# Patient Record
Sex: Female | Born: 1960 | Race: White | Hispanic: No | Marital: Married | Smoking: Never smoker
Health system: Southern US, Community
[De-identification: ages and names within clinical notes are randomized; demographics above are authoritative.]

## PROBLEM LIST (undated history)

## (undated) DIAGNOSIS — D510 Vitamin B12 deficiency anemia due to intrinsic factor deficiency: Secondary | ICD-10-CM

## (undated) DIAGNOSIS — F419 Anxiety disorder, unspecified: Secondary | ICD-10-CM

## (undated) DIAGNOSIS — R400 Somnolence: Secondary | ICD-10-CM

## (undated) DIAGNOSIS — E871 Hypo-osmolality and hyponatremia: Secondary | ICD-10-CM

## (undated) DIAGNOSIS — R569 Unspecified convulsions: Secondary | ICD-10-CM

## (undated) DIAGNOSIS — E162 Hypoglycemia, unspecified: Secondary | ICD-10-CM

## (undated) DIAGNOSIS — G4730 Sleep apnea, unspecified: Secondary | ICD-10-CM

## (undated) DIAGNOSIS — M1990 Unspecified osteoarthritis, unspecified site: Secondary | ICD-10-CM

## (undated) DIAGNOSIS — J310 Chronic rhinitis: Secondary | ICD-10-CM

## (undated) DIAGNOSIS — F329 Major depressive disorder, single episode, unspecified: Secondary | ICD-10-CM

## (undated) DIAGNOSIS — R269 Unspecified abnormalities of gait and mobility: Secondary | ICD-10-CM

## (undated) DIAGNOSIS — R0600 Dyspnea, unspecified: Secondary | ICD-10-CM

## (undated) DIAGNOSIS — K219 Gastro-esophageal reflux disease without esophagitis: Secondary | ICD-10-CM

## (undated) DIAGNOSIS — G43019 Migraine without aura, intractable, without status migrainosus: Secondary | ICD-10-CM

## (undated) DIAGNOSIS — Z9884 Bariatric surgery status: Secondary | ICD-10-CM

## (undated) DIAGNOSIS — E559 Vitamin D deficiency, unspecified: Secondary | ICD-10-CM

## (undated) DIAGNOSIS — F32A Depression, unspecified: Secondary | ICD-10-CM

## (undated) DIAGNOSIS — D500 Iron deficiency anemia secondary to blood loss (chronic): Secondary | ICD-10-CM

## (undated) DIAGNOSIS — E6609 Other obesity due to excess calories: Secondary | ICD-10-CM

## (undated) DIAGNOSIS — J189 Pneumonia, unspecified organism: Secondary | ICD-10-CM

## (undated) DIAGNOSIS — M797 Fibromyalgia: Secondary | ICD-10-CM

## (undated) DIAGNOSIS — K922 Gastrointestinal hemorrhage, unspecified: Secondary | ICD-10-CM

## (undated) DIAGNOSIS — I959 Hypotension, unspecified: Secondary | ICD-10-CM

## (undated) DIAGNOSIS — D509 Iron deficiency anemia, unspecified: Secondary | ICD-10-CM

## (undated) DIAGNOSIS — Z8742 Personal history of other diseases of the female genital tract: Secondary | ICD-10-CM

## (undated) DIAGNOSIS — E039 Hypothyroidism, unspecified: Secondary | ICD-10-CM

## (undated) DIAGNOSIS — I8393 Asymptomatic varicose veins of bilateral lower extremities: Secondary | ICD-10-CM

## (undated) HISTORY — PX: GASTRIC BYPASS: SHX52

## (undated) HISTORY — PX: DILATION AND CURETTAGE OF UTERUS: SHX78

## (undated) HISTORY — DX: Sleep apnea, unspecified: G47.30

## (undated) HISTORY — DX: Unspecified abnormalities of gait and mobility: R26.9

## (undated) HISTORY — DX: Unspecified osteoarthritis, unspecified site: M19.90

## (undated) HISTORY — DX: Somnolence: R40.0

## (undated) HISTORY — PX: CHOLECYSTECTOMY: SHX55

## (undated) HISTORY — DX: Hypothyroidism, unspecified: E03.9

## (undated) HISTORY — PX: MANDIBLE RECONSTRUCTION: SHX431

## (undated) HISTORY — DX: Migraine without aura, intractable, without status migrainosus: G43.019

## (undated) HISTORY — DX: Iron deficiency anemia secondary to blood loss (chronic): D50.0

## (undated) HISTORY — PX: TONSILLECTOMY: SUR1361

## (undated) HISTORY — PX: NASAL SINUS SURGERY: SHX719

## (undated) HISTORY — PX: SPINAL FUSION: SHX223

## (undated) HISTORY — PX: UVULECTOMY: SHX2631

## (undated) HISTORY — DX: Chronic rhinitis: J31.0

## (undated) HISTORY — DX: Fibromyalgia: M79.7

## (undated) HISTORY — DX: Iron deficiency anemia, unspecified: D50.9

## (undated) HISTORY — PX: TRACHEOSTOMY: SUR1362

## (undated) HISTORY — DX: Vitamin B12 deficiency anemia due to intrinsic factor deficiency: D51.0

## (undated) HISTORY — DX: Other obesity due to excess calories: E66.09

---

## 1998-08-05 ENCOUNTER — Ambulatory Visit (HOSPITAL_COMMUNITY): Admission: RE | Admit: 1998-08-05 | Discharge: 1998-08-05 | Payer: Self-pay | Admitting: *Deleted

## 1998-08-05 ENCOUNTER — Encounter (INDEPENDENT_AMBULATORY_CARE_PROVIDER_SITE_OTHER): Payer: Self-pay | Admitting: Specialist

## 1998-08-26 ENCOUNTER — Encounter: Payer: Self-pay | Admitting: Pulmonary Disease

## 1998-08-26 ENCOUNTER — Ambulatory Visit: Admission: RE | Admit: 1998-08-26 | Discharge: 1998-08-26 | Payer: Self-pay | Admitting: Pulmonary Disease

## 1999-02-18 ENCOUNTER — Encounter: Payer: Self-pay | Admitting: *Deleted

## 1999-02-19 ENCOUNTER — Inpatient Hospital Stay (HOSPITAL_COMMUNITY): Admission: RE | Admit: 1999-02-19 | Discharge: 1999-02-23 | Payer: Self-pay | Admitting: *Deleted

## 2000-01-02 ENCOUNTER — Emergency Department (HOSPITAL_COMMUNITY): Admission: EM | Admit: 2000-01-02 | Discharge: 2000-01-02 | Payer: Self-pay | Admitting: Emergency Medicine

## 2000-01-02 ENCOUNTER — Encounter: Payer: Self-pay | Admitting: Emergency Medicine

## 2000-03-28 ENCOUNTER — Encounter: Payer: Self-pay | Admitting: Otolaryngology

## 2000-03-30 ENCOUNTER — Inpatient Hospital Stay (HOSPITAL_COMMUNITY): Admission: RE | Admit: 2000-03-30 | Discharge: 2000-04-05 | Payer: Self-pay | Admitting: Otolaryngology

## 2001-05-04 ENCOUNTER — Ambulatory Visit (HOSPITAL_BASED_OUTPATIENT_CLINIC_OR_DEPARTMENT_OTHER): Admission: RE | Admit: 2001-05-04 | Discharge: 2001-05-04 | Payer: Self-pay | Admitting: Internal Medicine

## 2001-05-27 ENCOUNTER — Emergency Department (HOSPITAL_COMMUNITY): Admission: EM | Admit: 2001-05-27 | Discharge: 2001-05-27 | Payer: Self-pay | Admitting: Emergency Medicine

## 2001-09-18 ENCOUNTER — Ambulatory Visit (HOSPITAL_COMMUNITY): Admission: RE | Admit: 2001-09-18 | Discharge: 2001-09-18 | Payer: Self-pay | Admitting: Gastroenterology

## 2001-10-24 ENCOUNTER — Ambulatory Visit (HOSPITAL_COMMUNITY): Admission: RE | Admit: 2001-10-24 | Discharge: 2001-10-24 | Payer: Self-pay | Admitting: Gastroenterology

## 2002-05-22 ENCOUNTER — Encounter: Admission: RE | Admit: 2002-05-22 | Discharge: 2002-05-22 | Payer: Self-pay | Admitting: Rheumatology

## 2002-05-22 ENCOUNTER — Encounter: Payer: Self-pay | Admitting: Rheumatology

## 2002-07-22 ENCOUNTER — Ambulatory Visit (HOSPITAL_BASED_OUTPATIENT_CLINIC_OR_DEPARTMENT_OTHER): Admission: RE | Admit: 2002-07-22 | Discharge: 2002-07-22 | Payer: Self-pay | Admitting: Internal Medicine

## 2003-06-22 ENCOUNTER — Emergency Department (HOSPITAL_COMMUNITY): Admission: EM | Admit: 2003-06-22 | Discharge: 2003-06-22 | Payer: Self-pay | Admitting: Emergency Medicine

## 2003-08-04 ENCOUNTER — Ambulatory Visit (HOSPITAL_COMMUNITY): Admission: RE | Admit: 2003-08-04 | Discharge: 2003-08-04 | Payer: Self-pay | Admitting: Gastroenterology

## 2003-09-02 ENCOUNTER — Other Ambulatory Visit: Admission: RE | Admit: 2003-09-02 | Discharge: 2003-09-02 | Payer: Self-pay | Admitting: Family Medicine

## 2003-09-09 ENCOUNTER — Ambulatory Visit (HOSPITAL_COMMUNITY): Admission: RE | Admit: 2003-09-09 | Discharge: 2003-09-09 | Payer: Self-pay | Admitting: Family Medicine

## 2003-10-06 ENCOUNTER — Ambulatory Visit (HOSPITAL_COMMUNITY): Admission: RE | Admit: 2003-10-06 | Discharge: 2003-10-06 | Payer: Self-pay | Admitting: Gastroenterology

## 2003-11-03 ENCOUNTER — Emergency Department (HOSPITAL_COMMUNITY): Admission: EM | Admit: 2003-11-03 | Discharge: 2003-11-03 | Payer: Self-pay | Admitting: Emergency Medicine

## 2003-11-27 ENCOUNTER — Encounter: Admission: RE | Admit: 2003-11-27 | Discharge: 2003-11-27 | Payer: Self-pay | Admitting: Family Medicine

## 2003-11-28 ENCOUNTER — Ambulatory Visit: Payer: Self-pay | Admitting: Hematology & Oncology

## 2004-01-16 ENCOUNTER — Ambulatory Visit: Payer: Self-pay | Admitting: Hematology & Oncology

## 2004-03-08 ENCOUNTER — Ambulatory Visit: Payer: Self-pay | Admitting: Internal Medicine

## 2004-03-16 ENCOUNTER — Ambulatory Visit: Payer: Self-pay | Admitting: Hematology & Oncology

## 2004-05-03 ENCOUNTER — Ambulatory Visit: Payer: Self-pay | Admitting: Hematology & Oncology

## 2004-08-09 ENCOUNTER — Ambulatory Visit: Payer: Self-pay | Admitting: Internal Medicine

## 2004-08-17 ENCOUNTER — Ambulatory Visit: Payer: Self-pay | Admitting: Hematology & Oncology

## 2004-09-15 ENCOUNTER — Ambulatory Visit: Payer: Self-pay | Admitting: Internal Medicine

## 2004-11-16 ENCOUNTER — Ambulatory Visit: Payer: Self-pay | Admitting: Hematology & Oncology

## 2004-12-23 ENCOUNTER — Ambulatory Visit: Payer: Self-pay | Admitting: Internal Medicine

## 2005-01-25 ENCOUNTER — Ambulatory Visit: Payer: Self-pay | Admitting: Internal Medicine

## 2005-02-15 ENCOUNTER — Ambulatory Visit: Payer: Self-pay | Admitting: Hematology & Oncology

## 2005-02-24 ENCOUNTER — Ambulatory Visit: Payer: Self-pay | Admitting: Internal Medicine

## 2005-04-28 ENCOUNTER — Encounter: Admission: RE | Admit: 2005-04-28 | Discharge: 2005-04-28 | Payer: Self-pay | Admitting: Orthopedic Surgery

## 2005-05-16 ENCOUNTER — Ambulatory Visit: Payer: Self-pay | Admitting: Hematology & Oncology

## 2005-05-18 LAB — CBC WITH DIFFERENTIAL/PLATELET
Basophils Absolute: 0 10*3/uL (ref 0.0–0.1)
Eosinophils Absolute: 0.1 10*3/uL (ref 0.0–0.5)
HGB: 12.4 g/dL (ref 11.6–15.9)
MCV: 89.7 fL (ref 81.0–101.0)
NEUT#: 3.3 10*3/uL (ref 1.5–6.5)
RDW: 14.9 % — ABNORMAL HIGH (ref 11.3–14.5)
lymph#: 2.3 10*3/uL (ref 0.9–3.3)

## 2005-05-20 ENCOUNTER — Inpatient Hospital Stay (HOSPITAL_COMMUNITY): Admission: RE | Admit: 2005-05-20 | Discharge: 2005-05-23 | Payer: Self-pay | Admitting: Neurosurgery

## 2005-05-20 LAB — TRANSFERRIN RECEPTOR, SOLUABLE: Transferrin Receptor, Soluble: 4.5 mg/L — ABNORMAL HIGH (ref 1.9–4.4)

## 2005-05-24 ENCOUNTER — Ambulatory Visit (HOSPITAL_COMMUNITY): Admission: RE | Admit: 2005-05-24 | Discharge: 2005-05-24 | Payer: Self-pay | Admitting: Neurosurgery

## 2005-07-11 ENCOUNTER — Encounter: Admission: RE | Admit: 2005-07-11 | Discharge: 2005-07-11 | Payer: Self-pay | Admitting: Otolaryngology

## 2005-07-20 ENCOUNTER — Ambulatory Visit: Payer: Self-pay | Admitting: Hematology & Oncology

## 2005-08-17 LAB — CBC WITH DIFFERENTIAL/PLATELET
Basophils Absolute: 0 10*3/uL (ref 0.0–0.1)
Eosinophils Absolute: 0 10*3/uL (ref 0.0–0.5)
HGB: 13 g/dL (ref 11.6–15.9)
MONO#: 0.3 10*3/uL (ref 0.1–0.9)
NEUT#: 7.4 10*3/uL — ABNORMAL HIGH (ref 1.5–6.5)
RDW: 22.1 % — ABNORMAL HIGH (ref 11.3–14.5)
lymph#: 1.2 10*3/uL (ref 0.9–3.3)

## 2005-08-20 LAB — FERRITIN: Ferritin: 200 ng/mL (ref 10–291)

## 2005-10-06 ENCOUNTER — Emergency Department (HOSPITAL_COMMUNITY): Admission: EM | Admit: 2005-10-06 | Discharge: 2005-10-07 | Payer: Self-pay | Admitting: Emergency Medicine

## 2005-11-16 ENCOUNTER — Ambulatory Visit: Payer: Self-pay | Admitting: Hematology & Oncology

## 2005-11-23 ENCOUNTER — Encounter: Admission: RE | Admit: 2005-11-23 | Discharge: 2005-11-23 | Payer: Self-pay | Admitting: Orthopedic Surgery

## 2005-12-05 LAB — CBC & DIFF AND RETIC
Basophils Absolute: 0.1 10*3/uL (ref 0.0–0.1)
Eosinophils Absolute: 0.1 10*3/uL (ref 0.0–0.5)
HGB: 13 g/dL (ref 11.6–15.9)
LYMPH%: 24.2 % (ref 14.0–48.0)
MCH: 30.2 pg (ref 26.0–34.0)
MCV: 90.9 fL (ref 81.0–101.0)
MONO%: 5.6 % (ref 0.0–13.0)
NEUT#: 4.9 10*3/uL (ref 1.5–6.5)
Platelets: 287 10*3/uL (ref 145–400)
RBC: 4.3 10*6/uL (ref 3.70–5.32)

## 2005-12-07 LAB — FERRITIN: Ferritin: 12 ng/mL (ref 10–291)

## 2005-12-07 LAB — TRANSFERRIN RECEPTOR, SOLUABLE: Transferrin Receptor, Soluble: 4.6 mg/L — ABNORMAL HIGH (ref 1.9–4.4)

## 2005-12-14 ENCOUNTER — Ambulatory Visit: Payer: Self-pay | Admitting: Internal Medicine

## 2005-12-16 ENCOUNTER — Encounter: Admission: RE | Admit: 2005-12-16 | Discharge: 2005-12-16 | Payer: Self-pay | Admitting: Orthopedic Surgery

## 2005-12-22 ENCOUNTER — Inpatient Hospital Stay (HOSPITAL_COMMUNITY): Admission: EM | Admit: 2005-12-22 | Discharge: 2005-12-26 | Payer: Self-pay | Admitting: Internal Medicine

## 2005-12-22 ENCOUNTER — Encounter: Payer: Self-pay | Admitting: Emergency Medicine

## 2005-12-28 ENCOUNTER — Ambulatory Visit (HOSPITAL_COMMUNITY): Admission: RE | Admit: 2005-12-28 | Discharge: 2005-12-28 | Payer: Self-pay | Admitting: Gastroenterology

## 2006-01-04 ENCOUNTER — Inpatient Hospital Stay (HOSPITAL_COMMUNITY): Admission: EM | Admit: 2006-01-04 | Discharge: 2006-01-05 | Payer: Self-pay | Admitting: Emergency Medicine

## 2006-03-06 ENCOUNTER — Ambulatory Visit: Payer: Self-pay | Admitting: Hematology & Oncology

## 2006-04-06 LAB — CBC WITH DIFFERENTIAL/PLATELET
BASO%: 0 % (ref 0.0–2.0)
EOS%: 0 % (ref 0.0–7.0)
HCT: 32.2 % — ABNORMAL LOW (ref 34.8–46.6)
LYMPH%: 9.1 % — ABNORMAL LOW (ref 14.0–48.0)
MCH: 23.2 pg — ABNORMAL LOW (ref 26.0–34.0)
MCHC: 31.6 g/dL — ABNORMAL LOW (ref 32.0–36.0)
MONO#: 0.2 10*3/uL (ref 0.1–0.9)
NEUT%: 88.3 % — ABNORMAL HIGH (ref 39.6–76.8)
Platelets: 338 10*3/uL (ref 145–400)
RBC: 4.38 10*6/uL (ref 3.70–5.32)
WBC: 8.6 10*3/uL (ref 3.9–10.0)

## 2006-04-06 LAB — CHCC SMEAR

## 2006-05-05 ENCOUNTER — Encounter
Admission: RE | Admit: 2006-05-05 | Discharge: 2006-08-03 | Payer: Self-pay | Admitting: Physical Medicine & Rehabilitation

## 2006-05-09 ENCOUNTER — Ambulatory Visit: Payer: Self-pay | Admitting: Physical Medicine & Rehabilitation

## 2006-05-23 ENCOUNTER — Ambulatory Visit: Payer: Self-pay | Admitting: Hematology & Oncology

## 2006-05-25 LAB — CBC WITH DIFFERENTIAL/PLATELET
BASO%: 0.3 % (ref 0.0–2.0)
EOS%: 1.5 % (ref 0.0–7.0)
HCT: 36.4 % (ref 34.8–46.6)
LYMPH%: 18.9 % (ref 14.0–48.0)
MCH: 26.6 pg (ref 26.0–34.0)
MCHC: 32.5 g/dL (ref 32.0–36.0)
MCV: 82 fL (ref 81.0–101.0)
MONO#: 0.3 10*3/uL (ref 0.1–0.9)
MONO%: 4.1 % (ref 0.0–13.0)
NEUT%: 75.2 % (ref 39.6–76.8)
Platelets: 203 10*3/uL (ref 145–400)
RBC: 4.45 10*6/uL (ref 3.70–5.32)

## 2006-07-01 ENCOUNTER — Emergency Department (HOSPITAL_COMMUNITY): Admission: EM | Admit: 2006-07-01 | Discharge: 2006-07-01 | Payer: Self-pay | Admitting: Emergency Medicine

## 2006-08-22 ENCOUNTER — Ambulatory Visit: Payer: Self-pay | Admitting: Hematology & Oncology

## 2006-11-02 ENCOUNTER — Ambulatory Visit: Payer: Self-pay | Admitting: Internal Medicine

## 2007-02-06 ENCOUNTER — Encounter: Payer: Self-pay | Admitting: Internal Medicine

## 2007-10-22 ENCOUNTER — Telehealth (INDEPENDENT_AMBULATORY_CARE_PROVIDER_SITE_OTHER): Payer: Self-pay | Admitting: *Deleted

## 2007-11-12 ENCOUNTER — Ambulatory Visit: Payer: Self-pay | Admitting: Hematology & Oncology

## 2007-11-13 LAB — CBC WITH DIFFERENTIAL (CANCER CENTER ONLY)
BASO#: 0 10*3/uL (ref 0.0–0.2)
BASO%: 0.3 % (ref 0.0–2.0)
EOS%: 2.1 % (ref 0.0–7.0)
HCT: 30.7 % — ABNORMAL LOW (ref 34.8–46.6)
HGB: 10.2 g/dL — ABNORMAL LOW (ref 11.6–15.9)
MCH: 26.7 pg (ref 26.0–34.0)
MCHC: 33.2 g/dL (ref 32.0–36.0)
MONO%: 6.5 % (ref 0.0–13.0)
NEUT%: 60.4 % (ref 39.6–80.0)
RDW: 15 % — ABNORMAL HIGH (ref 10.5–14.6)

## 2007-11-13 LAB — RETICULOCYTES (CHCC): ABS Retic: 35.4 10*3/uL (ref 19.0–186.0)

## 2008-01-10 ENCOUNTER — Ambulatory Visit: Payer: Self-pay | Admitting: Internal Medicine

## 2008-01-10 DIAGNOSIS — G4733 Obstructive sleep apnea (adult) (pediatric): Secondary | ICD-10-CM

## 2008-01-10 DIAGNOSIS — E039 Hypothyroidism, unspecified: Secondary | ICD-10-CM

## 2008-01-10 DIAGNOSIS — E162 Hypoglycemia, unspecified: Secondary | ICD-10-CM

## 2008-01-10 DIAGNOSIS — R404 Transient alteration of awareness: Secondary | ICD-10-CM

## 2008-01-10 DIAGNOSIS — E669 Obesity, unspecified: Secondary | ICD-10-CM

## 2008-01-10 DIAGNOSIS — J310 Chronic rhinitis: Secondary | ICD-10-CM | POA: Insufficient documentation

## 2008-01-10 DIAGNOSIS — M797 Fibromyalgia: Secondary | ICD-10-CM | POA: Insufficient documentation

## 2008-01-10 DIAGNOSIS — M129 Arthropathy, unspecified: Secondary | ICD-10-CM

## 2008-01-23 ENCOUNTER — Ambulatory Visit: Payer: Self-pay | Admitting: Hematology & Oncology

## 2008-02-06 LAB — CBC WITH DIFFERENTIAL (CANCER CENTER ONLY)
BASO#: 0 10*3/uL (ref 0.0–0.2)
EOS%: 1.4 % (ref 0.0–7.0)
HCT: 37.4 % (ref 34.8–46.6)
HGB: 12.3 g/dL (ref 11.6–15.9)
LYMPH#: 1.4 10*3/uL (ref 0.9–3.3)
LYMPH%: 19.6 % (ref 14.0–48.0)
MCHC: 32.8 g/dL (ref 32.0–36.0)
MCV: 92 fL (ref 81–101)
MONO#: 0.3 10*3/uL (ref 0.1–0.9)
NEUT%: 74.8 % (ref 39.6–80.0)
RDW: 13.8 % (ref 10.5–14.6)

## 2008-02-12 ENCOUNTER — Other Ambulatory Visit: Admission: RE | Admit: 2008-02-12 | Discharge: 2008-02-12 | Payer: Self-pay | Admitting: Family Medicine

## 2008-04-15 ENCOUNTER — Ambulatory Visit: Payer: Self-pay | Admitting: Hematology & Oncology

## 2008-06-23 ENCOUNTER — Encounter: Admission: RE | Admit: 2008-06-23 | Discharge: 2008-06-23 | Payer: Self-pay | Admitting: Orthopaedic Surgery

## 2008-07-23 ENCOUNTER — Ambulatory Visit: Payer: Self-pay | Admitting: Hematology & Oncology

## 2008-07-24 LAB — RETICULOCYTES (CHCC)
ABS Retic: 43.2 10*3/uL (ref 19.0–186.0)
RBC.: 3.6 MIL/uL — ABNORMAL LOW (ref 3.87–5.11)

## 2008-07-24 LAB — CBC WITH DIFFERENTIAL (CANCER CENTER ONLY)
BASO#: 0 10*3/uL (ref 0.0–0.2)
BASO%: 0.3 % (ref 0.0–2.0)
Eosinophils Absolute: 0.1 10*3/uL (ref 0.0–0.5)
HCT: 30.9 % — ABNORMAL LOW (ref 34.8–46.6)
HGB: 9.9 g/dL — ABNORMAL LOW (ref 11.6–15.9)
LYMPH#: 2.1 10*3/uL (ref 0.9–3.3)
LYMPH%: 33.9 % (ref 14.0–48.0)
MCV: 84 fL (ref 81–101)
MONO#: 0.3 10*3/uL (ref 0.1–0.9)
NEUT%: 59.3 % (ref 39.6–80.0)
RBC: 3.69 10*6/uL — ABNORMAL LOW (ref 3.70–5.32)
RDW: 16.3 % — ABNORMAL HIGH (ref 10.5–14.6)
WBC: 6.3 10*3/uL (ref 3.9–10.0)

## 2008-07-24 LAB — CHCC SATELLITE - SMEAR

## 2008-08-01 ENCOUNTER — Emergency Department (HOSPITAL_COMMUNITY): Admission: EM | Admit: 2008-08-01 | Discharge: 2008-08-01 | Payer: Self-pay | Admitting: Emergency Medicine

## 2008-08-22 ENCOUNTER — Ambulatory Visit: Payer: Self-pay | Admitting: Hematology & Oncology

## 2008-08-25 LAB — CBC WITH DIFFERENTIAL (CANCER CENTER ONLY)
BASO%: 0.4 % (ref 0.0–2.0)
Eosinophils Absolute: 0.1 10*3/uL (ref 0.0–0.5)
LYMPH#: 1.8 10*3/uL (ref 0.9–3.3)
LYMPH%: 33.8 % (ref 14.0–48.0)
MCV: 90 fL (ref 81–101)
MONO#: 0.3 10*3/uL (ref 0.1–0.9)
NEUT#: 3.1 10*3/uL (ref 1.5–6.5)
Platelets: 165 10*3/uL (ref 145–400)
RBC: 3.86 10*6/uL (ref 3.70–5.32)
RDW: 17.4 % — ABNORMAL HIGH (ref 10.5–14.6)
WBC: 5.4 10*3/uL (ref 3.9–10.0)

## 2008-08-25 LAB — CHCC SATELLITE - SMEAR

## 2008-08-25 LAB — FERRITIN: Ferritin: 58 ng/mL (ref 10–291)

## 2008-10-03 ENCOUNTER — Ambulatory Visit: Payer: Self-pay | Admitting: Hematology & Oncology

## 2008-10-06 LAB — CBC WITH DIFFERENTIAL (CANCER CENTER ONLY)
BASO%: 0.4 % (ref 0.0–2.0)
Eosinophils Absolute: 0.1 10*3/uL (ref 0.0–0.5)
LYMPH%: 34.7 % (ref 14.0–48.0)
MCH: 29.9 pg (ref 26.0–34.0)
MCV: 89 fL (ref 81–101)
MONO%: 6.1 % (ref 0.0–13.0)
NEUT#: 1.9 10*3/uL (ref 1.5–6.5)
Platelets: 122 10*3/uL — ABNORMAL LOW (ref 145–400)
RBC: 4.04 10*6/uL (ref 3.70–5.32)
RDW: 13.3 % (ref 10.5–14.6)
WBC: 3.4 10*3/uL — ABNORMAL LOW (ref 3.9–10.0)

## 2008-10-06 LAB — RETICULOCYTES (CHCC)
RBC.: 4.15 MIL/uL (ref 3.87–5.11)
Retic Ct Pct: 0.6 % (ref 0.4–3.1)

## 2008-11-03 ENCOUNTER — Ambulatory Visit: Payer: Self-pay | Admitting: Hematology & Oncology

## 2009-06-25 ENCOUNTER — Ambulatory Visit: Payer: Self-pay | Admitting: Hematology & Oncology

## 2009-08-07 ENCOUNTER — Ambulatory Visit: Payer: Self-pay | Admitting: Hematology & Oncology

## 2009-08-10 LAB — CBC WITH DIFFERENTIAL (CANCER CENTER ONLY)
BASO#: 0 10*3/uL (ref 0.0–0.2)
Eosinophils Absolute: 0.1 10*3/uL (ref 0.0–0.5)
HCT: 30.9 % — ABNORMAL LOW (ref 34.8–46.6)
HGB: 10.2 g/dL — ABNORMAL LOW (ref 11.6–15.9)
LYMPH%: 30.5 % (ref 14.0–48.0)
MCH: 26.5 pg (ref 26.0–34.0)
MCV: 80 fL — ABNORMAL LOW (ref 81–101)
MONO#: 0.2 10*3/uL (ref 0.1–0.9)
MONO%: 5.6 % (ref 0.0–13.0)
NEUT%: 60.5 % (ref 39.6–80.0)
RBC: 3.85 10*6/uL (ref 3.70–5.32)
WBC: 4.2 10*3/uL (ref 3.9–10.0)

## 2009-08-10 LAB — RETICULOCYTES (CHCC)
ABS Retic: 11.7 10*3/uL — ABNORMAL LOW (ref 19.0–186.0)
RBC.: 3.89 MIL/uL (ref 3.87–5.11)

## 2009-10-01 ENCOUNTER — Ambulatory Visit: Payer: Self-pay | Admitting: Hematology & Oncology

## 2009-10-02 LAB — CBC WITH DIFFERENTIAL (CANCER CENTER ONLY)
BASO#: 0 10*3/uL (ref 0.0–0.2)
BASO%: 0.3 % (ref 0.0–2.0)
EOS%: 3.1 % (ref 0.0–7.0)
Eosinophils Absolute: 0.1 10*3/uL (ref 0.0–0.5)
HCT: 33.1 % — ABNORMAL LOW (ref 34.8–46.6)
HGB: 10.7 g/dL — ABNORMAL LOW (ref 11.6–15.9)
LYMPH#: 1.3 10*3/uL (ref 0.9–3.3)
LYMPH%: 34 % (ref 14.0–48.0)
MCH: 28.4 pg (ref 26.0–34.0)
MCHC: 32.3 g/dL (ref 32.0–36.0)
MCV: 88 fL (ref 81–101)
MONO#: 0.2 10*3/uL (ref 0.1–0.9)
MONO%: 5.2 % (ref 0.0–13.0)
NEUT#: 2.2 10*3/uL (ref 1.5–6.5)
NEUT%: 57.4 % (ref 39.6–80.0)
Platelets: 162 10*3/uL (ref 145–400)
RBC: 3.77 10*6/uL (ref 3.70–5.32)
RDW: 16.6 % — ABNORMAL HIGH (ref 10.5–14.6)
WBC: 3.9 10*3/uL (ref 3.9–10.0)

## 2009-10-02 LAB — RETICULOCYTES (CHCC)
ABS Retic: 22.7 10*3/uL (ref 19.0–186.0)
RBC.: 3.79 MIL/uL — ABNORMAL LOW (ref 3.87–5.11)
Retic Ct Pct: 0.6 % (ref 0.4–3.1)

## 2009-10-02 LAB — CHCC SATELLITE - SMEAR

## 2009-11-11 ENCOUNTER — Ambulatory Visit (HOSPITAL_BASED_OUTPATIENT_CLINIC_OR_DEPARTMENT_OTHER): Payer: Federal, State, Local not specified - PPO | Admitting: Hematology & Oncology

## 2009-11-12 LAB — CBC WITH DIFFERENTIAL (CANCER CENTER ONLY)
BASO#: 0 10*3/uL (ref 0.0–0.2)
BASO%: 0.2 % (ref 0.0–2.0)
EOS%: 1.4 % (ref 0.0–7.0)
HCT: 35.9 % (ref 34.8–46.6)
HGB: 11.7 g/dL (ref 11.6–15.9)
LYMPH#: 0.9 10*3/uL (ref 0.9–3.3)
MCHC: 32.6 g/dL (ref 32.0–36.0)
MONO#: 0.2 10*3/uL (ref 0.1–0.9)
NEUT#: 3.5 10*3/uL (ref 1.5–6.5)
NEUT%: 75.7 % (ref 39.6–80.0)
WBC: 4.6 10*3/uL (ref 3.9–10.0)

## 2009-11-12 LAB — VITAMIN B12: Vitamin B-12: >2000 pg/mL — ABNORMAL HIGH (ref 211–911)

## 2010-01-31 ENCOUNTER — Encounter: Payer: Self-pay | Admitting: Orthopedic Surgery

## 2010-01-31 ENCOUNTER — Encounter: Payer: Self-pay | Admitting: Gastroenterology

## 2010-03-18 ENCOUNTER — Encounter (HOSPITAL_BASED_OUTPATIENT_CLINIC_OR_DEPARTMENT_OTHER): Payer: Federal, State, Local not specified - PPO | Admitting: Hematology & Oncology

## 2010-03-18 DIAGNOSIS — D509 Iron deficiency anemia, unspecified: Secondary | ICD-10-CM

## 2010-03-18 LAB — RETICULOCYTES (CHCC)
RBC.: 4.07 MIL/uL (ref 3.87–5.11)
Retic Ct Pct: 0.7 % (ref 0.4–3.1)

## 2010-03-18 LAB — VITAMIN B12: Vitamin B-12: 370 pg/mL (ref 211–911)

## 2010-03-18 LAB — CBC WITH DIFFERENTIAL (CANCER CENTER ONLY)
BASO%: 0.8 % (ref 0.0–2.0)
EOS%: 2.3 % (ref 0.0–7.0)
LYMPH#: 1.4 10*3/uL (ref 0.9–3.3)
MONO#: 0.3 10*3/uL (ref 0.1–0.9)
Platelets: 132 10*3/uL — ABNORMAL LOW (ref 145–400)
RDW: 12.8 % (ref 11.1–15.7)
WBC: 3.8 10*3/uL — ABNORMAL LOW (ref 3.9–10.0)

## 2010-05-25 NOTE — Procedures (Signed)
Valerie Silva, Valerie Silva NO.:  [id number]   MEDICAL RECORD NO.:  [id number]          PATIENT TYPE:  REC   LOCATION:  TPC                          FACILITY:  [facility]   PHYSICIAN:  Erick Colace, M.D.DATE OF BIRTH:  04-04-1960   DATE OF PROCEDURE:  05/15/2006  DATE OF DISCHARGE:                               OPERATIVE REPORT   This is acupuncture treatment for thoracic pain unresponsive to  medication management.  Has T11-12 disk, intermittent right greater than  left T12 radicular discomfort.   Patient placed in the left lateral decubitus position.  A 50 mm  acupuncture needle was placed 3 cm lateral spinous processes of T12,  T10, T8, T6 and T4.  Electrical stimulation 4 hertz x30 minutes.  Patient tolerated procedure well.  Return on May 23, 2006.      Erick Colace, M.D.  Electronically Signed     AEK/MEDQ  D:  05/15/2006 14:25:19  T:  05/15/2006 17:26:15  Job:  [id number]

## 2010-05-25 NOTE — Procedures (Signed)
Valerie Silva, Valerie Silva NO.:  [id number]   MEDICAL RECORD NO.:  [id number]          PATIENT TYPE:  REC   LOCATION:  TPC                          FACILITY:  [facility]   PHYSICIAN:  Erick Colace, M.D.DATE OF BIRTH:  03/01/1960   DATE OF PROCEDURE:  05/23/2006  DATE OF DISCHARGE:                               OPERATIVE REPORT   This is acupuncture treatment number two.   Last treatment May 15, 2006.  No adverse effects.  She did have some  minimal post procedure flare up which I had explained was quite common.  Had a history of T11-12 disk.   50 mm acupuncture needle placed 3 cm lateral spinal process of T12, T10,  T8, T6, T4.  Electrical stimulation 4 Hz x 30 minutes.  The patient  tolerated the procedure well.  Return in two days.  Repeat treatment.      Erick Colace, M.D.  Electronically Signed     AEK/MEDQ  D:  05/23/2006 17:13:59  T:  05/24/2006 06:03:59  Job:  [id number]

## 2010-05-25 NOTE — Assessment & Plan Note (Signed)
[facility]                             PULMONARY OFFICE NOTE   Valerie Silva, Valerie Silva                       MRN:          [id number]  DATE:11/02/2006                            DOB:          January 30, 1960    PULMONARY FOLLOWUP   PROBLEMS:  1. Sleep apnea/palatoplasty.  2. Excessive daytime somnolence.  3. Exogenous obesity.  4. Sleep disturbance from arthritis pain.  5. Rhinitis.  6. Remote tracheostomy.  7. Hypothyroidism.  8. Diabetes.  9. Fibromyalgia.   HISTORY:  She spent much of the recent months in bed dealing with back  pain.  Main complaint today is of constant watery rhinorrhea with  limited sneezing.  Sometimes, she feels unable to take comfortable deep  breaths.  She had had this problem in the past and says Dr. Shelle Iron put  her through a great many tests and found nothing.  I discussed chest  wall restriction related to her arthritis and her inactivity, and she  also recognizes that she has been under a lot of stress that she feels  contributes to this dyspnea sensation.  She has not had chest pain,  palpitations, cough, or phlegm.   MEDICATIONS:  1. Wellbutrin XL 300 mg.  2. Synthroid.  3. Glucosamine.  4. Nasacort.  5. Concerta 30 mg up to t.i.d. p.r.n.  6. Lyrica 100 mg b.i.d.  7. Zoloft 100 mg.  8. Dolobid b.i.d.  9. Oxycodone 15 mg q.4h p.r.n.   DRUG INTOLERANCES:  CODEINE.  IODINE.  PENICILLIN.  SULFA.  LOTENSIN.  IRON.   OBJECTIVE:  Weight 305 pounds, BP 114/70, pulse 78, room air saturation  97%.  She is obese, calm, and unlabored.  Lung fields are distinctly clear with appropriate depth of excursion.  No accessory muscle use, neck vein distension, or stridor.  Heart sounds are regular without murmur or gallop.  There is no edema or  cyanosis.   IMPRESSION:  1. Obstructive sleep apnea with insomnia.  2. Sleep disturbance by back pain.  3. Rhinorrhea.  4. Dyspnea reflecting need to stretch.   PLAN:  1. We  refilled Nasacort.  2. Try samples of Clarinex, which she can then replace with over-the-      counter claritin if helpful.  3. I have suggested she blow up balloons.  Walk as tolerated.  Take an      occasional deep breath.  4. We have refilled Concerta 36 mg t.i.d. p.r.n. with medication talk.  5. Flu vaccine discussed and given.  6. Schedule return in 1 year, earlier p.r.n.     Clinton D. Maple Hudson, MD, Tonny Bollman, FACP  Electronically Signed    CDY/MedQ  DD: 11/02/2006  DT: 11/03/2006  Job #: [id number]   cc:   Stacie Acres. Cliffton Asters, M.D.  Hilda Lias, M.D.  Kathrin Penner. Vear Clock, M.D.

## 2010-05-25 NOTE — Assessment & Plan Note (Signed)
Valerie Silva follows up today.  The pain is 9/10, involving her entire  back area, also bilateral hips, shoulders, knees, ankles, elbows and  hands.  Walks 10-15 minutes at a time.  Drives.  Climbs steps.  Needs  some assistance with meal prep, household duties, shopping.   REVIEW OF SYSTEMS:  Positive for anxiety and depression but negative for  suicidal thoughts.   PHYSICAL EXAMINATION:  VITAL SIGNS:  O2 saturation 96% on room air.  GENERAL:  She is alert and oriented.  Affect is bright.  MUSCULOSKELETAL/NEUROLOGIC:  She has tenderness in the thoracic  paraspinals.   Reviewed her urine drug screen with her.  She had consistent results  with the oxycodone with a metabolite of oxymorphone.  No other opiates  were found.  Benzodiazepines listed as positive; however, oxaprozin  which the patient has been taking can cause immunoassay positive.   I discussed the patient's pain pattern, 24-hour symptoms, previous  better relief with OxyContin rather than oxycodone immediate release.  Will switch to oxymorphone with a 50% equal analgesic dose to give a  safety margin for limited cross-tolerance.  May need to titrate upward  from there or add Opana IR 5 mg b.i.d. for breakthrough.   I will see her back, having given her a 2-week supply, and may need to  go at the 2-week time frame.      Erick Colace, M.D.  Electronically Signed     AEK/MedQ  D:  05/23/2006 17:17:47  T:  05/24/2006 06:45:05  Job #:  [id number]

## 2010-05-25 NOTE — Procedures (Signed)
Valerie Silva, Valerie Silva                ACCOUNT NO.:  [id number]   MEDICAL RECORD NO.:  [id number]          PATIENT TYPE:  OUT   LOCATION:  [facility]                          FACILITY:  [facility]   PHYSICIAN:  Erick Colace, M.D.DATE OF BIRTH:  06/07/1960   DATE OF PROCEDURE:  DATE OF DISCHARGE:                               OPERATIVE REPORT   Needles placed on the right at points BL-11, BL-13, BL-15, BL-17, BL-19  as well as BL-41, BL-43, BL-45, BL-47, BL-49 as well as BL-21, SP-21, BL-  22, and GB-25.   Electrical stimulation of 4 Hz x30 minutes.  Patient tolerated the  procedure well.   Was switched from oxycodone to Opana ER with 50% dosage reduction.  Having some breakthrough pain.  Will prescribe oxycodone 15 mg p.o.  b.i.d.      Erick Colace, M.D.  Electronically Signed     AEK/MEDQ  D:  05/25/2006 15:44:50  T:  05/25/2006 17:08:16  Job:  [id number]

## 2010-05-25 NOTE — Group Therapy Note (Signed)
REASON FOR CONSULTATION:  Initially requested as acupuncture but per  handwritten note also to include medical pain management. Patient with  chronic pain in the cervical, thoracic, and lumbar spine, as well as  fibromyalgia pain.   A 50 year old female who relates a history of neck pain extending  greater than 10 years as well as back pain extending greater than 10  years. She had a fall in January of 1994 on the ice. She initially was  treated primarily with Chiropractic, tried a few acupuncture treatments  about 20 years ago. Seems to recall about 5 to 10 treatments. She has  tried physical therapy in the past, most recently following a cervical  fusion in 2007. She gives her average pain as 9 to 10 out of 10. Her  most problematic area is her mid back area which she states has been  particularly flared up over the last year or so. She has had an MRI of  the thoracic spine without contrast demonstrating a disc bulge at T11-12  more so to the right than the left side. No significant cord compression  was noted. She has been managed with oxycodone 15 mg taking 4 to 6  tablets per day as per last note from Dr. Charlett Blake.  This was reported as  3 to 4 times a day. In addition she has been on;  1. Daypro.  2. Skelaxin 800 t.i.d.  3. Neurontin 300 t.i.d.  4. Cymbalta 60 mg daily.  5. Wellbutrin XL 300 mg per day.  6. Maxalt 10 mg p.r.n.  7. She is on Concerta 36 mg for her sleep apnea and what sounds like      narcolepsy.  8. In addition to Synthroid.   PAST MEDICAL HISTORY:  Is quite complicated; she has had gastric bypass  surgery in 1999, panniculectomy which was done by Dr. Desmond Dike  February 23, 1999, tracheostomy was placed for obstructive sleep apnea  March 30, 2000 per Dr. Pollyann Kennedy, EGD demonstrated gastric polyps and  obturation at her gastric bypass anastomosis on December 18, 2001.  Colonoscopy October 24, 2001, showed internal bleeding hemorrhoids;  repeat in 2005 showed that  as well as __________ mid transverse colon,  repeat later in 2005 internal hemorrhoids, May 20, 2005 C4-5, C5-6  decompression foraminotomy interbody fusion autograft plating per Dr.  Jeral Fruit in [place], she had physical therapy postoperatively, ED visit  September 2007 for back pain, diagnosed by Dr. Corliss Skains with  fibromyalgia sometime in the last year, ED visit with rectal bleeding  December 22, 2005, admitted December 22, 2005 with melena and anemia,  repeat colonoscopy per Dr. Virginia Rochester poor prep, no active bleeding, small  bowel bleeding was noted.   She has a past history of plantar fasciitis 1996. Other past surgical  history, cholecystectomy 1975, D&C 1992, C-Section 1993, right knee  surgery 1993, plantar fasciitis surgery 1996, tonsillectomy, and  septoplasty, palatoplasty 1998.   She has sleep apnea and intolerance of CPAP.   Sleep is poor. Pain is worse with walking, bending, standing, some other  activities.  Rest is helping the pain, medication helps. Relief from  meds is fair. She can walk 5 to 20 minutes at a time, climb steps, and  drives. Uses electric carts at stores.   REVIEW OF SYSTEMS:  Positive for numbness, tremors, tingling, trouble  walking, dizziness, depression, night sweats, easy bleeding, low blood  sugar, diarrhea, nausea, occasional problem urinating, abdominal pain,  poor appetite, limb swelling.   SOCIAL HISTORY:  Married, lives with spouse and child. Accompanied here  by her mother.   FAMILY HISTORY:  Heart disease, diabetes, high blood pressure, cancer.   OTHER TREATMENTS TRIED:  She states that she tried Fentanyl patch when  she was seeing Dr. Vear Clock, has not returned to Dr. Vear Clock, stated  that she did not get along with his PA.   Her blood pressure is 144/75, pulse 79, respirations 15, oxygen  saturation 98% on room air.   Morbidly obese female in no acute distress. Alert and oriented times  three.  Affect alert, gait is normal. Palpation  for fibromyalgia 10  point reveals bilateral upper traps, bilateral sternal costal, bilateral  medial knee, bilateral hip.   Sensation reduced in right hand versus left hand but non dermatomal in  nature. Sensation in tact bilateral lower extremities, she has some  moderately decreased sensation in T11-12 dermatomes in the right side  only in the abdominal area and over the back.   Her back has no signs of scoliosis.  NECK: Has 50% range of motion flexion/extension lateral rotation and  bending.  Her strength in the upper and lower extremities is normal, is able to  toe walk and heel walk. She has no evidence of spasticity in lower  extremities. Negative Babinski.   IMPRESSION:  1. Chronic pain syndrome multi factorial. Her thoracic pain is      effecting her most functionally at this time. She does have signs      of right thoracic radiculopathy but no evidence of myelopathy. She      is hoping that surgery can help her back pain and although we did      discuss that the surgery may or may not help and the rest of her      pain complaints would not be effected.   1. Fibromyalgia syndrome accounting for her shoulder pain, her hip      pain, and some of her knee pain.   1. Cervical post laminectomy syndrome.   PLAN:  1. Trial of acupuncture for her thoracic pain and radicular symptoms.      We can incorporate some treatment for fibromyalgia as well.  2. We will need to get urine drug screen to see if she has any illicit      drugs or non reported other opiates on board prior to assuming      control of the substance management. If UDS checks out negative      except for expected drugs would consider oxymorphone trial.   I will see her back in 1 to 2 weeks for acupuncture and follow up on UDS  results. Thank you for this very interesting consultation, consult  requested by Dr. Lunette Stands.      Erick Colace, M.D.  Electronically Signed     AEK/MedQ D:   05/09/2006 16:35:59  T:  05/09/2006 18:12:08  Job #:  [id number]   cc:   Lunette Stands, M.D.  Fax: [phone]

## 2010-05-27 ENCOUNTER — Other Ambulatory Visit: Payer: Self-pay | Admitting: Hematology & Oncology

## 2010-05-27 ENCOUNTER — Encounter (HOSPITAL_BASED_OUTPATIENT_CLINIC_OR_DEPARTMENT_OTHER): Payer: Federal, State, Local not specified - PPO | Admitting: Hematology & Oncology

## 2010-05-27 DIAGNOSIS — D509 Iron deficiency anemia, unspecified: Secondary | ICD-10-CM

## 2010-05-27 DIAGNOSIS — Z9884 Bariatric surgery status: Secondary | ICD-10-CM

## 2010-05-27 DIAGNOSIS — D696 Thrombocytopenia, unspecified: Secondary | ICD-10-CM

## 2010-05-27 LAB — CBC WITH DIFFERENTIAL (CANCER CENTER ONLY)
BASO%: 0.5 % (ref 0.0–2.0)
HCT: 34.3 % — ABNORMAL LOW (ref 34.8–46.6)
HGB: 11.5 g/dL — ABNORMAL LOW (ref 11.6–15.9)
LYMPH#: 1.4 10*3/uL (ref 0.9–3.3)
MONO#: 0.2 10*3/uL (ref 0.1–0.9)
NEUT#: 2.4 10*3/uL (ref 1.5–6.5)
NEUT%: 58.5 % (ref 39.6–80.0)
RDW: 12.7 % (ref 11.1–15.7)
WBC: 4.1 10*3/uL (ref 3.9–10.0)

## 2010-05-27 LAB — FERRITIN: Ferritin: 60 ng/mL (ref 10–291)

## 2010-05-27 LAB — IRON AND TIBC
Iron: 81 ug/dL (ref 42–145)
TIBC: 289 ug/dL (ref 250–470)

## 2010-05-27 LAB — RETICULOCYTES (CHCC): ABS Retic: 19.9 10*3/uL (ref 19.0–186.0)

## 2010-05-28 NOTE — Consult Note (Signed)
Valerie Silva, Valerie Silva NO.:  [id number]   MEDICAL RECORD NO.:  [id number]          PATIENT TYPE:  INP   LOCATION:  0104                         FACILITY:  [facility]   PHYSICIAN:  Jordan Hawks. Elnoria Howard, MD    DATE OF BIRTH:  [birth year]   DATE OF CONSULTATION:  12/22/2005  DATE OF DISCHARGE:                                 CONSULTATION   REASON FOR CONSULTATION:  Melena and anemia as well as epigastric pain.   PRIMARY CARE Ceilidh Torregrossa:  Dr. Laurann Montana.  Rheumatologist, Dr. Kathryne Hitch.   HISTORY OF PRESENT ILLNESS:  This is a 50 year old female with a past  medical history of morbid obesity, status post gastric bypass in 1997,  and in 2003, cervical degenerative disc disease, status post neck  fusion, obstructive sleep apnea, status post uvular resection, status  post tracheostomy, diabetes, fibromyalgia and hypothyroidism who  presents to the emergency room with complaints of epigastric pain as  well as melena.  The patient reports that last evening she started to  experience worsening of her arthritis type of pain and she subsequently  took a pain medication.  The pain had radiated to the epigastric region  which she says is common when her shoulder pain is at its most severe  intensely.  She subsequently woke up at approximately 3 a.m. and used  the restroom.  At that time, she reported having a large melenic stool.  The patient subsequently went back to sleep, but over the course of the  intervening night, she continued to have several more bowel movements  which were all black in color, but continued to lighten up over nine  over the duration of these over the as excessive bowel movements.  The  patient denies having any prior melena before, however, she reports  having a prior peptic ulcer disease noted by Dr. Loreta Ave in the past.  She  also underwent a colonoscopy by Dr. Loreta Ave approximately 3 years ago.  She  has a family history significant for colon cancer both  on her maternal  and paternal side as well as her brother who died of the disease at the  age of 50.  The patient was encouraged by her mother to present to the  emergency room and subsequently, she was noted to be mildly tachycardiac  at 112 beats per minute and with a hemoglobin of 10.6.  The patient does  have a history of chronic iron-deficiency anemia which is secondary to  her gastric bypass.  However, she is not able to tolerate p.o. iron and  subsequently receives IV iron through Dr. [name].  The patient denies  using any types of NSAIDs since her previous endoscopy, however, she  does use the medication Daypro at this time.   PAST MEDICAL AND SURGICAL HISTORY:  As stated above.   FAMILY HISTORY:  Significant for colon cancer.   HOME MEDICATIONS:  Wellbutrin, Synthroid, multivitamins, glucosamine,  Daypro, Cymbalta, metformin, Nasacort, Neurontin, Demerol and oxycodone.   ALLERGIES:  CODEINE, IODINE, PENICILLIN, LOTENSIN AND IRON.   REVIEW OF SYSTEMS:  As per the history  of present illness.  Positive for  chronic pain.  No no chest pain or shortness of breath.  No fevers or  chills.  No diarrhea or constipation.  No dysphagia, dysarthria or  blurry vision.  No myalgias.  Positive for arthritis.  No skin rashes.  No neurologic complaints.  No psychiatric complaints at this time.   PHYSICAL EXAMINATION:  VITAL SIGNS:  Stable.  GENERAL:  The patient is in no acute distress, alert and oriented.  HEENT:  Normocephalic, atraumatic.  Extraocular muscles intact.  Pupils  equal, round, reactive to light.  NECK:  Neck is supple.  No lymphadenopathy.  LUNGS:  Clear to auscultation bilaterally.  CARDIOVASCULAR:  Regular rate and rhythm.  ABDOMEN:  Abdomen is obese, soft, tender in the epigastric region as  well as the left upper quadrant and mid left side.  No rebound or  rigidity.  Positive bowel sounds.  EXTREMITIES:  No clubbing, cyanosis or edema.   LABORATORY DATA AND X-RAY  FINDINGS:  White blood cell count is 11.1,  hemoglobin 10.6, MCV is 91.7, platelets at 299.  Sodium was 141,  potassium 3.8, chloride 106, CO2 26, glucose 107, BUN 30, creatinine  0.8.  Total bilirubin 0.5, Alk phos is 46, AST is 24, ALT 28, albumin  3.4.   IMPRESSION:  1. Epigastric pain.  2. Melena.  3. Morbid obesity.  4. Obstructive sleep apnea.  5. Arthritis.   After evaluation of the patient, I suspect that she has an anastomotic  ulcer.  This is a common type of occurrence with the gastric bypass  surgeries.  Further evaluation with an EGD will be required at this  time.  She is mildly tachycardia and I suspect that she is slightly  volume depleted.  It is most likely that her hemoglobin will decrease  with IV hydration.  Subsequently, she will most likely require  transfusion.   RECOMMENDATIONS:  1. EGD tomorrow.  2. Protonix b.i.d.  3. Sucralfate 2 g one p.o. b.i.d.  4. Follow her H&H and transfuse as necessary.      Jordan Hawks Elnoria Howard, MD  Electronically Signed     PDH/MEDQ  D:  12/22/2005  T:  12/22/2005  Job:  [id number]   cc:   Rose Phi. [name], M.D.  Fax: [phone]   Hilda Lias, M.D.  Fax: [phone]

## 2010-05-28 NOTE — H&P (Signed)
NAMEKAFI, DOTTER NO.:  [id number]   MEDICAL RECORD NO.:  [id number]          PATIENT TYPE:  INP   LOCATION:  1829                         FACILITY:  [facility]   PHYSICIAN:  Jordan Hawks. Elnoria Howard, MD    DATE OF BIRTH:  18-Jun-1960   DATE OF ADMISSION:  01/04/2006  DATE OF DISCHARGE:                              HISTORY & PHYSICAL   REASON FOR ADMISSION:  Weakness and intermittent hematochezia.   PRIMARY CARE Dae Antonucci:  Stacie Acres. Cliffton Asters, M.D.   HISTORY OF PRESENT ILLNESS:  This is a 50 year old female with a past  medical history of hypothyroidism, chronic back pain, diabetes,  peripheral neuropathy, depression, hypothyroidism, morbid obesity,  status post two gastric bypasses who is admitted for complaints of  weakness and presyncope, as well as intermittent hematochezia.  The  patient is well-known to me from her recent admission approximately 1  week ago.  The patient previously was admitted on December 22, 2005,  with complaints of hematochezia.  At that time, she was noted to have a  decrease in her hemoglobin of approximately in the 7 range.  She  reported a one to two-day day episode of melena at that time and was  prompted by her mother to present to the emergency room for further  evaluation and treatment.  She underwent an EGD and colonoscopy with  negative findings and a capsule endoscopy was performed as an outpatient  after she received transfusions and demonstrated stability in her  hemoglobin.  In what was presumed to be in the distal small bowel, the  patient was noted to have an active bleeding site.  It was difficult to  discern the exact etiology that is an ulceration with a visible vessel  or simply an ulceration with continued bleeding.  Further evaluation of  this bleeding site was arranged.  She was to have a Meckel scan as an  outpatient.  Unfortunately, she reports having complaints of dizziness  and weakness.  She was subsequently instructed  to return back to the  hospital for further evaluation and treatment.   PAST MEDICAL HISTORY:  As stated above.   PAST SURGICAL HISTORY:  As stated above.   FAMILY HISTORY:  Noncontributory.   SOCIAL HISTORY:  Negative for tobacco, alcohol or illicit drug use.   ALLERGIES:  1. CODEINE.  2. IODINE.  3. IRON.  4. LOTENSIN.  5. PENICILLIN.  6. SULFA.   MEDICATIONS:  1. Cymbalta 60 mg, one p.o. daily.  2. Wellbutrin XL 300 mg, one p.o. daily.  3. Synthroid 125 mcg, one p.o. daily.  4. Neurontin 300 mg, one p.o. t.i.d.  5. Metformin 1000 mg, one p.o. b.i.d.  6. Oxycodone 15 mg, one p.o. q.4 h.   PHYSICAL EXAMINATION:  VITAL SIGNS:  Stable.  GENERAL:  The patient appears to be fatigued but is alert and oriented.  HEENT: Normocephalic, atraumatic.  Extraocular muscles intact.  Pupils  equal, round, reactive to light.  NECK:  Supple.  No lymphadenopathy.  LUNGS:  Clear to auscultation bilaterally.  CARDIOVASCULAR:  Regular rate and rhythm without murmurs, gallops or  rubs.  ABDOMEN:  Obese, soft, nontender, nondistended.  EXTREMITIES:  No clubbing, cyanosis or edema.   LABORATORY VALUES:  CBC reveals that her hemoglobin is at 9.3 at this  time.   IMPRESSION:  1. Small bowel lesion with intermittent bleeding.  2. Presyncope and weakness secondary to etiology number one.  3. Morbid obesity.  4. Depression.  5. Diabetes.  6. Chronic back pain.   After evaluation of the patient, it is prudent to readmit the patient  for further workup of this bleeding site.  She does continue to bleed at  this time.  __________ positive for a Meckel's type of etiology then a  surgical consultation will be warranted.  If there is no evidence of  active bleeding on bleeding scan, then we will refer the patient over to  [facility] for a double balloon enteroscopy.  She may require  staying in the hospital for the duration in order to obtain the test or  to have close followup as an  outpatient with intermittent transfusions  as necessary      Jordan Hawks. Elnoria Howard, MD  Electronically Signed     PDH/MEDQ  D:  01/04/2006  T:  01/04/2006  Job:  [id number]   cc:   Stacie Acres. Cliffton Asters, M.D.

## 2010-05-28 NOTE — Discharge Summary (Signed)
Valerie Silva, Valerie Silva                ACCOUNT NO.:  [id number]   MEDICAL RECORD NO.:  [id number]          PATIENT TYPE:  INP   LOCATION:  4702                         FACILITY:  [facility]   PHYSICIAN:  Hollice Espy, M.D.DATE OF BIRTH:  12-Nov-1960   DATE OF ADMISSION:  12/22/2005  DATE OF DISCHARGE:  12/26/2005                               DISCHARGE SUMMARY   CONSULTANTS ON THIS CASE:  Dr. Elnoria Howard of [facility].   DISCHARGE DIAGNOSES:  1. Gastrointestinal bleed.  Suspected upper gastrointestinal bleed.  2. Obesity.  3. History of sleep apnea.   DISCHARGE MEDICATIONS:  The patient will be discharged solely on her  previous medications.  These are as follows:  1. Concerta 36 two tablets in the morning, one tablet in the evening.  2. Synthroid 250 mcg p.o. daily.  3. Cymbalta 60 p.o. daily.  4. Wellbutrin 300 p.o. daily.  5. Daypro p.o. b.i.d.  6. Metformin 1000 p.o. daily.  7. Multivitamin p.o. daily.  8. Calcium 500 p.o. t.i.d.  9. Glucosamine and chondroitin 2 p.o. daily.  10.Fish oil 1 tab p.o. daily.  11.Oxycodone IR 5-15 mg p.o. q.4-6 hours p.r.n.   HOSPITAL COURSE:  The patient is a 50 year old white female with past  medical history of sleep apnea and obesity, who presented to the  emergency room complaining of multiple episodes of black tarry stool.  She was evaluated by Dr. Elnoria Howard of [facility] and made n.p.o. and put on  IV Protonix.  She underwent an EGD on hospital day 2.  This was negative  for any signs of bleed.  She received 2 units of packed red blood cells  and her hemoglobin by December 23, 2005, had dropped down to 7.2.  By  December 24, 2005, she had a colonoscopy done, which was done all the  way to the cecum.  A few hemorrhoids were noted, but no active bleeding  site.  Stool remains residual brown.  Her hemoglobin remained relatively  stable.  She had no further drops in her hemoglobin where it had gone,  after a transfusion, from 9.1 up to 9.8, then 9.1, and  then 8.7.  This  was over a less than 24-hour period.  Discussion with Dr. Elnoria Howard since she  had no further melanotic episodes.  Her diet was advanced.  She was  tolerating this well and it was felt best that she go home with a  capsule endoscopy.  Capsule endoscopy could not be arranged as an  inpatient, but given the patient's stability, she was discharged on  December 26, 2005, with plans to follow up with  Dr. Elnoria Howard as an outpatient for capsule endoscopy.  Her disposition is  improved.  Her activity will be slow increase.  Her diet will be a low  fat diet and she will see her PCP, Dr. Laurann Montana, in the next 1-2  weeks, as well as Dr. Elnoria Howard as above.      Hollice Espy, M.D.  Electronically Signed     SKK/MEDQ  D:  03/01/2006  T:  03/02/2006  Job:  [id number]  cc:   Stacie Acres. Cliffton Asters, M.D.  Jordan Hawks Elnoria Howard, MD

## 2010-05-28 NOTE — Op Note (Signed)
Valerie Silva, Valerie Silva NO.:  [id number]   MEDICAL RECORD NO.:  [id number]          PATIENT TYPE:  INP   LOCATION:  4702                         FACILITY:  [facility]   PHYSICIAN:  Georgiana Spinner, M.D.    DATE OF BIRTH:  Jul 07, 1960   DATE OF PROCEDURE:  12/24/2005  DATE OF DISCHARGE:                               OPERATIVE REPORT   PROCEDURE:  Colonoscopy.   INDICATIONS:  GI bleeding.   ANESTHESIA:  Fentanyl 100 mcg, Versed 10 mg.   PROCEDURE:  With the patient mildly sedated in the left lateral  decubitus position, the Pentax videoscopic colonoscope was inserted into  the rectum and passed under direct vision with pressure applied.  The  patient was rolled to her back and subsequently to a right lateral  decubitus position, we were able then to reach the cecum identified by  the ileocecal valve and base of cecum.  Photographs taken.  From this  point the colonoscope was slowly withdrawn, taking circumferential views  of the colonic mucosa; after attempts at intubating the ileocecal valve  were unsuccessful. The colonoscope was withdrawn all the way to the  rectum, which appeared normal on direct and showed hemorrhoids on  retroflexed view.  The endoscope was straightened and withdrawn.  The  patient's vital signs, pulse oximetry remained stable.  The patient  tolerated the procedure well and without apparent complication.   FINDINGS:  Brown liquid material, light in color, was found throughout  the colon -- without any evidence of active bleeding or even any signs  now of blood at all, other than some minor trauma to the internal  hemorrhoids.   IMPRESSION:  Active bleeding may have ceased for the time being.   PLAN:  If the patient remains stable, would proceed with capsule  endoscopy; however, if re-bleeding occurs and is acute, then bleeding  scan would be the next step.  Will follow H&H and allow clear liquid  diet at this time .     ______________________________  Georgiana Spinner, M.D.     GMO/MEDQ  D:  12/24/2005  T:  12/24/2005  Job:  [id number]   cc:   Jordan Hawks. Elnoria Howard, MD  Stacie Acres. Cliffton Asters, M.D.

## 2010-06-21 ENCOUNTER — Other Ambulatory Visit: Payer: Self-pay | Admitting: Orthopedic Surgery

## 2010-06-21 DIAGNOSIS — IMO0002 Reserved for concepts with insufficient information to code with codable children: Secondary | ICD-10-CM

## 2010-06-22 ENCOUNTER — Other Ambulatory Visit: Payer: Self-pay | Admitting: Orthopedic Surgery

## 2010-06-22 DIAGNOSIS — IMO0002 Reserved for concepts with insufficient information to code with codable children: Secondary | ICD-10-CM

## 2010-06-23 ENCOUNTER — Ambulatory Visit
Admission: RE | Admit: 2010-06-23 | Discharge: 2010-06-23 | Disposition: A | Discharge status: Home / Self Care | Payer: Federal, State, Local not specified - PPO | Source: Ambulatory Visit | Attending: Orthopedic Surgery | Admitting: Orthopedic Surgery

## 2010-06-23 ENCOUNTER — Other Ambulatory Visit: Payer: Self-pay | Admitting: Orthopedic Surgery

## 2010-06-23 DIAGNOSIS — IMO0002 Reserved for concepts with insufficient information to code with codable children: Secondary | ICD-10-CM

## 2010-06-24 ENCOUNTER — Other Ambulatory Visit: Payer: Self-pay | Admitting: Orthopedic Surgery

## 2010-06-25 ENCOUNTER — Other Ambulatory Visit: Payer: Self-pay | Admitting: Orthopedic Surgery

## 2010-06-25 ENCOUNTER — Ambulatory Visit
Admission: RE | Admit: 2010-06-25 | Discharge: 2010-06-25 | Disposition: A | Discharge status: Home / Self Care | Payer: Federal, State, Local not specified - PPO | Source: Ambulatory Visit | Attending: Orthopedic Surgery | Admitting: Orthopedic Surgery

## 2010-06-25 DIAGNOSIS — IMO0002 Reserved for concepts with insufficient information to code with codable children: Secondary | ICD-10-CM

## 2010-06-25 DIAGNOSIS — S32000A Wedge compression fracture of unspecified lumbar vertebra, initial encounter for closed fracture: Secondary | ICD-10-CM

## 2010-07-06 ENCOUNTER — Other Ambulatory Visit: Payer: Self-pay | Admitting: Orthopedic Surgery

## 2010-07-06 DIAGNOSIS — M4850XA Collapsed vertebra, not elsewhere classified, site unspecified, initial encounter for fracture: Secondary | ICD-10-CM

## 2010-07-15 ENCOUNTER — Other Ambulatory Visit: Payer: Federal, State, Local not specified - PPO

## 2010-07-22 ENCOUNTER — Inpatient Hospital Stay: Admission: RE | Admit: 2010-07-22 | Payer: Federal, State, Local not specified - PPO | Source: Ambulatory Visit

## 2010-07-22 ENCOUNTER — Other Ambulatory Visit: Payer: Self-pay | Admitting: Radiology

## 2010-08-05 ENCOUNTER — Ambulatory Visit
Admission: RE | Admit: 2010-08-05 | Discharge: 2010-08-05 | Disposition: A | Discharge status: Home / Self Care | Payer: Federal, State, Local not specified - PPO | Source: Ambulatory Visit | Attending: Orthopedic Surgery | Admitting: Orthopedic Surgery

## 2010-08-05 DIAGNOSIS — M4850XA Collapsed vertebra, not elsewhere classified, site unspecified, initial encounter for fracture: Secondary | ICD-10-CM

## 2010-08-20 ENCOUNTER — Encounter (HOSPITAL_BASED_OUTPATIENT_CLINIC_OR_DEPARTMENT_OTHER): Payer: Federal, State, Local not specified - PPO | Admitting: Hematology & Oncology

## 2010-08-20 ENCOUNTER — Other Ambulatory Visit: Payer: Self-pay | Admitting: Hematology & Oncology

## 2010-08-20 DIAGNOSIS — D696 Thrombocytopenia, unspecified: Secondary | ICD-10-CM

## 2010-08-20 DIAGNOSIS — D509 Iron deficiency anemia, unspecified: Secondary | ICD-10-CM

## 2010-08-20 DIAGNOSIS — Z9884 Bariatric surgery status: Secondary | ICD-10-CM

## 2010-08-20 LAB — RETICULOCYTES (CHCC)
ABS Retic: 46.7 10*3/uL (ref 19.0–186.0)
Retic Ct Pct: 1.2 % (ref 0.4–2.3)

## 2010-08-20 LAB — CBC WITH DIFFERENTIAL (CANCER CENTER ONLY)
BASO%: 0.3 % (ref 0.0–2.0)
EOS%: 2.2 % (ref 0.0–7.0)
HCT: 34.8 % (ref 34.8–46.6)
LYMPH#: 1.4 10*3/uL (ref 0.9–3.3)
MCHC: 33.6 g/dL (ref 32.0–36.0)
NEUT%: 52.9 % (ref 39.6–80.0)
Platelets: 134 10*3/uL — ABNORMAL LOW (ref 145–400)
RDW: 13.5 % (ref 11.1–15.7)

## 2010-08-20 LAB — IRON AND TIBC
%SAT: 29 % (ref 20–55)
TIBC: 295 ug/dL (ref 250–470)

## 2010-08-20 LAB — CHCC SATELLITE - SMEAR

## 2010-10-19 ENCOUNTER — Encounter (HOSPITAL_COMMUNITY): Payer: Federal, State, Local not specified - PPO

## 2010-10-27 LAB — CBC
HCT: 40.2
Hemoglobin: 13
MCHC: 32.2
MCV: 87.3
Platelets: 232
RBC: 4.61
RDW: 20.1 — ABNORMAL HIGH
WBC: 9.9

## 2010-10-27 LAB — DIFFERENTIAL
Basophils Absolute: 0.1
Basophils Relative: 1
Eosinophils Absolute: 0.1
Eosinophils Relative: 1
Lymphocytes Relative: 24
Lymphs Abs: 2.4
Monocytes Absolute: 0.5
Monocytes Relative: 6
Neutro Abs: 6.8
Neutrophils Relative %: 69

## 2010-10-27 LAB — COMPREHENSIVE METABOLIC PANEL WITH GFR
ALT: 58 — ABNORMAL HIGH
AST: 32
Albumin: 3.6
Alkaline Phosphatase: 52
BUN: 12
CO2: 26
Calcium: 8.8
Chloride: 105
Creatinine, Ser: 0.8
GFR calc non Af Amer: >60
Glucose, Bld: 87
Potassium: 3.6
Sodium: 139
Total Bilirubin: 0.4
Total Protein: 6.7

## 2010-10-27 LAB — POCT CARDIAC MARKERS
CKMB, poc: <1 — ABNORMAL LOW
Myoglobin, poc: 40.5
Operator id: 1192
Troponin i, poc: <0.05

## 2010-10-27 LAB — LIPASE, BLOOD: Lipase: 34

## 2010-11-12 ENCOUNTER — Other Ambulatory Visit: Payer: Self-pay | Admitting: Family Medicine

## 2010-11-15 ENCOUNTER — Other Ambulatory Visit (HOSPITAL_COMMUNITY): Payer: Self-pay | Admitting: *Deleted

## 2010-11-16 ENCOUNTER — Other Ambulatory Visit (HOSPITAL_COMMUNITY): Payer: Self-pay | Admitting: *Deleted

## 2010-11-29 ENCOUNTER — Encounter (HOSPITAL_COMMUNITY): Payer: Self-pay | Admitting: *Deleted

## 2010-11-30 ENCOUNTER — Other Ambulatory Visit (HOSPITAL_COMMUNITY): Payer: Self-pay | Admitting: *Deleted

## 2010-11-30 ENCOUNTER — Encounter (HOSPITAL_COMMUNITY)
Admission: RE | Admit: 2010-11-30 | Discharge: 2010-11-30 | Disposition: A | Discharge status: Home / Self Care | Payer: Federal, State, Local not specified - PPO | Source: Ambulatory Visit | Attending: Family Medicine | Admitting: Family Medicine

## 2010-11-30 DIAGNOSIS — M810 Age-related osteoporosis without current pathological fracture: Secondary | ICD-10-CM | POA: Insufficient documentation

## 2010-11-30 MED ORDER — SODIUM CHLORIDE 0.9 % IV SOLN
INTRAVENOUS | Status: AC
  Administered 2010-11-30: 15:00:00 via INTRAVENOUS

## 2010-11-30 MED ORDER — ZOLEDRONIC ACID 5 MG/100ML IV SOLN
5.0000 mg | Freq: Once | INTRAVENOUS | Status: AC
  Administered 2010-11-30: 5 mg via INTRAVENOUS
  Filled 2010-11-30: qty 100

## 2010-12-10 ENCOUNTER — Other Ambulatory Visit: Payer: Self-pay | Admitting: *Deleted

## 2010-12-10 ENCOUNTER — Telehealth: Payer: Self-pay | Admitting: *Deleted

## 2010-12-10 DIAGNOSIS — F419 Anxiety disorder, unspecified: Secondary | ICD-10-CM

## 2010-12-10 MED ORDER — ALPRAZOLAM 0.5 MG PO TABS: 0.5000 mg | ORAL_TABLET | Freq: Three times a day (TID) | ORAL | Status: DC | PRN

## 2011-02-04 NOTE — Telephone Encounter (Signed)
error 

## 2011-02-10 ENCOUNTER — Other Ambulatory Visit: Payer: Self-pay | Admitting: *Deleted

## 2011-02-10 DIAGNOSIS — F419 Anxiety disorder, unspecified: Secondary | ICD-10-CM

## 2011-02-10 NOTE — Telephone Encounter (Signed)
Received refill request for Xanax from [facility]. Refill request routed to Dr [name] for approval.

## 2011-02-11 MED ORDER — ALPRAZOLAM 0.5 MG PO TABS: 0.5000 mg | ORAL_TABLET | Freq: Three times a day (TID) | ORAL | Status: DC | PRN

## 2011-03-02 ENCOUNTER — Ambulatory Visit: Payer: Federal, State, Local not specified - PPO | Admitting: Hematology & Oncology

## 2011-03-31 ENCOUNTER — Other Ambulatory Visit: Payer: Self-pay | Admitting: *Deleted

## 2011-03-31 DIAGNOSIS — F419 Anxiety disorder, unspecified: Secondary | ICD-10-CM

## 2011-03-31 MED ORDER — ALPRAZOLAM 0.5 MG PO TABS: 0.5000 mg | ORAL_TABLET | Freq: Three times a day (TID) | ORAL | Status: DC | PRN

## 2011-04-06 ENCOUNTER — Ambulatory Visit (HOSPITAL_BASED_OUTPATIENT_CLINIC_OR_DEPARTMENT_OTHER): Payer: Federal, State, Local not specified - PPO

## 2011-04-06 ENCOUNTER — Encounter: Payer: Self-pay | Admitting: Hematology & Oncology

## 2011-04-06 ENCOUNTER — Other Ambulatory Visit: Payer: Federal, State, Local not specified - PPO | Admitting: Lab

## 2011-04-06 ENCOUNTER — Ambulatory Visit (HOSPITAL_BASED_OUTPATIENT_CLINIC_OR_DEPARTMENT_OTHER): Payer: Federal, State, Local not specified - PPO | Admitting: Hematology & Oncology

## 2011-04-06 ENCOUNTER — Telehealth: Payer: Self-pay | Admitting: Hematology & Oncology

## 2011-04-06 DIAGNOSIS — Z9884 Bariatric surgery status: Secondary | ICD-10-CM

## 2011-04-06 DIAGNOSIS — D510 Vitamin B12 deficiency anemia due to intrinsic factor deficiency: Secondary | ICD-10-CM

## 2011-04-06 DIAGNOSIS — D509 Iron deficiency anemia, unspecified: Secondary | ICD-10-CM

## 2011-04-06 DIAGNOSIS — F329 Major depressive disorder, single episode, unspecified: Secondary | ICD-10-CM

## 2011-04-06 DIAGNOSIS — F419 Anxiety disorder, unspecified: Secondary | ICD-10-CM

## 2011-04-06 HISTORY — DX: Iron deficiency anemia, unspecified: D50.9

## 2011-04-06 HISTORY — DX: Vitamin B12 deficiency anemia due to intrinsic factor deficiency: D51.0

## 2011-04-06 LAB — CBC WITH DIFFERENTIAL (CANCER CENTER ONLY)
BASO%: 0.4 % (ref 0.0–2.0)
Eosinophils Absolute: 0.1 10*3/uL (ref 0.0–0.5)
LYMPH#: 1.2 10*3/uL (ref 0.9–3.3)
MCV: 93 fL (ref 81–101)
MONO#: 0.3 10*3/uL (ref 0.1–0.9)
Platelets: 128 10*3/uL — ABNORMAL LOW (ref 145–400)
RBC: 3.63 10*6/uL — ABNORMAL LOW (ref 3.70–5.32)
RDW: 12.8 % (ref 11.1–15.7)
WBC: 2.9 10*3/uL — ABNORMAL LOW (ref 3.9–10.0)

## 2011-04-06 LAB — FERRITIN: Ferritin: 28 ng/mL (ref 10–291)

## 2011-04-06 LAB — IRON AND TIBC
%SAT: 23 % (ref 20–55)
Iron: 70 ug/dL (ref 42–145)
TIBC: 299 ug/dL (ref 250–470)
UIBC: 229 ug/dL (ref 125–400)

## 2011-04-06 MED ORDER — ALPRAZOLAM 1 MG PO TABS: 1.0000 mg | ORAL_TABLET | Freq: Every evening | ORAL | Status: DC | PRN

## 2011-04-06 MED ORDER — CYANOCOBALAMIN 1000 MCG/ML IJ SOLN
1000.0000 ug | Freq: Once | INTRAMUSCULAR | Status: AC
  Administered 2011-04-06: 1000 ug via INTRAMUSCULAR

## 2011-04-06 NOTE — Progress Notes (Signed)
This office note has been dictated.

## 2011-04-06 NOTE — Telephone Encounter (Signed)
Per order MD wanted pt back in 4 weeks 4-24 pt is going out of town and scheduled 5-8

## 2011-04-06 NOTE — Progress Notes (Signed)
Addended by: Arlan Organ R on: 04/06/2011 04:39 PM   Modules accepted: Orders, Medications
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
## 2011-04-11 ENCOUNTER — Telehealth: Payer: Self-pay | Admitting: *Deleted

## 2011-04-11 NOTE — Telephone Encounter (Addendum)
Message copied by Mirian Capuchin on Mon Apr 11, 2011  4:05 PM ------      Message from: Arlan Organ R      Created: Wed Apr 06, 2011  7:08 PM       Call her and tell her that her iron is low. She will need IV iron. He is set up Feraheme at 1020 mg for the next week or so.Ask her when she would like to come in thanks  Pt given this message and connected to scheduler.

## 2011-04-12 ENCOUNTER — Telehealth: Payer: Self-pay | Admitting: *Deleted

## 2011-04-12 NOTE — Telephone Encounter (Signed)
Left message on pt's generic home voicemail stating her call was being returned from yesterday. Asked her to call the office back.  She called because "when Dr. [name] gave me a new Xanax prescription it was written to be only taken at night and I sometimes have to take it 2 or even 3 times per day. I have enough to get through until my next appointment but want to make sure Dr [name] is ok with me doing this". Reviewed with Dr. [name]. He is ok with this. Will let her know when she calls back.

## 2011-04-15 ENCOUNTER — Ambulatory Visit (HOSPITAL_BASED_OUTPATIENT_CLINIC_OR_DEPARTMENT_OTHER): Payer: Medicare Other

## 2011-04-15 VITALS — BP 138/70 | HR 87 | Temp 97.8°F

## 2011-04-15 DIAGNOSIS — D510 Vitamin B12 deficiency anemia due to intrinsic factor deficiency: Secondary | ICD-10-CM

## 2011-04-15 DIAGNOSIS — Z9884 Bariatric surgery status: Secondary | ICD-10-CM

## 2011-04-15 DIAGNOSIS — D509 Iron deficiency anemia, unspecified: Secondary | ICD-10-CM

## 2011-04-15 MED ORDER — CYANOCOBALAMIN 1000 MCG/ML IJ SOLN: 1000.0000 ug | Freq: Once | INTRAMUSCULAR | Status: DC

## 2011-04-15 MED ORDER — SODIUM CHLORIDE 0.9 % IV SOLN
Freq: Once | INTRAVENOUS | Status: AC
  Administered 2011-04-15: 16:00:00 via INTRAVENOUS

## 2011-04-15 MED ORDER — DIPHENHYDRAMINE HCL 50 MG/ML IJ SOLN
25.0000 mg | Freq: Once | INTRAMUSCULAR | Status: AC
  Administered 2011-04-15: 25 mg via INTRAVENOUS

## 2011-04-15 MED ORDER — METHYLPREDNISOLONE SODIUM SUCC 125 MG IJ SOLR
60.0000 mg | Freq: Once | INTRAMUSCULAR | Status: AC
  Administered 2011-04-15: 60 mg via INTRAVENOUS

## 2011-04-15 MED ORDER — SODIUM CHLORIDE 0.9 % IV SOLN
1020.0000 mg | Freq: Once | INTRAVENOUS | Status: AC
  Administered 2011-04-15: 1020 mg via INTRAVENOUS
  Filled 2011-04-15: qty 34

## 2011-05-18 ENCOUNTER — Ambulatory Visit (HOSPITAL_BASED_OUTPATIENT_CLINIC_OR_DEPARTMENT_OTHER): Payer: Medicare Other | Admitting: Hematology & Oncology

## 2011-05-18 ENCOUNTER — Ambulatory Visit (HOSPITAL_BASED_OUTPATIENT_CLINIC_OR_DEPARTMENT_OTHER): Payer: Medicare Other

## 2011-05-18 ENCOUNTER — Other Ambulatory Visit (HOSPITAL_BASED_OUTPATIENT_CLINIC_OR_DEPARTMENT_OTHER): Payer: Medicare Other | Admitting: Lab

## 2011-05-18 VITALS — BP 107/69 | HR 91 | Temp 98.3°F | Ht 69.0 in | Wt 172.0 lb

## 2011-05-18 DIAGNOSIS — Z9884 Bariatric surgery status: Secondary | ICD-10-CM

## 2011-05-18 DIAGNOSIS — D510 Vitamin B12 deficiency anemia due to intrinsic factor deficiency: Secondary | ICD-10-CM

## 2011-05-18 DIAGNOSIS — D509 Iron deficiency anemia, unspecified: Secondary | ICD-10-CM

## 2011-05-18 DIAGNOSIS — D631 Anemia in chronic kidney disease: Secondary | ICD-10-CM

## 2011-05-18 DIAGNOSIS — F419 Anxiety disorder, unspecified: Secondary | ICD-10-CM

## 2011-05-18 DIAGNOSIS — F411 Generalized anxiety disorder: Secondary | ICD-10-CM

## 2011-05-18 DIAGNOSIS — E538 Deficiency of other specified B group vitamins: Secondary | ICD-10-CM

## 2011-05-18 LAB — CBC WITH DIFFERENTIAL (CANCER CENTER ONLY)
BASO#: 0 10*3/uL (ref 0.0–0.2)
EOS%: 3.7 % (ref 0.0–7.0)
HCT: 33.1 % — ABNORMAL LOW (ref 34.8–46.6)
HGB: 10.8 g/dL — ABNORMAL LOW (ref 11.6–15.9)
LYMPH#: 1.3 10*3/uL (ref 0.9–3.3)
LYMPH%: 34.9 % (ref 14.0–48.0)
MCH: 30.9 pg (ref 26.0–34.0)
MCHC: 32.6 g/dL (ref 32.0–36.0)
MCV: 95 fL (ref 81–101)
NEUT%: 51 % (ref 39.6–80.0)
RDW: 13.8 % (ref 11.1–15.7)

## 2011-05-18 LAB — VITAMIN B12: Vitamin B-12: 341 pg/mL (ref 211–911)

## 2011-05-18 MED ORDER — CYANOCOBALAMIN 1000 MCG/ML IJ SOLN
1000.0000 ug | Freq: Once | INTRAMUSCULAR | Status: AC
  Administered 2011-05-18: 1000 ug via INTRAMUSCULAR

## 2011-05-18 MED ORDER — ALPRAZOLAM 1 MG PO TABS: ORAL_TABLET | ORAL | Status: DC

## 2011-05-18 NOTE — Progress Notes (Signed)
This office note has been dictated.

## 2011-05-19 ENCOUNTER — Telehealth: Payer: Self-pay | Admitting: Hematology & Oncology

## 2011-05-19 NOTE — Telephone Encounter (Signed)
Mailed July schedule °
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
## 2011-06-16 ENCOUNTER — Telehealth: Payer: Self-pay | Admitting: Hematology & Oncology

## 2011-06-16 ENCOUNTER — Other Ambulatory Visit: Payer: Self-pay | Admitting: *Deleted

## 2011-06-16 DIAGNOSIS — F419 Anxiety disorder, unspecified: Secondary | ICD-10-CM

## 2011-06-16 MED ORDER — ALPRAZOLAM 1 MG PO TABS: ORAL_TABLET | ORAL | Status: DC

## 2011-06-16 NOTE — Telephone Encounter (Signed)
Pt called said needed to reschedule inj appointment again. She also wanted xanex refill. I transferred to RN for refill and make sure it is ok to cancel inj appointment and come 07-18-11

## 2011-06-17 ENCOUNTER — Ambulatory Visit: Payer: Medicare Other

## 2011-07-18 ENCOUNTER — Ambulatory Visit (HOSPITAL_BASED_OUTPATIENT_CLINIC_OR_DEPARTMENT_OTHER): Payer: Medicare Other

## 2011-07-18 ENCOUNTER — Ambulatory Visit: Payer: Medicare Other | Admitting: Hematology & Oncology

## 2011-07-18 ENCOUNTER — Other Ambulatory Visit (HOSPITAL_BASED_OUTPATIENT_CLINIC_OR_DEPARTMENT_OTHER): Payer: Medicare Other | Admitting: Lab

## 2011-07-18 ENCOUNTER — Other Ambulatory Visit: Payer: Medicare Other | Admitting: Lab

## 2011-07-18 ENCOUNTER — Ambulatory Visit (HOSPITAL_BASED_OUTPATIENT_CLINIC_OR_DEPARTMENT_OTHER): Payer: Medicare Other | Admitting: Medical

## 2011-07-18 VITALS — BP 104/66 | HR 92 | Temp 98.2°F | Ht 69.0 in | Wt 181.0 lb

## 2011-07-18 DIAGNOSIS — D509 Iron deficiency anemia, unspecified: Secondary | ICD-10-CM

## 2011-07-18 DIAGNOSIS — D631 Anemia in chronic kidney disease: Secondary | ICD-10-CM

## 2011-07-18 DIAGNOSIS — N189 Chronic kidney disease, unspecified: Secondary | ICD-10-CM

## 2011-07-18 DIAGNOSIS — F419 Anxiety disorder, unspecified: Secondary | ICD-10-CM

## 2011-07-18 DIAGNOSIS — D510 Vitamin B12 deficiency anemia due to intrinsic factor deficiency: Secondary | ICD-10-CM

## 2011-07-18 DIAGNOSIS — E538 Deficiency of other specified B group vitamins: Secondary | ICD-10-CM

## 2011-07-18 DIAGNOSIS — Z9884 Bariatric surgery status: Secondary | ICD-10-CM

## 2011-07-18 DIAGNOSIS — F411 Generalized anxiety disorder: Secondary | ICD-10-CM

## 2011-07-18 DIAGNOSIS — G8929 Other chronic pain: Secondary | ICD-10-CM

## 2011-07-18 LAB — CBC WITH DIFFERENTIAL (CANCER CENTER ONLY)
BASO%: 0.2 % (ref 0.0–2.0)
Eosinophils Absolute: 0.1 10*3/uL (ref 0.0–0.5)
MONO#: 0.3 10*3/uL (ref 0.1–0.9)
NEUT#: 4.6 10*3/uL (ref 1.5–6.5)
Platelets: 154 10*3/uL (ref 145–400)
RBC: 3.73 10*6/uL (ref 3.70–5.32)
WBC: 6.2 10*3/uL (ref 3.9–10.0)

## 2011-07-18 LAB — CHCC SATELLITE - SMEAR

## 2011-07-18 MED ORDER — CYANOCOBALAMIN 1000 MCG/ML IJ SOLN
1000.0000 ug | Freq: Once | INTRAMUSCULAR | Status: AC
  Administered 2011-07-18: 1000 ug via INTRAMUSCULAR

## 2011-07-18 MED ORDER — CYANOCOBALAMIN 1000 MCG/ML IJ SOLN: 1000.0000 ug | Freq: Once | INTRAMUSCULAR | Status: DC

## 2011-07-18 MED ORDER — ALPRAZOLAM 1 MG PO TABS: ORAL_TABLET | ORAL | Status: DC

## 2011-07-19 ENCOUNTER — Other Ambulatory Visit: Payer: Medicare Other | Admitting: Lab

## 2011-07-19 ENCOUNTER — Other Ambulatory Visit: Payer: Self-pay | Admitting: *Deleted

## 2011-07-19 ENCOUNTER — Ambulatory Visit: Payer: Medicare Other | Admitting: Hematology & Oncology

## 2011-07-19 DIAGNOSIS — D631 Anemia in chronic kidney disease: Secondary | ICD-10-CM

## 2011-07-19 DIAGNOSIS — D510 Vitamin B12 deficiency anemia due to intrinsic factor deficiency: Secondary | ICD-10-CM

## 2011-07-19 DIAGNOSIS — E538 Deficiency of other specified B group vitamins: Secondary | ICD-10-CM | POA: Insufficient documentation

## 2011-07-19 DIAGNOSIS — D509 Iron deficiency anemia, unspecified: Secondary | ICD-10-CM

## 2011-07-19 DIAGNOSIS — F419 Anxiety disorder, unspecified: Secondary | ICD-10-CM

## 2011-07-19 LAB — FERRITIN: Ferritin: 63 ng/mL (ref 10–291)

## 2011-07-19 LAB — IRON AND TIBC
TIBC: 346 ug/dL (ref 250–470)
UIBC: 301 ug/dL (ref 125–400)

## 2011-07-19 LAB — RETICULOCYTES (CHCC): RBC.: 3.83 MIL/uL — ABNORMAL LOW (ref 3.87–5.11)

## 2011-07-19 MED ORDER — ALPRAZOLAM 1 MG PO TABS: ORAL_TABLET | ORAL | Status: DC

## 2011-07-19 NOTE — Progress Notes (Signed)
Patient Name : Valerie Silva, Crafton MR # [id number] DOB:1960/03/01 Encounter Date: 07/2011 Dictated by Eunice Blase, PA-C  Diagnoses #1.  Iron deficiency anemia, secondary to gastric bypass. #2 pernicious anemia, secondary to gastric bypass. #3 severe anxiety.  Current therapy: #1 vitamin, B12 1 mg IM monthly. #2 IV iron as indicated. #3 Xanax 1 mg by mouth Q8 hours when necessary.  Interim history: [name] presents today for an office followup visit.  Overall, she reports, that she's been doing relatively well.  She did receive IV iron in the form of Feraheme back in April.  She was also recently started on vitamin B12 injections back in May.  She did have to skip her June B12 injection due to transportation issues.  Overall, her blood, looks good today.  Her hemoglobin has come up from 10.8.  Back in May to 11.7, as of today.  She denies any type of bleeding, any gum bleeding nosebleeds, any rectal bleeding.  She denies any type of nausea, vomiting, diarrhea, or constipation.  She denies any chest pain, or shortness of breath.  She still continues to have chronic pain issues, as well as chronic anxiety.  Her ferritin level is 63, her iron is 45, TIBC is 346 with 13% saturation.  She is able to perform her activities of daily living.  She reports that her eating is minimal do to poor dentition, as she is losing some of her teeth.  She mainly eats soft foods.  Review of Systems: Pt. Denies any changes in their vision, hearing, adenopathy, fevers, chills, nausea, vomiting, diarrhea, constipation, chest pain, shortness of breath, passing blood, passing out, blacking out,  any changes in skin, joints, neurologic or psychiatric except as noted.  Physical Exam: This is a pleasant, 51 year old female, who presents in no obvious distress Vitals:  Temperature 98.5 degrees.  Pulse 92, respirations 20, blood pressure is 104/66.  Weight is 181 pounds HEENT reveals a normocephalic, atraumatic skull, no scleral  icterus, no oral lesions  Neck is supple without any cervical or supraclavicular adenopathy.  Lungs are clear to auscultation bilaterally.  Cardiac is regular rate and rhythm with a normal S1 and S2. There are no murmurs, rubs, or bruits.  Abdomen is soft with good bowel sounds there's a palpable mass. There is no palpable hepatosplenomegaly. There is no palpable fluid wave.  Musculoskeletal no tenderness of the spine, ribs, or hips.  Extremities there are no clubbing, cyanosis, or edema.  Skin no petechia, purpura or ecchymosis Neurologic is nonfocal.  Laboratory Data:  White count 6.2, hemoglobin 1.7, hematocrit 36.3.  Platelets are 154,000, ferritin 63, iron 45, TIBC 346, with 13% saturation  Current Outpatient Prescriptions on File Prior to Visit  Medication Sig Dispense Refill  . Calcium Carbonate-Vitamin D (CALCIUM + D PO) Take by mouth every morning. Has magnesium and zinc in it also.      . cyclobenzaprine (FLEXERIL) 10 MG tablet Take 10 mg by mouth 4 (four) times daily.       . diclofenac (VOLTAREN) 75 MG EC tablet Take 75 mg by mouth 3 (three) times daily.       Marland Kitchen escitalopram (LEXAPRO) 20 MG tablet Take 20 mg by mouth daily.       Marland Kitchen levothyroxine (SYNTHROID, LEVOTHROID) 300 MCG tablet Take 137 mcg by mouth daily. Takes 2 pills every AM 137 mcg each.to equal      . methadone (DOLOPHINE) 10 MG tablet Take 10 mg by mouth every 8 (eight) hours.        Marland Kitchen  Multiple Vitamins-Minerals (MULTIVITAL PO) Take by mouth every morning.      Marland Kitchen oxyCODONE-acetaminophen (PERCOCET) 10-325 MG per tablet Take 1 tablet by mouth every 6 (six) hours as needed.        . promethazine (PHENERGAN) 25 MG tablet Take 25 mg by mouth at bedtime as needed.        . rizatriptan (MAXALT) 10 MG tablet Take 10 mg by mouth as needed. May repeat in 2 hours if needed       . topiramate (TOPAMAX) 200 MG tablet Take 200 mg by mouth at bedtime.        . triamterene-hydrochlorothiazide (MAXZIDE) 75-50 MG per tablet Take 1  tablet by mouth daily.        . Vitamin D, Ergocalciferol, (DRISDOL) 50000 UNITS CAPS Take 50,000 Units by mouth 2 (two) times a week.         Impression/Plan:  This is a 51 year old female with the following issues. #1 vitamin B12 deficiency-this is secondary to history of gastric bypass.  We will continue with monthly vitamin B12 injections at 1 mg IM.  #2, pernicious anemia, secondary to gastric bypass.  We will continue to monitor her iron panel, and provide IV iron as needed.  #3 anxiety disorder-I refilled her Xanax today at 1 mg by mouth Q8 hours when necessary  #4.  Followup-Ms. Heffler will followup in one month for vitamin, B12 injection and with Dr.Ennever  in 2 months

## 2011-07-19 NOTE — Telephone Encounter (Signed)
Pt called stating that she was told her Xanax would be sent to [facility] yesterday. When she went to pick it up it wasn't there and they stated that it hadn't been called in and they can't accept a fax without confirming it and it could take 24 hours to process that. She said she is completely out of pills. Called rx to [facility] at [phone]. This is a chronic medication for the pt.

## 2011-07-22 ENCOUNTER — Telehealth: Payer: Self-pay | Admitting: *Deleted

## 2011-07-22 ENCOUNTER — Other Ambulatory Visit: Payer: Self-pay | Admitting: Oncology

## 2011-07-22 ENCOUNTER — Telehealth: Payer: Self-pay | Admitting: Oncology

## 2011-07-22 DIAGNOSIS — D510 Vitamin B12 deficiency anemia due to intrinsic factor deficiency: Secondary | ICD-10-CM

## 2011-07-22 NOTE — Telephone Encounter (Signed)
Message copied by Lacie Draft on Fri Jul 22, 2011  5:04 PM ------      Message from: Arlan Organ R      Created: Fri Jul 22, 2011 11:10 AM       Please call and let her now that her iron is actually dropping again. He did I do need to give her a dose of iron. Please set her up with 1020 mg of Fera heme in 1-2 weeks. Thank you. Cindee Lame

## 2011-07-22 NOTE — Telephone Encounter (Signed)
Called patient and left message to call us to schedule iron appt as below in MD note

## 2011-07-22 NOTE — Telephone Encounter (Signed)
Message copied by Anselm Jungling on Fri Jul 22, 2011  3:00 PM ------      Message from: Arlan Organ R      Created: Fri Jul 22, 2011 11:10 AM       Please call and let her now that her iron is actually dropping again. He did I do need to give her a dose of iron. Please set her up with 1020 mg of Fera heme in 1-2 weeks. Thank you. Cindee Lame

## 2011-07-22 NOTE — Telephone Encounter (Addendum)
Message copied by Lacie Draft on Fri Jul 22, 2011  5:10 PM ------      Message from: Arlan Organ R      Created: Fri Jul 22, 2011 11:10 AM       Please call and let her now that her iron is actually dropping again. He did I do need to give her a dose of iron. Please set her up with 1020 mg of Fera heme in 1-2 weeks. Thank you. Cindee Lame  Orders placed in signed and held. Teola Bradley, Kelvyn Schunk [employer]

## 2011-07-27 ENCOUNTER — Encounter: Payer: Self-pay | Admitting: *Deleted
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
## 2011-07-30 ENCOUNTER — Encounter (HOSPITAL_COMMUNITY): Payer: Self-pay | Admitting: Emergency Medicine

## 2011-07-30 ENCOUNTER — Observation Stay (HOSPITAL_COMMUNITY)
Admission: EM | Admit: 2011-07-30 | Discharge: 2011-08-02 | Disposition: A | Discharge status: Home / Self Care | Payer: Medicare Other | Attending: Internal Medicine | Admitting: Internal Medicine

## 2011-07-30 ENCOUNTER — Emergency Department (HOSPITAL_COMMUNITY): Payer: Medicare Other

## 2011-07-30 DIAGNOSIS — E119 Type 2 diabetes mellitus without complications: Secondary | ICD-10-CM | POA: Insufficient documentation

## 2011-07-30 DIAGNOSIS — D649 Anemia, unspecified: Secondary | ICD-10-CM

## 2011-07-30 DIAGNOSIS — IMO0001 Reserved for inherently not codable concepts without codable children: Secondary | ICD-10-CM | POA: Insufficient documentation

## 2011-07-30 DIAGNOSIS — D631 Anemia in chronic kidney disease: Secondary | ICD-10-CM

## 2011-07-30 DIAGNOSIS — F418 Other specified anxiety disorders: Secondary | ICD-10-CM | POA: Diagnosis present

## 2011-07-30 DIAGNOSIS — M129 Arthropathy, unspecified: Secondary | ICD-10-CM | POA: Diagnosis present

## 2011-07-30 DIAGNOSIS — F419 Anxiety disorder, unspecified: Secondary | ICD-10-CM

## 2011-07-30 DIAGNOSIS — I519 Heart disease, unspecified: Secondary | ICD-10-CM | POA: Insufficient documentation

## 2011-07-30 DIAGNOSIS — R079 Chest pain, unspecified: Secondary | ICD-10-CM | POA: Diagnosis present

## 2011-07-30 DIAGNOSIS — E162 Hypoglycemia, unspecified: Secondary | ICD-10-CM | POA: Diagnosis present

## 2011-07-30 DIAGNOSIS — G43909 Migraine, unspecified, not intractable, without status migrainosus: Secondary | ICD-10-CM | POA: Insufficient documentation

## 2011-07-30 DIAGNOSIS — N189 Chronic kidney disease, unspecified: Secondary | ICD-10-CM

## 2011-07-30 DIAGNOSIS — Z79899 Other long term (current) drug therapy: Secondary | ICD-10-CM | POA: Insufficient documentation

## 2011-07-30 DIAGNOSIS — E538 Deficiency of other specified B group vitamins: Secondary | ICD-10-CM

## 2011-07-30 DIAGNOSIS — R0789 Other chest pain: Principal | ICD-10-CM | POA: Insufficient documentation

## 2011-07-30 DIAGNOSIS — D510 Vitamin B12 deficiency anemia due to intrinsic factor deficiency: Secondary | ICD-10-CM | POA: Insufficient documentation

## 2011-07-30 DIAGNOSIS — G4730 Sleep apnea, unspecified: Secondary | ICD-10-CM | POA: Insufficient documentation

## 2011-07-30 DIAGNOSIS — I10 Essential (primary) hypertension: Secondary | ICD-10-CM

## 2011-07-30 DIAGNOSIS — Z9884 Bariatric surgery status: Secondary | ICD-10-CM | POA: Insufficient documentation

## 2011-07-30 DIAGNOSIS — E039 Hypothyroidism, unspecified: Secondary | ICD-10-CM | POA: Diagnosis present

## 2011-07-30 DIAGNOSIS — M7989 Other specified soft tissue disorders: Secondary | ICD-10-CM | POA: Diagnosis present

## 2011-07-30 DIAGNOSIS — G4733 Obstructive sleep apnea (adult) (pediatric): Secondary | ICD-10-CM | POA: Diagnosis present

## 2011-07-30 DIAGNOSIS — F341 Dysthymic disorder: Secondary | ICD-10-CM | POA: Insufficient documentation

## 2011-07-30 DIAGNOSIS — D509 Iron deficiency anemia, unspecified: Secondary | ICD-10-CM | POA: Insufficient documentation

## 2011-07-30 DIAGNOSIS — R0602 Shortness of breath: Secondary | ICD-10-CM | POA: Insufficient documentation

## 2011-07-30 DIAGNOSIS — E669 Obesity, unspecified: Secondary | ICD-10-CM | POA: Insufficient documentation

## 2011-07-30 DIAGNOSIS — M810 Age-related osteoporosis without current pathological fracture: Secondary | ICD-10-CM | POA: Insufficient documentation

## 2011-07-30 DIAGNOSIS — E559 Vitamin D deficiency, unspecified: Secondary | ICD-10-CM | POA: Insufficient documentation

## 2011-07-30 LAB — CBC
HCT: 35.1 % — ABNORMAL LOW (ref 36.0–46.0)
Hemoglobin: 11.4 g/dL — ABNORMAL LOW (ref 12.0–15.0)
MCV: 94.1 fL (ref 78.0–100.0)
RBC: 3.73 MIL/uL — ABNORMAL LOW (ref 3.87–5.11)
WBC: 6 10*3/uL (ref 4.0–10.5)

## 2011-07-30 LAB — BASIC METABOLIC PANEL
BUN: 29 mg/dL — ABNORMAL HIGH (ref 6–23)
CO2: 23 mEq/L (ref 19–32)
Chloride: 104 mEq/L (ref 96–112)
Creatinine, Ser: 0.72 mg/dL (ref 0.50–1.10)
GFR calc Af Amer: >90 mL/min (ref >90)
Potassium: 4.2 mEq/L (ref 3.5–5.1)

## 2011-07-30 MED ORDER — ASPIRIN 325 MG PO TABS
325.0000 mg | ORAL_TABLET | ORAL | Status: AC
  Administered 2011-07-30: 325 mg via ORAL
  Filled 2011-07-30: qty 1

## 2011-07-30 MED ORDER — NITROGLYCERIN 0.4 MG SL SUBL
SUBLINGUAL_TABLET | SUBLINGUAL | Status: AC
  Administered 2011-07-30: 23:00:00
  Filled 2011-07-30: qty 25

## 2011-07-30 NOTE — ED Notes (Signed)
1 more nitro tablet given due to patients continued pain and chest pressure.

## 2011-07-30 NOTE — ED Notes (Signed)
RN to obtain labs with start of IV 

## 2011-07-30 NOTE — ED Notes (Signed)
Pt transported to xray 

## 2011-07-30 NOTE — ED Notes (Signed)
Nitro stat given additional time.

## 2011-07-30 NOTE — ED Notes (Signed)
Pt with chest pain that began this morning at 1000, radiates into shoulders and back and feels like a heavy pressure. Pt reports new onset of edema in BLE that is pitting and began on Thursday. Pt also reports nausea dn headache. Has past history of joint pains and first thought this was due to shoulder pain that is chronic, but felt anxious and deep pressure in chest all day that became worse tonight. Pain rated 9/10. VSS.

## 2011-07-30 NOTE — ED Provider Notes (Signed)
History     CSN: [id number]  Arrival date & time 07/30/11  2217   First MD Initiated Contact with Patient 07/30/11 2305      Chief Complaint  Patient presents with  . Chest Pain    (Consider location/radiation/quality/duration/timing/severity/associated sxs/prior treatment) HPI This is a 51 year old white female who's had chest pain since about 10:00 this morning. She states the pain is sharp, substernal and radiates to the back and shoulders. It is worse with deep breathing. The pain has varied in intensity throughout the day but has always been present since it began. The pain is moderate to severe at its worst, that is when she takes a deep breath. It is not like pain she has had in the past due to degenerative disc disease. She has equivocal dyspnea with it. She has had no nausea, vomiting, diarrhea, abdominal pain, fever, chills or diaphoresis. She has had edema of her legs for the past 3 days.  Past Medical History  Diagnosis Date  . Osteoporosis   . Hypothyroidism   . Diabetes mellitus   . Exogenous obesity   . Rhinitis   . Arthritis   . Fibromyalgia   . Somnolence   . Sleep apnea   . Anemia, iron deficiency 04/06/2011  . Pernicious anemia 04/06/2011    Past Surgical History  Procedure Date  . Gastric bypass   . Cholecystectomy   . Tracheostomy   . Uvulectomy   . Tonsillectomy   . Mandible reconstruction   . Spinal fusion   . Dilation and curettage of uterus   . Cesarean section     No family history on file.  History  Substance Use Topics  . Smoking status: Never Smoker   . Smokeless tobacco: Not on file  . Alcohol Use: No    OB History    Grav Para Term Preterm Abortions TAB SAB Ect Mult Living                  Review of Systems  All other systems reviewed and are negative.    Allergies  Iodine; Iron; Sulfonamide derivatives; Benazepril hcl; Codeine; Penicillins; Amitriptyline; and Lotensin  Home Medications   Current Outpatient Rx  Name  Route Sig Dispense Refill  . ALPRAZOLAM 1 MG PO TABS  Take 1 tablet, IF NEEDED, every 8 hours for anxiety 90 tablet 0  . BIOTIN 5000 MCG PO CAPS Oral Take 5,000 mcg by mouth 2 (two) times daily.    Marland Kitchen CALCIUM + D PO Oral Take by mouth every morning. Has magnesium and zinc in it also.    . CYCLOBENZAPRINE HCL 10 MG PO TABS Oral Take 10 mg by mouth 4 (four) times daily.     Marland Kitchen DICLOFENAC SODIUM 75 MG PO TBEC Oral Take 75 mg by mouth 3 (three) times daily.     Marland Kitchen ESCITALOPRAM OXALATE 20 MG PO TABS Oral Take 20 mg by mouth daily.     Marland Kitchen LEVOTHYROXINE SODIUM 137 MCG PO TABS Oral Take 137 mcg by mouth 2 (two) times daily.    Marland Kitchen METHADONE HCL 10 MG PO TABS Oral Take 10 mg by mouth every 8 (eight) hours.      . MULTIVITAL PO Oral Take by mouth every morning.    . OXYCODONE-ACETAMINOPHEN 10-325 MG PO TABS Oral Take 1 tablet by mouth every 6 (six) hours as needed.      Marland Kitchen PROMETHAZINE HCL 25 MG PO TABS Oral Take 25 mg by mouth at bedtime as needed.      Marland Kitchen  RIZATRIPTAN BENZOATE 10 MG PO TABS Oral Take 10 mg by mouth as needed. May repeat in 2 hours if needed     . TOPIRAMATE 200 MG PO TABS Oral Take 200 mg by mouth at bedtime.      . TRIAMTERENE-HCTZ 75-50 MG PO TABS Oral Take 1 tablet by mouth daily.      Marland Kitchen VITAMIN D (ERGOCALCIFEROL) 50000 UNITS PO CAPS Oral Take 50,000 Units by mouth 2 (two) times a week.      . ALPRAZOLAM 1 MG PO TABS Oral Take 1 tablet (1 mg total) by mouth at bedtime as needed for sleep. 90 tablet 1    BP 97/59  Pulse 89  Temp 98.5 F (36.9 C) (Oral)  Resp 14  Ht 5\' 9"  (1.753 m)  Wt 180 lb (81.647 kg)  BMI 26.58 kg/m2  SpO2 100%  Physical Exam General: Well-developed, well-nourished female in no acute distress; appearance consistent with age of record HENT: normocephalic, atraumatic Eyes: pupils equal round and reactive to light; extraocular muscles intact Neck: supple Heart: regular rate and rhythm; no murmurs, rubs or gallops Chest: Nontender Lungs: clear to auscultation  bilaterally Abdomen: soft; nondistended; nontender; bowel sounds present Extremities: No deformity; full range of motion; edema of lower legs, right greater than left Neurologic: Awake, alert and oriented; motor function intact in all extremities and symmetric; no facial droop Skin: Warm and dry    ED Course  Procedures (including critical care time)     MDM   Nursing notes and vitals signs, including pulse oximetry, reviewed.  Summary of this visit's results, reviewed by myself:  Labs:  Results for orders placed during the hospital encounter of 07/30/11  CBC      Component Value Range   WBC 6.0  4.0 - 10.5 K/uL   RBC 3.73 (*) 3.87 - 5.11 MIL/uL   Hemoglobin 11.4 (*) 12.0 - 15.0 g/dL   HCT 16.1 (*) 09.6 - 04.5 %   MCV 94.1  78.0 - 100.0 fL   MCH 30.6  26.0 - 34.0 pg   MCHC 32.5  30.0 - 36.0 g/dL   RDW 40.9  81.1 - 91.4 %   Platelets 183  150 - 400 K/uL  BASIC METABOLIC PANEL      Component Value Range   Sodium 135  135 - 145 mEq/L   Potassium 4.2  3.5 - 5.1 mEq/L   Chloride 104  96 - 112 mEq/L   CO2 23  19 - 32 mEq/L   Glucose, Bld 79  70 - 99 mg/dL   BUN 29 (*) 6 - 23 mg/dL   Creatinine, Ser 7.82  0.50 - 1.10 mg/dL   Calcium 8.5  8.4 - 95.6 mg/dL   GFR calc non Af Amer >90  >90 mL/min   GFR calc Af Amer >90  >90 mL/min  PRO B NATRIURETIC PEPTIDE      Component Value Range   Pro B Natriuretic peptide (BNP) 206.2 (*) 0 - 125 pg/mL  TROPONIN I      Component Value Range   Troponin I <0.30  <0.30 ng/mL  D-DIMER, QUANTITATIVE      Component Value Range   D-Dimer, Quant 0.22  0.00 - 0.48 ug/mL-FEU  TROPONIN I      Component Value Range   Troponin I <0.30  <0.30 ng/mL    Imaging Studies: Dg Chest 2 View  07/30/2011  *RADIOLOGY REPORT*  Clinical Data: Mid chest pain radiates into the back between shoulder blades.  Shortness of breath.  Family history of heart disease.  CHEST - 2 VIEW  Comparison: 07/01/2006  Findings: Cardiomediastinal silhouette is within normal  limits. The lungs are hyperinflated.  There are no focal consolidations or pleural effusions.  No evidence for pulmonary edema.  The patient has had prior lower cervical fusion.  The degenerative changes are seen in the thoracic spine.  IMPRESSION: No evidence for acute cardiopulmonary abnormality.  Original Report Authenticated By: Patterson Hammersmith, M.D.      EKG Interpretation:  Date & Time: 07/30/2011 10:21 PM  Rate: 83  Rhythm: normal sinus rhythm  QRS Axis: normal  Intervals: PR prolonged  ST/T Wave abnormalities: normal  Conduction Disutrbances:first-degree A-V block   Narrative Interpretation:   Old EKG Reviewed: PR is longer  7:00AM Patient continues to have pain but not as severe as earlier. She states she did have some partial relief with nitroglycerin. Her cardiac markers have been negative. We will have her admitted for chest pain rule out the        Hanley Seamen, MD 07/31/11 [id number]

## 2011-07-31 ENCOUNTER — Encounter (HOSPITAL_COMMUNITY): Payer: Self-pay

## 2011-07-31 DIAGNOSIS — M129 Arthropathy, unspecified: Secondary | ICD-10-CM

## 2011-07-31 DIAGNOSIS — D649 Anemia, unspecified: Secondary | ICD-10-CM

## 2011-07-31 DIAGNOSIS — M7989 Other specified soft tissue disorders: Secondary | ICD-10-CM

## 2011-07-31 DIAGNOSIS — F418 Other specified anxiety disorders: Secondary | ICD-10-CM | POA: Diagnosis present

## 2011-07-31 DIAGNOSIS — E039 Hypothyroidism, unspecified: Secondary | ICD-10-CM

## 2011-07-31 DIAGNOSIS — R079 Chest pain, unspecified: Secondary | ICD-10-CM | POA: Diagnosis present

## 2011-07-31 DIAGNOSIS — F341 Dysthymic disorder: Secondary | ICD-10-CM

## 2011-07-31 DIAGNOSIS — E119 Type 2 diabetes mellitus without complications: Secondary | ICD-10-CM

## 2011-07-31 LAB — T4, FREE: Free T4: 1.56 ng/dL (ref 0.80–1.80)

## 2011-07-31 LAB — CARDIAC PANEL(CRET KIN+CKTOT+MB+TROPI)
Relative Index: INVALID (ref 0.0–2.5)
Relative Index: INVALID (ref 0.0–2.5)
Total CK: 71 U/L (ref 7–177)
Troponin I: <0.3 ng/mL (ref <0.30)

## 2011-07-31 LAB — GLUCOSE, CAPILLARY: Glucose-Capillary: 84 mg/dL (ref 70–99)

## 2011-07-31 LAB — HEPATIC FUNCTION PANEL
AST: 77 U/L — ABNORMAL HIGH (ref 0–37)
Albumin: 3.1 g/dL — ABNORMAL LOW (ref 3.5–5.2)
Bilirubin, Direct: 0.1 mg/dL (ref 0.0–0.3)

## 2011-07-31 LAB — D-DIMER, QUANTITATIVE: D-Dimer, Quant: 0.22 ug/mL-FEU (ref 0.00–0.48)

## 2011-07-31 LAB — LIPID PANEL
LDL Cholesterol: 64 mg/dL (ref 0–99)
VLDL: 17 mg/dL (ref 0–40)

## 2011-07-31 LAB — TSH: TSH: 0.049 u[IU]/mL — ABNORMAL LOW (ref 0.350–4.500)

## 2011-07-31 LAB — TROPONIN I: Troponin I: <0.3 ng/mL (ref <0.30)

## 2011-07-31 LAB — PHOSPHORUS: Phosphorus: 4.3 mg/dL (ref 2.3–4.6)

## 2011-07-31 MED ORDER — MORPHINE SULFATE 4 MG/ML IJ SOLN
3.0000 mg | INTRAMUSCULAR | Status: DC | PRN
  Administered 2011-07-31 – 2011-08-02 (×6): 3 mg via INTRAVENOUS
  Filled 2011-07-31 (×7): qty 1

## 2011-07-31 MED ORDER — OXYCODONE HCL 5 MG PO TABS
5.0000 mg | ORAL_TABLET | Freq: Four times a day (QID) | ORAL | Status: DC | PRN
  Administered 2011-08-02: 5 mg via ORAL
  Filled 2011-07-31: qty 1

## 2011-07-31 MED ORDER — ACETAMINOPHEN 325 MG PO TABS: 650.0000 mg | ORAL_TABLET | Freq: Four times a day (QID) | ORAL | Status: DC | PRN

## 2011-07-31 MED ORDER — SODIUM CHLORIDE 0.9 % IJ SOLN
3.0000 mL | Freq: Two times a day (BID) | INTRAMUSCULAR | Status: DC
  Administered 2011-07-31 (×2): 3 mL via INTRAVENOUS

## 2011-07-31 MED ORDER — METHADONE HCL 10 MG PO TABS
10.0000 mg | ORAL_TABLET | Freq: Three times a day (TID) | ORAL | Status: DC
  Administered 2011-07-31 – 2011-08-02 (×7): 10 mg via ORAL
  Filled 2011-07-31 (×7): qty 1

## 2011-07-31 MED ORDER — ONDANSETRON HCL 4 MG/2ML IJ SOLN
4.0000 mg | Freq: Four times a day (QID) | INTRAMUSCULAR | Status: DC | PRN
  Administered 2011-07-31 – 2011-08-02 (×2): 4 mg via INTRAVENOUS
  Filled 2011-07-31 (×2): qty 2

## 2011-07-31 MED ORDER — OXYCODONE-ACETAMINOPHEN 10-325 MG PO TABS: 1.0000 | ORAL_TABLET | Freq: Four times a day (QID) | ORAL | Status: DC | PRN

## 2011-07-31 MED ORDER — BIOTIN 5000 MCG PO CAPS: 5000.0000 ug | ORAL_CAPSULE | Freq: Two times a day (BID) | ORAL | Status: DC

## 2011-07-31 MED ORDER — ALPRAZOLAM 0.5 MG PO TABS
0.5000 mg | ORAL_TABLET | Freq: Three times a day (TID) | ORAL | Status: DC | PRN
  Administered 2011-07-31 – 2011-08-02 (×2): 0.5 mg via ORAL
  Filled 2011-07-31 (×2): qty 1

## 2011-07-31 MED ORDER — CYCLOBENZAPRINE HCL 10 MG PO TABS
10.0000 mg | ORAL_TABLET | Freq: Four times a day (QID) | ORAL | Status: DC
  Administered 2011-07-31 – 2011-08-02 (×8): 10 mg via ORAL
  Filled 2011-07-31 (×13): qty 1

## 2011-07-31 MED ORDER — ONDANSETRON HCL 4 MG PO TABS: 4.0000 mg | ORAL_TABLET | Freq: Four times a day (QID) | ORAL | Status: DC | PRN

## 2011-07-31 MED ORDER — OXYCODONE-ACETAMINOPHEN 5-325 MG PO TABS
1.0000 | ORAL_TABLET | Freq: Four times a day (QID) | ORAL | Status: DC | PRN
  Administered 2011-07-31 – 2011-08-02 (×3): 1 via ORAL
  Filled 2011-07-31 (×3): qty 1

## 2011-07-31 MED ORDER — TOPIRAMATE 100 MG PO TABS
200.0000 mg | ORAL_TABLET | Freq: Every day | ORAL | Status: DC
  Administered 2011-07-31 – 2011-08-01 (×2): 200 mg via ORAL
  Filled 2011-07-31 (×3): qty 2

## 2011-07-31 MED ORDER — PANTOPRAZOLE SODIUM 40 MG PO TBEC
40.0000 mg | DELAYED_RELEASE_TABLET | Freq: Two times a day (BID) | ORAL | Status: DC
  Administered 2011-08-01 – 2011-08-02 (×3): 40 mg via ORAL
  Filled 2011-07-31 (×5): qty 1

## 2011-07-31 MED ORDER — SUMATRIPTAN SUCCINATE 100 MG PO TABS
100.0000 mg | ORAL_TABLET | Freq: Once | ORAL | Status: AC
  Administered 2011-07-31: 100 mg via ORAL
  Filled 2011-07-31: qty 1

## 2011-07-31 MED ORDER — SODIUM CHLORIDE 0.9 % IV SOLN
1020.0000 mg | Freq: Once | INTRAVENOUS | Status: DC
  Filled 2011-07-31: qty 34

## 2011-07-31 MED ORDER — SODIUM CHLORIDE 0.9 % IV SOLN
INTRAVENOUS | Status: DC
  Administered 2011-07-31: 20 mL/h via INTRAVENOUS

## 2011-07-31 MED ORDER — ESCITALOPRAM OXALATE 20 MG PO TABS
20.0000 mg | ORAL_TABLET | Freq: Every day | ORAL | Status: DC
  Administered 2011-07-31 – 2011-08-02 (×3): 20 mg via ORAL
  Filled 2011-07-31 (×3): qty 1

## 2011-07-31 MED ORDER — ACETAMINOPHEN 650 MG RE SUPP: 650.0000 mg | Freq: Four times a day (QID) | RECTAL | Status: DC | PRN

## 2011-07-31 MED ORDER — CALCIUM CARBONATE-VITAMIN D 500-200 MG-UNIT PO TABS
1.0000 | ORAL_TABLET | Freq: Every day | ORAL | Status: DC
  Administered 2011-07-31 – 2011-08-02 (×3): 1 via ORAL
  Filled 2011-07-31 (×3): qty 1

## 2011-07-31 MED ORDER — LEVOTHYROXINE SODIUM 137 MCG PO TABS
137.0000 ug | ORAL_TABLET | Freq: Two times a day (BID) | ORAL | Status: DC
  Administered 2011-07-31 – 2011-08-01 (×3): 137 ug via ORAL
  Filled 2011-07-31 (×5): qty 1

## 2011-07-31 MED ORDER — ASPIRIN EC 81 MG PO TBEC
81.0000 mg | DELAYED_RELEASE_TABLET | Freq: Every day | ORAL | Status: DC
  Administered 2011-07-31 – 2011-08-02 (×3): 81 mg via ORAL
  Filled 2011-07-31 (×3): qty 1

## 2011-07-31 MED ORDER — GI COCKTAIL ~~LOC~~
30.0000 mL | Freq: Once | ORAL | Status: AC
  Administered 2011-07-31: 30 mL via ORAL
  Filled 2011-07-31: qty 30

## 2011-07-31 NOTE — H&P (Signed)
[facility] History and Physical  Valerie Silva ZOX:[id number] DOB: 1960-07-08 DOA: 07/30/2011  Referring physician: Dr. Read Drivers PCP: [name], MD   Chief Complaint: Chest pain  HPI:  51 year old female with a past medical history significant for hypothyroidism, hypertension, chronic anemia, diabetes (currently treated with diet); who came to the emergency department secondary to chest pain, substernal, intermittent and with associated shortness of breath. Patient reports that the pain has been present for about 14 hours, is sharp in nature and radiates to her back and shoulders; is worse with deep inspirations. Patient denies any nausea, vomiting, diarrhea, abdominal pain, fever/chills, diaphoresis, headaches or palpitations. Patient endorses some edema of her lower extremities that has been going on for the last 3 days.  Of note, she has received prednisone recently as part of treatment of her chronic degenerative joint disease that has been acting up.  Review of Systems:  Negative except otherwise as mentioned on history of present illness.  Past Medical History  Diagnosis Date  . Osteoporosis   . Hypothyroidism   . Diabetes mellitus   . Exogenous obesity   . Rhinitis   . Arthritis   . Fibromyalgia   . Somnolence   . Sleep apnea   . Anemia, iron deficiency 04/06/2011  . Pernicious anemia 04/06/2011   Past Surgical History  Procedure Date  . Gastric bypass   . Cholecystectomy   . Tracheostomy   . Uvulectomy   . Tonsillectomy   . Mandible reconstruction   . Spinal fusion   . Dilation and curettage of uterus   . Cesarean section    Social History:  reports that she has never smoked. She does not have any smokeless tobacco history on file. She reports that she does not drink alcohol. Her drug history not on file. patient from home and planning to return there at discharge. Able to perform activities of daily living without assistance.   Allergies  Allergen  Reactions  . Iodine Other (See Comments)    Sensitive when used gynecologically (intra-vaginally)  . Iron Itching    Iron taken by mouth causes this reaction the patient has had several iron infusions plus Feraheme that has not caused any side effects  . Sulfonamide Derivatives Other (See Comments)    Sensitive when used intra-vaginally.  . Benazepril Hcl     REACTION: severe cough  . Codeine     REACTION: AMS, feeling of passing out  . Penicillins     REACTION: severe family allergy.  Family physician long ago stated she shouldn't take this med since mother and brother nearly died from anaphylaxis.  . Amitriptyline     Sleep walking  . Lotensin (Benazepril)     cough    Family history: Father with history of coronary artery disease and to bypass surgery: Mother with history of a stroke and known cancer. Also history of hypertension and hyperlipidemia.  Prior to Admission medications   Medication Sig Start Date End Date Taking? Authorizing Provider  ALPRAZolam Prudy Feeler) 1 MG tablet Take 1 tablet, IF NEEDED, every 8 hours for anxiety 07/19/11  Yes Josph Macho, MD  Biotin 5000 MCG CAPS Take 5,000 mcg by mouth 2 (two) times daily.   Yes Historical Provider, MD  Calcium Carbonate-Vitamin D (CALCIUM + D PO) Take by mouth every morning. Has magnesium and zinc in it also.   Yes Historical Provider, MD  cyclobenzaprine (FLEXERIL) 10 MG tablet Take 10 mg by mouth 4 (four) times daily.    Yes  Historical Provider, MD  diclofenac (VOLTAREN) 75 MG EC tablet Take 75 mg by mouth 3 (three) times daily.    Yes Historical Provider, MD  escitalopram (LEXAPRO) 20 MG tablet Take 20 mg by mouth daily.    Yes Historical Provider, MD  levothyroxine (SYNTHROID, LEVOTHROID) 137 MCG tablet Take 137 mcg by mouth 2 (two) times daily.   Yes Historical Provider, MD  methadone (DOLOPHINE) 10 MG tablet Take 10 mg by mouth every 8 (eight) hours.     Yes Historical Provider, MD  Multiple Vitamins-Minerals (MULTIVITAL  PO) Take by mouth every morning.   Yes Historical Provider, MD  oxyCODONE-acetaminophen (PERCOCET) 10-325 MG per tablet Take 1 tablet by mouth every 6 (six) hours as needed.     Yes Historical Provider, MD  promethazine (PHENERGAN) 25 MG tablet Take 25 mg by mouth at bedtime as needed.     Yes Historical Provider, MD  rizatriptan (MAXALT) 10 MG tablet Take 10 mg by mouth as needed. May repeat in 2 hours if needed    Yes Historical Provider, MD  topiramate (TOPAMAX) 200 MG tablet Take 200 mg by mouth at bedtime.     Yes Historical Provider, MD  triamterene-hydrochlorothiazide (MAXZIDE) 75-50 MG per tablet Take 1 tablet by mouth daily.     Yes Historical Provider, MD  Vitamin D, Ergocalciferol, (DRISDOL) 50000 UNITS CAPS Take 50,000 Units by mouth 2 (two) times a week.     Yes Historical Provider, MD  ALPRAZolam Prudy Feeler) 1 MG tablet Take 1 tablet (1 mg total) by mouth at bedtime as needed for sleep. 04/06/11 05/06/11  Josph Macho, MD   Physical Exam: Filed Vitals:   07/31/11 0145 07/31/11 0200 07/31/11 0723 07/31/11 0729  BP: 100/59 97/59 99/62  98/58  Pulse: 90 89 84 83  Temp:      TempSrc:      Resp: 15 14 16 16   Height:      Weight:      SpO2: 100% 100% 100% 100%     General:  No acute distress, able to follow commands; cooperative to examination.  Eyes: PERRLA, extraocular muscles intact, no icterus, no nystagmus  Throat: Moist mucous membranes, no erythema or exudates; good dentition (with missing frontal teeth)  Neck: Supple, eschar at the base of her neck from previous cervical surgeries. No bruits  Cardiovascular: Regular rate and rhythm, no murmurs, no gallops or rubs.  Respiratory: Good air movement, no crackles, no wheezing.  Abdomen: Soft, nontender/nondistended; no guarding, positive bowel sounds.  Skin: No rash or petechiae.  Musculoskeletal: Patient described pain in multiple joints; no swelling or erythema appreciated on exam on any of those joints.     Psychiatric: Stable and Appropriate.  Neurologic: Alert, awake and oriented x3; cranial nerve 2-12 grossly intact, muscle strength 4/5 bilaterally (secondary to poor effort do to generalize joint pain); no focal motor or sensory deficit.  Labs on Admission:  Basic Metabolic Panel:  Lab 07/30/11 1610  NA 135  K 4.2  CL 104  CO2 23  GLUCOSE 79  BUN 29*  CREATININE 0.72  CALCIUM 8.5  MG --  PHOS --   CBC:  Lab 07/30/11 2300  WBC 6.0  NEUTROABS --  HGB 11.4*  HCT 35.1*  MCV 94.1  PLT 183   Cardiac Enzymes:  Lab 07/31/11 0455 07/31/11 0245  CKTOTAL -- --  CKMB -- --  CKMBINDEX -- --  TROPONINI <0.30 <0.30    BNP (last 3 results)  Basename 07/31/11 0245  PROBNP  206.2*    Radiological Exams on Admission: Dg Chest 2 View  07/30/2011  *RADIOLOGY REPORT*  Clinical Data: Mid chest pain radiates into the back between shoulder blades.  Shortness of breath.  Family history of heart disease.  CHEST - 2 VIEW  Comparison: 07/01/2006  Findings: Cardiomediastinal silhouette is within normal limits. The lungs are hyperinflated.  There are no focal consolidations or pleural effusions.  No evidence for pulmonary edema.  The patient has had prior lower cervical fusion.  The degenerative changes are seen in the thoracic spine.  IMPRESSION: No evidence for acute cardiopulmonary abnormality.  Original Report Authenticated By: Patterson Hammersmith, M.D.    EKG: Independently reviewed. Sinus rhythm; normal axis; first degree AV block; nonspecific T waves flattening and inversion in the anterolateral leads. Otherwise unremarkable   Assessment/Plan 1-Chest pain: atypical but with some typical features (worse with exertion); also with risk factors (hypertension, diabetes, family history for coronary artery disease and CHF). Will admit to telemetry; cycle cardiac enzymes; check a 2-D echo; start aspirin and depending results and involution will might need to involve cardiology for stress test;  beta blockers will be held at this point due to soft blood pressure. Will check TSH, A1c and lipid panel as part of stratification workup. Other considerations will be gastroesophageal reflux disease/esophagitis as part of her chest pain (especially with recent prednisone use and chronic use of diclofenac), will give Protonix twice a day and one time dose of GI cocktail.  2-HYPOTHYROIDISM: Continue current dose of Synthroid; will check TSH and free T4.  3-DIABETES MELLITUS: Had history of diabetes (borderline) managed with diet at this point. We'll check hemoglobin A1c and CBGs fasting in the morning; depending results will decide if she needs to be placed or not on sliding scale or any other hypoglycemic regimen.  4-ARTHRITIS: Chronic. Appears to be a stable will continue home pain regimen (methadone 3 times a day and breakthrough Percocet)  5-Leg swelling: Concern of this lower strandy swelling to be associated with development of heart failure (patient with history of his sleep apnea and potential pulmonary hypertension with right heart failure); will follow 2-D echo. Will check daily weights and close intake and output.  6-Depression with anxiety: Stable. No suicidal ideation. Will continue Lexapro and when necessary Xanax.  7-Chronic anemia: Do to pernicious anemia and iron deficiency. Patient will continue treatment as an outpatient (followed by Dr. Zachery Dakins). Hemoglobin 11.7 at this point no need for transfusion.  8-history of migraines: Currently without headaches; continue Topamax.  DVT prophylaxis SCDs.   Code Status: Full Family Communication: No family at bedside; patient updated of plan of care and all questions answered. Disposition Plan: Home when medically stable  Time spent: More than 30 minutes  Marsa Matteo [facility] Pager [phone]  If 7PM-7AM, please contact night-coverage [email or web address] Password [facility] 07/31/2011, 8:26 AM

## 2011-07-31 NOTE — ED Notes (Signed)
Attempted report, RN unavailable.

## 2011-07-31 NOTE — ED Notes (Signed)
Report attempted, RN unavailable.

## 2011-07-31 NOTE — ED Notes (Signed)
Report given to Rn 4 west

## 2011-08-01 DIAGNOSIS — I519 Heart disease, unspecified: Secondary | ICD-10-CM

## 2011-08-01 DIAGNOSIS — D510 Vitamin B12 deficiency anemia due to intrinsic factor deficiency: Secondary | ICD-10-CM

## 2011-08-01 DIAGNOSIS — D509 Iron deficiency anemia, unspecified: Secondary | ICD-10-CM

## 2011-08-01 LAB — CBC
HCT: 32.7 % — ABNORMAL LOW (ref 36.0–46.0)
MCV: 96.2 fL (ref 78.0–100.0)
RBC: 3.4 MIL/uL — ABNORMAL LOW (ref 3.87–5.11)
RDW: 13.2 % (ref 11.5–15.5)
WBC: 3.6 10*3/uL — ABNORMAL LOW (ref 4.0–10.5)

## 2011-08-01 LAB — BASIC METABOLIC PANEL
CO2: 24 mEq/L (ref 19–32)
Chloride: 105 mEq/L (ref 96–112)
Creatinine, Ser: 0.63 mg/dL (ref 0.50–1.10)
Glucose, Bld: 81 mg/dL (ref 70–99)

## 2011-08-01 LAB — CARDIAC PANEL(CRET KIN+CKTOT+MB+TROPI): Relative Index: INVALID (ref 0.0–2.5)

## 2011-08-01 MED ORDER — SUMATRIPTAN SUCCINATE 100 MG PO TABS
100.0000 mg | ORAL_TABLET | Freq: Two times a day (BID) | ORAL | Status: DC | PRN
  Administered 2011-08-01 – 2011-08-02 (×2): 100 mg via ORAL
  Filled 2011-08-01: qty 1

## 2011-08-01 MED ORDER — LEVOTHYROXINE SODIUM 137 MCG PO TABS
137.0000 ug | ORAL_TABLET | Freq: Every day | ORAL | Status: DC
  Administered 2011-08-02: 137 ug via ORAL
  Filled 2011-08-01 (×2): qty 1

## 2011-08-01 MED ORDER — SUMATRIPTAN SUCCINATE 100 MG PO TABS
100.0000 mg | ORAL_TABLET | ORAL | Status: DC | PRN
  Filled 2011-08-01: qty 1

## 2011-08-01 MED ORDER — METHYLPREDNISOLONE SODIUM SUCC 40 MG IJ SOLR
40.0000 mg | Freq: Once | INTRAMUSCULAR | Status: AC
  Administered 2011-08-01: 40 mg via INTRAVENOUS
  Filled 2011-08-01: qty 1

## 2011-08-01 MED ORDER — SODIUM CHLORIDE 0.9 % IV SOLN
1020.0000 mg | Freq: Once | INTRAVENOUS | Status: AC
  Administered 2011-08-01: 1020 mg via INTRAVENOUS
  Filled 2011-08-01: qty 34

## 2011-08-01 MED ORDER — DIPHENHYDRAMINE HCL 50 MG/ML IJ SOLN
25.0000 mg | Freq: Once | INTRAMUSCULAR | Status: AC
  Administered 2011-08-01: 25 mg via INTRAVENOUS

## 2011-08-01 NOTE — Progress Notes (Signed)
Patient prefers to have bed up higher. I informed her we keep it low because of the fall risk but she still insisted on having it up high.

## 2011-08-01 NOTE — Progress Notes (Signed)
  Echocardiogram 2D Echocardiogram has been performed.  Valerie Silva 08/01/2011, 10:03 AM

## 2011-08-01 NOTE — Progress Notes (Signed)
[facility] PROGRESS NOTE  Valerie Silva HYQ:[id number] DOB: February 28, 1960 DOA: 07/30/2011 PCP: [name], MD  Assessment/Plan: 1-Chest pain: so far work up is demonstrating no cardiac in etiology. Will wait for 2-D echo results and if no abnormalities with d/c home. Continue PPI.  2-HYPOTHYROIDISM:TSH low and demonstrating overcorrection. Will change dose of levothyroxine to once a day and she will follow TSH and free T4 with PCP in 6-8 weeks.  3-DIABETES MELLITUS: A1C 4.7; no diabetic base on guidelines. No treatment indicated at this point.  4-ARTHRITIS: continue current pain medication regimen.  5-SLEEP APNEA:when she was obese; now after gastric bypass has no longer required oxygen.  6-Leg swelling:no swelling today.  7-Depression with anxiety: continue home antidepressant and anxiolytics  8-Chronic anemia: per Hem/onc will infuse iron and she will follow with oncology service at the office. Patient with pernicious anemia (B12 anemia) and iron deficiency  9-Vit D deficiency: continue vit D.  Code Status: Full Disposition Plan: home when stable   Brief narrative: 51 year old female with a past medical history significant for hypothyroidism, hypertension, chronic anemia, pre-diabetes (currently treated with diet); who came to the emergency department secondary to chest pain, substernal, intermittent and with associated shortness of breath.    Consultants:  none  Procedures:  2-D echo pending  Antibiotics:  none  HPI/Subjective: Reports improvement in her CP with protonix; but still has some discomfort intermittently radiated to her back (?? If is her chronic back pain causing referred CP). Afebrile.   Objective: Filed Vitals:   07/31/11 0836 07/31/11 0913 07/31/11 1453 08/01/11 0505  BP: 104/65  101/64 96/70  Pulse: 80  74 78  Temp: 98.4 F (36.9 C)  98 F (36.7 C) 98.3 F (36.8 C)  TempSrc:   Axillary Oral  Resp: 18  19 18   Height:  5\' 9"  (1.753  m)    Weight:  84.9 kg (187 lb 2.7 oz)    SpO2: 99%  97% 97%    Intake/Output Summary (Last 24 hours) at 08/01/11 1348 Last data filed at 08/01/11 1201  Gross per 24 hour  Intake    480 ml  Output   1900 ml  Net  -1420 ml    Exam:   General: NAD.  Cardiovascular: RRR; no rubs or gallops.  Respiratory: CTA bilaterally  Abdomen: soft; NT/ND and with positive BS  Neurologic: no focal motor or sensory deficit.   Data Reviewed: Basic Metabolic Panel:  Lab 08/01/11 9528 07/31/11 1034 07/30/11 2300  NA 137 -- 135  K 4.0 -- 4.2  CL 105 -- 104  CO2 24 -- 23  GLUCOSE 81 -- 79  BUN 15 -- 29*  CREATININE 0.63 -- 0.72  CALCIUM 8.4 -- 8.5  MG -- 2.5 --  PHOS -- 4.3 --   Liver Function Tests:  Lab 07/31/11 1034  AST 77*  ALT 58*  ALKPHOS 77  BILITOT 0.4  PROT 6.1  ALBUMIN 3.1*   CBC:  Lab 08/01/11 0316 07/30/11 2300  WBC 3.6* 6.0  NEUTROABS -- --  HGB 10.5* 11.4*  HCT 32.7* 35.1*  MCV 96.2 94.1  PLT 162 183   Cardiac Enzymes:  Lab 08/01/11 0317 07/31/11 1900 07/31/11 1034 07/31/11 0455 07/31/11 0245  CKTOTAL 65 61 71 -- --  CKMB 2.4 1.6 1.6 -- --  CKMBINDEX -- -- -- -- --  TROPONINI <0.30 <0.30 <0.30 <0.30 <0.30   BNP (last 3 results)  Basename 07/31/11 0245  PROBNP 206.2*   CBG:  Lab 07/31/11 1156  GLUCAP 84     Studies: Dg Chest 2 View  07/30/2011  *RADIOLOGY REPORT*  Clinical Data: Mid chest pain radiates into the back between shoulder blades.  Shortness of breath.  Family history of heart disease.  CHEST - 2 VIEW  Comparison: 07/01/2006  Findings: Cardiomediastinal silhouette is within normal limits. The lungs are hyperinflated.  There are no focal consolidations or pleural effusions.  No evidence for pulmonary edema.  The patient has had prior lower cervical fusion.  The degenerative changes are seen in the thoracic spine.  IMPRESSION: No evidence for acute cardiopulmonary abnormality.  Original Report Authenticated By: Patterson Hammersmith, M.D.     Scheduled Meds:   . aspirin EC  81 mg Oral Daily  . calcium-vitamin D  1 tablet Oral Daily  . cyclobenzaprine  10 mg Oral QID  . escitalopram  20 mg Oral Daily  . ferumoxytol  1,020 mg Intravenous Once  . levothyroxine  137 mcg Oral BID  . methadone  10 mg Oral Q8H  . pantoprazole  40 mg Oral BID AC  . sodium chloride  3 mL Intravenous Q12H  . topiramate  200 mg Oral QHS   Continuous Infusions:   . sodium chloride 20 mL/hr (07/31/11 0920)    Time spent: <30 minutes    Jerrel Tiberio  [facility] Pager [phone]. If 8PM-8AM, please contact night-coverage at [email or web address], password [facility] 08/01/2011, 1:48 PM  LOS: 2 days

## 2011-08-02 DIAGNOSIS — F411 Generalized anxiety disorder: Secondary | ICD-10-CM

## 2011-08-02 MED ORDER — ASPIRIN 81 MG PO TBEC: 81.0000 mg | DELAYED_RELEASE_TABLET | Freq: Every day | ORAL | Status: DC

## 2011-08-02 MED ORDER — TRIAMTERENE-HCTZ 75-50 MG PO TABS: 1.0000 | ORAL_TABLET | Freq: Every day | ORAL | Status: DC

## 2011-08-02 MED ORDER — ESCITALOPRAM OXALATE 20 MG PO TABS: 20.0000 mg | ORAL_TABLET | Freq: Every day | ORAL | Status: DC

## 2011-08-02 MED ORDER — OMEPRAZOLE 40 MG PO CPDR: 40.0000 mg | DELAYED_RELEASE_CAPSULE | Freq: Every day | ORAL | Status: DC

## 2011-08-02 MED ORDER — LEVOTHYROXINE SODIUM 137 MCG PO TABS: 137.0000 ug | ORAL_TABLET | Freq: Every day | ORAL | Status: DC

## 2011-08-02 MED FILL — Morphine Sulfate Inj 2 MG/ML: INTRAMUSCULAR | Qty: 2 | Status: AC

## 2011-08-02 MED FILL — Cyclobenzaprine HCl Tab 10 MG: ORAL | Qty: 1 | Status: AC

## 2011-08-02 NOTE — Discharge Summary (Signed)
Physician Discharge Summary  Valerie Silva ZOX:[id number] DOB: [birth year] DOA: 07/30/2011  PCP: [name], MD  Admit date: 07/30/2011 Discharge date: 08/02/2011  Recommendations for Outpatient Follow-up:  1. Follow up with PCP in 2 weeks (follow chronic conditions and determine if patient will required treatment with HCTZ or not; BP soft on admission and HCTZ never used during hospitalization and BP in the 115's range systolic.   Discharge Diagnoses:  1-Non-cardiac Chest pain  2-HYPOTHYROIDISM  3-DIABETES MELLITUS  4-ARTHRITIS  5-Hx of SLEEP APNEA  6-Leg swelling  7-Depression with anxiety  8-Chronic anemia  9-HTN  10-osteoporosis  11-Vit D deficiency  12-Migranes 13-Grade 1 diastolic dysfunction by 2-D echo on 07/2011  Discharge Condition: Discharge in stable and improved condition; no further CP and negative work up to r/o ACS. Patient will follow with PCP in 2 weeks.  Diet recommendation: Heart healthy diet   History of present illness:  51 year old female with a past medical history significant for hypothyroidism, hypertension, chronic anemia, diabetes (currently treated with diet); who came to the emergency department secondary to chest pain, substernal, intermittent and with associated shortness of breath. Patient reports that the pain has been present for about 14 hours, is sharp in nature and radiates to her back and shoulders; is worse with deep inspirations.   Hospital Course:  1-Chest pain: non cardiac in etiology; most likely due to GERD; especially with hx of continue NSAID's use and recent steroids. Will treat with PPI. At discharge no CP. 2-D echo w/o wall motion abnormalities, CE'Z negative X3 and no telemetry or EKG abnormalities.  2-HYPOTHYROIDISM:TSH low and demonstrating overcorrection. Will change dose of levothyroxine to once a day and she will follow TSH and free T4 with PCP in 6-8 weeks.   3-DIABETES MELLITUS: A1C 4.7; no diabetic base on guidelines.  No treatment indicated at this point.   4-ARTHRITIS: continue current pain medication regimen.   5-Hx of SLEEP APNEA:when she was obese; now after gastric bypass has no longer required oxygen.   6-Leg swelling: most likely due to steroids use; other consideration is venous stasis; no swelling during hospital stay; to follow with PCP.    7-Depression with anxiety: continue home antidepressant and anxiolytics   8-Chronic anemia: per Hem/onc will infuse iron and she will follow with oncology service at the office. Patient with pernicious anemia (B12 anemia) and iron deficiency.  9-Vit D deficiency: continue vit D.  10-Diastolic dysfunction: chronic and compensated; no further treatment needed at this point. Patient advised to follow heart healthy diet.  11-HTN: soft BP on admission; antihypertensive agents discontinued. BP remains stable throughout hospitalization. Will continue holding HCTZ at discharge and BP to be reevaluated by PCP.   Procedures:  2-D echo - Left ventricle: The cavity size was normal. Wall thickness was normal. Systolic function was normal. The estimated ejection fraction was in the range of 55% to 60%. Wall motion was normal; there were no regional wall motion abnormalities. Doppler parameters are consistent with abnormal left ventricular relaxation (grade 1 diastolic dysfunction).   Consultations:  None  Discharge Exam: Filed Vitals:   08/02/11 0557  BP: 111/63  Pulse: 78  Temp: 97.5 F (36.4 C)  Resp: 18   Filed Vitals:   08/01/11 0505 08/01/11 1350 08/01/11 2213 08/02/11 0557  BP: 96/70 98/62 110/70 111/63  Pulse: 78 72 72 78  Temp: 98.3 F (36.8 C) 98.1 F (36.7 C) 98 F (36.7 C) 97.5 F (36.4 C)  TempSrc: Oral Oral Oral Oral  Resp: 18  20 18 18   Height:      Weight:      SpO2: 97% 98% 98% 96%   General: NAD Cardiovascular: RRR; no rubs or gallops; S1 and S2. Respiratory: CTA bilaterally Neurologic: no focal motor or sensory deficit  appreciated  Discharge Instructions  Discharge Orders    Future Appointments: Provider: Department: Dept Phone: Center:   08/03/2011 1:30 PM [facility] 3 [facility] [phone] None   08/18/2011 2:30 PM Rachael Fee [facility] [phone] None   08/18/2011 3:00 PM [facility] Inj Nurse [facility] [phone] None   09/22/2011 1:15 PM Rachael Fee [facility] [phone] None   09/22/2011 1:45 PM Josph Macho, MD [facility] [phone] None   09/22/2011 2:15 PM [facility] Inj [facility] [phone] None     Future Orders Please Complete By Expires   Diet - low sodium heart healthy      Discharge instructions      Comments:   -Hold your HCTZ until you follow with PCP -Arrange visit with PCP in 2 weeks -Take medications as prescribed and follow discharge instructions.     Medication List  As of 08/02/2011 11:37 AM   TAKE these medications         ALPRAZolam 1 MG tablet   Commonly known as: XANAX   Take 1 tablet (1 mg total) by mouth at bedtime as needed for sleep.      ALPRAZolam 1 MG tablet   Commonly known as: XANAX   Take 1 tablet, IF NEEDED, every 8 hours for anxiety      aspirin 81 MG EC tablet   Take 1 tablet (81 mg total) by mouth daily.      Biotin 5000 MCG Caps   Take 5,000 mcg by mouth 2 (two) times daily.      CALCIUM + D PO   Take by mouth every morning. Has magnesium and zinc in it also.      cyclobenzaprine 10 MG tablet   Commonly known as: FLEXERIL   Take 10 mg by mouth 4 (four) times daily.      diclofenac 75 MG EC tablet   Commonly known as: VOLTAREN   Take 75 mg by mouth 3 (three) times daily.      escitalopram 20 MG tablet   Commonly known as: LEXAPRO   Take 1 tablet (20 mg total) by mouth daily.      levothyroxine 137 MCG tablet   Commonly known as: SYNTHROID, LEVOTHROID   Take 1 tablet (137 mcg total) by mouth daily.      methadone 10 MG tablet   Commonly known as: DOLOPHINE   Take 10 mg by mouth every 8 (eight) hours.        MULTIVITAL PO   Take by mouth every morning.      omeprazole 40 MG capsule   Commonly known as: PRILOSEC   Take 1 capsule (40 mg total) by mouth daily.      oxyCODONE-acetaminophen 10-325 MG per tablet   Commonly known as: PERCOCET   Take 1 tablet by mouth every 6 (six) hours as needed.      promethazine 25 MG tablet   Commonly known as: PHENERGAN   Take 25 mg by mouth at bedtime as needed.      rizatriptan 10 MG tablet   Commonly known as: MAXALT   Take 10 mg by mouth as needed. May repeat in 2 hours if needed      topiramate 200  MG tablet   Commonly known as: TOPAMAX   Take 200 mg by mouth at bedtime.      triamterene-hydrochlorothiazide 75-50 MG per tablet   Commonly known as: MAXZIDE   Take 1 tablet by mouth daily. Hold until follow up with PCP; BP has been well controlled without this medication. And in fact your BP was low on admission, probably due to this drug.      Vitamin D (Ergocalciferol) 50000 UNITS Caps   Commonly known as: DRISDOL   Take 50,000 Units by mouth 2 (two) times a week.           Follow-up Information    Follow up with [name], MD in 2 weeks.   Contact information:   [address] [place] [address] [phone]           The results of significant diagnostics from this hospitalization (including imaging, microbiology, ancillary and laboratory) are listed below for reference.    Significant Diagnostic Studies: Dg Chest 2 View  07/30/2011  *RADIOLOGY REPORT*  Clinical Data: Mid chest pain radiates into the back between shoulder blades.  Shortness of breath.  Family history of heart disease.  CHEST - 2 VIEW  Comparison: 07/01/2006  Findings: Cardiomediastinal silhouette is within normal limits. The lungs are hyperinflated.  There are no focal consolidations or pleural effusions.  No evidence for pulmonary edema.  The patient has had prior lower cervical fusion.  The degenerative changes are seen in the  thoracic spine.  IMPRESSION: No evidence for acute cardiopulmonary abnormality.  Original Report Authenticated By: Patterson Hammersmith, M.D.    Microbiology: No results found for this or any previous visit (from the past 240 hour(s)).   Labs: Basic Metabolic Panel:  Lab 08/01/11 9147 07/31/11 1034 07/30/11 2300  NA 137 -- 135  K 4.0 -- 4.2  CL 105 -- 104  CO2 24 -- 23  GLUCOSE 81 -- 79  BUN 15 -- 29*  CREATININE 0.63 -- 0.72  CALCIUM 8.4 -- 8.5  MG -- 2.5 --  PHOS -- 4.3 --   Liver Function Tests:  Lab 07/31/11 1034  AST 77*  ALT 58*  ALKPHOS 77  BILITOT 0.4  PROT 6.1  ALBUMIN 3.1*   CBC:  Lab 08/01/11 0316 07/30/11 2300  WBC 3.6* 6.0  NEUTROABS -- --  HGB 10.5* 11.4*  HCT 32.7* 35.1*  MCV 96.2 94.1  PLT 162 183   Cardiac Enzymes:  Lab 08/01/11 0317 07/31/11 1900 07/31/11 1034 07/31/11 0455 07/31/11 0245  CKTOTAL 65 61 71 -- --  CKMB 2.4 1.6 1.6 -- --  CKMBINDEX -- -- -- -- --  TROPONINI <0.30 <0.30 <0.30 <0.30 <0.30   BNP: BNP (last 3 results)  Basename 07/31/11 0245  PROBNP 206.2*   CBG:  Lab 07/31/11 1156  GLUCAP 84    Time coordinating discharge: >30 minutes  Signed:  Elain Wixon [phone]  [facility] 08/02/2011, 11:37 AM

## 2011-08-03 ENCOUNTER — Ambulatory Visit: Payer: Medicare Other

## 2011-08-18 ENCOUNTER — Ambulatory Visit (HOSPITAL_BASED_OUTPATIENT_CLINIC_OR_DEPARTMENT_OTHER): Payer: Medicare Other

## 2011-08-18 ENCOUNTER — Other Ambulatory Visit: Payer: Self-pay | Admitting: *Deleted

## 2011-08-18 ENCOUNTER — Other Ambulatory Visit (HOSPITAL_BASED_OUTPATIENT_CLINIC_OR_DEPARTMENT_OTHER): Payer: Medicare Other | Admitting: Lab

## 2011-08-18 VITALS — BP 111/71 | HR 88 | Temp 97.0°F | Resp 20

## 2011-08-18 DIAGNOSIS — N039 Chronic nephritic syndrome with unspecified morphologic changes: Secondary | ICD-10-CM

## 2011-08-18 DIAGNOSIS — N189 Chronic kidney disease, unspecified: Secondary | ICD-10-CM

## 2011-08-18 DIAGNOSIS — D510 Vitamin B12 deficiency anemia due to intrinsic factor deficiency: Secondary | ICD-10-CM

## 2011-08-18 DIAGNOSIS — D631 Anemia in chronic kidney disease: Secondary | ICD-10-CM

## 2011-08-18 DIAGNOSIS — D509 Iron deficiency anemia, unspecified: Secondary | ICD-10-CM

## 2011-08-18 DIAGNOSIS — F419 Anxiety disorder, unspecified: Secondary | ICD-10-CM

## 2011-08-18 LAB — CBC WITH DIFFERENTIAL (CANCER CENTER ONLY)
BASO#: 0 10*3/uL (ref 0.0–0.2)
EOS%: 4.5 % (ref 0.0–7.0)
HCT: 40 % (ref 34.8–46.6)
HGB: 13.3 g/dL (ref 11.6–15.9)
MCH: 31.7 pg (ref 26.0–34.0)
MCHC: 33.3 g/dL (ref 32.0–36.0)
MONO%: 8.7 % (ref 0.0–13.0)
NEUT%: 63.6 % (ref 39.6–80.0)

## 2011-08-18 MED ORDER — CYANOCOBALAMIN 1000 MCG/ML IJ SOLN
1000.0000 ug | Freq: Once | INTRAMUSCULAR | Status: AC
  Administered 2011-08-18: 1000 ug via INTRAMUSCULAR

## 2011-08-18 MED ORDER — ALPRAZOLAM 1 MG PO TABS: 1.0000 mg | ORAL_TABLET | Freq: Three times a day (TID) | ORAL | Status: AC | PRN

## 2011-08-18 NOTE — Patient Instructions (Signed)
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
## 2011-09-19 ENCOUNTER — Other Ambulatory Visit: Payer: Self-pay | Admitting: Family Medicine

## 2011-09-19 ENCOUNTER — Observation Stay (HOSPITAL_COMMUNITY)
Admission: EM | Admit: 2011-09-19 | Discharge: 2011-09-22 | Disposition: A | Discharge status: Home / Self Care | Payer: Medicare Other | Attending: Internal Medicine | Admitting: Internal Medicine

## 2011-09-19 ENCOUNTER — Encounter (HOSPITAL_COMMUNITY): Payer: Self-pay | Admitting: Emergency Medicine

## 2011-09-19 ENCOUNTER — Other Ambulatory Visit (HOSPITAL_COMMUNITY)
Admission: RE | Admit: 2011-09-19 | Discharge: 2011-09-19 | Disposition: A | Discharge status: Home / Self Care | Payer: Medicare Other | Source: Ambulatory Visit | Attending: Family Medicine | Admitting: Family Medicine

## 2011-09-19 DIAGNOSIS — M797 Fibromyalgia: Secondary | ICD-10-CM | POA: Diagnosis present

## 2011-09-19 DIAGNOSIS — D510 Vitamin B12 deficiency anemia due to intrinsic factor deficiency: Secondary | ICD-10-CM

## 2011-09-19 DIAGNOSIS — Z79899 Other long term (current) drug therapy: Secondary | ICD-10-CM | POA: Insufficient documentation

## 2011-09-19 DIAGNOSIS — G894 Chronic pain syndrome: Secondary | ICD-10-CM | POA: Insufficient documentation

## 2011-09-19 DIAGNOSIS — R404 Transient alteration of awareness: Secondary | ICD-10-CM

## 2011-09-19 DIAGNOSIS — G43909 Migraine, unspecified, not intractable, without status migrainosus: Secondary | ICD-10-CM | POA: Insufficient documentation

## 2011-09-19 DIAGNOSIS — E039 Hypothyroidism, unspecified: Secondary | ICD-10-CM | POA: Insufficient documentation

## 2011-09-19 DIAGNOSIS — N039 Chronic nephritic syndrome with unspecified morphologic changes: Secondary | ICD-10-CM

## 2011-09-19 DIAGNOSIS — G4730 Sleep apnea, unspecified: Secondary | ICD-10-CM | POA: Insufficient documentation

## 2011-09-19 DIAGNOSIS — E162 Hypoglycemia, unspecified: Secondary | ICD-10-CM

## 2011-09-19 DIAGNOSIS — J310 Chronic rhinitis: Secondary | ICD-10-CM

## 2011-09-19 DIAGNOSIS — D649 Anemia, unspecified: Secondary | ICD-10-CM

## 2011-09-19 DIAGNOSIS — Z9884 Bariatric surgery status: Secondary | ICD-10-CM | POA: Insufficient documentation

## 2011-09-19 DIAGNOSIS — F419 Anxiety disorder, unspecified: Secondary | ICD-10-CM

## 2011-09-19 DIAGNOSIS — D509 Iron deficiency anemia, unspecified: Secondary | ICD-10-CM

## 2011-09-19 DIAGNOSIS — R945 Abnormal results of liver function studies: Secondary | ICD-10-CM

## 2011-09-19 DIAGNOSIS — E871 Hypo-osmolality and hyponatremia: Principal | ICD-10-CM | POA: Diagnosis present

## 2011-09-19 DIAGNOSIS — E1169 Type 2 diabetes mellitus with other specified complication: Secondary | ICD-10-CM | POA: Insufficient documentation

## 2011-09-19 DIAGNOSIS — M7989 Other specified soft tissue disorders: Secondary | ICD-10-CM

## 2011-09-19 DIAGNOSIS — R7989 Other specified abnormal findings of blood chemistry: Secondary | ICD-10-CM

## 2011-09-19 DIAGNOSIS — M129 Arthropathy, unspecified: Secondary | ICD-10-CM

## 2011-09-19 DIAGNOSIS — F418 Other specified anxiety disorders: Secondary | ICD-10-CM | POA: Diagnosis present

## 2011-09-19 DIAGNOSIS — M810 Age-related osteoporosis without current pathological fracture: Secondary | ICD-10-CM | POA: Insufficient documentation

## 2011-09-19 DIAGNOSIS — Z124 Encounter for screening for malignant neoplasm of cervix: Secondary | ICD-10-CM | POA: Insufficient documentation

## 2011-09-19 DIAGNOSIS — IMO0001 Reserved for inherently not codable concepts without codable children: Secondary | ICD-10-CM

## 2011-09-19 DIAGNOSIS — E669 Obesity, unspecified: Secondary | ICD-10-CM

## 2011-09-19 DIAGNOSIS — E538 Deficiency of other specified B group vitamins: Secondary | ICD-10-CM | POA: Insufficient documentation

## 2011-09-19 DIAGNOSIS — E119 Type 2 diabetes mellitus without complications: Secondary | ICD-10-CM

## 2011-09-19 DIAGNOSIS — F341 Dysthymic disorder: Secondary | ICD-10-CM | POA: Insufficient documentation

## 2011-09-19 DIAGNOSIS — N189 Chronic kidney disease, unspecified: Secondary | ICD-10-CM

## 2011-09-19 DIAGNOSIS — G4733 Obstructive sleep apnea (adult) (pediatric): Secondary | ICD-10-CM | POA: Diagnosis present

## 2011-09-19 LAB — COMPREHENSIVE METABOLIC PANEL
Albumin: 4 g/dL (ref 3.5–5.2)
Alkaline Phosphatase: 85 U/L (ref 39–117)
BUN: 24 mg/dL — ABNORMAL HIGH (ref 6–23)
Potassium: 3.7 mEq/L (ref 3.5–5.1)
Sodium: 125 mEq/L — ABNORMAL LOW (ref 135–145)
Total Protein: 7.4 g/dL (ref 6.0–8.3)

## 2011-09-19 LAB — CBC WITH DIFFERENTIAL/PLATELET
Basophils Relative: 1 % (ref 0–1)
Eosinophils Absolute: 0.1 10*3/uL (ref 0.0–0.7)
MCH: 31 pg (ref 26.0–34.0)
MCHC: 34.6 g/dL (ref 30.0–36.0)
Neutrophils Relative %: 59 % (ref 43–77)
Platelets: 240 10*3/uL (ref 150–400)

## 2011-09-19 MED ORDER — OXYCODONE-ACETAMINOPHEN 5-325 MG PO TABS
2.0000 | ORAL_TABLET | Freq: Once | ORAL | Status: AC
  Administered 2011-09-19: 2 via ORAL
  Filled 2011-09-19: qty 2

## 2011-09-19 NOTE — ED Notes (Signed)
Pt states she went to the dr for a physical and the dr called her tonight and told her that her sodium level was 137 and was told to come here

## 2011-09-19 NOTE — ED Provider Notes (Signed)
History     CSN: [id number]  Arrival date & time 09/19/11  1940   First MD Initiated Contact with Patient 09/19/11 2155      Chief Complaint  Patient presents with  . abnormal labs     (Consider location/radiation/quality/duration/timing/severity/associated sxs/prior treatment) HPI  Patient sent to the ED by her PCP Dr. Cliffton Asters for low sodium of unknown cause. The patient is on a lot of medications. She has had gastric bipass surgery and now has pernicious anemia secondarily. She laso has fibromyalgia and hypothyroidsim. She has had some mild dizziness. She denies having any symptoms of confusion, weakness, chills, chest pain. VSS/NAD. Pt alert and oriented x 3.  Past Medical History  Diagnosis Date  . Osteoporosis   . Hypothyroidism   . Diabetes mellitus   . Exogenous obesity   . Rhinitis   . Arthritis   . Fibromyalgia   . Somnolence   . Sleep apnea   . Anemia, iron deficiency 04/06/2011  . Pernicious anemia 04/06/2011    Past Surgical History  Procedure Date  . Gastric bypass   . Cholecystectomy   . Tracheostomy   . Uvulectomy   . Tonsillectomy   . Mandible reconstruction   . Spinal fusion   . Dilation and curettage of uterus   . Cesarean section     History reviewed. No pertinent family history.  History  Substance Use Topics  . Smoking status: Never Smoker   . Smokeless tobacco: Not on file  . Alcohol Use: No    OB History    Grav Para Term Preterm Abortions TAB SAB Ect Mult Living                  Review of Systems   Review of Systems  Gen: no weight loss, fevers, chills, night sweats  Eyes: no discharge or drainage, no occular pain or visual changes  Nose: no epistaxis or rhinorrhea  Mouth: no dental pain, no sore throat  Neck: no neck pain  Lungs:No wheezing, coughing or hemoptysis CV: no chest pain, palpitations, dependent edema or orthopnea  Abd: no abdominal pain, nausea, vomiting  GU: no dysuria or gross hematuria  MSK:  Chronic back  pain  Neuro: no headache, no focal neurologic deficits  Skin: no abnormalities Psyche: negative.    Allergies  Iodine; Iron; Sulfonamide derivatives; Benazepril hcl; Codeine; Penicillins; and Amitriptyline  Home Medications   Current Outpatient Rx  Name Route Sig Dispense Refill  . ALPRAZOLAM 1 MG PO TABS Oral Take 1 mg by mouth 3 (three) times daily as needed. anxiety     . BIOTIN 5000 MCG PO CAPS Oral Take 5,000 mcg by mouth 2 (two) times daily.    Marland Kitchen CALCIUM + D PO Oral Take by mouth every morning. Has magnesium and zinc in it also.    Marland Kitchen DICLOFENAC EPOLAMINE 1.3 % TD PTCH Transdermal Place 1 patch onto the skin daily.    Marland Kitchen DICLOFENAC SODIUM 75 MG PO TBEC Oral Take 75 mg by mouth 2 (two) times daily.     Marland Kitchen ESCITALOPRAM OXALATE 20 MG PO TABS Oral Take 1 tablet (20 mg total) by mouth daily. 30 tablet 0  . LEVOTHYROXINE SODIUM 137 MCG PO TABS Oral Take 1 tablet (137 mcg total) by mouth daily.    Marland Kitchen METHADONE HCL 10 MG PO TABS Oral Take 10 mg by mouth 4 (four) times daily.     . MULTIVITAL PO Oral Take by mouth every morning.    Marland Kitchen  OMEPRAZOLE 40 MG PO CPDR Oral Take 1 capsule (40 mg total) by mouth daily. 30 capsule 1  . OXYCODONE-ACETAMINOPHEN 10-325 MG PO TABS Oral Take 1 tablet by mouth every 6 (six) hours as needed. pain    . PROMETHAZINE HCL 25 MG PO TABS Oral Take 25 mg by mouth at bedtime as needed. nausea    . TIZANIDINE HCL 4 MG PO TABS Oral Take 4 mg by mouth every 6 (six) hours as needed. Muscle spasms    . TOPIRAMATE 200 MG PO TABS Oral Take 200 mg by mouth at bedtime.      . TRIAMTERENE-HCTZ 75-50 MG PO TABS Oral Take 1 tablet by mouth daily as needed. Taking for swelling    . VITAMIN D (ERGOCALCIFEROL) 50000 UNITS PO CAPS Oral Take 50,000 Units by mouth 2 (two) times a week. Mondays and thursdays      BP 112/60  Pulse 77  Temp 98 F (36.7 C) (Oral)  Resp 16  SpO2 100%  Physical Exam  Nursing note and vitals reviewed. Constitutional: She is oriented to person, place,  and time. She appears well-developed and well-nourished. No distress.       Pt looks older than her age  HENT:  Head: Normocephalic and atraumatic.  Eyes: Pupils are equal, round, and reactive to light.  Neck: Normal range of motion. Neck supple.  Cardiovascular: Normal rate and regular rhythm.   Pulmonary/Chest: Effort normal.  Abdominal: Soft. She exhibits no distension. There is no tenderness.  Neurological: She is oriented to person, place, and time.  Skin: Skin is warm and dry.    ED Course  Procedures (including critical care time)  Labs Reviewed  COMPREHENSIVE METABOLIC PANEL - Abnormal; Notable for the following:    Sodium 125 (*)     Chloride 91 (*)     Glucose, Bld 48 (*)     BUN 24 (*)     AST 48 (*)     ALT 45 (*)     Total Bilirubin 0.2 (*)     GFR calc non Af Amer 74 (*)     GFR calc Af Amer 86 (*)     All other components within normal limits  CBC WITH DIFFERENTIAL  CORTISOL   No results found.   1. Hyponatremia   2. Hypoglycemia       MDM  Patient admitted by medicine for hyponatremia and hypoglycemia. Unable to find cause for either. Dr. Lavera Guise has agreed to place patient on obs for both. [facility], TEAM 8, med-surg        Dorthula Matas, [place] 09/19/11 2348

## 2011-09-19 NOTE — H&P (Signed)
[facility] History and Physical  Valerie Silva ZOX:[id number] DOB: 1960-02-26 DOA: 09/19/2011   PCP: [name], MD   Chief Complaint:  Chief Complaint  Patient presents with  . abnormal labs      HPI: Valerie Silva is a 51 y.o. female with history of chronic pain, migraines, whole went to her primary care physician for a yearly physical today. The lab results returned and the patient was found to have hyponatremia and hypoglycemia referred to the emergency room. According to the patient she does not feel necessarily worse than on any given day. She's been on disability for years and on chronic pain medications for years so she is used to feeling kind of dizzy and weak. There have been no recent changes in her medications   Review of Systems: The patient denies anorexia, fever, weight loss,, vision loss, decreased hearing, hoarseness, chest pain, syncope, dyspnea on exertion, peripheral edema, balance deficits, hemoptysis, abdominal pain, melena, hematochezia, severe indigestion/heartburn, hematuria, incontinence, genital sores, muscle weakness, suspicious skin lesions, transient blindness, difficulty walking, depression, unusual weight change, abnormal bleeding, enlarged lymph nodes, angioedema, and breast masses.  Patient complains of chronic headaches, chronic back pain  Past Medical History  Diagnosis Date  . Osteoporosis   . Hypothyroidism   . Diabetes mellitus   . Exogenous obesity   . Rhinitis   . Arthritis   . Fibromyalgia   . Somnolence   . Sleep apnea   . Anemia, iron deficiency 04/06/2011  . Pernicious anemia 04/06/2011   Past Surgical History  Procedure Date  . Gastric bypass   . Cholecystectomy   . Tracheostomy   . Uvulectomy   . Tonsillectomy   . Mandible reconstruction   . Spinal fusion   . Dilation and curettage of uterus   . Cesarean section    Social History:  reports that she has never smoked. She does not have any smokeless tobacco history  on file. She reports that she does not drink alcohol. Her drug history not on file. The patient lives at home with her son and is independent with her activities of daily living  Allergies  Allergen Reactions  . Iodine Other (See Comments)    Sensitive when used gynecologically (intra-vaginally)  . Iron Itching    Iron taken by mouth causes this reaction the patient has had several iron infusions plus Feraheme that has not caused any side effects  . Sulfonamide Derivatives Other (See Comments)    Sensitive when used intra-vaginally.  . Benazepril Hcl     REACTION: severe cough  . Codeine     REACTION: AMS, feeling of passing out  . Penicillins     REACTION: severe family allergy.  Family physician long ago stated she shouldn't take this med since mother and brother nearly died from anaphylaxis.  . Amitriptyline     Sleep walking    Family history is positive for htn  Prior to Admission medications   Medication Sig Start Date End Date Taking? Authorizing Provider  ALPRAZolam Prudy Feeler) 1 MG tablet Take 1 mg by mouth 3 (three) times daily as needed. anxiety  07/19/11  Yes Josph Macho, MD  Biotin 5000 MCG CAPS Take 5,000 mcg by mouth 2 (two) times daily.   Yes Historical Provider, MD  Calcium Carbonate-Vitamin D (CALCIUM + D PO) Take by mouth every morning. Has magnesium and zinc in it also.   Yes Historical Provider, MD  diclofenac (FLECTOR) 1.3 % PTCH Place 1 patch onto the skin  daily.   Yes Historical Provider, MD  diclofenac (VOLTAREN) 75 MG EC tablet Take 75 mg by mouth 2 (two) times daily.    Yes Historical Provider, MD  escitalopram (LEXAPRO) 20 MG tablet Take 1 tablet (20 mg total) by mouth daily. 08/02/11  Yes Vassie Loll, MD  levothyroxine (SYNTHROID, LEVOTHROID) 137 MCG tablet Take 1 tablet (137 mcg total) by mouth daily. 08/02/11  Yes Vassie Loll, MD  methadone (DOLOPHINE) 10 MG tablet Take 10 mg by mouth 4 (four) times daily.    Yes Historical Provider, MD  Multiple  Vitamins-Minerals (MULTIVITAL PO) Take by mouth every morning.   Yes Historical Provider, MD  omeprazole (PRILOSEC) 40 MG capsule Take 1 capsule (40 mg total) by mouth daily. 08/02/11 08/01/12 Yes Vassie Loll, MD  oxyCODONE-acetaminophen (PERCOCET) 10-325 MG per tablet Take 1 tablet by mouth every 6 (six) hours as needed. pain   Yes Historical Provider, MD  promethazine (PHENERGAN) 25 MG tablet Take 25 mg by mouth at bedtime as needed. nausea   Yes Historical Provider, MD  tiZANidine (ZANAFLEX) 4 MG tablet Take 4 mg by mouth every 6 (six) hours as needed. Muscle spasms   Yes Historical Provider, MD  topiramate (TOPAMAX) 200 MG tablet Take 200 mg by mouth at bedtime.     Yes Historical Provider, MD  triamterene-hydrochlorothiazide (MAXZIDE) 75-50 MG per tablet Take 1 tablet by mouth daily as needed. Taking for swelling 08/02/11  Yes Vassie Loll, MD  Vitamin D, Ergocalciferol, (DRISDOL) 50000 UNITS CAPS Take 50,000 Units by mouth 2 (two) times a week. Mondays and thursdays   Yes Historical Provider, MD   Physical Exam: Filed Vitals:   09/19/11 1952 09/20/11 0138 09/20/11 0203  BP: 112/60 107/65 102/62  Pulse: 77 75 70  Temp: 98 F (36.7 C) 98 F (36.7 C) 97.5 F (36.4 C)  TempSrc: Oral Oral Oral  Resp: 16 16 16   Height:   5\' 8"  (1.727 m)  Weight:   86.183 kg (190 lb)  SpO2: 100% 98% 99%     General:  Alert and oriented x3, head normocephalic atraumatic  Eyes: Pupil equal round react to light and accommodation, extraocular movements intact  ENT: No pharyngeal erythema  Neck: No jugular venous distention  Cardiovascular: Regular rate and rhythm without murmurs rubs or gallops  Respiratory: Clear to auscultation bilaterally  Abdomen: Soft nontender, bowel sounds are present  Skin: Pale dry without rashes  Musculoskeletal: Intact  Psychiatric: Euthymic  Neurologic: Cranial nerves 2-12 intact, strength 5/5 all 4 extremities, sensation intact  Labs on Admission:  Basic  Metabolic Panel:  Lab 09/19/11 1610  NA 125*  K 3.7  CL 91*  CO2 26  GLUCOSE 48*  BUN 24*  CREATININE 0.89  CALCIUM 8.8  MG --  PHOS --   Liver Function Tests:  Lab 09/19/11 2220  AST 48*  ALT 45*  ALKPHOS 85  BILITOT 0.2*  PROT 7.4  ALBUMIN 4.0   No results found for this basename: LIPASE:5,AMYLASE:5 in the last 168 hours No results found for this basename: AMMONIA:5 in the last 168 hours CBC:  Lab 09/19/11 2220  WBC 5.7  NEUTROABS 3.4  HGB 12.8  HCT 37.0  MCV 89.6  PLT 240   Cardiac Enzymes: No results found for this basename: CKTOTAL:5,CKMB:5,CKMBINDEX:5,TROPONINI:5 in the last 168 hours  BNP (last 3 results)  Basename 07/31/11 0245  PROBNP 206.2*   CBG: No results found for this basename: GLUCAP:5 in the last 168 hours  Radiological Exams on Admission:  No results found.  EKG: Independently reviewed.   Assessment/Plan Principal Problem:  *Hypoglycemia Active Problems:  HYPOTHYROIDISM  FIBROMYALGIA  SLEEP APNEA  Vitamin B 12 deficiency  Depression with anxiety  Chronic anemia  Hyponatremia  Abnormal LFTs   1. Severe hypoglycemia-of unclear cause-patient reports that she's been having the problems for years. There are no new medications. Patient has mild abnormal LFTs but she does not have liver failure. I'm going to go ahead and order a stat cortisol level to rule out renal sufficiency. 2. Hyponatremia-new problem. Suspect related to HCTZ. We'll discontinue that medication. We'll give IV fluids and followup bmp tomorrow 3. Hypothyroidism-adequately replaced as per labs from July. Continue Synthroid at current dose 4. Chronic pain syndrome and fibromyalgia-continue methadone at current dose  Code Status: Full code Family Communication: Son Disposition Plan: Home  Time spent: 30 minutes  Shonda Mandarino [facility] Pager [phone]  If 7PM-7AM, please contact night-coverage [email or web address] Password [email or web address] 09/20/2011, 2:04 AM

## 2011-09-19 NOTE — ED Notes (Signed)
Pt states she may be mistaken on the sodium level as she was asleep when the dr called her this evening

## 2011-09-20 ENCOUNTER — Observation Stay (HOSPITAL_COMMUNITY): Payer: Medicare Other

## 2011-09-20 DIAGNOSIS — R7989 Other specified abnormal findings of blood chemistry: Secondary | ICD-10-CM | POA: Diagnosis present

## 2011-09-20 DIAGNOSIS — E162 Hypoglycemia, unspecified: Secondary | ICD-10-CM | POA: Diagnosis present

## 2011-09-20 DIAGNOSIS — E871 Hypo-osmolality and hyponatremia: Secondary | ICD-10-CM | POA: Diagnosis present

## 2011-09-20 DIAGNOSIS — R945 Abnormal results of liver function studies: Secondary | ICD-10-CM | POA: Diagnosis present

## 2011-09-20 LAB — GLUCOSE, CAPILLARY
Glucose-Capillary: 80 mg/dL (ref 70–99)
Glucose-Capillary: 122 mg/dL — ABNORMAL HIGH (ref 70–99)
Glucose-Capillary: 129 mg/dL — ABNORMAL HIGH (ref 70–99)

## 2011-09-20 LAB — CBC
HCT: 34.1 % — ABNORMAL LOW (ref 36.0–46.0)
Hemoglobin: 11.7 g/dL — ABNORMAL LOW (ref 12.0–15.0)
MCH: 30.7 pg (ref 26.0–34.0)
MCHC: 34.3 g/dL (ref 30.0–36.0)
MCV: 89.5 fL (ref 78.0–100.0)
RBC: 3.81 MIL/uL — ABNORMAL LOW (ref 3.87–5.11)

## 2011-09-20 LAB — HEPATITIS PANEL, ACUTE
HCV Ab: NEGATIVE
Hep A IgM: NEGATIVE
Hep B C IgM: NEGATIVE
Hepatitis B Surface Ag: NEGATIVE

## 2011-09-20 LAB — BASIC METABOLIC PANEL
BUN: 23 mg/dL (ref 6–23)
CO2: 28 mEq/L (ref 19–32)
Chloride: 91 mEq/L — ABNORMAL LOW (ref 96–112)
GFR calc non Af Amer: 79 mL/min — ABNORMAL LOW (ref >90)
Glucose, Bld: 81 mg/dL (ref 70–99)
Potassium: 3.8 mEq/L (ref 3.5–5.1)

## 2011-09-20 LAB — HEMOGLOBIN A1C
Hgb A1c MFr Bld: 5 % (ref <5.7)
Mean Plasma Glucose: 97 mg/dL (ref <117)

## 2011-09-20 LAB — TSH: TSH: 20.024 u[IU]/mL — ABNORMAL HIGH (ref 0.350–4.500)

## 2011-09-20 LAB — C-PEPTIDE: C-Peptide: 2.13 ng/mL (ref 0.80–3.90)

## 2011-09-20 LAB — INSULIN, RANDOM: Insulin: 9 u[IU]/mL (ref 3–28)

## 2011-09-20 MED ORDER — VITAMIN D (ERGOCALCIFEROL) 1.25 MG (50000 UNIT) PO CAPS
50000.0000 [IU] | ORAL_CAPSULE | ORAL | Status: DC
  Administered 2011-09-22: 50000 [IU] via ORAL
  Filled 2011-09-20 (×2): qty 1

## 2011-09-20 MED ORDER — SUMATRIPTAN SUCCINATE 100 MG PO TABS
100.0000 mg | ORAL_TABLET | Freq: Once | ORAL | Status: AC
  Administered 2011-09-20: 100 mg via ORAL
  Filled 2011-09-20: qty 1

## 2011-09-20 MED ORDER — GADOBENATE DIMEGLUMINE 529 MG/ML IV SOLN
18.0000 mL | Freq: Once | INTRAVENOUS | Status: AC | PRN
  Administered 2011-09-20: 18 mL via INTRAVENOUS

## 2011-09-20 MED ORDER — ESCITALOPRAM OXALATE 20 MG PO TABS
20.0000 mg | ORAL_TABLET | Freq: Every day | ORAL | Status: DC
  Filled 2011-09-20: qty 1

## 2011-09-20 MED ORDER — TOPIRAMATE 100 MG PO TABS
200.0000 mg | ORAL_TABLET | Freq: Every day | ORAL | Status: DC
  Administered 2011-09-20 – 2011-09-21 (×2): 200 mg via ORAL
  Filled 2011-09-20 (×3): qty 2

## 2011-09-20 MED ORDER — HYDROCORTISONE 10 MG PO TABS
10.0000 mg | ORAL_TABLET | Freq: Two times a day (BID) | ORAL | Status: DC
  Administered 2011-09-20 – 2011-09-21 (×3): 10 mg via ORAL
  Filled 2011-09-20 (×4): qty 1

## 2011-09-20 MED ORDER — LEVOTHYROXINE SODIUM 137 MCG PO TABS
137.0000 ug | ORAL_TABLET | Freq: Every day | ORAL | Status: DC
  Administered 2011-09-20 – 2011-09-21 (×2): 137 ug via ORAL
  Filled 2011-09-20 (×2): qty 1

## 2011-09-20 MED ORDER — SODIUM CHLORIDE 0.9 % IV SOLN
INTRAVENOUS | Status: DC
  Administered 2011-09-20: 75 mL/h via INTRAVENOUS
  Administered 2011-09-20: 03:00:00 via INTRAVENOUS
  Administered 2011-09-21: 1000 mL via INTRAVENOUS

## 2011-09-20 MED ORDER — ALPRAZOLAM 1 MG PO TABS
1.0000 mg | ORAL_TABLET | Freq: Three times a day (TID) | ORAL | Status: DC | PRN
  Administered 2011-09-20 – 2011-09-21 (×3): 1 mg via ORAL
  Filled 2011-09-20 (×3): qty 1

## 2011-09-20 MED ORDER — OXYCODONE-ACETAMINOPHEN 5-325 MG PO TABS
1.0000 | ORAL_TABLET | Freq: Four times a day (QID) | ORAL | Status: DC | PRN
  Administered 2011-09-20: 1 via ORAL
  Filled 2011-09-20: qty 1

## 2011-09-20 MED ORDER — METHADONE HCL 10 MG PO TABS
10.0000 mg | ORAL_TABLET | Freq: Once | ORAL | Status: AC
  Administered 2011-09-20: 10 mg via ORAL
  Filled 2011-09-20: qty 1

## 2011-09-20 MED ORDER — PROMETHAZINE HCL 25 MG PO TABS: 25.0000 mg | ORAL_TABLET | Freq: Every evening | ORAL | Status: DC | PRN

## 2011-09-20 MED ORDER — ASPIRIN EC 81 MG PO TBEC
81.0000 mg | DELAYED_RELEASE_TABLET | Freq: Every day | ORAL | Status: DC
  Administered 2011-09-20 – 2011-09-21 (×2): 81 mg via ORAL
  Filled 2011-09-20 (×2): qty 1

## 2011-09-20 MED ORDER — OXYCODONE-ACETAMINOPHEN 10-325 MG PO TABS: 1.0000 | ORAL_TABLET | Freq: Four times a day (QID) | ORAL | Status: DC | PRN

## 2011-09-20 MED ORDER — OXYCODONE HCL 5 MG PO TABS
5.0000 mg | ORAL_TABLET | Freq: Four times a day (QID) | ORAL | Status: DC | PRN
  Administered 2011-09-20: 5 mg via ORAL
  Filled 2011-09-20: qty 1

## 2011-09-20 MED ORDER — DICLOFENAC EPOLAMINE 1.3 % TD PTCH
1.0000 | MEDICATED_PATCH | Freq: Every day | TRANSDERMAL | Status: DC
  Administered 2011-09-20 – 2011-09-21 (×2): 1 via TRANSDERMAL
  Filled 2011-09-20 (×4): qty 1

## 2011-09-20 MED ORDER — METHADONE HCL 10 MG PO TABS
10.0000 mg | ORAL_TABLET | Freq: Four times a day (QID) | ORAL | Status: DC
  Administered 2011-09-20 – 2011-09-22 (×11): 10 mg via ORAL
  Filled 2011-09-20 (×11): qty 1

## 2011-09-20 MED ORDER — ENOXAPARIN SODIUM 40 MG/0.4ML ~~LOC~~ SOLN
40.0000 mg | SUBCUTANEOUS | Status: DC
  Filled 2011-09-20 (×2): qty 0.4

## 2011-09-20 MED ORDER — PANTOPRAZOLE SODIUM 40 MG PO TBEC
40.0000 mg | DELAYED_RELEASE_TABLET | Freq: Every day | ORAL | Status: DC
  Administered 2011-09-20 – 2011-09-22 (×3): 40 mg via ORAL
  Filled 2011-09-20 (×3): qty 1

## 2011-09-20 MED ORDER — TIZANIDINE HCL 4 MG PO TABS
4.0000 mg | ORAL_TABLET | Freq: Four times a day (QID) | ORAL | Status: DC | PRN
  Administered 2011-09-21: 4 mg via ORAL
  Filled 2011-09-20: qty 1

## 2011-09-20 MED ORDER — OXYCODONE-ACETAMINOPHEN 5-325 MG PO TABS
2.0000 | ORAL_TABLET | Freq: Four times a day (QID) | ORAL | Status: DC | PRN
  Administered 2011-09-20 (×2): 1 via ORAL
  Administered 2011-09-21 – 2011-09-22 (×6): 2 via ORAL
  Filled 2011-09-20: qty 2
  Filled 2011-09-20 (×2): qty 1
  Filled 2011-09-20 (×5): qty 2

## 2011-09-20 MED ORDER — OXYCODONE HCL 5 MG PO TABS
5.0000 mg | ORAL_TABLET | Freq: Four times a day (QID) | ORAL | Status: DC | PRN
  Administered 2011-09-20 – 2011-09-22 (×8): 5 mg via ORAL
  Filled 2011-09-20 (×8): qty 1

## 2011-09-20 MED ORDER — DICLOFENAC SODIUM 75 MG PO TBEC
75.0000 mg | DELAYED_RELEASE_TABLET | Freq: Two times a day (BID) | ORAL | Status: DC
  Administered 2011-09-20 – 2011-09-22 (×6): 75 mg via ORAL
  Filled 2011-09-20 (×7): qty 1

## 2011-09-20 NOTE — Progress Notes (Signed)
[facility] PROGRESS NOTE  Valerie Silva GNF:[id number] DOB: [birth year] DOA: 09/19/2011 PCP: [name], MD  Assessment/Plan: Principal Problem:  *Hypoglycemia Active Problems:  HYPOTHYROIDISM  FIBROMYALGIA  SLEEP APNEA  Vitamin B 12 deficiency  Depression with anxiety  Chronic anemia  Hyponatremia  Abnormal LFTs   1. Severe hypoglycemia-of unclear cause-patient reports that she's been having the problems for years. There are no new medications. Patient has mild abnormal LFTs but she does not have liver failure. I'm going to go ahead and order a stat cortisol level to rule out renal sufficiency. Random cortisol level of 1.8. Suspect adrenal insufficiency needed start hydrocortisone replacement 2. Hyponatremia-, check cortisol, TSH, new problem. Suspect related to HCTZ. We'll discontinue that medication. We'll give IV fluids and followup bmp tomorrow 3. Hypothyroidism-adequately replaced as per labs from July. Continue Synthroid at current dose 4. Chronic pain syndrome and fibromyalgia-continue methadone at current dose 5. Chronic headaches, MRI of the brain done however no significant pathology found 6.    Objective: Filed Vitals:   09/19/11 1952 09/20/11 0138 09/20/11 0203 09/20/11 0608  BP: 112/60 107/65 102/62 104/56  Pulse: 77 75 70 61  Temp: 98 F (36.7 C) 98 F (36.7 C) 97.5 F (36.4 C) 97.5 F (36.4 C)  TempSrc: Oral Oral Oral Oral  Resp: 16 16 16 17   Height:   5\' 8"  (1.727 m)   Weight:   86.183 kg (190 lb)   SpO2: 100% 98% 99% 99%    Intake/Output Summary (Last 24 hours) at 09/20/11 1219 Last data filed at 09/20/11 8469  Gross per 24 hour  Intake    310 ml  Output      0 ml  Net    310 ml    Exam:  HENT:  Head: Atraumatic.  Nose: Nose normal.  Mouth/Throat: Oropharynx is clear and moist.  Eyes: Conjunctivae are normal. Pupils are equal, round, and reactive to light. No scleral icterus.  Neck: Neck supple. No tracheal deviation present.    Cardiovascular: Normal rate, regular rhythm, normal heart sounds and intact distal pulses.  Pulmonary/Chest: Effort normal and breath sounds normal. No respiratory distress.  Abdominal: Soft. Normal appearance and bowel sounds are normal. She exhibits no distension. There is no tenderness.  Musculoskeletal: She exhibits no edema and no tenderness.  Neurological: She is alert. No cranial nerve deficit.    Data Reviewed: Basic Metabolic Panel:  Lab 09/20/11 6295 09/19/11 2220  NA 128* 125*  K 3.8 3.7  CL 91* 91*  CO2 28 26  GLUCOSE 81 48*  BUN 23 24*  CREATININE 0.84 0.89  CALCIUM 8.5 8.8  MG -- --  PHOS -- --    Liver Function Tests:  Lab 09/19/11 2220  AST 48*  ALT 45*  ALKPHOS 85  BILITOT 0.2*  PROT 7.4  ALBUMIN 4.0   No results found for this basename: LIPASE:5,AMYLASE:5 in the last 168 hours No results found for this basename: AMMONIA:5 in the last 168 hours  CBC:  Lab 09/20/11 0343 09/19/11 2220  WBC 5.3 5.7  NEUTROABS -- 3.4  HGB 11.7* 12.8  HCT 34.1* 37.0  MCV 89.5 89.6  PLT 201 240    Cardiac Enzymes: No results found for this basename: CKTOTAL:5,CKMB:5,CKMBINDEX:5,TROPONINI:5 in the last 168 hours BNP (last 3 results)  Basename 07/31/11 0245  PROBNP 206.2*     CBG:  Lab 09/20/11 0949 09/20/11 0607 09/20/11 0320 09/20/11 0257  GLUCAP 80 86 83 64*    No results found for this  or any previous visit (from the past 240 hour(s)).   Studies: Mr Laqueta Jean ZO Contrast  Sep 22, 2011  *RADIOLOGY REPORT*  Clinical Data: Chronic headache, migraines.  MRI HEAD WITHOUT AND WITH CONTRAST  Technique:  Multiplanar, multiecho pulse sequences of the brain and surrounding structures were obtained according to standard protocol without and with intravenous contrast  Contrast: 18mL MULTIHANCE GADOBENATE DIMEGLUMINE 529 MG/ML IV SOLN  Comparison: None.  Findings:  There is no evidence for acute infarction, intracranial hemorrhage, mass lesion, hydrocephalus, or  extra-axial fluid. There may be slight premature atrophy.  There is no significant chronic microvascular ischemic change.  Normal pituitary and cerebellar tonsils.  Intact calvarium.  Extracranial soft tissues unremarkable including the orbits, paranasal sinus, and mastoids.  Post infusion, no abnormal enhancement of the brain or meninges. Major intracranial vascular structures widely patent.  IMPRESSION: Slight atrophy.  No acute or focal intracranial abnormality.  No abnormal post contrast enhancement.   Original Report Authenticated By: Elsie Stain, M.D.     Scheduled Meds:   . aspirin EC  81 mg Oral Daily  . diclofenac  1 patch Transdermal Daily  . diclofenac  75 mg Oral BID  . enoxaparin (LOVENOX) injection  40 mg Subcutaneous Q24H  . levothyroxine  137 mcg Oral Daily  . methadone  10 mg Oral QID  . methadone  10 mg Oral Once  . oxyCODONE-acetaminophen  2 tablet Oral Once  . pantoprazole  40 mg Oral Q1200  . SUMAtriptan  100 mg Oral Once  . topiramate  200 mg Oral QHS  . Vitamin D (Ergocalciferol)  50,000 Units Oral 2 times weekly  . DISCONTD: escitalopram  20 mg Oral Daily   Continuous Infusions:   . sodium chloride 75 mL/hr at 2011-09-22 0308    Principal Problem:  *Hypoglycemia Active Problems:  HYPOTHYROIDISM  FIBROMYALGIA  SLEEP APNEA  Vitamin B 12 deficiency  Depression with anxiety  Chronic anemia  Hyponatremia  Abnormal LFTs    Time spent: 40 minutes   [facility]  [facility] Pager [phone]. If 8PM-8AM, please contact night-coverage at [email or web address], password [facility] Dt 09-22-2011, 12:19 PM  LOS: 1 day

## 2011-09-20 NOTE — Progress Notes (Signed)
Pt c/o migraine and arthritis pain from missing doses of prescribed medications.  Notified MD.  Received new orders.  Will continue to monitor pt. Valerie Silva 2:40 AM 09/20/2011

## 2011-09-20 NOTE — Progress Notes (Signed)
Order that admitting MD be notify when pt arrives on floor.  Notified MD.  Will continue to monitor pt. Valerie Silva 2:33 AM 09/20/2011

## 2011-09-20 NOTE — Progress Notes (Signed)
UR done. 

## 2011-09-20 NOTE — Progress Notes (Signed)
Pt has low CBG 48 in the ED and 64 on the floor.  Notified MD.  Received new orders.  Will continue to monitor the pt. Daphene Calamity Arkabutla  3:13 AM 09/20/2011

## 2011-09-21 LAB — GLUCOSE, CAPILLARY
Glucose-Capillary: 109 mg/dL — ABNORMAL HIGH (ref 70–99)
Glucose-Capillary: 71 mg/dL (ref 70–99)

## 2011-09-21 LAB — BASIC METABOLIC PANEL
CO2: 29 mEq/L (ref 19–32)
Calcium: 8.1 mg/dL — ABNORMAL LOW (ref 8.4–10.5)
Chloride: 98 mEq/L (ref 96–112)
Creatinine, Ser: 0.84 mg/dL (ref 0.50–1.10)
Glucose, Bld: 117 mg/dL — ABNORMAL HIGH (ref 70–99)

## 2011-09-21 MED ORDER — SODIUM CHLORIDE 0.9 % IV SOLN: 25.0000 mg | Freq: Once | INTRAVENOUS | Status: DC

## 2011-09-21 MED ORDER — LEVOTHYROXINE SODIUM 150 MCG PO TABS: 150.0000 ug | ORAL_TABLET | Freq: Every day | ORAL | Status: DC

## 2011-09-21 MED ORDER — HYDROCORTISONE 20 MG PO TABS
20.0000 mg | ORAL_TABLET | Freq: Every day | ORAL | Status: DC
  Administered 2011-09-22: 20 mg via ORAL
  Filled 2011-09-21: qty 1

## 2011-09-21 MED ORDER — DICLOFENAC EPOLAMINE 1.3 % TD PTCH
2.0000 | MEDICATED_PATCH | Freq: Every day | TRANSDERMAL | Status: DC
  Administered 2011-09-22: 2 via TRANSDERMAL
  Filled 2011-09-21 (×2): qty 2

## 2011-09-21 MED ORDER — HYDROCORTISONE 20 MG PO TABS
20.0000 mg | ORAL_TABLET | Freq: Every day | ORAL | Status: DC
  Filled 2011-09-21: qty 1

## 2011-09-21 MED ORDER — HYDROCORTISONE 10 MG PO TABS
10.0000 mg | ORAL_TABLET | Freq: Once | ORAL | Status: AC
  Administered 2011-09-21: 10 mg via ORAL
  Filled 2011-09-21: qty 1

## 2011-09-21 MED ORDER — HYDROCORTISONE 10 MG PO TABS
10.0000 mg | ORAL_TABLET | Freq: Every day | ORAL | Status: DC
  Administered 2011-09-21: 10 mg via ORAL
  Filled 2011-09-21 (×2): qty 1

## 2011-09-21 MED ORDER — HYDROCORTISONE 10 MG PO TABS: 10.0000 mg | ORAL_TABLET | Freq: Two times a day (BID) | ORAL | Status: DC

## 2011-09-21 MED ORDER — LEVOTHYROXINE SODIUM 50 MCG PO TABS
50.0000 ug | ORAL_TABLET | Freq: Every day | ORAL | Status: DC
  Filled 2011-09-21: qty 1

## 2011-09-21 MED ORDER — HYDROCORTISONE 20 MG PO TABS: 20.0000 mg | ORAL_TABLET | Freq: Every day | ORAL | Status: DC

## 2011-09-21 MED ORDER — SIMETHICONE 80 MG PO CHEW
160.0000 mg | CHEWABLE_TABLET | Freq: Four times a day (QID) | ORAL | Status: DC | PRN
  Administered 2011-09-21: 160 mg via ORAL
  Filled 2011-09-21: qty 2

## 2011-09-21 MED ORDER — CYANOCOBALAMIN 1000 MCG/ML IJ SOLN
1000.0000 ug | Freq: Once | INTRAMUSCULAR | Status: AC
  Administered 2011-09-21: 1000 ug via INTRAMUSCULAR
  Filled 2011-09-21: qty 1

## 2011-09-21 MED ORDER — HYDROCORTISONE 10 MG PO TABS
10.0000 mg | ORAL_TABLET | Freq: Every day | ORAL | Status: DC
  Filled 2011-09-21: qty 1

## 2011-09-21 MED ORDER — LEVOTHYROXINE SODIUM 150 MCG PO TABS
150.0000 ug | ORAL_TABLET | Freq: Every day | ORAL | Status: DC
  Administered 2011-09-22: 150 ug via ORAL
  Filled 2011-09-21 (×2): qty 1

## 2011-09-21 NOTE — Progress Notes (Signed)
Will hold DC as patient had a cbg of 58 last night , Will increase hydrocortisone to 20 mg in am and 10 mg in PM

## 2011-09-21 NOTE — Discharge Summary (Addendum)
Physician Discharge Summary  Valerie Silva MRN: [id number] DOB/AGE: 51-Feb-1962 51 y.o.  PCP: [name], MD   Admit date: 09/19/2011 Discharge date: 09/21/2011  Discharge Diagnoses:     *Hypoglycemia HYPOTHYROIDISM  FIBROMYALGIA  SLEEP APNEA  Vitamin B 12 deficiency  Depression with anxiety  Chronic anemia  Hyponatremia  Abnormal LFTs     Medication List     As of 09/21/2011  9:52 AM    STOP taking these medications         aspirin 81 MG EC tablet      triamterene-hydrochlorothiazide 75-50 MG per tablet   Commonly known as: MAXZIDE      TAKE these medications         ALPRAZolam 1 MG tablet   Commonly known as: XANAX   Take 1 mg by mouth 3 (three) times daily as needed. anxiety      Biotin 5000 MCG Caps   Take 5,000 mcg by mouth 2 (two) times daily.      CALCIUM + D PO   Take by mouth every morning. Has magnesium and zinc in it also.      diclofenac 1.3 % Ptch   Commonly known as: FLECTOR   Place 1 patch onto the skin daily.      diclofenac 75 MG EC tablet   Commonly known as: VOLTAREN   Take 75 mg by mouth 2 (two) times daily.      escitalopram 20 MG tablet   Commonly known as: LEXAPRO   Take 1 tablet (20 mg total) by mouth daily.      hydrocortisone 10 MG tablet   Commonly known as: CORTEF   Take 1 tablet (10 mg total) by mouth 2 (two) times daily.      levothyroxine 150 MCG tablet   Commonly known as: SYNTHROID, LEVOTHROID   Take 1 tablet (150 mcg total) by mouth daily.      methadone 10 MG tablet   Commonly known as: DOLOPHINE   Take 10 mg by mouth 4 (four) times daily.      MULTIVITAL PO   Take by mouth every morning.      omeprazole 40 MG capsule   Commonly known as: PRILOSEC   Take 1 capsule (40 mg total) by mouth daily.      oxyCODONE-acetaminophen 10-325 MG per tablet   Commonly known as: PERCOCET   Take 1 tablet by mouth every 6 (six) hours as needed. pain      promethazine 25 MG tablet   Commonly known as:  PHENERGAN   Take 25 mg by mouth at bedtime as needed. nausea      tiZANidine 4 MG tablet   Commonly known as: ZANAFLEX   Take 4 mg by mouth every 6 (six) hours as needed. Muscle spasms      topiramate 200 MG tablet   Commonly known as: TOPAMAX   Take 200 mg by mouth at bedtime.      Vitamin D (Ergocalciferol) 50000 UNITS Caps   Commonly known as: DRISDOL   Take 50,000 Units by mouth 2 (two) times a week. Mondays and thursdays        Discharge Condition: stable  Disposition: 01-Home or Self Care   Consults:     Significant Diagnostic Studies: Mr Laqueta Jean WU Contrast  Sep 24, 2011  *RADIOLOGY REPORT*  Clinical Data: Chronic headache, migraines.  MRI HEAD WITHOUT AND WITH CONTRAST  Technique:  Multiplanar, multiecho pulse sequences of the brain and surrounding structures were  obtained according to standard protocol without and with intravenous contrast  Contrast: 18mL MULTIHANCE GADOBENATE DIMEGLUMINE 529 MG/ML IV SOLN  Comparison: None.  Findings:  There is no evidence for acute infarction, intracranial hemorrhage, mass lesion, hydrocephalus, or extra-axial fluid. There may be slight premature atrophy.  There is no significant chronic microvascular ischemic change.  Normal pituitary and cerebellar tonsils.  Intact calvarium.  Extracranial soft tissues unremarkable including the orbits, paranasal sinus, and mastoids.  Post infusion, no abnormal enhancement of the brain or meninges. Major intracranial vascular structures widely patent.  IMPRESSION: Slight atrophy.  No acute or focal intracranial abnormality.  No abnormal post contrast enhancement.   Original Report Authenticated By: Elsie Stain, M.D.       Microbiology: No results found for this or any previous visit (from the past 240 hour(s)).   Labs: Results for orders placed during the hospital encounter of 09/19/11 (from the past 48 hour(s))  CBC WITH DIFFERENTIAL     Status: Normal   Collection Time   09/19/11 10:20 PM       Component Value Range Comment   WBC 5.7  4.0 - 10.5 K/uL    RBC 4.13  3.87 - 5.11 MIL/uL    Hemoglobin 12.8  12.0 - 15.0 g/dL    HCT 16.1  09.6 - 04.5 %    MCV 89.6  78.0 - 100.0 fL    MCH 31.0  26.0 - 34.0 pg    MCHC 34.6  30.0 - 36.0 g/dL    RDW 40.9  81.1 - 91.4 %    Platelets 240  150 - 400 K/uL    Neutrophils Relative 59  43 - 77 %    Neutro Abs 3.4  1.7 - 7.7 K/uL    Lymphocytes Relative 30  12 - 46 %    Lymphs Abs 1.7  0.7 - 4.0 K/uL    Monocytes Relative 8  3 - 12 %    Monocytes Absolute 0.5  0.1 - 1.0 K/uL    Eosinophils Relative 2  0 - 5 %    Eosinophils Absolute 0.1  0.0 - 0.7 K/uL    Basophils Relative 1  0 - 1 %    Basophils Absolute 0.0  0.0 - 0.1 K/uL   COMPREHENSIVE METABOLIC PANEL     Status: Abnormal   Collection Time   09/19/11 10:20 PM      Component Value Range Comment   Sodium 125 (*) 135 - 145 mEq/L    Potassium 3.7  3.5 - 5.1 mEq/L    Chloride 91 (*) 96 - 112 mEq/L    CO2 26  19 - 32 mEq/L    Glucose, Bld 48 (*) 70 - 99 mg/dL    BUN 24 (*) 6 - 23 mg/dL    Creatinine, Ser 7.82  0.50 - 1.10 mg/dL    Calcium 8.8  8.4 - 95.6 mg/dL    Total Protein 7.4  6.0 - 8.3 g/dL    Albumin 4.0  3.5 - 5.2 g/dL    AST 48 (*) 0 - 37 U/L    ALT 45 (*) 0 - 35 U/L    Alkaline Phosphatase 85  39 - 117 U/L    Total Bilirubin 0.2 (*) 0.3 - 1.2 mg/dL    GFR calc non Af Amer 74 (*) >90 mL/min    GFR calc Af Amer 86 (*) >90 mL/min   CORTISOL     Status: Normal   Collection Time   09/19/11  11:54 PM      Component Value Range Comment   Cortisol, Plasma 1.8     GLUCOSE, CAPILLARY     Status: Abnormal   Collection Time   09/20/11  2:57 AM      Component Value Range Comment   Glucose-Capillary 64 (*) 70 - 99 mg/dL   GLUCOSE, CAPILLARY     Status: Normal   Collection Time   09/20/11  3:20 AM      Component Value Range Comment   Glucose-Capillary 83  70 - 99 mg/dL   HEPATITIS PANEL, ACUTE     Status: Normal   Collection Time   09/20/11  3:43 AM      Component Value Range  Comment   Hepatitis B Surface Ag NEGATIVE  NEGATIVE    HCV Ab NEGATIVE  NEGATIVE    Hep A IgM NEGATIVE  NEGATIVE    Hep B C IgM NEGATIVE  NEGATIVE   BASIC METABOLIC PANEL     Status: Abnormal   Collection Time   09/20/11  3:43 AM      Component Value Range Comment   Sodium 128 (*) 135 - 145 mEq/L    Potassium 3.8  3.5 - 5.1 mEq/L    Chloride 91 (*) 96 - 112 mEq/L    CO2 28  19 - 32 mEq/L    Glucose, Bld 81  70 - 99 mg/dL    BUN 23  6 - 23 mg/dL    Creatinine, Ser 1.61  0.50 - 1.10 mg/dL    Calcium 8.5  8.4 - 09.6 mg/dL    GFR calc non Af Amer 79 (*) >90 mL/min    GFR calc Af Amer >90  >90 mL/min   CBC     Status: Abnormal   Collection Time   09/20/11  3:43 AM      Component Value Range Comment   WBC 5.3  4.0 - 10.5 K/uL    RBC 3.81 (*) 3.87 - 5.11 MIL/uL    Hemoglobin 11.7 (*) 12.0 - 15.0 g/dL    HCT 04.5 (*) 40.9 - 46.0 %    MCV 89.5  78.0 - 100.0 fL    MCH 30.7  26.0 - 34.0 pg    MCHC 34.3  30.0 - 36.0 g/dL    RDW 81.1  91.4 - 78.2 %    Platelets 201  150 - 400 K/uL   GLUCOSE, CAPILLARY     Status: Normal   Collection Time   09/20/11  6:07 AM      Component Value Range Comment   Glucose-Capillary 86  70 - 99 mg/dL   GLUCOSE, CAPILLARY     Status: Normal   Collection Time   09/20/11  9:49 AM      Component Value Range Comment   Glucose-Capillary 80  70 - 99 mg/dL    Comment 1 Notify RN     TSH     Status: Abnormal   Collection Time   09/20/11 10:10 AM      Component Value Range Comment   TSH 20.024 (*) 0.350 - 4.500 uIU/mL   HEMOGLOBIN A1C     Status: Normal   Collection Time   09/20/11 10:10 AM      Component Value Range Comment   Hemoglobin A1C 5.0  <5.7 %    Mean Plasma Glucose 97  <117 mg/dL   C-PEPTIDE     Status: Normal   Collection Time   09/20/11 10:10 AM  Component Value Range Comment   C-Peptide 2.13  0.80 - 3.90 ng/mL   INSULIN, RANDOM     Status: Normal   Collection Time   09/20/11 10:10 AM      Component Value Range Comment   Insulin 9  3 - 28  uIU/mL   GLUCOSE, CAPILLARY     Status: Normal   Collection Time   09/20/11  1:51 PM      Component Value Range Comment   Glucose-Capillary 77  70 - 99 mg/dL    Comment 1 Notify RN     GLUCOSE, CAPILLARY     Status: Abnormal   Collection Time   09/20/11  6:11 PM      Component Value Range Comment   Glucose-Capillary 122 (*) 70 - 99 mg/dL    Comment 1 Notify RN     GLUCOSE, CAPILLARY     Status: Abnormal   Collection Time   09/20/11  8:51 PM      Component Value Range Comment   Glucose-Capillary 58 (*) 70 - 99 mg/dL   GLUCOSE, CAPILLARY     Status: Abnormal   Collection Time   09/20/11 10:09 PM      Component Value Range Comment   Glucose-Capillary 129 (*) 70 - 99 mg/dL   GLUCOSE, CAPILLARY     Status: Abnormal   Collection Time   09/21/11 12:46 AM      Component Value Range Comment   Glucose-Capillary 109 (*) 70 - 99 mg/dL   BASIC METABOLIC PANEL     Status: Abnormal   Collection Time   09/21/11  3:42 AM      Component Value Range Comment   Sodium 132 (*) 135 - 145 mEq/L    Potassium 3.7  3.5 - 5.1 mEq/L    Chloride 98  96 - 112 mEq/L    CO2 29  19 - 32 mEq/L    Glucose, Bld 117 (*) 70 - 99 mg/dL    BUN 18  6 - 23 mg/dL    Creatinine, Ser 1.61  0.50 - 1.10 mg/dL    Calcium 8.1 (*) 8.4 - 10.5 mg/dL    GFR calc non Af Amer 79 (*) >90 mL/min    GFR calc Af Amer >90  >90 mL/min   GLUCOSE, CAPILLARY     Status: Abnormal   Collection Time   09/21/11  4:12 AM      Component Value Range Comment   Glucose-Capillary 139 (*) 70 - 99 mg/dL   GLUCOSE, CAPILLARY     Status: Normal   Collection Time   09/21/11  7:51 AM      Component Value Range Comment   Glucose-Capillary 74  70 - 99 mg/dL      HPI :Valerie Silva is a 51 y.o. female with history of chronic pain, migraines, whole went to her primary care physician for a yearly physical today. The lab results returned and the patient was found to have hyponatremia and hypoglycemia referred to the emergency room.  She's been on  disability for years and on chronic pain medications for years so she is used to feeling kind of dizzy and weak. There have been no recent changes in her medications   HOSPITAL COURSE:   #1 Severe hypoglycemia-of unclear cause-patient reports that she's been having the problems for years.  She was found to have Random cortisol level of 1.8. Suspect adrenal insufficiency , she was started on hydrocortisone replacement , hydrocortisone 20 mg by mouth  in am and 10 mg in pm.  twice a day  #2 Hyponatremia-, she was found to have a low cortisol of 1.8 and a TSH of greater than 20, she is also on hydrochlorothiazide. We'll discontinue that medication. She responded well to IV hydration as well as fluid restriction orally, sodium has improved to 132  #3 Hypothyroidism-TSH of 20.024,  Continue Synthroid dose increased to 150, she will need close outpatient monitoring, she may benefit from an outpatient endocrinology consult  #4 Chronic pain syndrome /migraine headaches and fibromyalgia-continue methadone at current dose Chronic headaches, MRI of the brain done however no significant pathology found   #5 hypertension antihypertensive medications discontinued, patient's blood pressure was marginally low in the 100 systolic therefore these will not be resumed  Discharge Exam:  Blood pressure 98/58, pulse 68, temperature 98.3 F (36.8 C), temperature source Oral, resp. rate 17, height 5\' 8"  (1.727 m), weight 86.183 kg (190 lb), SpO2 98.00%.   General: Alert and oriented x3, head normocephalic atraumatic  Eyes: Pupil equal round react to light and accommodation, extraocular movements intact  ENT: No pharyngeal erythema  Neck: No jugular venous distention  Cardiovascular: Regular rate and rhythm without murmurs rubs or gallops  Respiratory: Clear to auscultation bilaterally  Abdomen: Soft nontender, bowel sounds are present  Skin: Pale dry without rashes  Musculoskeletal: Intact  Psychiatric:  Euthymic  Neurologic: Cranial nerves 2-12 intact, strength 5/5 all 4 extremities, sensation intact       Discharge Orders    Future Appointments: Provider: Department: Dept Phone: Center:   09/22/2011 1:15 PM Rachael Fee [facility] [phone] None   09/22/2011 1:45 PM Josph Macho, MD [facility] [phone] None   09/22/2011 2:15 PM [facility] [phone] None        Signed: Richarda Overlie 09/21/2011, 9:52 AM

## 2011-09-21 NOTE — Care Management Note (Signed)
    Page 1 of 1   09/21/2011     11:19:58 AM   CARE MANAGEMENT NOTE 09/21/2011  Patient:  Valerie Silva, Valerie Silva   Account Number:  [id number]  Date Initiated:  09/21/2011  Documentation initiated by:  Lanier Clam  Subjective/Objective Assessment:   ADMITTED W/HYPOGLYCEMIA     Action/Plan:   FROM HOME   Anticipated DC Date:  09/21/2011   Anticipated DC Plan:  HOME/SELF CARE      DC Planning Services  CM consult      Choice offered to / List presented to:             Status of service:  Completed, signed off Medicare Important Message given?   (If response is "NO", the following Medicare IM given date fields will be blank) Date Medicare IM given:   Date Additional Medicare IM given:    Discharge Disposition:    Per UR Regulation:  Reviewed for med. necessity/level of care/duration of stay  If discussed at Long Length of Stay Meetings, dates discussed:    Comments:  09/21/11 [facility] Maurizio Geno RN,BSN NCM [phone]

## 2011-09-22 ENCOUNTER — Ambulatory Visit: Payer: Medicare Other

## 2011-09-22 ENCOUNTER — Ambulatory Visit: Payer: Medicare Other | Admitting: Hematology & Oncology

## 2011-09-22 ENCOUNTER — Other Ambulatory Visit: Payer: Medicare Other | Admitting: Lab

## 2011-09-22 LAB — BASIC METABOLIC PANEL
BUN: 16 mg/dL (ref 6–23)
CO2: 26 mEq/L (ref 19–32)
Chloride: 104 mEq/L (ref 96–112)
Creatinine, Ser: 0.7 mg/dL (ref 0.50–1.10)

## 2011-09-22 LAB — IRON AND TIBC
Iron: 83 ug/dL (ref 42–135)
Saturation Ratios: 35 % (ref 20–55)
TIBC: 237 ug/dL — ABNORMAL LOW (ref 250–470)
UIBC: 154 ug/dL (ref 125–400)

## 2011-09-22 MED ORDER — HYDROCORTISONE 10 MG PO TABS: 10.0000 mg | ORAL_TABLET | Freq: Two times a day (BID) | ORAL | Status: AC

## 2011-09-22 NOTE — ED Provider Notes (Signed)
Medical screening examination/treatment/procedure(s) were performed by non-physician practitioner and as supervising physician I was immediately available for consultation/collaboration.   Trevar Boehringer L Lanaysia Fritchman, MD 09/22/11 1028 

## 2011-09-22 NOTE — Evaluation (Signed)
Physical Therapy Evaluation Patient Details Name: Valerie Silva MRN: [id number] DOB: April 21, 1960 Today's Date: 09/22/2011 Time: 1478-2956 PT Time Calculation (min): 25 min  PT Assessment / Plan / Recommendation Clinical Impression  51 yo female admitted with hypoglycemia admits to not eating well at home and appears to be having some relationship problems, is able to walk independently with a cane.  She says this is her baseline status.  Do not feel she needs additional PT at this time, but would encourage pt to walk ad lib with nursing staff.    PT Assessment  Patent does not need any further PT services    Follow Up Recommendations  No PT follow up    Barriers to Discharge        Equipment Recommendations  None recommended by PT    Recommendations for Other Services     Frequency      Precautions / Restrictions     Pertinent Vitals/Pain Pt did not report pain during mobility      Mobility  Bed Mobility Bed Mobility: Supine to Sit Supine to Sit: 7: Independent Transfers Transfers: Sit to Stand;Stand to Sit Sit to Stand: 7: Independent Stand to Sit: 7: Independent Ambulation/Gait Ambulation/Gait Assistance: 6: Modified independent (Device/Increase time) Assistive device: Straight cane Ambulation/Gait Assistance Details: pt appears comfortable walking with straight cane Gait Pattern: Within Functional Limits Gait velocity: WFL General Gait Details: pt uses straight cane for balance and support in gait and appears to do well with it.  No loss of balance noted Stairs: No Wheelchair Mobility Wheelchair Mobility: No    Exercises     PT Diagnosis:    PT Problem List:   PT Treatment Interventions:     PT Goals    Visit Information  Last PT Received On: 09/22/11 Assistance Needed: +1    Subjective Data  Subjective: "I don't ear much at home"  Pt became tearful talking about grieving for her parents who died within 4 months of each other in 2011 and family  problems since that time Patient Stated Goal: to move into a new apartment soon   Prior Functioning  Home Living Lives With: Alone Type of Home: Apartment Home Access: Stairs to enter Entrance Stairs-Number of Steps: hopes to move into a  level entry apt. soon Entrance Stairs-Rails: Right;Left Home Layout: One level Home Adaptive Equipment: Walker - rolling;Straight cane Additional Comments: pt reports her RW is "buried " somewhere  in the shed out back Prior Function Level of Independence: Independent with assistive device(s) Able to Take Stairs?: Yes Communication Communication: No difficulties    Cognition  Overall Cognitive Status: Appears within functional limits for tasks assessed/performed Arousal/Alertness: Awake/alert Orientation Level: Appears intact for tasks assessed Behavior During Session: [facility] for tasks performed Cognition - Other Comments: pt easily emotional about difficutly family relationships    Extremity/Trunk Assessment Right Lower Extremity Assessment RLE ROM/Strength/Tone: [facility] for tasks assessed;Deficits RLE ROM/Strength/Tone Deficits: pt appears to have diffuse muscle atrophy Left Lower Extremity Assessment LLE ROM/Strength/Tone: [facility] for tasks assessed;Deficits LLE ROM/Strength/Tone Deficits: diffuse muscle atrophy Trunk Assessment Trunk Assessment: Kyphotic ( anterior abdomen)   Balance Balance Balance Assessed: No  End of Session PT - End of Session Activity Tolerance: Patient tolerated treatment well Patient left: in bed;Other (comment) (with CSW in room) Nurse Communication: Mobility status  GP Functional Assessment Tool Used: clinical judgement Functional Limitation: Mobility: Walking and moving around Mobility: Walking and Moving Around Current Status (O1308): At least 1 percent but less than 20 percent  impaired, limited or restricted Mobility: Walking and Moving Around Goal Status [phone]): At least 1 percent but less than 20 percent impaired,  limited or restricted Mobility: Walking and Moving Around Discharge Status [phone]): At least 1 percent but less than 20 percent impaired, limited or restricted   Donnetta Hail 09/22/2011, 3:50 PM

## 2011-09-22 NOTE — Progress Notes (Signed)
[facility] PROGRESS NOTE  Valerie Silva AOZ:[id number] DOB: 27-Dec-1960 DOA: 09/19/2011 PCP: [name], MD  Assessment/Plan: Principal Problem:  *Hypoglycemia Active Problems:  HYPOTHYROIDISM  FIBROMYALGIA  SLEEP APNEA  Vitamin B 12 deficiency  Depression with anxiety  Chronic anemia  Hyponatremia  Abnormal LFTs   #1 Severe hypoglycemia-of unclear cause-patient reports that she's been having the problems for years.  She was found to have Random cortisol level of 1.8. Suspect adrenal insufficiency , she was started on hydrocortisone replacement , hydrocortisone 20 mg by mouth in am and 10 mg in pm. twice a day   #2 Hyponatremia-, she was found to have a low cortisol of 1.8 and a TSH of greater than 20, she is also on hydrochlorothiazide. We'll discontinue that medication. She responded well to IV hydration as well as fluid restriction orally, sodium has improved to 132   #3 Hypothyroidism-TSH of 20.024, Continue Synthroid dose increased to 150, she will need close outpatient monitoring, she may benefit from an outpatient endocrinology consult   #4 Chronic pain syndrome /migraine headaches and fibromyalgia-continue methadone at current dose Chronic headaches, MRI of the brain done however no significant pathology found   #5 hypertension antihypertensive medications discontinued, patient's blood pressure was marginally low in the 100 systolic therefore these will not be resumed  6. Anemia per family request patient given vit b 12 inj, also requesting iron ,level to be checked,    Code Status: full Family Communication: family updated about patient's clinical progress Disposition Plan:  As above    Brief narrative: Valerie Silva is a 51 y.o. female with history of chronic pain, migraines, whole went to her primary care physician for a yearly physical today. The lab results returned and the patient was found to have hyponatremia and hypoglycemia referred to the emergency  room. She's been on disability for years and on chronic pain medications for years so she is used to feeling kind of dizzy and weak. There have been no recent changes in her medications  Final discharge medication list    Medication List     As of 09/22/2011  7:29 AM    STOP taking these medications         aspirin 81 MG EC tablet      triamterene-hydrochlorothiazide 75-50 MG per tablet   Commonly known as: MAXZIDE      TAKE these medications         ALPRAZolam 1 MG tablet   Commonly known as: XANAX   Take 1 mg by mouth 3 (three) times daily as needed. anxiety      Biotin 5000 MCG Caps   Take 5,000 mcg by mouth 2 (two) times daily.      CALCIUM + D PO   Take by mouth every morning. Has magnesium and zinc in it also.      diclofenac 1.3 % Ptch   Commonly known as: FLECTOR   Place 1 patch onto the skin daily.      diclofenac 75 MG EC tablet   Commonly known as: VOLTAREN   Take 75 mg by mouth 2 (two) times daily.      escitalopram 20 MG tablet   Commonly known as: LEXAPRO   Take 1 tablet (20 mg total) by mouth daily.      hydrocortisone 10 MG tablet   Commonly known as: CORTEF   Take 20 mg in the morning and 10 mg in the evening       levothyroxine 150 MCG  tablet   Commonly known as: SYNTHROID, LEVOTHROID   Take 1 tablet (150 mcg total) by mouth daily.      levothyroxine 150 MCG tablet   Commonly known as: SYNTHROID, LEVOTHROID   Take 1 tablet (150 mcg total) by mouth daily with breakfast.      methadone 10 MG tablet   Commonly known as: DOLOPHINE   Take 10 mg by mouth 4 (four) times daily.      MULTIVITAL PO   Take by mouth every morning.      omeprazole 40 MG capsule   Commonly known as: PRILOSEC   Take 1 capsule (40 mg total) by mouth daily.      oxyCODONE-acetaminophen 10-325 MG per tablet   Commonly known as: PERCOCET   Take 1 tablet by mouth every 6 (six) hours as needed. pain      promethazine 25 MG tablet   Commonly known as: PHENERGAN   Take  25 mg by mouth at bedtime as needed. nausea      tiZANidine 4 MG tablet   Commonly known as: ZANAFLEX   Take 4 mg by mouth every 6 (six) hours as needed. Muscle spasms      topiramate 200 MG tablet   Commonly known as: TOPAMAX   Take 200 mg by mouth at bedtime.      Vitamin D (Ergocalciferol) 50000 UNITS Caps   Commonly known as: DRISDOL   Take 50,000 Units by mouth 2 (two) times a week. Mondays and thursdays          Objective: Filed Vitals:   09/20/11 2052 09/21/11 0443 09/21/11 1322 09/21/11 2153  BP: 104/58 98/58 84/45  122/73  Pulse: 78 68 64 71  Temp: 98.4 F (36.9 C) 98.3 F (36.8 C) 98.4 F (36.9 C) 97.9 F (36.6 C)  TempSrc: Oral Oral Oral Oral  Resp: 17 17 18 18   Height:      Weight:      SpO2: 95% 98% 98% 98%    Intake/Output Summary (Last 24 hours) at 09/22/11 0105 Last data filed at 09/21/11 1610  Gross per 24 hour  Intake   1696 ml  Output      0 ml  Net   1696 ml    Exam:  HENT:  Head: Atraumatic.  Nose: Nose normal.  Mouth/Throat: Oropharynx is clear and moist.  Eyes: Conjunctivae are normal. Pupils are equal, round, and reactive to light. No scleral icterus.  Neck: Neck supple. No tracheal deviation present.  Cardiovascular: Normal rate, regular rhythm, normal heart sounds and intact distal pulses.  Pulmonary/Chest: Effort normal and breath sounds normal. No respiratory distress.  Abdominal: Soft. Normal appearance and bowel sounds are normal. She exhibits no distension. There is no tenderness.  Musculoskeletal: She exhibits no edema and no tenderness.  Neurological: She is alert. No cranial nerve deficit.    Data Reviewed: Basic Metabolic Panel:  Lab 09/21/11 9604 09/20/11 0343 09/19/11 2220  NA 132* 128* 125*  K 3.7 3.8 3.7  CL 98 91* 91*  CO2 29 28 26   GLUCOSE 117* 81 48*  BUN 18 23 24*  CREATININE 0.84 0.84 0.89  CALCIUM 8.1* 8.5 8.8  MG -- -- --  PHOS -- -- --    Liver Function Tests:  Lab 09/19/11 2220  AST 48*  ALT  45*  ALKPHOS 85  BILITOT 0.2*  PROT 7.4  ALBUMIN 4.0   No results found for this basename: LIPASE:5,AMYLASE:5 in the last 168 hours No results found for  this basename: AMMONIA:5 in the last 168 hours  CBC:  Lab Sep 22, 2011 0343 09/19/11 2220  WBC 5.3 5.7  NEUTROABS -- 3.4  HGB 11.7* 12.8  HCT 34.1* 37.0  MCV 89.5 89.6  PLT 201 240    Cardiac Enzymes: No results found for this basename: CKTOTAL:5,CKMB:5,CKMBINDEX:5,TROPONINI:5 in the last 168 hours BNP (last 3 results)  Basename 07/31/11 0245  PROBNP 206.2*     CBG:  Lab 09/21/11 2308 09/21/11 1619 09/21/11 1143 09/21/11 0751 09/21/11 0412  GLUCAP 82 109* 71 74 139*    No results found for this or any previous visit (from the past 240 hour(s)).   Studies: Mr Laqueta Jean WU Contrast  September 22, 2011  *RADIOLOGY REPORT*  Clinical Data: Chronic headache, migraines.  MRI HEAD WITHOUT AND WITH CONTRAST  Technique:  Multiplanar, multiecho pulse sequences of the brain and surrounding structures were obtained according to standard protocol without and with intravenous contrast  Contrast: 18mL MULTIHANCE GADOBENATE DIMEGLUMINE 529 MG/ML IV SOLN  Comparison: None.  Findings:  There is no evidence for acute infarction, intracranial hemorrhage, mass lesion, hydrocephalus, or extra-axial fluid. There may be slight premature atrophy.  There is no significant chronic microvascular ischemic change.  Normal pituitary and cerebellar tonsils.  Intact calvarium.  Extracranial soft tissues unremarkable including the orbits, paranasal sinus, and mastoids.  Post infusion, no abnormal enhancement of the brain or meninges. Major intracranial vascular structures widely patent.  IMPRESSION: Slight atrophy.  No acute or focal intracranial abnormality.  No abnormal post contrast enhancement.   Original Report Authenticated By: Elsie Stain, M.D.     Scheduled Meds:   . cyanocobalamin  1,000 mcg Intramuscular Once  . diclofenac  2 patch Transdermal Daily  .  diclofenac  75 mg Oral BID  . hydrocortisone  10 mg Oral QHS  . hydrocortisone  10 mg Oral Once  . hydrocortisone  20 mg Oral Daily  . levothyroxine  150 mcg Oral Q breakfast  . methadone  10 mg Oral QID  . pantoprazole  40 mg Oral Q1200  . topiramate  200 mg Oral QHS  . Vitamin D (Ergocalciferol)  50,000 Units Oral 2 times weekly  . DISCONTD: aspirin EC  81 mg Oral Daily  . DISCONTD: diclofenac  1 patch Transdermal Daily  . DISCONTD: enoxaparin (LOVENOX) injection  40 mg Subcutaneous Q24H  . DISCONTD: ferric gluconate (FERRLECIT/NULECIT) Test Dose  25 mg Intravenous Once  . DISCONTD: hydrocortisone  10 mg Oral BID  . DISCONTD: hydrocortisone  10 mg Oral Daily  . DISCONTD: hydrocortisone  20 mg Oral Daily  . DISCONTD: hydrocortisone  20 mg Oral Daily  . DISCONTD: levothyroxine  137 mcg Oral Daily  . DISCONTD: levothyroxine  50 mcg Oral QAC breakfast   Continuous Infusions:   . DISCONTD: sodium chloride 1,000 mL (09/21/11 0447)    Principal Problem:  *Hypoglycemia Active Problems:  HYPOTHYROIDISM  FIBROMYALGIA  SLEEP APNEA  Vitamin B 12 deficiency  Depression with anxiety  Chronic anemia  Hyponatremia  Abnormal LFTs    Time spent: 40 minutes   [facility]  [facility] Pager [phone]. If 8PM-8AM, please contact night-coverage at [email or web address], password [facility] 09/22/2011, 1:05 AM  LOS: 3 days

## 2011-10-18 ENCOUNTER — Other Ambulatory Visit: Payer: Self-pay | Admitting: *Deleted

## 2011-10-18 DIAGNOSIS — F418 Other specified anxiety disorders: Secondary | ICD-10-CM

## 2011-10-18 MED ORDER — ALPRAZOLAM 1 MG PO TABS: 1.0000 mg | ORAL_TABLET | Freq: Three times a day (TID) | ORAL | Status: DC | PRN

## 2011-10-18 NOTE — Telephone Encounter (Signed)
Pt called yesterday to r/s her missed appt.from when she was in the hospital. Raiford Noble offered her an appt one day this week but she was unable to come in at that time. She is will run out of her Xanax before her new appt scheduled on 10/16. Explained that a new rx would be sent to [facility] to get her through until she comes to her next appt (this is a chronic medication for the pt). She verbalized understanding. Will route rx to Dr [name] for approval and signature then fax to [facility] at [phone].

## 2011-10-26 ENCOUNTER — Other Ambulatory Visit (HOSPITAL_BASED_OUTPATIENT_CLINIC_OR_DEPARTMENT_OTHER): Payer: Medicare Other | Admitting: Lab

## 2011-10-26 ENCOUNTER — Ambulatory Visit (HOSPITAL_BASED_OUTPATIENT_CLINIC_OR_DEPARTMENT_OTHER): Payer: Medicare Other

## 2011-10-26 ENCOUNTER — Ambulatory Visit (HOSPITAL_BASED_OUTPATIENT_CLINIC_OR_DEPARTMENT_OTHER): Payer: Medicare Other | Admitting: Hematology & Oncology

## 2011-10-26 VITALS — BP 98/55 | HR 69 | Temp 98.2°F | Resp 18 | Ht 68.0 in | Wt 194.0 lb

## 2011-10-26 DIAGNOSIS — D510 Vitamin B12 deficiency anemia due to intrinsic factor deficiency: Secondary | ICD-10-CM

## 2011-10-26 DIAGNOSIS — D509 Iron deficiency anemia, unspecified: Secondary | ICD-10-CM

## 2011-10-26 DIAGNOSIS — Z9884 Bariatric surgery status: Secondary | ICD-10-CM

## 2011-10-26 DIAGNOSIS — N189 Chronic kidney disease, unspecified: Secondary | ICD-10-CM

## 2011-10-26 DIAGNOSIS — F419 Anxiety disorder, unspecified: Secondary | ICD-10-CM

## 2011-10-26 DIAGNOSIS — F411 Generalized anxiety disorder: Secondary | ICD-10-CM

## 2011-10-26 DIAGNOSIS — D631 Anemia in chronic kidney disease: Secondary | ICD-10-CM

## 2011-10-26 DIAGNOSIS — F418 Other specified anxiety disorders: Secondary | ICD-10-CM

## 2011-10-26 LAB — MORPHOLOGY - CHCC SATELLITE: Platelet Morphology: NORMAL

## 2011-10-26 LAB — CBC WITH DIFFERENTIAL (CANCER CENTER ONLY)
Eosinophils Absolute: 0.2 10*3/uL (ref 0.0–0.5)
HCT: 33.1 % — ABNORMAL LOW (ref 34.8–46.6)
HGB: 10.6 g/dL — ABNORMAL LOW (ref 11.6–15.9)
LYMPH%: 27.4 % (ref 14.0–48.0)
MCV: 100 fL (ref 81–101)
MONO#: 0.3 10*3/uL (ref 0.1–0.9)
NEUT%: 61.3 % (ref 39.6–80.0)
Platelets: 142 10*3/uL — ABNORMAL LOW (ref 145–400)
RBC: 3.32 10*6/uL — ABNORMAL LOW (ref 3.70–5.32)
WBC: 4.9 10*3/uL (ref 3.9–10.0)

## 2011-10-26 MED ORDER — CYANOCOBALAMIN 1000 MCG/ML IJ SOLN
1000.0000 ug | Freq: Once | INTRAMUSCULAR | Status: AC
  Administered 2011-10-26: 1000 ug via INTRAMUSCULAR

## 2011-10-26 MED ORDER — ALPRAZOLAM 1 MG PO TABS: 1.0000 mg | ORAL_TABLET | Freq: Three times a day (TID) | ORAL | Status: DC | PRN

## 2011-10-26 NOTE — Progress Notes (Signed)
This office note has been dictated.

## 2011-10-27 LAB — ERYTHROPOIETIN: Erythropoietin: 9.5 m[IU]/mL (ref 2.6–34.0)

## 2011-10-27 LAB — RETICULOCYTES (CHCC): Retic Ct Pct: 1.2 % (ref 0.4–2.3)

## 2011-10-27 LAB — IRON AND TIBC: TIBC: 253 ug/dL (ref 250–470)

## 2011-10-27 NOTE — Progress Notes (Signed)
DIAGNOSES: 1. 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 2. 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 3. Severe anxiety.  CURRENT THERAPY: 1. IV iron as indicated. 2. Vitamin B12, 1 mg IM q. month.  INTERIM HISTORY:  Valerie Silva comes in for followup.  We last saw her here back in May.  Since then, she has been in the hospital, I think, a couple of times.  She had problems with profound hyponatremia and hypoglycemia.  I do not know if this may be related to her being hypothyroid.  Her Synthroid dose was adjusted.  She was hospitalized back in July.  She had chest pain back at that point in time.  She was then hospitalized back in September with the hyponatremia and hypoglycemia.  She, I think, last got iron back in July.  She has a set of issues socially.  Her son, who has had a lot of problems, I think, is moving away.  Hopefully, this will help Valerie Silva with stress.  There are still issues with her family.  Both her parents have died.  There are issues with her siblings.  She has not had any obvious bleeding.  She has had no nausea or vomiting.  She has had no leg swelling.  There have been no rashes.  PHYSICAL EXAMINATION:  General:  This is a well-developed, well- nourished white female, in no obvious distress.  Vital signs: Temperature of 98.2, pulse 69, respiratory rate 18, blood pressure 98/55.  Weight is 194.  Head and neck:  Normocephalic, atraumatic skull. There are no ocular or oral lesions.  There are no palpable cervical or supraclavicular lymph nodes.  Lungs:  Clear bilaterally.  Cardiac: Regular rate and rhythm, with normal S1 and S2.  There are no murmurs, rubs, or bruits.  Abdomen:  Soft, with good bowel sounds.  There is no palpable abdominal mass.  There is no palpable hepatosplenomegaly. Back:  No tenderness over the spine, ribs, or hips.  Extremities:  Show no clubbing, cyanosis, or edema.  LABORATORY STUDIES:  White cell count  4.9, hemoglobin 10.6, hematocrit 33.1, platelet count 142.  IMPRESSION:  Valerie Silva is a 51 year old white female who has had gastric bypass.  She has pernicious anemia and iron-deficiency anemia.  We will go ahead and give her B12 today.  I am not sure why her hemoglobin would be low.  We will see what her iron studies show.  I want to see her back in about 6 weeks' time.    ______________________________ Josph Macho, M.D. PRE/MEDQ  D:  10/26/2011  T:  10/27/2011  Job:  [id number]

## 2011-12-29 ENCOUNTER — Other Ambulatory Visit (HOSPITAL_BASED_OUTPATIENT_CLINIC_OR_DEPARTMENT_OTHER): Payer: Medicare Other | Admitting: Lab

## 2011-12-29 ENCOUNTER — Ambulatory Visit (HOSPITAL_BASED_OUTPATIENT_CLINIC_OR_DEPARTMENT_OTHER): Payer: Medicare Other

## 2011-12-29 ENCOUNTER — Ambulatory Visit (HOSPITAL_BASED_OUTPATIENT_CLINIC_OR_DEPARTMENT_OTHER): Payer: Medicare Other | Admitting: Hematology & Oncology

## 2011-12-29 VITALS — BP 107/65 | HR 75 | Temp 98.1°F | Resp 18 | Ht 68.0 in | Wt 208.0 lb

## 2011-12-29 DIAGNOSIS — D510 Vitamin B12 deficiency anemia due to intrinsic factor deficiency: Secondary | ICD-10-CM

## 2011-12-29 DIAGNOSIS — K922 Gastrointestinal hemorrhage, unspecified: Secondary | ICD-10-CM

## 2011-12-29 DIAGNOSIS — D509 Iron deficiency anemia, unspecified: Secondary | ICD-10-CM

## 2011-12-29 DIAGNOSIS — F418 Other specified anxiety disorders: Secondary | ICD-10-CM

## 2011-12-29 DIAGNOSIS — D500 Iron deficiency anemia secondary to blood loss (chronic): Secondary | ICD-10-CM

## 2011-12-29 DIAGNOSIS — F411 Generalized anxiety disorder: Secondary | ICD-10-CM

## 2011-12-29 LAB — CBC WITH DIFFERENTIAL (CANCER CENTER ONLY)
BASO%: 0.9 % (ref 0.0–2.0)
LYMPH%: 34.3 % (ref 14.0–48.0)
MCH: 31.8 pg (ref 26.0–34.0)
MCHC: 32.6 g/dL (ref 32.0–36.0)
MCV: 98 fL (ref 81–101)
MONO%: 6.4 % (ref 0.0–13.0)
Platelets: 167 10*3/uL (ref 145–400)
RDW: 12.4 % (ref 11.1–15.7)

## 2011-12-29 LAB — FERRITIN: Ferritin: 68 ng/mL (ref 10–291)

## 2011-12-29 MED ORDER — CYANOCOBALAMIN 1000 MCG/ML IJ SOLN
1000.0000 ug | Freq: Once | INTRAMUSCULAR | Status: AC
  Administered 2011-12-29: 1000 ug via INTRAMUSCULAR

## 2011-12-29 MED ORDER — ALPRAZOLAM 2 MG PO TABS: 2.0000 mg | ORAL_TABLET | Freq: Three times a day (TID) | ORAL | Status: DC | PRN

## 2011-12-29 NOTE — Progress Notes (Signed)
This office note has been dictated.

## 2011-12-29 NOTE — Patient Instructions (Signed)
Cyanocobalamin, Vitamin B12 injection What is this medicine? CYANOCOBALAMIN (sye an oh koe BAL a min) is a man made form of vitamin B12. Vitamin B12 is used in the growth of healthy blood cells, nerve cells, and proteins in the body. It also helps with the metabolism of fats and carbohydrates. This medicine is used to treat people who can not absorb vitamin B12. This medicine may be used for other purposes; ask your health care provider or pharmacist if you have questions. What should I tell my health care provider before I take this medicine? They need to know if you have any of these conditions: -kidney disease -Leber's disease -megaloblastic anemia -an unusual or allergic reaction to cyanocobalamin, cobalt, other medicines, foods, dyes, or preservatives -pregnant or trying to get pregnant -breast-feeding How should I use this medicine? This medicine is injected into a muscle or deeply under the skin. It is usually given by a health care professional in a clinic or doctor's office. However, your doctor may teach you how to inject yourself. Follow all instructions. Talk to your pediatrician regarding the use of this medicine in children. Special care may be needed. Overdosage: If you think you have taken too much of this medicine contact a poison control center or emergency room at once. NOTE: This medicine is only for you. Do not share this medicine with others. What if I miss a dose? If you are given your dose at a clinic or doctor's office, call to reschedule your appointment. If you give your own injections and you miss a dose, take it as soon as you can. If it is almost time for your next dose, take only that dose. Do not take double or extra doses. What may interact with this medicine? -colchicine -heavy alcohol intake This list may not describe all possible interactions. Give your health care provider a list of all the medicines, herbs, non-prescription drugs, or dietary supplements you  use. Also tell them if you smoke, drink alcohol, or use illegal drugs. Some items may interact with your medicine. What should I watch for while using this medicine? Visit your doctor or health care professional regularly. You may need blood work done while you are taking this medicine. You may need to follow a special diet. Talk to your doctor. Limit your alcohol intake and avoid smoking to get the best benefit. What side effects may I notice from receiving this medicine? Side effects that you should report to your doctor or health care professional as soon as possible: -allergic reactions like skin rash, itching or hives, swelling of the face, lips, or tongue -blue tint to skin -chest tightness, pain -difficulty breathing, wheezing -dizziness -red, swollen painful area on the leg Side effects that usually do not require medical attention (report to your doctor or health care professional if they continue or are bothersome): -diarrhea -headache This list may not describe all possible side effects. Call your doctor for medical advice about side effects. You may report side effects to FDA at 1-800-FDA-1088. Where should I keep my medicine? Keep out of the reach of children. Store at room temperature between 15 and 30 degrees C (59 and 85 degrees F). Protect from light. Throw away any unused medicine after the expiration date. NOTE: This sheet is a summary. It may not cover all possible information. If you have questions about this medicine, talk to your doctor, pharmacist, or health care provider.  2013, [employer]. (04/09/2007 10:10:20 PM)

## 2011-12-30 ENCOUNTER — Telehealth: Payer: Self-pay | Admitting: Oncology
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
## 2012-01-26 ENCOUNTER — Ambulatory Visit: Payer: Medicare Other

## 2012-02-21 ENCOUNTER — Other Ambulatory Visit: Payer: Self-pay | Admitting: Family Medicine

## 2012-02-22 ENCOUNTER — Telehealth: Payer: Self-pay | Admitting: Hematology & Oncology

## 2012-02-22 NOTE — Telephone Encounter (Signed)
Patient apt cx and resch for 03/08/12.  Left message with son Ronaldo Miyamoto

## 2012-02-23 ENCOUNTER — Ambulatory Visit: Payer: Medicare Other | Admitting: Hematology & Oncology

## 2012-02-23 ENCOUNTER — Other Ambulatory Visit: Payer: Medicare Other | Admitting: Lab

## 2012-02-23 ENCOUNTER — Ambulatory Visit: Payer: Medicare Other

## 2012-03-07 ENCOUNTER — Telehealth: Payer: Self-pay | Admitting: Hematology & Oncology

## 2012-03-07 ENCOUNTER — Encounter (HOSPITAL_COMMUNITY): Payer: Medicare Other

## 2012-03-07 NOTE — Telephone Encounter (Signed)
Patient called and changed 03/08/12 apt time from 2:45 to 10:15

## 2012-03-08 ENCOUNTER — Other Ambulatory Visit (HOSPITAL_BASED_OUTPATIENT_CLINIC_OR_DEPARTMENT_OTHER): Payer: Medicare Other | Admitting: Lab

## 2012-03-08 ENCOUNTER — Ambulatory Visit (HOSPITAL_BASED_OUTPATIENT_CLINIC_OR_DEPARTMENT_OTHER): Payer: Medicare Other

## 2012-03-08 ENCOUNTER — Ambulatory Visit (HOSPITAL_BASED_OUTPATIENT_CLINIC_OR_DEPARTMENT_OTHER): Payer: Medicare Other | Admitting: Hematology & Oncology

## 2012-03-08 ENCOUNTER — Other Ambulatory Visit: Payer: Medicare Other | Admitting: Lab

## 2012-03-08 ENCOUNTER — Ambulatory Visit: Payer: Medicare Other | Admitting: Hematology & Oncology

## 2012-03-08 ENCOUNTER — Telehealth: Payer: Self-pay | Admitting: Hematology & Oncology

## 2012-03-08 ENCOUNTER — Ambulatory Visit: Payer: Medicare Other

## 2012-03-08 VITALS — BP 100/65 | HR 85 | Temp 98.1°F | Resp 16 | Ht 68.0 in | Wt 214.0 lb

## 2012-03-08 DIAGNOSIS — D510 Vitamin B12 deficiency anemia due to intrinsic factor deficiency: Secondary | ICD-10-CM

## 2012-03-08 DIAGNOSIS — D509 Iron deficiency anemia, unspecified: Secondary | ICD-10-CM

## 2012-03-08 DIAGNOSIS — F418 Other specified anxiety disorders: Secondary | ICD-10-CM

## 2012-03-08 DIAGNOSIS — K922 Gastrointestinal hemorrhage, unspecified: Secondary | ICD-10-CM

## 2012-03-08 LAB — CBC WITH DIFFERENTIAL (CANCER CENTER ONLY)
BASO#: 0 10*3/uL (ref 0.0–0.2)
Eosinophils Absolute: 0.2 10*3/uL (ref 0.0–0.5)
HCT: 38.2 % (ref 34.8–46.6)
HGB: 12.7 g/dL (ref 11.6–15.9)
LYMPH%: 33.2 % (ref 14.0–48.0)
MCH: 30.6 pg (ref 26.0–34.0)
MCV: 92 fL (ref 81–101)
MONO#: 0.3 10*3/uL (ref 0.1–0.9)
MONO%: 8.8 % (ref 0.0–13.0)
NEUT%: 52.3 % (ref 39.6–80.0)
Platelets: 154 10*3/uL (ref 145–400)
RBC: 4.15 10*6/uL (ref 3.70–5.32)
WBC: 3.9 10*3/uL (ref 3.9–10.0)

## 2012-03-08 LAB — IRON AND TIBC
%SAT: 31 % (ref 20–55)
Iron: 110 ug/dL (ref 42–145)

## 2012-03-08 LAB — FERRITIN: Ferritin: 80 ng/mL (ref 10–291)

## 2012-03-08 MED ORDER — CYANOCOBALAMIN 1000 MCG/ML IJ SOLN
1000.0000 ug | Freq: Once | INTRAMUSCULAR | Status: AC
  Administered 2012-03-08: 1000 ug via INTRAMUSCULAR

## 2012-03-08 NOTE — Progress Notes (Signed)
This office note has been dictated.

## 2012-03-08 NOTE — Telephone Encounter (Signed)
Left message with Son (talked to him, he said he had something to write it down with) with 4-7 appointment time and date.

## 2012-03-08 NOTE — Patient Instructions (Signed)
Vitamin B12 Injections Every person needs vitamin B12. A deficiency develops when the body does not get enough of it. One way to overcome this is by getting B12 shots (injections). A B12 shot puts the vitamin directly into muscle tissue. This avoids any problems your body might have in absorbing it from food or a pill. In some people, the body has trouble using the vitamin correctly. This can cause a B12 deficiency. Not consuming enough of the vitamin can also cause a deficiency. Getting enough vitamin B12 can be hard for elderly people. Sometimes, they do not eat a well-balanced diet. The elderly are also more likely than younger people to have medical conditions or take medications that can lead to a deficiency. WHAT DOES VITAMIN B12 DO? Vitamin B12 does many things to help the body work right:  It helps the body make healthy red blood cells.  It helps maintain nerve cells.  It is involved in the body's process of converting food into energy (metabolism).  It is needed to make the genetic material in all cells (DNA). VITAMIN B12 FOOD SOURCES Most people get plenty of vitamin B12 through the foods they eat. It is present in:  Meat, fish, poultry, and eggs.  Milk and milk products.  It also is added when certain foods are made, including some breads, cereals and yogurts. The food is then called "fortified". CAUSES The most common causes of vitamin B12 deficiency are:  Pernicious anemia. The condition develops when the body cannot make enough healthy red blood cells. This stems from a lack of a protein made in the stomach (intrinsic factor). People without this protein cannot absorb enough vitamin B12 from food.  Malabsorption. This is when the body cannot absorb the vitamin. It can be caused by:  Pernicious anemia.  Surgery to remove part or all of the stomach can lead to malabsorption. Removal of part or all of the small intestine can also cause malabsorption.  Vegetarian diet.  People who are strict about not eating foods from animals could have trouble taking in enough vitamin B12 from diet alone.  Medications. Some medicines have been linked to B12 deficiency, such as Metformin (a drug prescribed for type 2 diabetes). Long-term use of stomach acid suppressants also can keep the vitamin from being absorbed.  Intestinal problems such as inflammatory bowel disease. If there are problems in the digestive tract, vitamin B12 may not be absorbed in good enough amounts. SYMPTOMS People who do not get enough B12 can develop problems. These can include:  Anemia. This is when the body has too few red blood cells. Red blood cells carry oxygen to the rest of the body. Without a healthy supply of red blood cells, people can feel:  Tired (fatigued).  Weak.  Severe anemia can cause:  Shortness of breath.  Dizziness.  Rapid heart rate.  Paleness.  Other Vitamin B12 deficiency symptoms include:  Diarrhea.  Numbness or tingling in the hands or feet.  Loss of appetite.  Confusion.  Sores on the tongue or in the mouth. LET YOUR CAREGIVER KNOW ABOUT:  Any allergies. It is very important to know if you are allergic or sensitive to cobalt. Vitamin B12 contains cobalt.  Any history of kidney disease.  All medications you are taking. Include prescription and over-the-counter medicines, herbs and creams.  Whether you are pregnant or breast-feeding.  If you have Leber's disease, a hereditary eye condition, vitamin B12 could make it worse. RISKS AND COMPLICATIONS Reactions to an injection are   usually temporary. They might include:  Pain at the injection site.  Redness, swelling or tenderness at the site.  Headache, dizziness or weakness.  Nausea, upset stomach or diarrhea.  Numbness or tingling.  Fever.  Joint pain.  Itching or rash. If a reaction does not go away in a short while, talk with your healthcare provider. A change in the way the shots are  given, or where they are given, might need to be made. BEFORE AN INJECTION To decide whether B12 injections are right for you, your healthcare provider will probably:  Ask about your medical history.  Ask questions about your diet.  Ask about symptoms such as:  Have you felt weak?  Do you feel unusually tired?  Do you get dizzy?  Order blood tests. These may include a test to:  Check the level of red cells in your blood.  Measure B12 levels.  Check for the presence of intrinsic factor. VITAMIN B12 INJECTIONS How often you will need a vitamin B12 injection will depend on how severe your deficiency is. This also will affect how long you will need to get them. People with pernicious anemia usually get injections for their entire life. Others might get them for a shorter period. For many people, injections are given daily or weekly for several weeks. Then, once B12 levels are normal, injections are given just once a month. If the cause of the deficiency can be fixed, the injections can be stopped. Talk with your healthcare provider about what you should expect. For an injection:  The injection site will be cleaned with an alcohol swab.  Your healthcare provider will insert a needle directly into a muscle. Most any muscle can be used. Most often, an arm muscle is used. A buttocks muscle can also be used. Many people say shots in that area are less painful.  A small adhesive bandage may be put over the injection site. It usually can be taken off in an hour or less. Injections can be given by your healthcare provider. In some cases, family members give them. Sometimes, people give them to themselves. Talk with your healthcare provider about what would be best for you. If someone other than your healthcare provider will be giving the shots, the person will need to be trained to give them correctly. HOME CARE INSTRUCTIONS   You can remove the adhesive bandage within an hour of getting a  shot.  You should be able to go about your normal activities right away.  Avoid drinking large amounts of alcohol while taking vitamin B12 shots. Alcohol can interfere with the body's use of the vitamin. SEEK MEDICAL CARE IF:   Pain, redness, swelling or tenderness at the injection site does not get better or gets worse.  Headache, dizziness or weakness does not go away.  You develop a fever of more than 100.5 F (38.1 C). SEEK IMMEDIATE MEDICAL CARE IF:   You have chest pain.  You develop shortness of breath.  You have muscle weakness that gets worse.  You develop numbness, weakness or tingling on one side or one area of the body.  You have symptoms of an allergic reaction, such as:  Hives.  Difficulty breathing.  Swelling of the lips, face, tongue or throat.  You develop a fever of more than 102.0 F (38.9 C). MAKE SURE YOU:   Understand these instructions.  Will watch your condition.  Will get help right away if you are not doing well or get worse. Document   Released: 03/25/2008 Document Revised: 03/21/2011 Document Reviewed: 03/25/2008 Exit[employer] Patient Information 2013 [employer].  
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
## 2012-03-15 ENCOUNTER — Other Ambulatory Visit: Payer: Self-pay | Admitting: Family Medicine

## 2012-03-19 ENCOUNTER — Ambulatory Visit
Admission: RE | Admit: 2012-03-19 | Discharge: 2012-03-19 | Disposition: A | Discharge status: Home / Self Care | Payer: Medicare Other | Source: Ambulatory Visit | Attending: Family Medicine | Admitting: Family Medicine

## 2012-04-16 ENCOUNTER — Ambulatory Visit: Payer: Medicare Other

## 2012-04-16 ENCOUNTER — Other Ambulatory Visit: Payer: Medicare Other | Admitting: Lab

## 2012-04-16 ENCOUNTER — Telehealth: Payer: Self-pay | Admitting: Hematology & Oncology

## 2012-04-16 ENCOUNTER — Ambulatory Visit: Payer: Medicare Other | Admitting: Hematology & Oncology

## 2012-04-16 NOTE — Telephone Encounter (Signed)
Left message for pt to call and reschedule missed appointment from today. °

## 2012-04-16 NOTE — Progress Notes (Signed)
Patient did not show up for her appointment

## 2012-04-27 ENCOUNTER — Other Ambulatory Visit: Payer: Self-pay | Admitting: *Deleted

## 2012-04-27 DIAGNOSIS — D510 Vitamin B12 deficiency anemia due to intrinsic factor deficiency: Secondary | ICD-10-CM

## 2012-04-27 DIAGNOSIS — F418 Other specified anxiety disorders: Secondary | ICD-10-CM

## 2012-04-27 MED ORDER — ALPRAZOLAM 2 MG PO TABS: 2.0000 mg | ORAL_TABLET | Freq: Three times a day (TID) | ORAL | Status: DC | PRN

## 2012-04-27 NOTE — Telephone Encounter (Signed)
Pt called stating she is out of her Xanax. Said her pharmacy tried to fax a refill request but was sending it to the ED instead of the office. She missed her last appt earlier this month "because I believe it was the day I was sick. I didn't realize it until Raiford Noble called me".  Reviewed with Dr [name], ok to refill x 1. He will provide her with another rx when she comes in for her next visit.

## 2012-05-07 ENCOUNTER — Ambulatory Visit (HOSPITAL_BASED_OUTPATIENT_CLINIC_OR_DEPARTMENT_OTHER): Payer: Medicare Other

## 2012-05-07 ENCOUNTER — Other Ambulatory Visit (HOSPITAL_BASED_OUTPATIENT_CLINIC_OR_DEPARTMENT_OTHER): Payer: Medicare Other | Admitting: Lab

## 2012-05-07 ENCOUNTER — Ambulatory Visit (HOSPITAL_BASED_OUTPATIENT_CLINIC_OR_DEPARTMENT_OTHER): Payer: Medicare Other | Admitting: Hematology & Oncology

## 2012-05-07 VITALS — BP 107/69 | HR 75 | Temp 97.8°F | Resp 16 | Ht 68.0 in | Wt 226.0 lb

## 2012-05-07 DIAGNOSIS — D510 Vitamin B12 deficiency anemia due to intrinsic factor deficiency: Secondary | ICD-10-CM

## 2012-05-07 DIAGNOSIS — Z9884 Bariatric surgery status: Secondary | ICD-10-CM

## 2012-05-07 DIAGNOSIS — F418 Other specified anxiety disorders: Secondary | ICD-10-CM

## 2012-05-07 DIAGNOSIS — D509 Iron deficiency anemia, unspecified: Secondary | ICD-10-CM

## 2012-05-07 DIAGNOSIS — F064 Anxiety disorder due to known physiological condition: Secondary | ICD-10-CM

## 2012-05-07 LAB — CBC WITH DIFFERENTIAL (CANCER CENTER ONLY)
BASO%: 0.7 % (ref 0.0–2.0)
HCT: 38.5 % (ref 34.8–46.6)
LYMPH#: 1.6 10*3/uL (ref 0.9–3.3)
MONO#: 0.3 10*3/uL (ref 0.1–0.9)
NEUT%: 47.3 % (ref 39.6–80.0)
Platelets: 167 10*3/uL (ref 145–400)
RDW: 14.1 % (ref 11.1–15.7)
WBC: 4.2 10*3/uL (ref 3.9–10.0)

## 2012-05-07 LAB — IRON AND TIBC
%SAT: 23 % (ref 20–55)
Iron: 87 ug/dL (ref 42–145)
TIBC: 371 ug/dL (ref 250–470)

## 2012-05-07 MED ORDER — CYANOCOBALAMIN 1000 MCG/ML IJ SOLN
1000.0000 ug | Freq: Once | INTRAMUSCULAR | Status: AC
  Administered 2012-05-07: 1000 ug via INTRAMUSCULAR

## 2012-05-07 MED ORDER — ALPRAZOLAM 2 MG PO TABS: 2.0000 mg | ORAL_TABLET | Freq: Three times a day (TID) | ORAL | Status: DC | PRN

## 2012-05-07 NOTE — Patient Instructions (Signed)

## 2012-05-07 NOTE — Progress Notes (Signed)
This office note has been dictated.

## 2012-05-07 NOTE — Addendum Note (Signed)
Addended by: Arlan Organ R on: 05/07/2012 12:48 PM   Modules accepted: Orders
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
## 2012-05-10 ENCOUNTER — Telehealth: Payer: Self-pay | Admitting: Hematology & Oncology

## 2012-05-10 ENCOUNTER — Other Ambulatory Visit: Payer: Self-pay | Admitting: *Deleted

## 2012-05-10 ENCOUNTER — Telehealth: Payer: Self-pay | Admitting: *Deleted

## 2012-05-10 DIAGNOSIS — D510 Vitamin B12 deficiency anemia due to intrinsic factor deficiency: Secondary | ICD-10-CM

## 2012-05-10 NOTE — Telephone Encounter (Signed)
Message copied by Anselm Jungling on Thu May 10, 2012 10:49 AM ------      Message from: Arlan Organ R      Created: Tue May 08, 2012  1:32 PM       Call - iron is dropping again  Please set up Feraheme at 1020mg  x 1 dose next week.  Pete ------

## 2012-05-10 NOTE — Telephone Encounter (Signed)
Called patient to let her know that her iron is dropping again.  Patient needs Feraheme 1020 mg IV in next week.  Patient connected to scheduling to schedule

## 2012-05-10 NOTE — Telephone Encounter (Signed)
Per RN to sch 1hr inf apt.  Patient sch apt for 05/17/12 at 2pm

## 2012-05-14 ENCOUNTER — Ambulatory Visit: Payer: Medicare Other

## 2012-05-17 ENCOUNTER — Ambulatory Visit (HOSPITAL_BASED_OUTPATIENT_CLINIC_OR_DEPARTMENT_OTHER): Payer: Medicare Other

## 2012-05-17 DIAGNOSIS — D510 Vitamin B12 deficiency anemia due to intrinsic factor deficiency: Secondary | ICD-10-CM

## 2012-05-17 DIAGNOSIS — D509 Iron deficiency anemia, unspecified: Secondary | ICD-10-CM

## 2012-05-17 MED ORDER — DIPHENHYDRAMINE HCL 25 MG PO CAPS
25.0000 mg | ORAL_CAPSULE | Freq: Once | ORAL | Status: AC
  Administered 2012-05-17: 25 mg via ORAL

## 2012-05-17 MED ORDER — METHYLPREDNISOLONE SODIUM SUCC 125 MG IJ SOLR
60.0000 mg | Freq: Once | INTRAMUSCULAR | Status: AC
  Administered 2012-05-17: 60 mg via INTRAVENOUS

## 2012-05-17 MED ORDER — SODIUM CHLORIDE 0.9 % IV SOLN
1020.0000 mg | Freq: Once | INTRAVENOUS | Status: AC
  Administered 2012-05-17: 1020 mg via INTRAVENOUS
  Filled 2012-05-17: qty 34

## 2012-05-17 NOTE — Patient Instructions (Signed)
Ferumoxytol injection What is this medicine? FERUMOXYTOL is an iron complex. Iron is used to make healthy red blood cells, which carry oxygen and nutrients throughout the body. This medicine is used to treat iron deficiency anemia in people with chronic kidney disease. This medicine may be used for other purposes; ask your health care provider or pharmacist if you have questions. What should I tell my health care provider before I take this medicine? They need to know if you have any of these conditions: -anemia not caused by low iron levels -high levels of iron in the blood -magnetic resonance imaging (MRI) test scheduled -an unusual or allergic reaction to iron, other medicines, foods, dyes, or preservatives -pregnant or trying to get pregnant -breast-feeding How should I use this medicine? This medicine is for infusion into a vein. It is given by a health care professional in a hospital or clinic setting. Talk to your pediatrician regarding the use of this medicine in children. Special care may be needed. Overdosage: If you think you've taken too much of this medicine contact a poison control center or emergency room at once. Overdosage: If you think you have taken too much of this medicine contact a poison control center or emergency room at once. NOTE: This medicine is only for you. Do not share this medicine with others. What if I miss a dose? It is important not to miss your dose. Call your doctor or health care professional if you are unable to keep an appointment. What may interact with this medicine? This medicine may interact with the following medications: -other iron products This list may not describe all possible interactions. Give your health care provider a list of all the medicines, herbs, non-prescription drugs, or dietary supplements you use. Also tell them if you smoke, drink alcohol, or use illegal drugs. Some items may interact with your medicine. What should I watch  for while using this medicine? Visit your doctor or healthcare professional regularly. Tell your doctor or healthcare professional if your symptoms do not start to get better or if they get worse. You may need blood work done while you are taking this medicine. You may need to follow a special diet. Talk to your doctor. Foods that contain iron include: whole grains/cereals, dried fruits, beans, or peas, leafy green vegetables, and organ meats (liver, kidney). What side effects may I notice from receiving this medicine? Side effects that you should report to your doctor or health care professional as soon as possible: -allergic reactions like skin rash, itching or hives, swelling of the face, lips, or tongue -breathing problems -changes in blood pressure -feeling faint or lightheaded, falls -fever or chills -flushing, sweating, or hot feelings -swelling of the ankles or feet Side effects that usually do not require medical attention (Report these to your doctor or health care professional if they continue or are bothersome.): -diarrhea -headache -nausea, vomiting -stomach pain This list may not describe all possible side effects. Call your doctor for medical advice about side effects. You may report side effects to FDA at 1-800-FDA-1088. Where should I keep my medicine? This drug is given in a hospital or clinic and will not be stored at home. NOTE: This sheet is a summary. It may not cover all possible information. If you have questions about this medicine, talk to your doctor, pharmacist, or health care provider.  2013, [employer]. (09/19/2007 9:48:25 PM)  

## 2012-05-28 DIAGNOSIS — M25519 Pain in unspecified shoulder: Secondary | ICD-10-CM | POA: Insufficient documentation

## 2012-05-28 DIAGNOSIS — G894 Chronic pain syndrome: Secondary | ICD-10-CM | POA: Insufficient documentation

## 2012-05-28 DIAGNOSIS — M546 Pain in thoracic spine: Secondary | ICD-10-CM | POA: Insufficient documentation

## 2012-05-28 DIAGNOSIS — M25569 Pain in unspecified knee: Secondary | ICD-10-CM | POA: Insufficient documentation

## 2012-05-29 ENCOUNTER — Inpatient Hospital Stay (HOSPITAL_COMMUNITY)
Admission: EM | Admit: 2012-05-29 | Discharge: 2012-06-01 | DRG: 641 | Disposition: A | Discharge status: Home / Self Care | Payer: Medicare Other | Attending: Internal Medicine | Admitting: Internal Medicine

## 2012-05-29 ENCOUNTER — Encounter (HOSPITAL_COMMUNITY): Payer: Self-pay | Admitting: *Deleted

## 2012-05-29 ENCOUNTER — Emergency Department (HOSPITAL_COMMUNITY): Payer: Medicare Other

## 2012-05-29 DIAGNOSIS — M129 Arthropathy, unspecified: Secondary | ICD-10-CM

## 2012-05-29 DIAGNOSIS — E669 Obesity, unspecified: Secondary | ICD-10-CM

## 2012-05-29 DIAGNOSIS — E876 Hypokalemia: Secondary | ICD-10-CM | POA: Diagnosis present

## 2012-05-29 DIAGNOSIS — F411 Generalized anxiety disorder: Secondary | ICD-10-CM | POA: Diagnosis present

## 2012-05-29 DIAGNOSIS — Z6833 Body mass index (BMI) 33.0-33.9, adult: Secondary | ICD-10-CM

## 2012-05-29 DIAGNOSIS — M7989 Other specified soft tissue disorders: Secondary | ICD-10-CM

## 2012-05-29 DIAGNOSIS — R0989 Other specified symptoms and signs involving the circulatory and respiratory systems: Secondary | ICD-10-CM | POA: Diagnosis present

## 2012-05-29 DIAGNOSIS — G8929 Other chronic pain: Secondary | ICD-10-CM | POA: Diagnosis present

## 2012-05-29 DIAGNOSIS — D649 Anemia, unspecified: Secondary | ICD-10-CM

## 2012-05-29 DIAGNOSIS — R079 Chest pain, unspecified: Secondary | ICD-10-CM

## 2012-05-29 DIAGNOSIS — I2584 Coronary atherosclerosis due to calcified coronary lesion: Secondary | ICD-10-CM | POA: Diagnosis present

## 2012-05-29 DIAGNOSIS — Z79899 Other long term (current) drug therapy: Secondary | ICD-10-CM

## 2012-05-29 DIAGNOSIS — R0789 Other chest pain: Secondary | ICD-10-CM | POA: Diagnosis present

## 2012-05-29 DIAGNOSIS — D510 Vitamin B12 deficiency anemia due to intrinsic factor deficiency: Secondary | ICD-10-CM

## 2012-05-29 DIAGNOSIS — IMO0001 Reserved for inherently not codable concepts without codable children: Secondary | ICD-10-CM

## 2012-05-29 DIAGNOSIS — D696 Thrombocytopenia, unspecified: Secondary | ICD-10-CM | POA: Diagnosis present

## 2012-05-29 DIAGNOSIS — R404 Transient alteration of awareness: Secondary | ICD-10-CM

## 2012-05-29 DIAGNOSIS — F418 Other specified anxiety disorders: Secondary | ICD-10-CM

## 2012-05-29 DIAGNOSIS — E860 Dehydration: Principal | ICD-10-CM | POA: Diagnosis present

## 2012-05-29 DIAGNOSIS — R7989 Other specified abnormal findings of blood chemistry: Secondary | ICD-10-CM

## 2012-05-29 DIAGNOSIS — E538 Deficiency of other specified B group vitamins: Secondary | ICD-10-CM

## 2012-05-29 DIAGNOSIS — R0609 Other forms of dyspnea: Secondary | ICD-10-CM | POA: Diagnosis present

## 2012-05-29 DIAGNOSIS — M810 Age-related osteoporosis without current pathological fracture: Secondary | ICD-10-CM | POA: Diagnosis present

## 2012-05-29 DIAGNOSIS — R945 Abnormal results of liver function studies: Secondary | ICD-10-CM

## 2012-05-29 DIAGNOSIS — E871 Hypo-osmolality and hyponatremia: Secondary | ICD-10-CM

## 2012-05-29 DIAGNOSIS — I2510 Atherosclerotic heart disease of native coronary artery without angina pectoris: Secondary | ICD-10-CM | POA: Diagnosis present

## 2012-05-29 DIAGNOSIS — G4730 Sleep apnea, unspecified: Secondary | ICD-10-CM

## 2012-05-29 DIAGNOSIS — E119 Type 2 diabetes mellitus without complications: Secondary | ICD-10-CM

## 2012-05-29 DIAGNOSIS — E039 Hypothyroidism, unspecified: Secondary | ICD-10-CM | POA: Diagnosis present

## 2012-05-29 DIAGNOSIS — J310 Chronic rhinitis: Secondary | ICD-10-CM

## 2012-05-29 DIAGNOSIS — I959 Hypotension, unspecified: Secondary | ICD-10-CM | POA: Diagnosis present

## 2012-05-29 DIAGNOSIS — E162 Hypoglycemia, unspecified: Secondary | ICD-10-CM

## 2012-05-29 LAB — COMPREHENSIVE METABOLIC PANEL
ALT: 49 U/L — ABNORMAL HIGH (ref 0–35)
Albumin: 3.4 g/dL — ABNORMAL LOW (ref 3.5–5.2)
Alkaline Phosphatase: 81 U/L (ref 39–117)
BUN: 34 mg/dL — ABNORMAL HIGH (ref 6–23)
Potassium: 3.3 mEq/L — ABNORMAL LOW (ref 3.5–5.1)
Sodium: 140 mEq/L (ref 135–145)
Total Protein: 6.6 g/dL (ref 6.0–8.3)

## 2012-05-29 LAB — CBC WITH DIFFERENTIAL/PLATELET
Basophils Relative: 1 % (ref 0–1)
Eosinophils Absolute: 0.1 10*3/uL (ref 0.0–0.7)
MCH: 31 pg (ref 26.0–34.0)
MCHC: 33.6 g/dL (ref 30.0–36.0)
Neutrophils Relative %: 60 % (ref 43–77)
Platelets: 154 10*3/uL (ref 150–400)
RDW: 14 % (ref 11.5–15.5)

## 2012-05-29 LAB — POCT I-STAT TROPONIN I: Troponin i, poc: 0 ng/mL (ref 0.00–0.08)

## 2012-05-29 MED ORDER — SODIUM CHLORIDE 0.9 % IV BOLUS (SEPSIS)
1000.0000 mL | Freq: Once | INTRAVENOUS | Status: AC
  Administered 2012-05-29: 1000 mL via INTRAVENOUS

## 2012-05-29 MED ORDER — SODIUM CHLORIDE 0.9 % IV SOLN
INTRAVENOUS | Status: DC
  Administered 2012-05-30: 1000 mL via INTRAVENOUS

## 2012-05-29 MED ORDER — HYDROMORPHONE HCL PF 2 MG/ML IJ SOLN
2.0000 mg | Freq: Once | INTRAMUSCULAR | Status: AC
  Administered 2012-05-29: 2 mg via INTRAVENOUS
  Filled 2012-05-29: qty 1

## 2012-05-29 MED ORDER — NITROGLYCERIN 0.4 MG SL SUBL
0.4000 mg | SUBLINGUAL_TABLET | SUBLINGUAL | Status: DC | PRN
  Filled 2012-05-29: qty 25

## 2012-05-29 MED ORDER — ASPIRIN 81 MG PO CHEW: 324.0000 mg | CHEWABLE_TABLET | Freq: Once | ORAL | Status: DC

## 2012-05-29 NOTE — ED Notes (Signed)
PER EMS PT reported CP started tonight at 6:45pm pain in center chest to bil. Arms. . EMS gave PT 4 ASA tablets 81mg   , 2 NTG SL and zofran 4mg  IV. Pt reported CP of 10/10 that decreased to 7/10 after meds. Pt reported a family HX of heart DX.

## 2012-05-29 NOTE — ED Provider Notes (Signed)
I saw and evaluated the patient, reviewed the resident's note and I agree with the findings and plan.  Patient seen and examined. Describes persistent chest pressure x3 hours and is worse with exertion. Patient had CT of her chest for evaluation of PE and dissection. Will likely be admitted for ACS     Toy Baker, MD 05/29/12 2231

## 2012-05-29 NOTE — ED Provider Notes (Signed)
History     CSN: [id number]  Arrival date & time 05/29/12  2002   First MD Initiated Contact with Patient 05/29/12 2059      Chief Complaint  Patient presents with  . Chest Pain     Patient is a 52 y.o. female presenting with chest pain.  Chest Pain Pain location:  Substernal area Pain quality: aching and crushing   Pain radiates to:  Mid back, R arm and L arm Pain radiates to the back: yes   Pain severity:  Severe Onset quality:  Gradual Duration:  3 hours Timing:  Constant Progression since onset: improved to 7/10 after nitroglycerin given by EMS. Chronicity:  New Context: at rest   Context comment:  Awoke her from sleep Worsened by:  Exertion and movement Associated symptoms: back pain, nausea and shortness of breath   Associated symptoms: no abdominal pain, no altered mental status, no cough, no diaphoresis, no fever, no headache, no lower extremity edema, no near-syncope, no numbness, no orthopnea, no palpitations, no PND, not vomiting and no weakness   Risk factors: diabetes mellitus   Risk factors: no aortic disease, no coronary artery disease, no high cholesterol, no hypertension, no immobilization, no prior DVT/PE, no smoking and no surgery     Past Medical History  Diagnosis Date  . Osteoporosis   . Hypothyroidism   . Diabetes mellitus   . Exogenous obesity   . Rhinitis   . Arthritis   . Fibromyalgia   . Somnolence   . Sleep apnea   . Anemia, iron deficiency 04/06/2011  . Pernicious anemia 04/06/2011    Past Surgical History  Procedure Laterality Date  . Gastric bypass    . Cholecystectomy    . Tracheostomy    . Uvulectomy    . Tonsillectomy    . Mandible reconstruction    . Spinal fusion    . Dilation and curettage of uterus    . Cesarean section      History reviewed. No pertinent family history.  History  Substance Use Topics  . Smoking status: Never Smoker   . Smokeless tobacco: Not on file  . Alcohol Use: No    OB History   Grav  Para Term Preterm Abortions TAB SAB Ect Mult Living                  Review of Systems  Constitutional: Negative for fever, chills and diaphoresis.  HENT: Negative for congestion, rhinorrhea, neck pain and neck stiffness.   Respiratory: Positive for shortness of breath. Negative for cough and wheezing.   Cardiovascular: Positive for chest pain. Negative for palpitations, orthopnea, leg swelling, PND and near-syncope.  Gastrointestinal: Positive for nausea. Negative for vomiting, abdominal pain and diarrhea.  Genitourinary: Negative for dysuria, urgency, frequency, flank pain, vaginal bleeding, vaginal discharge and difficulty urinating.  Musculoskeletal: Positive for back pain.  Skin: Negative for rash.  Neurological: Negative for weakness, numbness and headaches.  Psychiatric/Behavioral: Negative for altered mental status.  All other systems reviewed and are negative.    Allergies  Iodine; Iron; Sulfonamide derivatives; Benazepril hcl; Codeine; Penicillins; Amitriptyline; and Lotensin  Home Medications   No current outpatient prescriptions on file.  BP 81/46  Pulse 59  Temp(Src) 97.4 F (36.3 C) (Oral)  Resp 16  Ht 5\' 9"  (1.753 m)  Wt 226 lb 10.1 oz (102.8 kg)  BMI 33.45 kg/m2  SpO2 95%  Physical Exam  Nursing note and vitals reviewed. Constitutional: She is oriented to person, place, and  time. She appears well-developed and well-nourished. No distress.  HENT:  Head: Normocephalic and atraumatic.  Mouth/Throat: Oropharynx is clear and moist.  Eyes: Conjunctivae and EOM are normal. Pupils are equal, round, and reactive to light. No scleral icterus.  Neck: Normal range of motion. Neck supple. No JVD present.  Cardiovascular: Normal rate, regular rhythm, normal heart sounds and intact distal pulses.  Exam reveals no gallop and no friction rub.   No murmur heard. Pulses:      Radial pulses are 2+ on the right side, and 2+ on the left side.  Pulmonary/Chest: Effort normal  and breath sounds normal. No respiratory distress. She has no wheezes. She has no rales.  Abdominal: Soft. Bowel sounds are normal. She exhibits no distension. There is no tenderness. There is no rebound and no guarding.  Musculoskeletal: She exhibits no edema.  Neurological: She is alert and oriented to person, place, and time. No cranial nerve deficit. She exhibits normal muscle tone. Coordination normal.  Skin: Skin is warm and dry. She is not diaphoretic.    ED Course  Procedures (including critical care time)  Labs Reviewed  COMPREHENSIVE METABOLIC PANEL - Abnormal; Notable for the following:    Potassium 3.3 (*)    BUN 34 (*)    Albumin 3.4 (*)    AST 42 (*)    ALT 49 (*)    Total Bilirubin 0.2 (*)    GFR calc non Af Amer 62 (*)    GFR calc Af Amer 72 (*)    All other components within normal limits  CBC WITH DIFFERENTIAL  POCT I-STAT TROPONIN I   Dg Chest 2 View  05/29/2012   *RADIOLOGY REPORT*  Clinical Data: Chest pain.  CHEST - 2 VIEW  Comparison: 07/30/2011  Findings: Heart size and mediastinal contours are normal.  Both lungs are clear.  No evidence of pleural effusion.  No mass or lymphadenopathy identified.  Cervical spine fusion hardware again noted.  IMPRESSION: No active disease.   Original Report Authenticated By: Myles Rosenthal, M.D.   Ct Angio Chest Pe W/cm &/or Wo Cm  05/30/2012   *RADIOLOGY REPORT*  Clinical Data: Chest pain.  CT ANGIOGRAPHY CHEST  Technique:  Multidetector CT imaging of the chest using the standard protocol during bolus administration of intravenous contrast. Multiplanar reconstructed images including MIPs were obtained and reviewed to evaluate the vascular anatomy.  Contrast: 1mL OMNIPAQUE IOHEXOL 350 MG/ML SOLN  Comparison: None  Findings: The chest wall is unremarkable.  No breast masses, supraclavicular or axillary adenopathy.  The thyroid gland is normal.  The bony thorax is intact.  Moderate degenerative changes involving the thoracic spine but no  destructive bone lesions. Cervical fusion hardware is noted.  The heart is normal in size.  No pericardial effusion.  No mediastinal or hilar lymphadenopathy.  Small scattered lymph nodes are noted.  The esophagus is grossly normal.  The aorta is normal in caliber.  No dissection.  Coronary artery calcifications are noted.  The pulmonary arterial tree is fairly well opacified.  No definite filling defects to suggest pulmonary emboli.  Examination of the lung parenchyma demonstrates no acute pulmonary findings.  Dependent atelectasis is noted.  No pleural effusion, pulmonary mass or worrisome pulmonary nodule.  The tracheobronchial tree is unremarkable.  The upper abdomen is unremarkable.  There are surgical changes from gastric bypass surgery.  IMPRESSION:  1.  No CT findings for pulmonary embolism. 2.  Normal thoracic aorta. 3.  No significant pulmonary findings. 4.  Coronary artery calcifications.   Original Report Authenticated By: Rudie Meyer, M.D.    Date: 05/29/2012  Rate: 73  Rhythm: normal sinus rhythm  QRS Axis: normal  Intervals: PR prolonged  ST/T Wave abnormalities: nonspecific T wave changes  Conduction Disutrbances:none  Narrative Interpretation:   Old EKG Reviewed: changes noted; flattening of T wave in lead III    1. Chest pain       MDM  52 yo F with history of DM and hypothyroidism presents with chest pain radiating to her back and both arms.  Worsened by exertion, movement, and deep breaths.  Normal vitals.  Non-toxic appearing.  Cardiopulmonary exam is unremarkable.  2+ bilateral radial pulses.    EKG with non-specific T wave flattening in III compared to prior; no acute ischemic changes. She was given ASA, Dilaudid for pain; NTG SL; pain imrproved but ongoing, 5/10 on reasesment; held nitro gtt due to soft BPs, 90s/60s  Troponin negative. Potassium 3.3  CXR with no acute cardiopulmonary disease.  CTA chest demonstrates CAD; no PE or aortic dissection  Admitted to  medicine for further cardiac risk stratification and management; stepdown unit due to ongoing pain         Toney Sang, MD 05/30/12 [phone]

## 2012-05-30 ENCOUNTER — Encounter (HOSPITAL_COMMUNITY): Payer: Self-pay | Admitting: *Deleted

## 2012-05-30 ENCOUNTER — Observation Stay (HOSPITAL_COMMUNITY): Payer: Medicare Other

## 2012-05-30 DIAGNOSIS — R079 Chest pain, unspecified: Secondary | ICD-10-CM

## 2012-05-30 DIAGNOSIS — E860 Dehydration: Principal | ICD-10-CM | POA: Diagnosis present

## 2012-05-30 DIAGNOSIS — E876 Hypokalemia: Secondary | ICD-10-CM | POA: Diagnosis present

## 2012-05-30 DIAGNOSIS — I959 Hypotension, unspecified: Secondary | ICD-10-CM | POA: Diagnosis present

## 2012-05-30 DIAGNOSIS — R0789 Other chest pain: Secondary | ICD-10-CM | POA: Diagnosis present

## 2012-05-30 LAB — GLUCOSE, CAPILLARY
Glucose-Capillary: 83 mg/dL (ref 70–99)
Glucose-Capillary: 80 mg/dL (ref 70–99)
Glucose-Capillary: 84 mg/dL (ref 70–99)

## 2012-05-30 LAB — TROPONIN I
Troponin I: <0.3 ng/mL (ref <0.30)
Troponin I: <0.3 ng/mL (ref <0.30)

## 2012-05-30 LAB — HEMOGLOBIN A1C: Hgb A1c MFr Bld: 4.9 % (ref <5.7)

## 2012-05-30 LAB — CBC
HCT: 34.7 % — ABNORMAL LOW (ref 36.0–46.0)
MCV: 93.8 fL (ref 78.0–100.0)
Platelets: 121 10*3/uL — ABNORMAL LOW (ref 150–400)
RBC: 3.7 MIL/uL — ABNORMAL LOW (ref 3.87–5.11)
RDW: 14.2 % (ref 11.5–15.5)
WBC: 3.9 10*3/uL — ABNORMAL LOW (ref 4.0–10.5)

## 2012-05-30 LAB — BASIC METABOLIC PANEL
CO2: 20 mEq/L (ref 19–32)
Chloride: 109 mEq/L (ref 96–112)
GFR calc Af Amer: 81 mL/min — ABNORMAL LOW (ref >90)
Potassium: 3.4 mEq/L — ABNORMAL LOW (ref 3.5–5.1)
Sodium: 140 mEq/L (ref 135–145)

## 2012-05-30 LAB — MRSA PCR SCREENING: MRSA by PCR: NEGATIVE

## 2012-05-30 MED ORDER — ASPIRIN 325 MG PO TABS
325.0000 mg | ORAL_TABLET | Freq: Every day | ORAL | Status: DC
  Administered 2012-05-30 – 2012-06-01 (×3): 325 mg via ORAL
  Filled 2012-05-30 (×3): qty 1

## 2012-05-30 MED ORDER — OXYCODONE HCL 5 MG PO TABS: 5.0000 mg | ORAL_TABLET | ORAL | Status: DC | PRN

## 2012-05-30 MED ORDER — TOPIRAMATE 100 MG PO TABS
200.0000 mg | ORAL_TABLET | Freq: Every day | ORAL | Status: DC
  Administered 2012-05-30 – 2012-05-31 (×2): 200 mg via ORAL
  Filled 2012-05-30 (×3): qty 2

## 2012-05-30 MED ORDER — TRIAMTERENE-HCTZ 75-50 MG PO TABS
1.0000 | ORAL_TABLET | Freq: Every day | ORAL | Status: DC
  Filled 2012-05-30: qty 1

## 2012-05-30 MED ORDER — ESCITALOPRAM OXALATE 20 MG PO TABS
20.0000 mg | ORAL_TABLET | Freq: Every day | ORAL | Status: DC
  Administered 2012-05-30 – 2012-06-01 (×3): 20 mg via ORAL
  Filled 2012-05-30 (×3): qty 1

## 2012-05-30 MED ORDER — OXYCODONE HCL ER 10 MG PO T12A
30.0000 mg | EXTENDED_RELEASE_TABLET | Freq: Three times a day (TID) | ORAL | Status: DC
  Filled 2012-05-30 (×3): qty 3

## 2012-05-30 MED ORDER — NITROGLYCERIN 2 % TD OINT: 0.5000 [in_us] | TOPICAL_OINTMENT | Freq: Four times a day (QID) | TRANSDERMAL | Status: DC

## 2012-05-30 MED ORDER — OXYCODONE-ACETAMINOPHEN 5-325 MG PO TABS
1.0000 | ORAL_TABLET | Freq: Three times a day (TID) | ORAL | Status: DC
  Administered 2012-05-30 (×2): 1 via ORAL
  Filled 2012-05-30 (×2): qty 1

## 2012-05-30 MED ORDER — OXYCODONE-ACETAMINOPHEN 5-325 MG PO TABS
2.0000 | ORAL_TABLET | Freq: Once | ORAL | Status: AC
  Administered 2012-05-30: 2 via ORAL
  Filled 2012-05-30: qty 2

## 2012-05-30 MED ORDER — INSULIN ASPART 100 UNIT/ML ~~LOC~~ SOLN: 0.0000 [IU] | Freq: Three times a day (TID) | SUBCUTANEOUS | Status: DC

## 2012-05-30 MED ORDER — METHOCARBAMOL 750 MG PO TABS
750.0000 mg | ORAL_TABLET | Freq: Three times a day (TID) | ORAL | Status: DC
  Filled 2012-05-30 (×3): qty 1

## 2012-05-30 MED ORDER — SODIUM CHLORIDE 0.9 % IV BOLUS (SEPSIS)
500.0000 mL | Freq: Once | INTRAVENOUS | Status: AC
  Administered 2012-05-30: 500 mL via INTRAVENOUS

## 2012-05-30 MED ORDER — METHOCARBAMOL 750 MG PO TABS
750.0000 mg | ORAL_TABLET | Freq: Three times a day (TID) | ORAL | Status: DC
  Administered 2012-05-30 – 2012-06-01 (×7): 750 mg via ORAL
  Filled 2012-05-30 (×8): qty 1

## 2012-05-30 MED ORDER — HYDROMORPHONE HCL PF 1 MG/ML IJ SOLN: 0.5000 mg | INTRAMUSCULAR | Status: DC | PRN

## 2012-05-30 MED ORDER — SODIUM CHLORIDE 0.9 % IV BOLUS (SEPSIS)
1000.0000 mL | Freq: Once | INTRAVENOUS | Status: AC
  Administered 2012-05-30: 1000 mL via INTRAVENOUS

## 2012-05-30 MED ORDER — IOHEXOL 350 MG/ML SOLN
100.0000 mL | Freq: Once | INTRAVENOUS | Status: AC | PRN
  Administered 2012-05-30: 1 mL via INTRAVENOUS

## 2012-05-30 MED ORDER — LORATADINE 10 MG PO TABS
10.0000 mg | ORAL_TABLET | Freq: Two times a day (BID) | ORAL | Status: DC
  Administered 2012-05-30 – 2012-06-01 (×5): 10 mg via ORAL
  Filled 2012-05-30 (×6): qty 1

## 2012-05-30 MED ORDER — QUETIAPINE FUMARATE 50 MG PO TABS
150.0000 mg | ORAL_TABLET | Freq: Every day | ORAL | Status: DC
  Administered 2012-05-30 – 2012-05-31 (×2): 150 mg via ORAL
  Filled 2012-05-30 (×3): qty 1

## 2012-05-30 MED ORDER — LEVOTHYROXINE SODIUM 200 MCG PO TABS
200.0000 ug | ORAL_TABLET | Freq: Every day | ORAL | Status: DC
  Administered 2012-05-30 – 2012-06-01 (×3): 200 ug via ORAL
  Filled 2012-05-30 (×5): qty 1

## 2012-05-30 MED ORDER — ACETAMINOPHEN 650 MG RE SUPP: 650.0000 mg | Freq: Four times a day (QID) | RECTAL | Status: DC | PRN

## 2012-05-30 MED ORDER — PANTOPRAZOLE SODIUM 40 MG PO TBEC
40.0000 mg | DELAYED_RELEASE_TABLET | Freq: Every day | ORAL | Status: DC
  Administered 2012-05-30 – 2012-06-01 (×3): 40 mg via ORAL
  Filled 2012-05-30 (×3): qty 1

## 2012-05-30 MED ORDER — ALUM & MAG HYDROXIDE-SIMETH 200-200-20 MG/5ML PO SUSP
30.0000 mL | Freq: Four times a day (QID) | ORAL | Status: DC | PRN
  Administered 2012-05-31 – 2012-06-01 (×2): 30 mL via ORAL
  Filled 2012-05-30 (×2): qty 30

## 2012-05-30 MED ORDER — CALCIUM CARBONATE-VITAMIN D 500-200 MG-UNIT PO TABS
1.0000 | ORAL_TABLET | Freq: Two times a day (BID) | ORAL | Status: DC
  Administered 2012-05-30 – 2012-06-01 (×5): 1 via ORAL
  Filled 2012-05-30 (×6): qty 1

## 2012-05-30 MED ORDER — OXYCODONE HCL ER 10 MG PO T12A
30.0000 mg | EXTENDED_RELEASE_TABLET | Freq: Three times a day (TID) | ORAL | Status: DC
  Administered 2012-05-30 – 2012-06-01 (×6): 30 mg via ORAL
  Filled 2012-05-30: qty 3
  Filled 2012-05-30: qty 1
  Filled 2012-05-30: qty 2
  Filled 2012-05-30 (×4): qty 3

## 2012-05-30 MED ORDER — VITAMIN D (ERGOCALCIFEROL) 1.25 MG (50000 UNIT) PO CAPS
50000.0000 [IU] | ORAL_CAPSULE | ORAL | Status: DC
  Administered 2012-05-31: 50000 [IU] via ORAL
  Filled 2012-05-30: qty 1

## 2012-05-30 MED ORDER — FENTANYL CITRATE 0.05 MG/ML IJ SOLN
25.0000 ug | INTRAMUSCULAR | Status: DC | PRN
  Administered 2012-05-30 – 2012-05-31 (×5): 50 ug via INTRAVENOUS
  Filled 2012-05-30 (×5): qty 2

## 2012-05-30 MED ORDER — ZOLPIDEM TARTRATE 5 MG PO TABS: 5.0000 mg | ORAL_TABLET | Freq: Every evening | ORAL | Status: DC | PRN

## 2012-05-30 MED ORDER — POTASSIUM CHLORIDE CRYS ER 20 MEQ PO TBCR
20.0000 meq | EXTENDED_RELEASE_TABLET | Freq: Once | ORAL | Status: AC
  Administered 2012-05-30: 20 meq via ORAL
  Filled 2012-05-30: qty 1

## 2012-05-30 MED ORDER — ENOXAPARIN SODIUM 40 MG/0.4ML ~~LOC~~ SOLN
40.0000 mg | SUBCUTANEOUS | Status: DC
  Administered 2012-05-30 – 2012-06-01 (×3): 40 mg via SUBCUTANEOUS
  Filled 2012-05-30 (×3): qty 0.4

## 2012-05-30 MED ORDER — INSULIN ASPART 100 UNIT/ML ~~LOC~~ SOLN: 0.0000 [IU] | Freq: Every day | SUBCUTANEOUS | Status: DC

## 2012-05-30 MED ORDER — SODIUM CHLORIDE 0.9 % IV SOLN: INTRAVENOUS | Status: DC

## 2012-05-30 MED ORDER — OXYCODONE HCL 5 MG PO TABS
5.0000 mg | ORAL_TABLET | Freq: Three times a day (TID) | ORAL | Status: DC
  Administered 2012-05-30 – 2012-05-31 (×3): 5 mg via ORAL
  Filled 2012-05-30 (×3): qty 1

## 2012-05-30 MED ORDER — ALPRAZOLAM 0.5 MG PO TABS
2.0000 mg | ORAL_TABLET | Freq: Three times a day (TID) | ORAL | Status: DC | PRN
  Administered 2012-05-30 – 2012-05-31 (×3): 2 mg via ORAL
  Filled 2012-05-30 (×3): qty 4

## 2012-05-30 MED ORDER — ONDANSETRON HCL 4 MG/2ML IJ SOLN: 4.0000 mg | Freq: Three times a day (TID) | INTRAMUSCULAR | Status: DC | PRN

## 2012-05-30 MED ORDER — SODIUM CHLORIDE 0.9 % IV SOLN
INTRAVENOUS | Status: DC
  Administered 2012-05-30: 03:00:00 via INTRAVENOUS

## 2012-05-30 MED ORDER — ONDANSETRON HCL 4 MG/2ML IJ SOLN
4.0000 mg | Freq: Four times a day (QID) | INTRAMUSCULAR | Status: DC | PRN
  Administered 2012-05-30: 4 mg via INTRAVENOUS
  Filled 2012-05-30: qty 2

## 2012-05-30 MED ORDER — OXYCODONE-ACETAMINOPHEN 10-325 MG PO TABS: 1.0000 | ORAL_TABLET | Freq: Three times a day (TID) | ORAL | Status: DC

## 2012-05-30 MED ORDER — ONDANSETRON HCL 4 MG PO TABS
4.0000 mg | ORAL_TABLET | Freq: Four times a day (QID) | ORAL | Status: DC | PRN
  Administered 2012-05-30: 4 mg via ORAL
  Filled 2012-05-30: qty 1

## 2012-05-30 MED ORDER — ACETAMINOPHEN 325 MG PO TABS
650.0000 mg | ORAL_TABLET | Freq: Four times a day (QID) | ORAL | Status: DC | PRN
  Administered 2012-05-30: 650 mg via ORAL
  Filled 2012-05-30: qty 2

## 2012-05-30 MED ORDER — CALCIUM CITRATE-VITAMIN D 315-200 MG-UNIT PO TABS: 1.0000 | ORAL_TABLET | Freq: Two times a day (BID) | ORAL | Status: DC

## 2012-05-30 NOTE — Progress Notes (Signed)
Pt made aware that she cannot take her own pills now that she is admitted to the hospital.Valerie Silva

## 2012-05-30 NOTE — Progress Notes (Signed)
Report from Night RN. Chart reviewed together. Handoff complete.Introductions complete. Will continue to monitor and advise attending as needed.   

## 2012-05-30 NOTE — Progress Notes (Signed)
05/30/12 1000  Vitals  BP ! 79/41 mmHg  MAP (mmHg) 51  Md advised of condition. Pt arousable. No c/o dizziness. Will continue to monitor and advise attending as needed.

## 2012-05-30 NOTE — H&P (Addendum)
[facility] History and Physical  Valerie Silva ZOX:[id number] DOB: December 27, 1960 DOA: 05/29/2012  Referring physician:  PCP: [name], MD  Specialists:   Chief Complaint:   HPI: Valerie Silva is a 52 y.o. female who presents to the Ed with complaints of SSCP radiating into her back and both shoulders since 6:45pm.  She describes the pain as crushing and sharp, and constant.  She has had headache and nausea associated with her pain.   She was evaluated in the ED and had a negative initial cardiac workup, and a CTS of the chest was performed to evaluate for a PE and or aortic dissection and was negative for both.   She was referred for medical admission.     Review of Systems: The patient denies anorexia, fever,chills, headaches,  weight loss, vision loss, decreased hearing, hoarseness, syncope, dyspnea on exertion, peripheral edema, balance deficits, hemoptysis, abdominal pain, nausea, vomiting, diarrhea, constipation, melena, hematochezia, severe indigestion/heartburn, hematuria, incontinence, muscle weakness, suspicious skin lesions, transient blindness, difficulty walking, depression, unusual weight change, abnormal bleeding, enlarged lymph nodes, angioedema, and breast masses.    Past Medical History  Diagnosis Date  . Osteoporosis   . Hypothyroidism   . Diabetes mellitus   . Exogenous obesity   . Rhinitis   . Arthritis   . Fibromyalgia   . Somnolence   . Sleep apnea   . Anemia, iron deficiency 04/06/2011  . Pernicious anemia 04/06/2011   Past Surgical History  Procedure Laterality Date  . Gastric bypass    . Cholecystectomy    . Tracheostomy    . Uvulectomy    . Tonsillectomy    . Mandible reconstruction    . Spinal fusion    . Dilation and curettage of uterus    . Cesarean section      Medications:  HOME MEDS: Prior to Admission medications   Medication Sig Start Date End Date Taking? Authorizing Provider  acarbose (PRECOSE) 25 MG tablet Take 25 mg by  mouth 3 (three) times daily as needed (for blood sugar).    Yes Historical Provider, MD  alprazolam Prudy Feeler) 2 MG tablet Take 1 tablet (2 mg total) by mouth 3 (three) times daily as needed for anxiety. anxiety 05/25/12  Yes Josph Macho, MD  Calcium Carbonate-Vitamin D (CALCIUM + D PO) Take 1 tablet by mouth every morning. Has magnesium and zinc in it also.   Yes Historical Provider, MD  diclofenac (FLECTOR) 1.3 % PTCH Place 2 patches onto the skin 2 (two) times daily.    Yes Historical Provider, MD  ergocalciferol (VITAMIN D2) 50000 UNITS capsule Take 50,000 Units by mouth 2 (two) times a week.   Yes Historical Provider, MD  escitalopram (LEXAPRO) 20 MG tablet Take 1 tablet (20 mg total) by mouth daily. 08/02/11  Yes Vassie Loll, MD  levothyroxine (SYNTHROID, LEVOTHROID) 200 MCG tablet Take 200 mcg by mouth daily before breakfast.   Yes Historical Provider, MD  loratadine (CLARITIN) 10 MG tablet Take 10 mg by mouth 2 (two) times daily.   Yes Historical Provider, MD  Methocarbamol (ROBAXIN-750 PO) Take 750 mg by mouth 3 (three) times daily.   Yes Historical Provider, MD  Multiple Vitamins-Minerals (MULTIVITAL PO) Take by mouth every morning.   Yes Historical Provider, MD  omeprazole (PRILOSEC) 40 MG capsule Take 1 capsule (40 mg total) by mouth daily. 08/02/11 08/01/12 Yes Vassie Loll, MD  oxyCODONE-acetaminophen (PERCOCET) 10-325 MG per tablet Take 1 tablet by mouth 3 (three) times daily.  Yes Historical Provider, MD  OXYCONTIN 30 MG T12A Take 30 mg by mouth 3 (three) times daily.  04/03/12  Yes Historical Provider, MD  promethazine (PHENERGAN) 25 MG tablet Take 25 mg by mouth at bedtime as needed. nausea   Yes Historical Provider, MD  QUEtiapine (SEROQUEL) 50 MG tablet Take 150 mg by mouth at bedtime.    Yes Historical Provider, MD  SUMAtriptan (IMITREX) 100 MG tablet Take 100 mg by mouth as needed for migraine.  05/01/12  Yes Historical Provider, MD  topiramate (TOPAMAX) 200 MG tablet Take 200 mg  by mouth at bedtime.     Yes Historical Provider, MD  triamterene-hydrochlorothiazide (MAXZIDE) 75-50 MG per tablet Take 1 tablet by mouth daily.  03/01/12  Yes Historical Provider, MD  valACYclovir (VALTREX) 1000 MG tablet Take 1,000 mg by mouth as needed (for flare ups).  08/16/11   Historical Provider, MD    Allergies:  Allergies  Allergen Reactions  . Iodine Other (See Comments)    Sensitive when used gynecologically (intra-vaginally)  . Iron Itching    Iron taken by mouth causes this reaction the patient has had several iron infusions plus Feraheme that has not caused any side effects  . Sulfonamide Derivatives Other (See Comments)    Sensitive when used intra-vaginally.  . Benazepril Hcl     REACTION: severe cough  . Codeine     REACTION: AMS, feeling of passing out  . Penicillins     REACTION: severe family allergy.  Family physician long ago stated she shouldn't take this med since mother and brother nearly died from anaphylaxis.  . Amitriptyline     Sleep walking  . Lotensin (Benazepril)     cough    Social History:   reports that she has never smoked. She does not have any smokeless tobacco history on file. She reports that she does not drink alcohol. Her drug history is not on file.   Family History:  CAD -in Father, Brothers X 3  DM in Father       HTN- in Brother      Colon Cancer in Brother                  Physical Exam:  GEN: Pleasant  Obese 52 year old Caucasian Female examined  and in no acute distress; cooperative with exam Filed Vitals:   05/29/12 2315 05/29/12 2330 05/29/12 2345 05/30/12 0000  BP: 103/69 100/69 100/65 95/66  Pulse: 70 71 71 70  Temp:      TempSrc:      Resp: 22 19 12 13   SpO2: 95% 91% 92% 92%   Blood pressure 95/66, pulse 70, temperature 97.8 F (36.6 C), temperature source Oral, resp. rate 13, SpO2 92.00%. PSYCH: She is alert and oriented x4; does not appear anxious does not appear depressed; affect is normal HEENT:  Normocephalic and Atraumatic, Mucous membranes pink; PERRLA; EOM intact; Fundi:  Benign;  No scleral icterus, Nares: Patent, Oropharynx: Clear, Fair Dentition, Neck:  FROM, no cervical lymphadenopathy nor thyromegaly or carotid bruit; no JVD; Breasts:: Not examined CHEST WALL: No tenderness CHEST: Normal respiration, clear to auscultation bilaterally HEART: Regular rate and rhythm; no murmurs rubs or gallops BACK: No kyphosis or scoliosis; no CVA tenderness ABDOMEN: Positive Bowel Sounds, Obese, soft non-tender; no masses, no organomegaly.   Rectal Exam: Not done EXTREMITIES: No cyanosis, clubbing or edema; no ulcerations. Genitalia: not examined PULSES: 2+ and symmetric SKIN: Normal hydration no rash or ulceration CNS: Cranial nerves  2-12 grossly intact no focal neurologic deficit   Labs & Imaging Results for orders placed during the hospital encounter of 05/29/12 (from the past 48 hour(s))  CBC WITH DIFFERENTIAL     Status: None   Collection Time    05/29/12  8:41 PM      Result Value Range   WBC 5.1  4.0 - 10.5 K/uL   RBC 4.16  3.87 - 5.11 MIL/uL   Hemoglobin 12.9  12.0 - 15.0 g/dL   HCT 16.1  09.6 - 04.5 %   MCV 92.3  78.0 - 100.0 fL   MCH 31.0  26.0 - 34.0 pg   MCHC 33.6  30.0 - 36.0 g/dL   RDW 40.9  81.1 - 91.4 %   Platelets 154  150 - 400 K/uL   Neutrophils Relative % 60  43 - 77 %   Neutro Abs 3.1  1.7 - 7.7 K/uL   Lymphocytes Relative 32  12 - 46 %   Lymphs Abs 1.7  0.7 - 4.0 K/uL   Monocytes Relative 5  3 - 12 %   Monocytes Absolute 0.3  0.1 - 1.0 K/uL   Eosinophils Relative 2  0 - 5 %   Eosinophils Absolute 0.1  0.0 - 0.7 K/uL   Basophils Relative 1  0 - 1 %   Basophils Absolute 0.0  0.0 - 0.1 K/uL  COMPREHENSIVE METABOLIC PANEL     Status: Abnormal   Collection Time    05/29/12  8:41 PM      Result Value Range   Sodium 140  135 - 145 mEq/L   Potassium 3.3 (*) 3.5 - 5.1 mEq/L   Chloride 106  96 - 112 mEq/L   CO2 19  19 - 32 mEq/L   Glucose, Bld 91  70 - 99  mg/dL   BUN 34 (*) 6 - 23 mg/dL   Creatinine, Ser 7.82  0.50 - 1.10 mg/dL   Calcium 8.4  8.4 - 95.6 mg/dL   Total Protein 6.6  6.0 - 8.3 g/dL   Albumin 3.4 (*) 3.5 - 5.2 g/dL   AST 42 (*) 0 - 37 U/L   ALT 49 (*) 0 - 35 U/L   Alkaline Phosphatase 81  39 - 117 U/L   Total Bilirubin 0.2 (*) 0.3 - 1.2 mg/dL   GFR calc non Af Amer 62 (*) >90 mL/min   GFR calc Af Amer 72 (*) >90 mL/min   Comment:            The eGFR has been calculated     using the CKD EPI equation.     This calculation has not been     validated in all clinical     situations.     eGFR's persistently     <90 mL/min signify     possible Chronic Kidney Disease.  POCT I-STAT TROPONIN I     Status: None   Collection Time    05/29/12  9:00 PM      Result Value Range   Troponin i, poc 0.00  0.00 - 0.08 ng/mL   Comment 3            Comment: Due to the release kinetics of cTnI,     a negative result within the first hours     of the onset of symptoms does not rule out     myocardial infarction with certainty.     If myocardial infarction is still suspected,  repeat the test at appropriate intervals.    Radiological Exams on Admission: Dg Chest 2 View  05/29/2012   *RADIOLOGY REPORT*  Clinical Data: Chest pain.  CHEST - 2 VIEW  Comparison: 07/30/2011  Findings: Heart size and mediastinal contours are normal.  Both lungs are clear.  No evidence of pleural effusion.  No mass or lymphadenopathy identified.  Cervical spine fusion hardware again noted.  IMPRESSION: No active disease.   Original Report Authenticated By: Myles Rosenthal, M.D.   Ct Angio Chest Pe W/cm &/or Wo Cm  05/30/2012   *RADIOLOGY REPORT*  Clinical Data: Chest pain.  CT ANGIOGRAPHY CHEST  Technique:  Multidetector CT imaging of the chest using the standard protocol during bolus administration of intravenous contrast. Multiplanar reconstructed images including MIPs were obtained and reviewed to evaluate the vascular anatomy.  Contrast: 1mL OMNIPAQUE IOHEXOL 350  MG/ML SOLN  Comparison: None  Findings: The chest wall is unremarkable.  No breast masses, supraclavicular or axillary adenopathy.  The thyroid gland is normal.  The bony thorax is intact.  Moderate degenerative changes involving the thoracic spine but no destructive bone lesions. Cervical fusion hardware is noted.  The heart is normal in size.  No pericardial effusion.  No mediastinal or hilar lymphadenopathy.  Small scattered lymph nodes are noted.  The esophagus is grossly normal.  The aorta is normal in caliber.  No dissection.  Coronary artery calcifications are noted.  The pulmonary arterial tree is fairly well opacified.  No definite filling defects to suggest pulmonary emboli.  Examination of the lung parenchyma demonstrates no acute pulmonary findings.  Dependent atelectasis is noted.  No pleural effusion, pulmonary mass or worrisome pulmonary nodule.  The tracheobronchial tree is unremarkable.  The upper abdomen is unremarkable.  There are surgical changes from gastric bypass surgery.  IMPRESSION:  1.  No CT findings for pulmonary embolism. 2.  Normal thoracic aorta. 3.  No significant pulmonary findings. 4.  Coronary artery calcifications.   Original Report Authenticated By: Rudie Meyer, M.D.    EKG: Independently reviewed.   Assessment/Plan Principal Problem:   Chest pain, unspecified Active Problems:   Hypotension, unspecified   Dehydration   Hypokalemia   HYPOTHYROIDISM   1.   Chest Pain-  Cardiac Rule out, Cycle troponins, and pain control PRN, Nitropaste as BP tolerates.   2.   Hypotension- IVFs for volume resuscitation.  Check cortisol level, and TSH.     3.   Dehydration-See #2.    4.   Hypokalemia- Replete K+.    5.   Hypothyroid- continue levothyroxine, and check TSH.         Code Status:  FULL CODE Family Communication:   No Family Present Disposition Plan:  Return to Home on Discharge  Time spent:  7 Minutes  Ron Parker [facility] Pager  [phone]  If 7PM-7AM, please contact night-coverage [email or web address] Password [facility] 05/30/2012, 2:29 AM

## 2012-05-30 NOTE — Progress Notes (Signed)
Pt's blood presssure has been low since she arrived on the floor. NP for [facility] aware.Given a total of 2L NS bolus per order. Pt informed me earlier that her BP is usually in the SBP 80-90. Pt remains alert and oriented but complains of slight dizziness .Valerie Silva

## 2012-05-30 NOTE — Progress Notes (Signed)
[facility] Progress Note Valerie Silva TEAM 1 - Stepdown/ICU TEAM   Valerie Silva ZOX:[id number] DOB: 1960/08/31 DOA: 05/29/2012 PCP: [name], MD  Brief narrative: 52 y.o. female who presented to the Ed with complaints of SSCP radiating into her back and both shoulders. She described the pain as crushing, sharp, and constant. She had headache and nausea associated with her pain. She was evaluated in the ED and had a negative initial cardiac workup, and a CTA of the chest was negative for both PE and aortic dissection.   Assessment/Plan:  Hypotension Review of old/office records suggests baseline SBP is 90-110  Hx of suspected adrenal insufficiency Random cortisol level 1.8 Sept 2013 - pt was started on hydrocortisone at that time - saw Endo as outpt (Dr. Sharl Ma), reportedly had ACTH stim test wch was "normal" and steroid stopped - recheck random cortisol  Chest pain  HYPOTHYROIDISM Recheck TSH  Hypokalemia  Hyperglycemia  History of gastric bypass with iron deficiency and B12 deficiency  Severe anxiety  history of chronic pain   Code Status: FULL Family Communication: no family present at time of exam  Disposition Plan: SDU  Consultants: none  Procedures: none  Antibiotics: none  DVT prophylaxis: lovenox  HPI/Subjective: F/U visit completed  Objective: Blood pressure 79/41, pulse 63, temperature 97.5 F (36.4 C), temperature source Oral, resp. rate 10, height 5\' 9"  (1.753 m), weight 102.8 kg (226 lb 10.1 oz), SpO2 94.00%.  Intake/Output Summary (Last 24 hours) at 05/30/12 1058 Last data filed at 05/30/12 0551  Gross per 24 hour  Intake   2000 ml  Output      0 ml  Net   2000 ml   Exam: F/U exam completed  Data Reviewed: Basic Metabolic Panel:  Recent Labs Lab 05/29/12 2041 05/30/12 0445  NA 140 140  K 3.3* 3.4*  CL 106 109  CO2 19 20  GLUCOSE 91 163*  BUN 34* 27*  CREATININE 1.03 0.93  CALCIUM 8.4 7.6*   Liver Function  Tests:  Recent Labs Lab 05/29/12 2041  AST 42*  ALT 49*  ALKPHOS 81  BILITOT 0.2*  PROT 6.6  ALBUMIN 3.4*   CBC:  Recent Labs Lab 05/29/12 2041 05/30/12 0445  WBC 5.1 3.9*  NEUTROABS 3.1  --   HGB 12.9 11.4*  HCT 38.4 34.7*  MCV 92.3 93.8  PLT 154 121*   Cardiac Enzymes:  Recent Labs Lab 05/30/12 0500  TROPONINI <0.30   BNP (last 3 results)  Recent Labs  07/31/11 0245  PROBNP 206.2*   CBG:  Recent Labs Lab 05/30/12 0836  GLUCAP 83    Recent Results (from the past 240 hour(s))  MRSA PCR SCREENING     Status: None   Collection Time    05/30/12  2:37 AM      Result Value Range Status   MRSA by PCR NEGATIVE  NEGATIVE Final   Comment:            The GeneXpert MRSA Assay (FDA     approved for NASAL specimens     only), is one component of a     comprehensive MRSA colonization     surveillance program. It is not     intended to diagnose MRSA     infection nor to guide or     monitor treatment for     MRSA infections.     Studies:  Recent x-ray studies have been reviewed in detail by the Attending Physician  Scheduled Meds:  Scheduled Meds: . aspirin  324 mg Oral Once  . aspirin  325 mg Oral Daily  . calcium-vitamin D  1 tablet Oral BID  . enoxaparin (LOVENOX) injection  40 mg Subcutaneous Q24H  . escitalopram  20 mg Oral Daily  . insulin aspart  0-5 Units Subcutaneous QHS  . insulin aspart  0-9 Units Subcutaneous TID WC  . levothyroxine  200 mcg Oral QAC breakfast  . loratadine  10 mg Oral BID  . methocarbamol  750 mg Oral TID  . nitroGLYCERIN  0.5 inch Topical Q6H  . OxyCODONE  30 mg Oral TID  . pantoprazole  40 mg Oral Daily  . QUEtiapine  150 mg Oral QHS  . topiramate  200 mg Oral QHS  . triamterene-hydrochlorothiazide  1 tablet Oral Daily  . [START ON 05/31/2012] Vitamin D (Ergocalciferol)  50,000 Units Oral 2 times weekly   Continuous Infusions: . sodium chloride 150 mL/hr at 05/30/12 0901    Time spent on care of this  patient: 25+mins   [facility]  [facility] Office  [phone] Pager - Text Page per Loretha Stapler as per below:  On-Call/Text Page:      [email or web address]      password [email or web address]  If 7PM-7AM, please contact night-coverage [email or web address] Password [facility] 05/30/2012, 10:58 AM   LOS: 1 day

## 2012-05-31 DIAGNOSIS — R0789 Other chest pain: Secondary | ICD-10-CM

## 2012-05-31 DIAGNOSIS — R0609 Other forms of dyspnea: Secondary | ICD-10-CM | POA: Diagnosis present

## 2012-05-31 DIAGNOSIS — E119 Type 2 diabetes mellitus without complications: Secondary | ICD-10-CM

## 2012-05-31 LAB — BASIC METABOLIC PANEL
BUN: 16 mg/dL (ref 6–23)
Calcium: 8 mg/dL — ABNORMAL LOW (ref 8.4–10.5)
Creatinine, Ser: 0.83 mg/dL (ref 0.50–1.10)
GFR calc Af Amer: >90 mL/min (ref >90)
GFR calc non Af Amer: 80 mL/min — ABNORMAL LOW (ref >90)

## 2012-05-31 LAB — CORTISOL: Cortisol, Plasma: 15.3 ug/dL

## 2012-05-31 LAB — CBC
MCHC: 32.4 g/dL (ref 30.0–36.0)
RDW: 14.5 % (ref 11.5–15.5)

## 2012-05-31 MED ORDER — OXYCODONE-ACETAMINOPHEN 5-325 MG PO TABS
1.0000 | ORAL_TABLET | Freq: Four times a day (QID) | ORAL | Status: DC | PRN
  Administered 2012-05-31: 1 via ORAL
  Filled 2012-05-31: qty 1

## 2012-05-31 MED ORDER — OXYCODONE HCL 5 MG PO TABS
5.0000 mg | ORAL_TABLET | Freq: Three times a day (TID) | ORAL | Status: DC
  Administered 2012-05-31: 5 mg via ORAL
  Administered 2012-06-01 (×2): 10 mg via ORAL
  Filled 2012-05-31: qty 1
  Filled 2012-05-31 (×2): qty 2

## 2012-05-31 MED ORDER — TROLAMINE SALICYLATE 10 % EX CREA
TOPICAL_CREAM | Freq: Three times a day (TID) | CUTANEOUS | Status: DC
  Filled 2012-05-31: qty 85

## 2012-05-31 MED ORDER — MUSCLE RUB 10-15 % EX CREA
TOPICAL_CREAM | Freq: Three times a day (TID) | CUTANEOUS | Status: DC
  Administered 2012-05-31 – 2012-06-01 (×4): via TOPICAL
  Filled 2012-05-31: qty 85

## 2012-05-31 MED ORDER — SODIUM CHLORIDE 0.9 % IV BOLUS (SEPSIS)
250.0000 mL | Freq: Once | INTRAVENOUS | Status: AC
Start: 2012-05-31 — End: 2012-05-31
  Administered 2012-05-31: 250 mL via INTRAVENOUS

## 2012-05-31 NOTE — Progress Notes (Addendum)
[facility] Progress Note [place] TEAM 1 - Stepdown/ICU TEAM   BETSI CRESPI ZOX:[id number] DOB: [birth year] DOA: 05/29/2012 PCP: [name], MD  Brief narrative: 52 y.o. female who presented to the Ed with complaints of SSCP radiating into her back and both shoulders. She described the pain as crushing, sharp, and constant. She had headache and nausea associated with her pain. She was evaluated in the ED and had a negative initial cardiac workup, and a CTA of the chest was negative for both PE and aortic dissection.   Assessment/Plan:  Hypotension Review of old/office records suggests baseline SBP is 90-110  Hx of suspected adrenal insufficiency Random cortisol level 1.8 Sept 2013 - pt was started on hydrocortisone at that time - saw Endo as outpt (Dr. Sharl Ma), reportedly had ACTH stim test wch was "normal" and steroid stopped - recheck random cortisol  Chest pain - persistent and reproducible- likely musculoskeletal- will increase Oxycodone, add aspercreme and k pad.   Dyspnea on exertion - repeat ECHO and obtain PFTs  HYPOTHYROIDISM Recheck TSH  Hypokalemia - replaced  Dehydration Resolved- stop IVF  Hyperglycemia -none noted at this time- d/c sliding scale and CBGs  History of gastric bypass with iron deficiency and B12 deficiency  Severe anxiety - cont XAnax  Thrombocytopenia Follow- note that they have been this low in the past (04/06/11)- has a h/o iron deficiency and microcytic anemia  history of chronic pain   Code Status: FULL Family Communication: no family present at time of exam  Disposition Plan: transfer to med/ surg  Consultants: none  Procedures: none  Antibiotics: none  DVT prophylaxis: lovenox  HPI/Subjective: Pt c/o chest pain 5/10 at rest which increases with ambulation. She also c/o dyspnea with exertion with has been worse over the past month. She is exposed to second hand smoke from her son. She has also gained about 50  lbs since this past fall. She also states that in the past when she was heavy, she was on an inhaler that helped her.   Objective: Blood pressure 91/49, pulse 67, temperature 98.2 F (36.8 C), temperature source Oral, resp. rate 14, height 5\' 9"  (1.753 m), weight 102.8 kg (226 lb 10.1 oz), SpO2 96.00%.  Intake/Output Summary (Last 24 hours) at 05/31/12 1548 Last data filed at 05/31/12 1500  Gross per 24 hour  Intake   4570 ml  Output   1600 ml  Net   2970 ml   Exam: F/U exam completed  Data Reviewed: Basic Metabolic Panel:  Recent Labs Lab 05/29/12 2041 05/30/12 0445 05/31/12 0455  NA 140 140 141  K 3.3* 3.4* 3.9  CL 106 109 111  CO2 19 20 21   GLUCOSE 91 163* 78  BUN 34* 27* 16  CREATININE 1.03 0.93 0.83  CALCIUM 8.4 7.6* 8.0*   Liver Function Tests:  Recent Labs Lab 05/29/12 2041  AST 42*  ALT 49*  ALKPHOS 81  BILITOT 0.2*  PROT 6.6  ALBUMIN 3.4*   CBC:  Recent Labs Lab 05/29/12 2041 05/30/12 0445 05/31/12 0455  WBC 5.1 3.9* 4.0  NEUTROABS 3.1  --   --   HGB 12.9 11.4* 11.8*  HCT 38.4 34.7* 36.4  MCV 92.3 93.8 95.0  PLT 154 121* 122*   Cardiac Enzymes:  Recent Labs Lab 05/30/12 0500 05/30/12 1151 05/30/12 1634  TROPONINI <0.30 <0.30 <0.30   BNP (last 3 results)  Recent Labs  07/31/11 0245  PROBNP 206.2*   CBG:  Recent Labs Lab 05/30/12 1214  05/30/12 1713 05/30/12 2236 05/31/12 0741 05/31/12 1214  GLUCAP 89 80 84 81 76    Recent Results (from the past 240 hour(s))  MRSA PCR SCREENING     Status: None   Collection Time    05/30/12  2:37 AM      Result Value Range Status   MRSA by PCR NEGATIVE  NEGATIVE Final   Comment:            The GeneXpert MRSA Assay (FDA     approved for NASAL specimens     only), is one component of a     comprehensive MRSA colonization     surveillance program. It is not     intended to diagnose MRSA     infection nor to guide or     monitor treatment for     MRSA infections.      Studies:  Recent x-ray studies have been reviewed in detail by the Attending Physician  Scheduled Meds:  Scheduled Meds: . aspirin  325 mg Oral Daily  . calcium-vitamin D  1 tablet Oral BID  . enoxaparin (LOVENOX) injection  40 mg Subcutaneous Q24H  . escitalopram  20 mg Oral Daily  . levothyroxine  200 mcg Oral QAC breakfast  . loratadine  10 mg Oral BID  . methocarbamol  750 mg Oral TID  . MUSCLE RUB   Topical TID  . oxyCODONE  5-10 mg Oral Q8H  . OxyCODONE  30 mg Oral Q8H  . pantoprazole  40 mg Oral Daily  . QUEtiapine  150 mg Oral QHS  . topiramate  200 mg Oral QHS  . Vitamin D (Ergocalciferol)  50,000 Units Oral 2 times weekly   Continuous Infusions:    Time spent on care of this patient: 25+mins   [facility]  [facility] Office  [phone] Pager - Text Page per Loretha Stapler as per below:  On-Call/Text Page:      [email or web address]      password [email or web address]  If 7PM-7AM, please contact night-coverage [email or web address] Password [facility] : [facility] 05/31/2012, 3:48 PM   LOS: 2 days    I have examined the patient, reviewed the chart and modified the above note which I agree with.   Hallee Mckenny,MD [phone] 05/31/2012, 3:57 PM

## 2012-06-01 ENCOUNTER — Inpatient Hospital Stay (HOSPITAL_COMMUNITY): Payer: Medicare Other

## 2012-06-01 DIAGNOSIS — I517 Cardiomegaly: Secondary | ICD-10-CM

## 2012-06-01 MED ORDER — ALBUTEROL SULFATE (5 MG/ML) 0.5% IN NEBU
2.5000 mg | INHALATION_SOLUTION | Freq: Once | RESPIRATORY_TRACT | Status: AC
  Administered 2012-06-01: 2.5 mg via RESPIRATORY_TRACT

## 2012-06-01 MED ORDER — SUMATRIPTAN SUCCINATE 100 MG PO TABS
100.0000 mg | ORAL_TABLET | Freq: Once | ORAL | Status: AC
  Administered 2012-06-01: 100 mg via ORAL
  Filled 2012-06-01: qty 1

## 2012-06-01 NOTE — ED Provider Notes (Signed)
I saw and evaluated the patient, reviewed the resident's note and I agree with the findings and plan.  Xandria Gallaga T Jackey Housey, MD 06/01/12 2341 

## 2012-06-01 NOTE — Evaluation (Signed)
Physical Therapy Evaluation Patient Details Name: Valerie Silva MRN: [id number] DOB: September 09, 1960 Today's Date: 06/01/2012 Time: 7829-5621 PT Time Calculation (min): 19 min  PT Assessment / Plan / Recommendation Clinical Impression  52 y.o. female admitted to [facility] for substernal chest pain.  Cardiac workup negative.  She presents with continued pain that wraps around to her mid back.  Her walking, O2 sats and balance are close to her baseline.  She does not need any acute or f/u PT at this time.  PT to sign off.      PT Assessment  Patent does not need any further PT services    Follow Up Recommendations  No PT follow up    Does the patient have the potential to tolerate intense rehabilitation     NA  Barriers to Discharge   None      Equipment Recommendations  None recommended by PT          Precautions / Restrictions none  Pertinent Vitals/Pain   HR 74, O2 sats 98% with gait, no DOE with exertion        Mobility  Bed Mobility Bed Mobility: Supine to Sit;Sitting - Scoot to Edge of Bed;Sit to Supine Supine to Sit: 7: Independent Sitting - Scoot to Edge of Bed: 7: Independent Sit to Supine: 7: Independent Transfers Transfers: Sit to Stand;Stand to Sit Sit to Stand: 7: Independent Stand to Sit: 7: Independent Ambulation/Gait Ambulation/Gait Assistance: 6: Modified independent (Device/Increase time) Ambulation Distance (Feet): 400 Feet Assistive device: None Ambulation/Gait Assistance Details: Only modified independent due to increased time needed and occationally reached for hallway railing for support.   Gait Pattern: Step-through pattern (mildly antalgic) General Gait Details: Pt reports her walking is pretty close to normal except that she is walking a little slower because she doesn't feel good.         Visit Information  Last PT Received On: 06/01/12 Assistance Needed: +1    Subjective Data  Subjective: Pt reports that she had an episode like this three  years ago and her pain went away in two days and her hospital workup was negative for heart attack.  She has significant history of back pain cervical mid throacic and lumbar.   Patient Stated Goal: To figure out where the pain is coming from.     Prior Functioning  Home Living Lives With: Son Available Help at Discharge: Family;Available PRN/intermittently Type of Home: House Home Access: Stairs to enter [employer] of Steps: 2 Entrance Stairs-Rails: Right Home Layout: One level Home Adaptive Equipment: Wheelchair - manual;Straight cane Prior Function Level of Independence: Independent with assistive device(s) Able to Take Stairs?: Yes Vocation: On disability Communication Communication: No difficulties    Cognition  Cognition Arousal/Alertness: Awake/alert Behavior During Therapy: WFL for tasks assessed/performed Overall Cognitive Status: Within Functional Limits for tasks assessed    Extremity/Trunk Assessment Right Lower Extremity Assessment RLE ROM/Strength/Tone: [facility] for tasks assessed Left Lower Extremity Assessment LLE ROM/Strength/Tone: WFL for tasks assessed   Balance Dynamic Sitting Balance Dynamic Sitting - Balance Support: No upper extremity supported;Feet supported Dynamic Sitting - Level of Assistance: 7: Independent Dynamic Standing Balance Dynamic Standing - Balance Support: Right upper extremity supported;Left upper extremity supported;No upper extremity supported Dynamic Standing - Level of Assistance: 6: Modified independent (Device/Increase time)  End of Session PT - End of Session Activity Tolerance: Patient limited by fatigue;Patient limited by pain Patient left: in bed;with call bell/phone within reach    [facility] B. Taeler Winning, PT, DPT [phone]  06/01/2012, 3:42 PM

## 2012-06-01 NOTE — Discharge Summary (Signed)
Physician Discharge Summary  Valerie Silva:[id number] DOB: July 31, 1960 DOA: 05/29/2012  PCP: [name], MD  Admit date: 05/29/2012 Discharge date: 06/01/2012  Time spent: Greater than 30 minutes  Recommendations for Outpatient Follow-up:  -Follow up with PCP in 2 weeks. -Results of 2D ECHO will need to be reviewed as results are pending at time of DC.   Discharge Diagnoses:  Principal Problem:   Chest pain, musculoskeletal Active Problems:   HYPOTHYROIDISM   Hypotension, unspecified   Dehydration   Hypokalemia   Dyspnea on exertion   Discharge Condition: Stable and improved  Filed Weights   05/30/12 0241  Weight: 102.8 kg (226 lb 10.1 oz)    History of present illness:  Patient is a 52 y.o. female who presents to the Ed with complaints of SSCP radiating into her back and both shoulders since 6:45pm. She describes the pain as crushing and sharp, and constant. She has had headache and nausea associated with her pain. She was evaluated in the ED and had a negative initial cardiac workup, and a CTS of the chest was performed to evaluate for a PE and or aortic dissection and was negative for both. She was referred for medical admission.    Hospital Course:   Hypotension  Review of old/office records suggests baseline SBP is 90-110  -BP in the hospital has been in the 80s-110s. -Suspect multiple pain meds./benzos may be contributing to hypotension. -Random cortisol was 15.3. -She has been evaluated by endocrine and had a normal stim test; not believed to have adrenal insufficiency.  Chest pain  -Has ruled out for ACS by troponins and EKGs. ECHO done with results pending at time of DC. - persistent and reproducible- likely musculoskeletal-  HYPOTHYROIDISM  -Continue synthroid  Hypokalemia  - replaced    History of gastric bypass with iron deficiency and B12 deficiency   Severe anxiety  - cont XAnax   history of chronic pain    Procedures:  Echo     Consultations:  None   Discharge Instructions  Discharge Orders   Future Appointments Provider Department Dept Phone   06/05/2012 11:30 AM [facility] Nurse [place] [facility] [phone]   07/03/2012 11:30 AM [facility] Nurse [place] [facility] [phone]   08/06/2012 11:45 AM Sharlotte [place] [facility] [phone]   08/06/2012 12:15 PM Josph Macho, MD [place] [facility] [phone]   08/06/2012 1:00 PM [facility] Nurse [place] [facility] [phone]   Future Orders Complete By Expires     Discontinue IV  As directed     Increase activity slowly  As directed         Medication List    STOP taking these medications       triamterene-hydrochlorothiazide 75-50 MG per tablet  Commonly known as:  MAXZIDE      TAKE these medications       acarbose 25 MG tablet  Commonly known as:  PRECOSE  Take 25 mg by mouth 3 (three) times daily as needed (for blood sugar).     alprazolam 2 MG tablet  Commonly known as:  XANAX  Take 1 tablet (2 mg total) by mouth 3 (three) times daily as needed for anxiety. anxiety     CALCIUM + D PO  Take 1 tablet by mouth every morning. Has magnesium and zinc in it also.     CLARITIN  10 MG tablet  Generic drug:  loratadine  Take 10 mg by mouth 2 (two) times daily.     diclofenac 1.3 % Ptch  Commonly known as:  FLECTOR  Place 2 patches onto the skin 2 (two) times daily.     ergocalciferol 50000 UNITS capsule  Commonly known as:  VITAMIN D2  Take 50,000 Units by mouth 2 (two) times a week.     escitalopram 20 MG tablet  Commonly known as:  LEXAPRO  Take 1 tablet (20 mg total) by mouth daily.     levothyroxine 200 MCG tablet  Commonly known as:  SYNTHROID, LEVOTHROID  Take 200 mcg by mouth daily before breakfast.     MULTIVITAL PO  Take by mouth every morning.     omeprazole 40 MG capsule  Commonly known as:   PRILOSEC  Take 1 capsule (40 mg total) by mouth daily.     oxyCODONE-acetaminophen 10-325 MG per tablet  Commonly known as:  PERCOCET  Take 1 tablet by mouth 3 (three) times daily.     OXYCONTIN 30 MG T12a  Generic drug:  OxyCODONE HCl ER  Take 30 mg by mouth 3 (three) times daily.     promethazine 25 MG tablet  Commonly known as:  PHENERGAN  Take 25 mg by mouth at bedtime as needed. nausea     QUEtiapine 50 MG tablet  Commonly known as:  SEROQUEL  Take 150 mg by mouth at bedtime.     ROBAXIN-750 PO  Take 750 mg by mouth 3 (three) times daily.     SUMAtriptan 100 MG tablet  Commonly known as:  IMITREX  Take 100 mg by mouth as needed for migraine.     topiramate 200 MG tablet  Commonly known as:  TOPAMAX  Take 200 mg by mouth at bedtime.     valACYclovir 1000 MG tablet  Commonly known as:  VALTREX  Take 1,000 mg by mouth as needed (for flare ups).       Allergies  Allergen Reactions  . Iodine Other (See Comments)    Sensitive when used gynecologically (intra-vaginally)  . Iron Itching    Iron taken by mouth causes this reaction the patient has had several iron infusions plus Feraheme that has not caused any side effects  . Sulfonamide Derivatives Other (See Comments)    Sensitive when used intra-vaginally.  . Benazepril Hcl     REACTION: severe cough  . Codeine     REACTION: AMS, feeling of passing out  . Penicillins     REACTION: severe family allergy.  Family physician long ago stated she shouldn't take this med since mother and brother nearly died from anaphylaxis.  . Amitriptyline     Sleep walking  . Lotensin (Benazepril)     cough       Follow-up Information   Follow up with [name], MD. Schedule an appointment as soon as possible for a visit in 2 weeks.   Contact information:   [address] [place] [address] [phone]        The results of significant diagnostics from this hospitalization (including imaging, microbiology,  ancillary and laboratory) are listed below for reference.    Significant Diagnostic Studies: Dg Chest 2 View  05/29/2012   *RADIOLOGY REPORT*  Clinical Data: Chest pain.  CHEST - 2 VIEW  Comparison: 07/30/2011  Findings: Heart size and mediastinal contours are normal.  Both lungs are clear.  No evidence of pleural effusion.  No mass  or lymphadenopathy identified.  Cervical spine fusion hardware again noted.  IMPRESSION: No active disease.   Original Report Authenticated By: Myles Rosenthal, M.D.   Ct Angio Chest Pe W/cm &/or Wo Cm  05/30/2012   *RADIOLOGY REPORT*  Clinical Data: Chest pain.  CT ANGIOGRAPHY CHEST  Technique:  Multidetector CT imaging of the chest using the standard protocol during bolus administration of intravenous contrast. Multiplanar reconstructed images including MIPs were obtained and reviewed to evaluate the vascular anatomy.  Contrast: 1mL OMNIPAQUE IOHEXOL 350 MG/ML SOLN  Comparison: None  Findings: The chest wall is unremarkable.  No breast masses, supraclavicular or axillary adenopathy.  The thyroid gland is normal.  The bony thorax is intact.  Moderate degenerative changes involving the thoracic spine but no destructive bone lesions. Cervical fusion hardware is noted.  The heart is normal in size.  No pericardial effusion.  No mediastinal or hilar lymphadenopathy.  Small scattered lymph nodes are noted.  The esophagus is grossly normal.  The aorta is normal in caliber.  No dissection.  Coronary artery calcifications are noted.  The pulmonary arterial tree is fairly well opacified.  No definite filling defects to suggest pulmonary emboli.  Examination of the lung parenchyma demonstrates no acute pulmonary findings.  Dependent atelectasis is noted.  No pleural effusion, pulmonary mass or worrisome pulmonary nodule.  The tracheobronchial tree is unremarkable.  The upper abdomen is unremarkable.  There are surgical changes from gastric bypass surgery.  IMPRESSION:  1.  No CT findings for  pulmonary embolism. 2.  Normal thoracic aorta. 3.  No significant pulmonary findings. 4.  Coronary artery calcifications.   Original Report Authenticated By: Rudie Meyer, M.D.    Microbiology: Recent Results (from the past 240 hour(s))  MRSA PCR SCREENING     Status: None   Collection Time    05/30/12  2:37 AM      Result Value Range Status   MRSA by PCR NEGATIVE  NEGATIVE Final   Comment:            The GeneXpert MRSA Assay (FDA     approved for NASAL specimens     only), is one component of a     comprehensive MRSA colonization     surveillance program. It is not     intended to diagnose MRSA     infection nor to guide or     monitor treatment for     MRSA infections.     Labs: Basic Metabolic Panel:  Recent Labs Lab 05/29/12 2041 05/30/12 0445 05/31/12 0455  NA 140 140 141  K 3.3* 3.4* 3.9  CL 106 109 111  CO2 19 20 21   GLUCOSE 91 163* 78  BUN 34* 27* 16  CREATININE 1.03 0.93 0.83  CALCIUM 8.4 7.6* 8.0*   Liver Function Tests:  Recent Labs Lab 05/29/12 2041  AST 42*  ALT 49*  ALKPHOS 81  BILITOT 0.2*  PROT 6.6  ALBUMIN 3.4*   No results found for this basename: LIPASE, AMYLASE,  in the last 168 hours No results found for this basename: AMMONIA,  in the last 168 hours CBC:  Recent Labs Lab 05/29/12 2041 05/30/12 0445 05/31/12 0455  WBC 5.1 3.9* 4.0  NEUTROABS 3.1  --   --   HGB 12.9 11.4* 11.8*  HCT 38.4 34.7* 36.4  MCV 92.3 93.8 95.0  PLT 154 121* 122*   Cardiac Enzymes:  Recent Labs Lab 05/30/12 0500 05/30/12 1151 05/30/12 1634  TROPONINI <0.30 <0.30 <0.30  BNP: BNP (last 3 results)  Recent Labs  07/31/11 0245  PROBNP 206.2*   CBG:  Recent Labs Lab 05/30/12 1214 05/30/12 1713 05/30/12 2236 05/31/12 0741 05/31/12 1214  GLUCAP 89 80 84 81 76       Signed:  HERNANDEZ ACOSTA,Phinneas Shakoor  [facility] Pager: [phone] 06/01/2012, 3:56 PM

## 2012-06-01 NOTE — Progress Notes (Signed)
  Echocardiogram 2D Echocardiogram has been performed.  Jorje Guild 06/01/2012, 3:40 PM

## 2012-06-01 NOTE — Progress Notes (Signed)
Patient requested for xanax. Blood pressure at 85/48 (manual), assymptomatic. Held xanax. Notified K.Schorr/NP. Told no new interventions at this time, continue observe patient. Also told to hold xanax.

## 2012-06-05 ENCOUNTER — Ambulatory Visit: Payer: Medicare Other

## 2012-06-05 ENCOUNTER — Telehealth: Payer: Self-pay | Admitting: Hematology & Oncology

## 2012-06-05 NOTE — Telephone Encounter (Signed)
Patient called and cx 06/05/12 apt and stated she will call back later to resch

## 2012-06-11 ENCOUNTER — Ambulatory Visit: Payer: Medicare Other

## 2012-06-22 ENCOUNTER — Other Ambulatory Visit: Payer: Self-pay | Admitting: Hematology & Oncology

## 2012-06-25 ENCOUNTER — Other Ambulatory Visit: Payer: Self-pay | Admitting: Hematology & Oncology

## 2012-07-03 ENCOUNTER — Ambulatory Visit: Payer: Medicare Other

## 2012-07-06 ENCOUNTER — Other Ambulatory Visit: Payer: Self-pay

## 2012-07-06 MED ORDER — QUETIAPINE FUMARATE 50 MG PO TABS: 150.0000 mg | ORAL_TABLET | Freq: Every day | ORAL | Status: DC

## 2012-07-07 ENCOUNTER — Emergency Department (HOSPITAL_COMMUNITY): Payer: Medicare Other

## 2012-07-07 ENCOUNTER — Encounter (HOSPITAL_COMMUNITY): Payer: Self-pay | Admitting: Internal Medicine

## 2012-07-07 ENCOUNTER — Inpatient Hospital Stay (HOSPITAL_COMMUNITY)
Admission: EM | Admit: 2012-07-07 | Discharge: 2012-07-10 | DRG: 917 | Disposition: A | Discharge status: Other Acute Inpatient Hospital | Payer: Medicare Other | Attending: Internal Medicine | Admitting: Internal Medicine

## 2012-07-07 DIAGNOSIS — F418 Other specified anxiety disorders: Secondary | ICD-10-CM

## 2012-07-07 DIAGNOSIS — T424X4A Poisoning by benzodiazepines, undetermined, initial encounter: Principal | ICD-10-CM | POA: Diagnosis present

## 2012-07-07 DIAGNOSIS — M545 Low back pain, unspecified: Secondary | ICD-10-CM | POA: Diagnosis present

## 2012-07-07 DIAGNOSIS — D510 Vitamin B12 deficiency anemia due to intrinsic factor deficiency: Secondary | ICD-10-CM | POA: Diagnosis present

## 2012-07-07 DIAGNOSIS — Z9884 Bariatric surgery status: Secondary | ICD-10-CM

## 2012-07-07 DIAGNOSIS — E119 Type 2 diabetes mellitus without complications: Secondary | ICD-10-CM

## 2012-07-07 DIAGNOSIS — G9340 Encephalopathy, unspecified: Secondary | ICD-10-CM | POA: Diagnosis present

## 2012-07-07 DIAGNOSIS — M129 Arthropathy, unspecified: Secondary | ICD-10-CM | POA: Diagnosis present

## 2012-07-07 DIAGNOSIS — T43502A Poisoning by unspecified antipsychotics and neuroleptics, intentional self-harm, initial encounter: Secondary | ICD-10-CM | POA: Diagnosis present

## 2012-07-07 DIAGNOSIS — D509 Iron deficiency anemia, unspecified: Secondary | ICD-10-CM | POA: Diagnosis present

## 2012-07-07 DIAGNOSIS — F1910 Other psychoactive substance abuse, uncomplicated: Secondary | ICD-10-CM | POA: Diagnosis present

## 2012-07-07 DIAGNOSIS — G4733 Obstructive sleep apnea (adult) (pediatric): Secondary | ICD-10-CM | POA: Diagnosis present

## 2012-07-07 DIAGNOSIS — Z6835 Body mass index (BMI) 35.0-35.9, adult: Secondary | ICD-10-CM

## 2012-07-07 DIAGNOSIS — M810 Age-related osteoporosis without current pathological fracture: Secondary | ICD-10-CM | POA: Diagnosis present

## 2012-07-07 DIAGNOSIS — E162 Hypoglycemia, unspecified: Secondary | ICD-10-CM | POA: Diagnosis present

## 2012-07-07 DIAGNOSIS — T424X2S Poisoning by benzodiazepines, intentional self-harm, sequela: Secondary | ICD-10-CM

## 2012-07-07 DIAGNOSIS — I959 Hypotension, unspecified: Secondary | ICD-10-CM | POA: Diagnosis present

## 2012-07-07 DIAGNOSIS — R21 Rash and other nonspecific skin eruption: Secondary | ICD-10-CM | POA: Diagnosis present

## 2012-07-07 DIAGNOSIS — E039 Hypothyroidism, unspecified: Secondary | ICD-10-CM | POA: Diagnosis present

## 2012-07-07 DIAGNOSIS — M797 Fibromyalgia: Secondary | ICD-10-CM | POA: Diagnosis present

## 2012-07-07 DIAGNOSIS — R0902 Hypoxemia: Secondary | ICD-10-CM | POA: Diagnosis present

## 2012-07-07 DIAGNOSIS — G8929 Other chronic pain: Secondary | ICD-10-CM | POA: Diagnosis present

## 2012-07-07 DIAGNOSIS — T424X2A Poisoning by benzodiazepines, intentional self-harm, initial encounter: Secondary | ICD-10-CM

## 2012-07-07 DIAGNOSIS — E669 Obesity, unspecified: Secondary | ICD-10-CM | POA: Diagnosis present

## 2012-07-07 DIAGNOSIS — G4730 Sleep apnea, unspecified: Secondary | ICD-10-CM | POA: Diagnosis present

## 2012-07-07 DIAGNOSIS — F341 Dysthymic disorder: Secondary | ICD-10-CM | POA: Diagnosis present

## 2012-07-07 DIAGNOSIS — E876 Hypokalemia: Secondary | ICD-10-CM | POA: Diagnosis present

## 2012-07-07 LAB — POCT I-STAT 3, ART BLOOD GAS (G3+)
Acid-base deficit: 5 mmol/L — ABNORMAL HIGH (ref 0.0–2.0)
Bicarbonate: 20.2 meq/L (ref 20.0–24.0)
O2 Saturation: 95 %
Patient temperature: 98.6
TCO2: 21 mmol/L (ref 0–100)
pCO2 arterial: 36.5 mmHg (ref 35.0–45.0)
pH, Arterial: 7.351 (ref 7.350–7.450)
pO2, Arterial: 77 mmHg — ABNORMAL LOW (ref 80.0–100.0)

## 2012-07-07 LAB — CBC WITH DIFFERENTIAL/PLATELET
Basophils Absolute: 0 10*3/uL (ref 0.0–0.1)
HCT: 36.5 % (ref 36.0–46.0)
Lymphs Abs: 0.8 10*3/uL (ref 0.7–4.0)
MCH: 30.7 pg (ref 26.0–34.0)
MCHC: 33.7 g/dL (ref 30.0–36.0)
MCV: 91 fL (ref 78.0–100.0)
Monocytes Absolute: 0.5 10*3/uL (ref 0.1–1.0)
Neutro Abs: 4.2 10*3/uL (ref 1.7–7.7)
Platelets: 141 10*3/uL — ABNORMAL LOW (ref 150–400)
RDW: 12.7 % (ref 11.5–15.5)

## 2012-07-07 LAB — LACTIC ACID, PLASMA: Lactic Acid, Venous: 1.2 mmol/L (ref 0.5–2.2)

## 2012-07-07 LAB — COMPREHENSIVE METABOLIC PANEL WITH GFR
ALT: 33 U/L (ref 0–35)
AST: 73 U/L — ABNORMAL HIGH (ref 0–37)
Albumin: 3.2 g/dL — ABNORMAL LOW (ref 3.5–5.2)
Alkaline Phosphatase: 76 U/L (ref 39–117)
BUN: 21 mg/dL (ref 6–23)
CO2: 21 meq/L (ref 19–32)
Calcium: 8 mg/dL — ABNORMAL LOW (ref 8.4–10.5)
Chloride: 106 meq/L (ref 96–112)
Creatinine, Ser: 0.78 mg/dL (ref 0.50–1.10)
GFR calc Af Amer: >90 mL/min
GFR calc non Af Amer: >90 mL/min
Glucose, Bld: 132 mg/dL — ABNORMAL HIGH (ref 70–99)
Potassium: 2.7 meq/L — CL (ref 3.5–5.1)
Sodium: 138 meq/L (ref 135–145)
Total Bilirubin: 0.5 mg/dL (ref 0.3–1.2)
Total Protein: 6.2 g/dL (ref 6.0–8.3)

## 2012-07-07 LAB — URINALYSIS, ROUTINE W REFLEX MICROSCOPIC
Bilirubin Urine: NEGATIVE
Hgb urine dipstick: NEGATIVE
Nitrite: NEGATIVE
Specific Gravity, Urine: >1.03 — ABNORMAL HIGH (ref 1.005–1.030)
Urobilinogen, UA: 1 mg/dL (ref 0.0–1.0)
pH: 6 (ref 5.0–8.0)

## 2012-07-07 LAB — RAPID URINE DRUG SCREEN, HOSP PERFORMED
Amphetamines: NOT DETECTED
Benzodiazepines: POSITIVE — AB
Cocaine: NOT DETECTED
Opiates: NOT DETECTED
Tetrahydrocannabinol: NOT DETECTED

## 2012-07-07 LAB — POCT I-STAT TROPONIN I

## 2012-07-07 LAB — ETHANOL: Alcohol, Ethyl (B): <11 mg/dL (ref 0–11)

## 2012-07-07 LAB — PROTEIN AND GLUCOSE, CSF: Total  Protein, CSF: 42 mg/dL (ref 15–45)

## 2012-07-07 MED ORDER — ENOXAPARIN SODIUM 40 MG/0.4ML ~~LOC~~ SOLN
40.0000 mg | Freq: Every day | SUBCUTANEOUS | Status: DC
  Administered 2012-07-08 – 2012-07-10 (×3): 40 mg via SUBCUTANEOUS
  Filled 2012-07-07 (×3): qty 0.4

## 2012-07-07 MED ORDER — DEXTROSE 5 % IV SOLN
2.0000 g | Freq: Once | INTRAVENOUS | Status: AC
  Administered 2012-07-07: 2 g via INTRAVENOUS
  Filled 2012-07-07: qty 2

## 2012-07-07 MED ORDER — NALOXONE HCL 0.4 MG/ML IJ SOLN
0.4000 mg | Freq: Once | INTRAMUSCULAR | Status: AC
  Administered 2012-07-07: 0.4 mg via INTRAVENOUS
  Filled 2012-07-07: qty 1

## 2012-07-07 MED ORDER — SODIUM CHLORIDE 0.9 % IV BOLUS (SEPSIS)
1000.0000 mL | Freq: Once | INTRAVENOUS | Status: AC
  Administered 2012-07-07: 1000 mL via INTRAVENOUS

## 2012-07-07 MED ORDER — ONDANSETRON HCL 4 MG PO TABS: 4.0000 mg | ORAL_TABLET | Freq: Four times a day (QID) | ORAL | Status: DC | PRN

## 2012-07-07 MED ORDER — ACETAMINOPHEN 650 MG RE SUPP: 650.0000 mg | Freq: Four times a day (QID) | RECTAL | Status: DC | PRN

## 2012-07-07 MED ORDER — PANTOPRAZOLE SODIUM 40 MG IV SOLR
40.0000 mg | INTRAVENOUS | Status: DC
  Administered 2012-07-08 – 2012-07-09 (×2): 40 mg via INTRAVENOUS
  Filled 2012-07-07 (×3): qty 40

## 2012-07-07 MED ORDER — POTASSIUM CHLORIDE 10 MEQ/100ML IV SOLN
10.0000 meq | INTRAVENOUS | Status: AC
  Administered 2012-07-07 – 2012-07-08 (×4): 10 meq via INTRAVENOUS
  Filled 2012-07-07 (×4): qty 100

## 2012-07-07 MED ORDER — VANCOMYCIN HCL IN DEXTROSE 1-5 GM/200ML-% IV SOLN
1000.0000 mg | Freq: Once | INTRAVENOUS | Status: AC
  Administered 2012-07-07: 1000 mg via INTRAVENOUS
  Filled 2012-07-07: qty 200

## 2012-07-07 MED ORDER — HYDROMORPHONE HCL PF 1 MG/ML IJ SOLN
0.5000 mg | INTRAMUSCULAR | Status: DC | PRN
  Administered 2012-07-08 – 2012-07-09 (×3): 1 mg via INTRAVENOUS
  Filled 2012-07-07 (×3): qty 1

## 2012-07-07 MED ORDER — DEXTROSE 5 % IV SOLN
700.0000 mg | Freq: Once | INTRAVENOUS | Status: AC
  Administered 2012-07-07: 700 mg via INTRAVENOUS
  Filled 2012-07-07: qty 14

## 2012-07-07 MED ORDER — LORAZEPAM 2 MG/ML IJ SOLN
1.0000 mg | Freq: Once | INTRAMUSCULAR | Status: AC
  Administered 2012-07-07: 1 mg via INTRAVENOUS

## 2012-07-07 MED ORDER — ACETAMINOPHEN 325 MG PO TABS
650.0000 mg | ORAL_TABLET | Freq: Four times a day (QID) | ORAL | Status: DC | PRN
  Administered 2012-07-08: 650 mg via ORAL
  Filled 2012-07-07: qty 2

## 2012-07-07 MED ORDER — SODIUM CHLORIDE 0.9 % IJ SOLN
3.0000 mL | Freq: Two times a day (BID) | INTRAMUSCULAR | Status: DC
  Administered 2012-07-07 – 2012-07-10 (×3): 3 mL via INTRAVENOUS

## 2012-07-07 MED ORDER — POTASSIUM CHLORIDE IN NACL 20-0.9 MEQ/L-% IV SOLN
INTRAVENOUS | Status: DC
  Administered 2012-07-08 (×2): via INTRAVENOUS
  Filled 2012-07-07 (×8): qty 1000

## 2012-07-07 MED ORDER — ALUM & MAG HYDROXIDE-SIMETH 200-200-20 MG/5ML PO SUSP: 30.0000 mL | Freq: Four times a day (QID) | ORAL | Status: DC | PRN

## 2012-07-07 MED ORDER — ONDANSETRON HCL 4 MG/2ML IJ SOLN: 4.0000 mg | Freq: Four times a day (QID) | INTRAMUSCULAR | Status: DC | PRN

## 2012-07-07 MED ORDER — DEXTROSE 5 % IV SOLN
2.0000 g | INTRAVENOUS | Status: DC
  Filled 2012-07-07: qty 2

## 2012-07-07 MED ORDER — LORAZEPAM 2 MG/ML IJ SOLN
1.0000 mg | Freq: Once | INTRAMUSCULAR | Status: AC
  Administered 2012-07-07: 1 mg via INTRAVENOUS
  Filled 2012-07-07: qty 1

## 2012-07-07 MED ORDER — OXYCODONE HCL 5 MG PO TABS
5.0000 mg | ORAL_TABLET | ORAL | Status: DC | PRN
  Administered 2012-07-08 – 2012-07-10 (×5): 5 mg via ORAL
  Filled 2012-07-07 (×5): qty 1

## 2012-07-07 NOTE — Progress Notes (Signed)
ANTIBIOTIC CONSULT NOTE - INITIAL  Pharmacy Consult for vancomycin, acyclovir Indication: R/o encephalitis  Allergies  Allergen Reactions  . Iodine Other (See Comments)    Sensitive when used gynecologically (intra-vaginally)  . Iron Itching    Iron taken by mouth causes this reaction the patient has had several iron infusions plus Feraheme that has not caused any side effects  . Sulfonamide Derivatives Other (See Comments)    Sensitive when used intra-vaginally.  . Benazepril Hcl     REACTION: severe cough  . Codeine     REACTION: AMS, feeling of passing out  . Penicillins     REACTION: severe family allergy.  Family physician long ago stated she shouldn't take this med since mother and brother nearly died from anaphylaxis.  . Amitriptyline     Sleep walking  . Lotensin (Benazepril)     cough    Patient Measurements: Height: 5\' 9"  (175.3 cm) Weight: 226 lb 10.1 oz (102.8 kg) (Per 05/30/12 documentation) IBW/kg (Calculated) : 66.2  Vital Signs: Temp: 96.8 F (36 C) (06/28 2032) Temp src: Rectal (06/28 2032) BP: 91/76 mmHg (06/28 2230) Pulse Rate: 63 (06/28 2230) Intake/Output from previous day:   Intake/Output from this shift: Total I/O In: 1000 [I.V.:1000] Out: 500 [Urine:500]  Labs:  Recent Labs  07/07/12 2037  WBC 5.6  HGB 12.3  PLT 141*  CREATININE 0.78   Estimated Creatinine Clearance: 106.1 ml/min (by C-G formula based on Cr of 0.78). No results found for this basename: VANCOTROUGH, VANCOPEAK, VANCORANDOM, GENTTROUGH, GENTPEAK, GENTRANDOM, TOBRATROUGH, TOBRAPEAK, TOBRARND, AMIKACINPEAK, AMIKACINTROU, AMIKACIN,  in the last 72 hours   Microbiology: No results found for this or any previous visit (from the past 720 hour(s)).  Medical History: Past Medical History  Diagnosis Date  . Osteoporosis   . Hypothyroidism   . Diabetes mellitus   . Exogenous obesity   . Rhinitis   . Arthritis   . Fibromyalgia   . Somnolence   . Sleep apnea   . Anemia,  iron deficiency 04/06/2011  . Pernicious anemia 04/06/2011    Medications:  Scheduled:  . enoxaparin (LOVENOX) injection  40 mg Subcutaneous Q24H  . pantoprazole (PROTONIX) IV  40 mg Intravenous Q24H  . sodium chloride  3 mL Intravenous Q12H   Assessment: 52 yo female admitted with AMS. Pharmacy to manage vancomycin and acyclovir for r/o encephalitis. Patient with order for vancomycin 1gm x 1 and acyclovir 700mg  x 1 in ED.  Goal of Therapy:  Vancomycin trough level 15-20 mcg/ml  Plan:  1. Vancomycin 1.25gm x 1 (total 2.25gm loading dose), then 1gm IV Q8H. 2. Acyclovir 700mg  IV Q8H.   Emeline Gins 07/07/2012,11:29 PM

## 2012-07-07 NOTE — ED Provider Notes (Addendum)
History    CSN: [id number] Arrival date & time 07/07/12  1804  First MD Initiated Contact with Patient 07/07/12 1845     Chief Complaint  Patient presents with  . Altered Mental Status   (Consider location/radiation/quality/duration/timing/severity/associated sxs/prior Treatment) HPI Comments: Patient is brought in by EMS after being found unresponsive by family. Apparently she was last seen normal at 10:00 last night. They found her at 4:00 this afternoon out of bed. She is unresponsive to verbal stimuli and will open her eyes to verbal stimuli. She has had some oral communication with the nurses and that she told them that she had to go the bathroom. Otherwise she's been nonverbal since arrival. She is on Percocet and Xanax at home as well as Robaxin. She has a history of diabetes and chronic shoulder pain. She's also had gastric bypass surgery and has had related pernicious anemia. She has had a tracheostomy in the past but this is for an unknown reason. Her blood glucose was 117.  Patient is a 52 y.o. female presenting with altered mental status.  Altered Mental Status  Past Medical History  Diagnosis Date  . Osteoporosis   . Hypothyroidism   . Diabetes mellitus   . Exogenous obesity   . Rhinitis   . Arthritis   . Fibromyalgia   . Somnolence   . Sleep apnea   . Anemia, iron deficiency 04/06/2011  . Pernicious anemia 04/06/2011   Past Surgical History  Procedure Laterality Date  . Gastric bypass    . Cholecystectomy    . Tracheostomy    . Uvulectomy    . Tonsillectomy    . Mandible reconstruction    . Spinal fusion    . Dilation and curettage of uterus    . Cesarean section     Family History  Problem Relation Age of Onset  . CAD Father   . CAD Brother      X3  . Diabetes Father   . Hypertension Brother   . Colon cancer Brother    History  Substance Use Topics  . Smoking status: Never Smoker   . Smokeless tobacco: Not on file  . Alcohol Use: No   OB  History   Grav Para Term Preterm Abortions TAB SAB Ect Mult Living                 Review of Systems  Unable to perform ROS: Mental status change  Psychiatric/Behavioral: Positive for altered mental status.    Allergies  Iodine; Iron; Sulfonamide derivatives; Benazepril hcl; Codeine; Penicillins; Amitriptyline; and Lotensin  Home Medications   Current Outpatient Rx  Name  Route  Sig  Dispense  Refill  . acarbose (PRECOSE) 25 MG tablet   Oral   Take 25 mg by mouth 3 (three) times daily as needed (for blood sugar).          Marland Kitchen alprazolam (XANAX) 2 MG tablet      TAKE ONE TABLET BY MOUTH THREE TIMES DAILY AS NEEDED FOR ANXIETY.   90 tablet   0   . Calcium Carbonate-Vitamin D (CALCIUM + D PO)   Oral   Take 1 tablet by mouth every morning. Has magnesium and zinc in it also.         . diclofenac (FLECTOR) 1.3 % PTCH   Transdermal   Place 2 patches onto the skin 2 (two) times daily.          . ergocalciferol (VITAMIN D2) 50000 UNITS capsule  Oral   Take 50,000 Units by mouth 2 (two) times a week.         . escitalopram (LEXAPRO) 20 MG tablet   Oral   Take 1 tablet (20 mg total) by mouth daily.   30 tablet   0   . levothyroxine (SYNTHROID, LEVOTHROID) 200 MCG tablet   Oral   Take 200 mcg by mouth daily before breakfast.         . loratadine (CLARITIN) 10 MG tablet   Oral   Take 10 mg by mouth 2 (two) times daily.         . Methocarbamol (ROBAXIN-750 PO)   Oral   Take 750 mg by mouth 3 (three) times daily.         . Multiple Vitamins-Minerals (MULTIVITAL PO)   Oral   Take by mouth every morning.         Marland Kitchen omeprazole (PRILOSEC) 40 MG capsule   Oral   Take 1 capsule (40 mg total) by mouth daily.   30 capsule   1   . oxyCODONE-acetaminophen (PERCOCET) 10-325 MG per tablet   Oral   Take 1 tablet by mouth 3 (three) times daily.         . OXYCONTIN 30 MG T12A   Oral   Take 30 mg by mouth 3 (three) times daily.          . promethazine  (PHENERGAN) 25 MG tablet   Oral   Take 25 mg by mouth at bedtime as needed. nausea         . QUEtiapine (SEROQUEL) 50 MG tablet   Oral   Take 3 tablets (150 mg total) by mouth at bedtime.   90 tablet   3   . SUMAtriptan (IMITREX) 100 MG tablet   Oral   Take 100 mg by mouth as needed for migraine.          . topiramate (TOPAMAX) 200 MG tablet   Oral   Take 200 mg by mouth at bedtime.           . valACYclovir (VALTREX) 1000 MG tablet   Oral   Take 1,000 mg by mouth as needed (for flare ups).           BP 91/76  Pulse 63  Temp(Src) 96.8 F (36 C) (Rectal)  Resp 17  SpO2 98% Physical Exam  Constitutional: She appears well-developed and well-nourished.  Patient is lying with eyes closed breathing with her mouth open. She will open her eyes to verbal stimuli  HENT:  Head: Normocephalic and atraumatic.  Eyes: Pupils are equal, round, and reactive to light.  Pupils midpoint  Neck: Normal range of motion. Neck supple.  Cardiovascular: Normal rate, regular rhythm and normal heart sounds.   Pulmonary/Chest: Effort normal and breath sounds normal. No respiratory distress. She has no wheezes. She has no rales. She exhibits no tenderness.  Abdominal: Soft. Bowel sounds are normal. There is no tenderness. There is no rebound and no guarding.  Musculoskeletal: Normal range of motion. She exhibits no edema.  Lymphadenopathy:    She has no cervical adenopathy.  Neurological:  Patient is lying in bed with her eyes closed but will open her eyes with verbal stimuli or painful stimulation. She moves all show knees symmetrically but will not follow commands. I don't see any obvious facial drooping but she has her mouth open. She has downgoing toes on Babinski bilaterally  Skin: Skin is warm and dry. No rash noted.  Psychiatric: She has a normal mood and affect.    ED Course  LUMBAR PUNCTURE Date/Time: 07/07/2012 10:55 PM Performed by: Doneta Bayman Authorized by: Rolan Bucco Consent: The procedure was performed in an emergent situation. Time out: Immediately prior to procedure a "time out" was called to verify the correct patient, procedure, equipment, support staff and site/side marked as required. Indications: evaluation for altered mental status and evaluation for infection Anesthesia: local infiltration Local anesthetic: lidocaine 1% without epinephrine Anesthetic total: 2 ml Patient sedated: yes Sedation type: anxiolysis Sedatives: lorazepam Sedation start date/time: 07/07/2012 10:15 PM Sedation end date/time: 07/07/2012 10:30 PM Vitals: Vital signs were monitored during sedation. Preparation: Patient was prepped and draped in the usual sterile fashion. Lumbar space: L4-L5 interspace Patient's position: right lateral decubitus Needle gauge: 20 Needle type: diamond point Needle length: 3.5 in Number of attempts: 4 Fluid appearance: clear Tubes of fluid: 4 Total volume: 4 ml Post-procedure: site cleaned and adhesive bandage applied Patient tolerance: Patient tolerated the procedure well with no immediate complications.   (including critical care time) Results for orders placed during the hospital encounter of 07/07/12  GLUCOSE, CAPILLARY      Result Value Range   Glucose-Capillary 117 (*) 70 - 99 mg/dL  CBC WITH DIFFERENTIAL      Result Value Range   WBC 5.6  4.0 - 10.5 K/uL   RBC 4.01  3.87 - 5.11 MIL/uL   Hemoglobin 12.3  12.0 - 15.0 g/dL   HCT 16.1  09.6 - 04.5 %   MCV 91.0  78.0 - 100.0 fL   MCH 30.7  26.0 - 34.0 pg   MCHC 33.7  30.0 - 36.0 g/dL   RDW 40.9  81.1 - 91.4 %   Platelets 141 (*) 150 - 400 K/uL   Neutrophils Relative % 75  43 - 77 %   Lymphocytes Relative 14  12 - 46 %   Monocytes Relative 9  3 - 12 %   Eosinophils Relative 2  0 - 5 %   Basophils Relative 0  0 - 1 %   Neutro Abs 4.2  1.7 - 7.7 K/uL   Lymphs Abs 0.8  0.7 - 4.0 K/uL   Monocytes Absolute 0.5  0.1 - 1.0 K/uL   Eosinophils Absolute 0.1  0.0 - 0.7 K/uL    Basophils Absolute 0.0  0.0 - 0.1 K/uL  COMPREHENSIVE METABOLIC PANEL      Result Value Range   Sodium 138  135 - 145 mEq/L   Potassium 2.7 (*) 3.5 - 5.1 mEq/L   Chloride 106  96 - 112 mEq/L   CO2 21  19 - 32 mEq/L   Glucose, Bld 132 (*) 70 - 99 mg/dL   BUN 21  6 - 23 mg/dL   Creatinine, Ser 7.82  0.50 - 1.10 mg/dL   Calcium 8.0 (*) 8.4 - 10.5 mg/dL   Total Protein 6.2  6.0 - 8.3 g/dL   Albumin 3.2 (*) 3.5 - 5.2 g/dL   AST 73 (*) 0 - 37 U/L   ALT 33  0 - 35 U/L   Alkaline Phosphatase 76  39 - 117 U/L   Total Bilirubin 0.5  0.3 - 1.2 mg/dL   GFR calc non Af Amer >90  >90 mL/min   GFR calc Af Amer >90  >90 mL/min  URINALYSIS, ROUTINE W REFLEX MICROSCOPIC      Result Value Range   Color, Urine YELLOW  YELLOW   APPearance CLEAR  CLEAR  Specific Gravity, Urine >1.030 (*) 1.005 - 1.030   pH 6.0  5.0 - 8.0   Glucose, UA NEGATIVE  NEGATIVE mg/dL   Hgb urine dipstick NEGATIVE  NEGATIVE   Bilirubin Urine NEGATIVE  NEGATIVE   Ketones, ur NEGATIVE  NEGATIVE mg/dL   Protein, ur NEGATIVE  NEGATIVE mg/dL   Urobilinogen, UA 1.0  0.0 - 1.0 mg/dL   Nitrite NEGATIVE  NEGATIVE   Leukocytes, UA NEGATIVE  NEGATIVE  LACTIC ACID, PLASMA      Result Value Range   Lactic Acid, Venous 1.2  0.5 - 2.2 mmol/L  ETHANOL      Result Value Range   Alcohol, Ethyl (B) <11  0 - 11 mg/dL  URINE RAPID DRUG SCREEN (HOSP PERFORMED)      Result Value Range   Opiates NONE DETECTED  NONE DETECTED   Cocaine NONE DETECTED  NONE DETECTED   Benzodiazepines POSITIVE (*) NONE DETECTED   Amphetamines NONE DETECTED  NONE DETECTED   Tetrahydrocannabinol NONE DETECTED  NONE DETECTED   Barbiturates NONE DETECTED  NONE DETECTED  POCT I-STAT TROPONIN I      Result Value Range   Troponin i, poc 0.02  0.00 - 0.08 ng/mL   Comment 3           POCT I-STAT 3, BLOOD GAS (G3+)      Result Value Range   pH, Arterial 7.351  7.350 - 7.450   pCO2 arterial 36.5  35.0 - 45.0 mmHg   pO2, Arterial 77.0 (*) 80.0 - 100.0 mmHg    Bicarbonate 20.2  20.0 - 24.0 mEq/L   TCO2 21  0 - 100 mmol/L   O2 Saturation 95.0     Acid-base deficit 5.0 (*) 0.0 - 2.0 mmol/L   Patient temperature 98.6 F     Collection site RADIAL, ALLEN'S TEST ACCEPTABLE     Drawn by RT     Sample type ARTERIAL     Dg Chest 2 View  07/07/2012   *RADIOLOGY REPORT*  Clinical Data: Hypoxia.  Altered mental status.  CHEST - 2 VIEW  Comparison: 05/29/2012  Findings: The patient has taken a poor inspiration.  Allowing for that, heart size is normal.  There could possibly be some patchy infiltrate in the left lower lobe.  This is difficult to state with certainty based on the poor inspiration.  Upper lungs are clear. No effusions.  No significant bony finding.  IMPRESSION: Poor inspiration.  Question atelectasis or infiltrate in the left lower lobe.   Original Report Authenticated By: Paulina Fusi, M.D.   Ct Head Wo Contrast  07/07/2012   *RADIOLOGY REPORT*  Clinical Data: Altered mental status.  Unresponsive.  CT HEAD WITHOUT CONTRAST  Technique:  Contiguous axial images were obtained from the base of the skull through the vertex without contrast.  Comparison: MRI 09/20/2011.  Findings: There is some motion degradation.  The brain appears within normal limits without evidence of atrophy, old or acute infarction, mass lesion, hemorrhage, hydrocephalus or extra-axial collection.  The calvarium is unremarkable.  Sinuses, middle ears and mastoids are clear.  IMPRESSION: Normal head CT   Original Report Authenticated By: Paulina Fusi, M.D.    Date: 07/07/2012  Rate: 76  Rhythm: normal sinus rhythm  QRS Axis: normal  Intervals: normal  ST/T Wave abnormalities: nonspecific ST/T changes  Conduction Disutrbances:none  Narrative Interpretation:   Old EKG Reviewed: changes notedslight ST depression inferiorly    Dg Chest 2 View  07/07/2012   *RADIOLOGY REPORT*  Clinical Data: Hypoxia.  Altered mental status.  CHEST - 2 VIEW  Comparison: 05/29/2012  Findings: The  patient has taken a poor inspiration.  Allowing for that, heart size is normal.  There could possibly be some patchy infiltrate in the left lower lobe.  This is difficult to state with certainty based on the poor inspiration.  Upper lungs are clear. No effusions.  No significant bony finding.  IMPRESSION: Poor inspiration.  Question atelectasis or infiltrate in the left lower lobe.   Original Report Authenticated By: Paulina Fusi, M.D.   Ct Head Wo Contrast  07/07/2012   *RADIOLOGY REPORT*  Clinical Data: Altered mental status.  Unresponsive.  CT HEAD WITHOUT CONTRAST  Technique:  Contiguous axial images were obtained from the base of the skull through the vertex without contrast.  Comparison: MRI 09/20/2011.  Findings: There is some motion degradation.  The brain appears within normal limits without evidence of atrophy, old or acute infarction, mass lesion, hemorrhage, hydrocephalus or extra-axial collection.  The calvarium is unremarkable.  Sinuses, middle ears and mastoids are clear.  IMPRESSION: Normal head CT   Original Report Authenticated By: Paulina Fusi, M.D.   1. Encephalopathy acute     MDM  Patient presents with altered mental status. Her head CT is negative for bleed. She does not have any apparent focal deficits. This could be a drug overdose versus encephalitis. She was given some Narcan twice in the first time she did appear to have some improvement in her communication however the second time she did not appear to have any change in behavior. Her pupils are not pinpoint. Lumbar puncture results are still pending. I discussed case with the hospitalist to admit the patient for further evaluation.  She has been controlling her airway and does not appear to need intubation at this point. She was given empiric antibiotics for possible encephalitis  CRITICAL CARE Performed by: Mirel Hundal Total critical care time: 70 Critical care time was exclusive of separately billable procedures and  treating other patients. Critical care was necessary to treat or prevent imminent or life-threatening deterioration. Critical care was time spent personally by me on the following activities: development of treatment plan with patient and/or surrogate as well as nursing, discussions with consultants, evaluation of patient's response to treatment, examination of patient, obtaining history from patient or surrogate, ordering and performing treatments and interventions, ordering and review of laboratory studies, ordering and review of radiographic studies, pulse oximetry and re-evaluation of patient's condition. Pt appears to be encephalopathic with signifcant change in MS.  Airway was closely monitored   Rolan Bucco, MD 07/07/12 9811  Rolan Bucco, MD 07/07/12 2342

## 2012-07-07 NOTE — ED Notes (Signed)
Patient returned from xray and CT.

## 2012-07-07 NOTE — ED Notes (Signed)
Per EMS patient last seen normal last evening at 2200. Family found patient today approx 1600 and thought patient was sleeping then noticed patient had not been out of bed all day. Patient does not follow commands and only reacts to painful stimuli. Patient has chronic shoulder pain and legs jerking intermittently and allergy to penicillin. Percocet and xanax are the only known medication patient takes on regular basis. Patient family reports patient is diabetic. 20 ga IV placed by EMS. CBG 103 and BP 116/86 and patient sats in 70's and patient placed on 4L oxygen with sats of 96%.

## 2012-07-07 NOTE — ED Notes (Signed)
Patient transported to CT and xray 

## 2012-07-07 NOTE — H&P (Addendum)
[facility] History and Physical  JEWELL HAUGHT ZOX:[id number] DOB: [birth year] DOA: 07/07/2012  Referring physician: EDP PCP: [name], MD  Specialists:   Chief Complaint: Decreased Responsiveness  HPI: Valerie Silva is a 52 y.o. female who was found by her family at home on the floor with decreased responsiveness.  She was last seen normal the day before.   She is obtunded and unable to give a history.   She had been found to be hypoxic and was placed on 4 liters of [place] O2 and her O2 Saturation improved to 96%.  A head CT scan was performed and found to be negative for acute findings.  A UDS was performed and found to be positive for Benzodiazepines of which she is prescribed, as well as pain medication.  She was administered Narcan x 2 with only minimal improvement in her alertness.  Then  a Lumbar Puncture was performed by the EDP and she was placed empirically on IV Vancomycin , Rocephin and Acyclovir.      Review of Systems: The patient is Unable to give a history.       Past Medical History  Diagnosis Date  . Osteoporosis   . Hypothyroidism   . Diabetes mellitus   . Exogenous obesity   . Rhinitis   . Arthritis   . Fibromyalgia   . Somnolence   . Sleep apnea   . Anemia, iron deficiency 04/06/2011  . Pernicious anemia 04/06/2011    Past Surgical History  Procedure Laterality Date  . Gastric bypass    . Cholecystectomy    . Tracheostomy    . Uvulectomy    . Tonsillectomy    . Mandible reconstruction    . Spinal fusion    . Dilation and curettage of uterus    . Cesarean section      Prior to Admission medications   Medication Sig Start Date End Date Taking? Authorizing Provider  acarbose (PRECOSE) 25 MG tablet Take 25 mg by mouth 3 (three) times daily as needed (for blood sugar).     Historical Provider, MD  alprazolam (XANAX) 2 MG tablet TAKE ONE TABLET BY MOUTH THREE TIMES DAILY AS NEEDED FOR ANXIETY. 06/25/12   Josph Macho, MD  Calcium  Carbonate-Vitamin D (CALCIUM + D PO) Take 1 tablet by mouth every morning. Has magnesium and zinc in it also.    Historical Provider, MD  diclofenac (FLECTOR) 1.3 % PTCH Place 2 patches onto the skin 2 (two) times daily.     Historical Provider, MD  ergocalciferol (VITAMIN D2) 50000 UNITS capsule Take 50,000 Units by mouth 2 (two) times a week.    Historical Provider, MD  escitalopram (LEXAPRO) 20 MG tablet Take 1 tablet (20 mg total) by mouth daily. 08/02/11   Vassie Loll, MD  levothyroxine (SYNTHROID, LEVOTHROID) 200 MCG tablet Take 200 mcg by mouth daily before breakfast.    Historical Provider, MD  loratadine (CLARITIN) 10 MG tablet Take 10 mg by mouth 2 (two) times daily.    Historical Provider, MD  Methocarbamol (ROBAXIN-750 PO) Take 750 mg by mouth 3 (three) times daily.    Historical Provider, MD  Multiple Vitamins-Minerals (MULTIVITAL PO) Take by mouth every morning.    Historical Provider, MD  omeprazole (PRILOSEC) 40 MG capsule Take 1 capsule (40 mg total) by mouth daily. 08/02/11 08/01/12  Vassie Loll, MD  oxyCODONE-acetaminophen (PERCOCET) 10-325 MG per tablet Take 1 tablet by mouth 3 (three) times daily.    Historical Provider, MD  OXYCONTIN 30 MG T12A Take 30 mg by mouth 3 (three) times daily.  04/03/12   Historical Provider, MD  promethazine (PHENERGAN) 25 MG tablet Take 25 mg by mouth at bedtime as needed. nausea    Historical Provider, MD  QUEtiapine (SEROQUEL) 50 MG tablet Take 3 tablets (150 mg total) by mouth at bedtime. 07/06/12   York Spaniel, MD  SUMAtriptan (IMITREX) 100 MG tablet Take 100 mg by mouth as needed for migraine.  05/01/12   Historical Provider, MD  topiramate (TOPAMAX) 200 MG tablet Take 200 mg by mouth at bedtime.      Historical Provider, MD  valACYclovir (VALTREX) 1000 MG tablet Take 1,000 mg by mouth as needed (for flare ups).  08/16/11   Historical Provider, MD    Allergies  Allergen Reactions  . Iodine Other (See Comments)    Sensitive when used  gynecologically (intra-vaginally)  . Iron Itching    Iron taken by mouth causes this reaction the patient has had several iron infusions plus Feraheme that has not caused any side effects  . Sulfonamide Derivatives Other (See Comments)    Sensitive when used intra-vaginally.  . Benazepril Hcl     REACTION: severe cough  . Codeine     REACTION: AMS, feeling of passing out  . Penicillins     REACTION: severe family allergy.  Family physician long ago stated she shouldn't take this med since mother and brother nearly died from anaphylaxis.  . Amitriptyline     Sleep walking  . Lotensin (Benazepril)     cough    Social History:  reports that she has never smoked. She does not have any smokeless tobacco history on file. She reports that she does not drink alcohol. Her drug history is not on file.     Family History  Problem Relation Age of Onset  . CAD Father   . CAD Brother      X3  . Diabetes Father   . Hypertension Brother   . Colon cancer Brother     (be sure to complete)   Physical Exam:  GEN:  Obtunded  52 y.o. Obese female  examined  and in no acute distress; cooperative with exam Filed Vitals:   07/07/12 2100 07/07/12 2142 07/07/12 2215 07/07/12 2230  BP: 116/78 140/120 119/77 91/76  Pulse: 73 74 70 63  Temp:      TempSrc:      Resp: 18 18 20 17   SpO2: 94%  96% 98%   Blood pressure 91/76, pulse 63, temperature 96.8 F (36 C), temperature source Rectal, resp. rate 17, SpO2 98.00%. PSYCH: She is alert and oriented x 0; Obtunded,  HEENT: Normocephalic and Atraumatic, Mucous membranes pink; PERRLA; EOM intact; Fundi:  Benign;  No scleral icterus, Nares: Patent, Oropharynx: Clear, Edentulous; Neck:  FROM, no cervical lymphadenopathy nor thyromegaly or carotid bruit; no JVD; Breasts:: Not examined CHEST WALL: No tenderness CHEST: Normal respiration, clear to auscultation bilaterally HEART: Regular rate and rhythm; no murmurs rubs or gallops BACK: No kyphosis or  scoliosis; no CVA tenderness ABDOMEN: Positive Bowel Sounds, Obese, soft non-tender; no masses, no organomegaly.   Rectal Exam: Not done EXTREMITIES: No cyanosis, clubbing or edema; no ulcerations. Genitalia: not examined PULSES: 2+ and symmetric SKIN: Normal hydration no rash or ulceration CNS: Obtunded, able to move all 4 Extremeties, Pupils reactive to Light.      Labs on Admission:  Basic Metabolic Panel:  Recent Labs Lab 07/07/12 2037  NA 138  K  2.7*  CL 106  CO2 21  GLUCOSE 132*  BUN 21  CREATININE 0.78  CALCIUM 8.0*   Liver Function Tests:  Recent Labs Lab 07/07/12 2037  AST 73*  ALT 33  ALKPHOS 76  BILITOT 0.5  PROT 6.2  ALBUMIN 3.2*   No results found for this basename: LIPASE, AMYLASE,  in the last 168 hours No results found for this basename: AMMONIA,  in the last 168 hours CBC:  Recent Labs Lab 07/07/12 2037  WBC 5.6  NEUTROABS 4.2  HGB 12.3  HCT 36.5  MCV 91.0  PLT 141*   Cardiac Enzymes: No results found for this basename: CKTOTAL, CKMB, CKMBINDEX, TROPONINI,  in the last 168 hours  BNP (last 3 results)  Recent Labs  07/31/11 0245  PROBNP 206.2*   CBG:  Recent Labs Lab 07/07/12 1843  GLUCAP 117*    Radiological Exams on Admission: Dg Chest 2 View  07/07/2012   *RADIOLOGY REPORT*  Clinical Data: Hypoxia.  Altered mental status.  CHEST - 2 VIEW  Comparison: 05/29/2012  Findings: The patient has taken a poor inspiration.  Allowing for that, heart size is normal.  There could possibly be some patchy infiltrate in the left lower lobe.  This is difficult to state with certainty based on the poor inspiration.  Upper lungs are clear. No effusions.  No significant bony finding.  IMPRESSION: Poor inspiration.  Question atelectasis or infiltrate in the left lower lobe.   Original Report Authenticated By: Paulina Fusi, M.D.   Ct Head Wo Contrast  07/07/2012   *RADIOLOGY REPORT*  Clinical Data: Altered mental status.  Unresponsive.  CT HEAD  WITHOUT CONTRAST  Technique:  Contiguous axial images were obtained from the base of the skull through the vertex without contrast.  Comparison: MRI 09/20/2011.  Findings: There is some motion degradation.  The brain appears within normal limits without evidence of atrophy, old or acute infarction, mass lesion, hemorrhage, hydrocephalus or extra-axial collection.  The calvarium is unremarkable.  Sinuses, middle ears and mastoids are clear.  IMPRESSION: Normal head CT   Original Report Authenticated By: Paulina Fusi, M.D.     Assessment/Plan Principal Problem:   Acute encephalopathy Active Problems:   Hypotension, unspecified   Hypokalemia   HYPOTHYROIDISM   DIABETES MELLITUS   Hypoxia   Hypocalcemia     1.   Acute Encephalopathy- LP done placed empirically on IV Vancomycin, Rocephin and Acyclovir.  Neuro checks, NPO while obtunded.  2.   Hypotension- transient, improved after IVFs.   Monitor  3.   Hypokalemia- Replete K+, check magnesium.    4.   Hypothyroid- check TSH.    5.  DM2-  SSI coverage.    6.  Hypoxia- O2 PRN, and monitor O2 sats.     7. Hypocalcemia-replete Calcium.         ADDENDUM: 07/08/2012 AT 0700 am   Patient awakened and reported to Nursing caring for her in the Stepdown Unit  that she had tried to kill herself by taking " a bunch of her Seroquel and Xanax pills".   She also reports being fearful of her son at home.   Suicide precautions have been ordered and a 1:1 Comptroller and a social work consultation for further evaluation of her home environment, and a Psychiatry consultation will also be requested for evaluation.       Code Status:    FULL CODE Family Communication:    No Family Present Disposition Plan:    TBA  Time spent:  75 Minutes  Ron Parker [facility] Pager [phone]  If 7PM-7AM, please contact night-coverage [email or web address] Password [facility] 07/07/2012, 11:23 PM

## 2012-07-07 NOTE — ED Notes (Signed)
Potassium of 2.7 reported.  MD notified.

## 2012-07-08 ENCOUNTER — Encounter (HOSPITAL_COMMUNITY): Payer: Self-pay | Admitting: *Deleted

## 2012-07-08 DIAGNOSIS — F4323 Adjustment disorder with mixed anxiety and depressed mood: Secondary | ICD-10-CM

## 2012-07-08 LAB — CBC
Hemoglobin: 11.6 g/dL — ABNORMAL LOW (ref 12.0–15.0)
MCV: 91.8 fL (ref 78.0–100.0)
Platelets: 149 10*3/uL — ABNORMAL LOW (ref 150–400)
RBC: 3.8 MIL/uL — ABNORMAL LOW (ref 3.87–5.11)
WBC: 5.4 10*3/uL (ref 4.0–10.5)

## 2012-07-08 LAB — GRAM STAIN

## 2012-07-08 LAB — HEMOGLOBIN A1C
Hgb A1c MFr Bld: 5 % (ref <5.7)
Mean Plasma Glucose: 97 mg/dL (ref <117)

## 2012-07-08 LAB — GLUCOSE, CAPILLARY
Glucose-Capillary: 108 mg/dL — ABNORMAL HIGH (ref 70–99)
Glucose-Capillary: 66 mg/dL — ABNORMAL LOW (ref 70–99)
Glucose-Capillary: 105 mg/dL — ABNORMAL HIGH (ref 70–99)
Glucose-Capillary: 69 mg/dL — ABNORMAL LOW (ref 70–99)
Glucose-Capillary: 112 mg/dL — ABNORMAL HIGH (ref 70–99)

## 2012-07-08 LAB — BASIC METABOLIC PANEL
CO2: 22 mEq/L (ref 19–32)
Calcium: 7.9 mg/dL — ABNORMAL LOW (ref 8.4–10.5)
Chloride: 110 mEq/L (ref 96–112)
Potassium: 3 mEq/L — ABNORMAL LOW (ref 3.5–5.1)
Sodium: 140 mEq/L (ref 135–145)

## 2012-07-08 LAB — CSF CELL COUNT WITH DIFFERENTIAL
RBC Count, CSF: 1 /mm3 — ABNORMAL HIGH
RBC Count, CSF: 6 /mm3 — ABNORMAL HIGH
WBC, CSF: 1 /mm3 (ref 0–5)

## 2012-07-08 MED ORDER — SODIUM CHLORIDE 0.9 % IJ SOLN
INTRAMUSCULAR | Status: AC
  Administered 2012-07-08: 10 mL via INTRAVENOUS
  Filled 2012-07-08: qty 10

## 2012-07-08 MED ORDER — POTASSIUM CHLORIDE CRYS ER 20 MEQ PO TBCR
40.0000 meq | EXTENDED_RELEASE_TABLET | Freq: Two times a day (BID) | ORAL | Status: DC
  Administered 2012-07-08 – 2012-07-10 (×5): 40 meq via ORAL
  Filled 2012-07-08 (×7): qty 2

## 2012-07-08 MED ORDER — CALCIUM GLUCONATE 10 % IV SOLN
1.0000 g | Freq: Once | INTRAVENOUS | Status: AC
  Administered 2012-07-08: 1 g via INTRAVENOUS
  Filled 2012-07-08: qty 10

## 2012-07-08 MED ORDER — VANCOMYCIN HCL IN DEXTROSE 1-5 GM/200ML-% IV SOLN
1000.0000 mg | Freq: Three times a day (TID) | INTRAVENOUS | Status: DC
  Administered 2012-07-08: 1000 mg via INTRAVENOUS
  Filled 2012-07-08 (×2): qty 200

## 2012-07-08 MED ORDER — INSULIN ASPART 100 UNIT/ML ~~LOC~~ SOLN: 0.0000 [IU] | Freq: Three times a day (TID) | SUBCUTANEOUS | Status: DC

## 2012-07-08 MED ORDER — INSULIN ASPART 100 UNIT/ML ~~LOC~~ SOLN: 0.0000 [IU] | SUBCUTANEOUS | Status: DC

## 2012-07-08 MED ORDER — ACYCLOVIR SODIUM 50 MG/ML IV SOLN
700.0000 mg | Freq: Three times a day (TID) | INTRAVENOUS | Status: DC
  Administered 2012-07-08: 700 mg via INTRAVENOUS
  Filled 2012-07-08 (×3): qty 14

## 2012-07-08 MED ORDER — VANCOMYCIN HCL 10 G IV SOLR
1250.0000 mg | Freq: Once | INTRAVENOUS | Status: AC
  Administered 2012-07-08: 1250 mg via INTRAVENOUS
  Filled 2012-07-08: qty 1250
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
## 2012-07-08 NOTE — Progress Notes (Addendum)
Hypoglycemic Event  CBG: 67  Treatment: 15 gram snack  Symptoms: none  Follow-up CBG: Time: 1220 CBG Result:69  Possible Reasons for Event: inadequate po intact, pt states she "suffers from hypoglycemia."   Comments/MD notified: aware-will monitor po intake, and recheck again     Valerie Silva  Remember to initiate Hypoglycemia Order Set & complete

## 2012-07-08 NOTE — Progress Notes (Addendum)
Hypoglycemic Event  CBG: 66  Treatment: 15 gram snack  Symptoms: none   Follow-up CBG: Time: 850 CBG Result: 105  Possible Reasons for Event: npo status   Comments/MD notified: clear liquids started     Rosine Door  Remember to initiate Hypoglycemia Order Set & complete
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
## 2012-07-08 NOTE — Progress Notes (Signed)
Hypoglycemic Event  CBG: 67   Treatment: dinner tray  Symptoms: none   Follow-up CBG: Time:  CBG Result  Possible Reasons for Event:   Comments/MD notified: on-going issue. Will continue to monitor.    Rosine Door  Remember to initiate Hypoglycemia Order Set & complete
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
## 2012-07-09 DIAGNOSIS — T424X2A Poisoning by benzodiazepines, intentional self-harm, initial encounter: Secondary | ICD-10-CM

## 2012-07-09 LAB — GLUCOSE, CAPILLARY
Glucose-Capillary: 118 mg/dL — ABNORMAL HIGH (ref 70–99)
Glucose-Capillary: 92 mg/dL (ref 70–99)

## 2012-07-09 LAB — URINE CULTURE

## 2012-07-09 LAB — COMPREHENSIVE METABOLIC PANEL
AST: 55 U/L — ABNORMAL HIGH (ref 0–37)
Albumin: 2.9 g/dL — ABNORMAL LOW (ref 3.5–5.2)
Alkaline Phosphatase: 75 U/L (ref 39–117)
Chloride: 108 mEq/L (ref 96–112)
Potassium: 4 mEq/L (ref 3.5–5.1)
Total Bilirubin: 0.3 mg/dL (ref 0.3–1.2)
Total Protein: 5.8 g/dL — ABNORMAL LOW (ref 6.0–8.3)

## 2012-07-09 LAB — CBC
MCHC: 33.7 g/dL (ref 30.0–36.0)
Platelets: 148 10*3/uL — ABNORMAL LOW (ref 150–400)
RDW: 12.9 % (ref 11.5–15.5)
WBC: 5.4 10*3/uL (ref 4.0–10.5)

## 2012-07-09 MED ORDER — HYDROCORTISONE 1 % EX CREA: TOPICAL_CREAM | Freq: Two times a day (BID) | CUTANEOUS | Status: DC

## 2012-07-09 MED ORDER — PROMETHAZINE HCL 25 MG PO TABS: 25.0000 mg | ORAL_TABLET | Freq: Every evening | ORAL | Status: DC | PRN

## 2012-07-09 MED ORDER — PANTOPRAZOLE SODIUM 40 MG PO TBEC: 40.0000 mg | DELAYED_RELEASE_TABLET | Freq: Every day | ORAL | Status: DC

## 2012-07-09 MED ORDER — OXYCODONE HCL ER 30 MG PO T12A: 30.0000 mg | EXTENDED_RELEASE_TABLET | Freq: Two times a day (BID) | ORAL | Status: DC

## 2012-07-09 MED ORDER — LEVOTHYROXINE SODIUM 200 MCG PO TABS
200.0000 ug | ORAL_TABLET | Freq: Every day | ORAL | Status: DC
  Administered 2012-07-09 – 2012-07-10 (×2): 200 ug via ORAL
  Filled 2012-07-09 (×4): qty 1

## 2012-07-09 MED ORDER — IBUPROFEN 200 MG PO TABS
200.0000 mg | ORAL_TABLET | Freq: Three times a day (TID) | ORAL | Status: DC | PRN
  Filled 2012-07-09: qty 1

## 2012-07-09 MED ORDER — HYDROCORTISONE 1 % EX CREA
TOPICAL_CREAM | Freq: Two times a day (BID) | CUTANEOUS | Status: DC
  Administered 2012-07-09 (×2): via TOPICAL
  Administered 2012-07-10: 1 via TOPICAL
  Filled 2012-07-09: qty 28

## 2012-07-09 MED ORDER — PANTOPRAZOLE SODIUM 40 MG PO TBEC
40.0000 mg | DELAYED_RELEASE_TABLET | Freq: Every day | ORAL | Status: DC
  Administered 2012-07-09 – 2012-07-10 (×2): 40 mg via ORAL
  Filled 2012-07-09 (×2): qty 1

## 2012-07-09 MED ORDER — OXYCODONE-ACETAMINOPHEN 5-325 MG PO TABS
1.0000 | ORAL_TABLET | Freq: Three times a day (TID) | ORAL | Status: DC | PRN
  Administered 2012-07-10: 1 via ORAL
  Filled 2012-07-09: qty 1

## 2012-07-09 MED ORDER — ERGOCALCIFEROL 1.25 MG (50000 UT) PO CAPS: 50000.0000 [IU] | ORAL_CAPSULE | ORAL | Status: DC

## 2012-07-09 MED ORDER — METHOCARBAMOL 750 MG PO TABS
750.0000 mg | ORAL_TABLET | Freq: Three times a day (TID) | ORAL | Status: DC | PRN
  Administered 2012-07-09 – 2012-07-10 (×2): 750 mg via ORAL
  Filled 2012-07-09 (×2): qty 1

## 2012-07-09 MED ORDER — DICLOFENAC EPOLAMINE 1.3 % TD PTCH
2.0000 | MEDICATED_PATCH | Freq: Two times a day (BID) | TRANSDERMAL | Status: DC
  Administered 2012-07-09 – 2012-07-10 (×2): 2 via TRANSDERMAL
  Filled 2012-07-09 (×4): qty 2

## 2012-07-09 MED ORDER — OXYCODONE HCL ER 15 MG PO T12A
30.0000 mg | EXTENDED_RELEASE_TABLET | Freq: Three times a day (TID) | ORAL | Status: DC
  Administered 2012-07-09 – 2012-07-10 (×3): 30 mg via ORAL
  Filled 2012-07-09 (×3): qty 2

## 2012-07-09 MED ORDER — CALCIUM CARBONATE-VITAMIN D 500-200 MG-UNIT PO TABS
1.0000 | ORAL_TABLET | Freq: Every day | ORAL | Status: DC
  Administered 2012-07-09 – 2012-07-10 (×2): 1 via ORAL
  Filled 2012-07-09 (×3): qty 1

## 2012-07-09 MED ORDER — OXYCODONE HCL ER 30 MG PO T12A: 30.0000 mg | EXTENDED_RELEASE_TABLET | Freq: Three times a day (TID) | ORAL | Status: DC

## 2012-07-09 MED ORDER — OXYCODONE HCL 5 MG PO TABS: 5.0000 mg | ORAL_TABLET | Freq: Three times a day (TID) | ORAL | Status: DC | PRN

## 2012-07-09 MED ORDER — VITAMIN D (ERGOCALCIFEROL) 1.25 MG (50000 UNIT) PO CAPS
50000.0000 [IU] | ORAL_CAPSULE | ORAL | Status: DC
  Administered 2012-07-09: 50000 [IU] via ORAL
  Filled 2012-07-09: qty 1

## 2012-07-09 MED ORDER — IBUPROFEN 800 MG PO TABS: 400.0000 mg | ORAL_TABLET | Freq: Three times a day (TID) | ORAL | Status: DC | PRN

## 2012-07-09 MED ORDER — CALCIUM CARBONATE-VITAMIN D 250-125 MG-UNIT PO TABS: 1.0000 | ORAL_TABLET | Freq: Every day | ORAL | Status: DC

## 2012-07-09 MED ORDER — LORATADINE 10 MG PO TABS
10.0000 mg | ORAL_TABLET | Freq: Every day | ORAL | Status: DC
  Administered 2012-07-09 – 2012-07-10 (×2): 10 mg via ORAL
  Filled 2012-07-09 (×2): qty 1

## 2012-07-09 MED ORDER — VALACYCLOVIR HCL 500 MG PO TABS: 1000.0000 mg | ORAL_TABLET | ORAL | Status: DC | PRN

## 2012-07-09 MED ORDER — TOPIRAMATE 100 MG PO TABS
200.0000 mg | ORAL_TABLET | Freq: Every day | ORAL | Status: DC
  Administered 2012-07-09: 200 mg via ORAL
  Filled 2012-07-09 (×2): qty 2

## 2012-07-09 MED ORDER — SUMATRIPTAN SUCCINATE 100 MG PO TABS
100.0000 mg | ORAL_TABLET | ORAL | Status: DC | PRN
  Administered 2012-07-09 – 2012-07-10 (×3): 100 mg via ORAL
  Filled 2012-07-09 (×2): qty 1

## 2012-07-09 MED ORDER — OXYCODONE-ACETAMINOPHEN 10-325 MG PO TABS: 1.0000 | ORAL_TABLET | Freq: Three times a day (TID) | ORAL | Status: DC

## 2012-07-09 MED ORDER — OXYCODONE HCL 5 MG PO TABS: 5.0000 mg | ORAL_TABLET | ORAL | Status: DC | PRN

## 2012-07-09 NOTE — Progress Notes (Signed)
Clinical Social Work  CSW reviewed chart which stated psych MD recommends inpatient treatment. CSW faxed referral to [facility] and [facility] will call after reviewing referral. CSW will continue to follow.  Unk Lightning, LCSW (Coverage for Vickii Penna)

## 2012-07-09 NOTE — Treatment Plan (Signed)
[facility] is will to accept pt to our services once pt's pain medications can be verified.  This Clinical research associate tried to contact pt's pain clinic but had to leave a message due to "high call volume."  This Clinical research associate also called the [facility] pharmacy to inquire as to whether or not her pain meds had verified with her pharmacy and the pharmacist reported she could not tell if that had been done.  Once pt's pain medication has been verified she will be assigned a bed a [facility].

## 2012-07-09 NOTE — Plan of Care (Signed)
Clarification: Initial preliminary DC summary listed both oxycodone and percocet as pre admit meds- noted the oxycodone was ordered initially to replace Percocet at time of admit and therefore has been dc'd. Also home Oxycontin was BID and not TID and this was corrected. The psych intake RN is also calling her pain clinic to verify her meds pre admit  Junious Silk, ANP

## 2012-07-09 NOTE — Plan of Care (Signed)
This patient is MEDICALLY STABLE to dc to [facility]  Junious Silk, ANP
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
## 2012-07-09 NOTE — BH Assessment (Signed)
[facility] Assessment Progress Note      Received request for transfer to adult unit but there are no appropriate beds at this time. Will run it with an extender when a bed becomes available.

## 2012-07-10 ENCOUNTER — Encounter (HOSPITAL_COMMUNITY): Payer: Self-pay | Admitting: *Deleted

## 2012-07-10 ENCOUNTER — Inpatient Hospital Stay (HOSPITAL_COMMUNITY)
Admission: AD | Admit: 2012-07-10 | Discharge: 2012-07-13 | DRG: 885 | Disposition: A | Discharge status: Home / Self Care | Payer: 59 | Source: Intra-hospital | Attending: Psychiatry | Admitting: Psychiatry

## 2012-07-10 DIAGNOSIS — X838XXS Intentional self-harm by other specified means, sequela: Secondary | ICD-10-CM

## 2012-07-10 DIAGNOSIS — D510 Vitamin B12 deficiency anemia due to intrinsic factor deficiency: Secondary | ICD-10-CM

## 2012-07-10 DIAGNOSIS — E876 Hypokalemia: Secondary | ICD-10-CM

## 2012-07-10 DIAGNOSIS — M810 Age-related osteoporosis without current pathological fracture: Secondary | ICD-10-CM | POA: Diagnosis present

## 2012-07-10 DIAGNOSIS — R0902 Hypoxemia: Secondary | ICD-10-CM

## 2012-07-10 DIAGNOSIS — R0789 Other chest pain: Secondary | ICD-10-CM

## 2012-07-10 DIAGNOSIS — D649 Anemia, unspecified: Secondary | ICD-10-CM

## 2012-07-10 DIAGNOSIS — G9340 Encephalopathy, unspecified: Secondary | ICD-10-CM

## 2012-07-10 DIAGNOSIS — E119 Type 2 diabetes mellitus without complications: Secondary | ICD-10-CM | POA: Diagnosis present

## 2012-07-10 DIAGNOSIS — E039 Hypothyroidism, unspecified: Secondary | ICD-10-CM | POA: Diagnosis present

## 2012-07-10 DIAGNOSIS — E538 Deficiency of other specified B group vitamins: Secondary | ICD-10-CM

## 2012-07-10 DIAGNOSIS — G4730 Sleep apnea, unspecified: Secondary | ICD-10-CM

## 2012-07-10 DIAGNOSIS — T424X2A Poisoning by benzodiazepines, intentional self-harm, initial encounter: Secondary | ICD-10-CM

## 2012-07-10 DIAGNOSIS — J310 Chronic rhinitis: Secondary | ICD-10-CM

## 2012-07-10 DIAGNOSIS — F332 Major depressive disorder, recurrent severe without psychotic features: Principal | ICD-10-CM | POA: Diagnosis present

## 2012-07-10 DIAGNOSIS — T50901S Poisoning by unspecified drugs, medicaments and biological substances, accidental (unintentional), sequela: Secondary | ICD-10-CM

## 2012-07-10 DIAGNOSIS — M129 Arthropathy, unspecified: Secondary | ICD-10-CM

## 2012-07-10 DIAGNOSIS — E871 Hypo-osmolality and hyponatremia: Secondary | ICD-10-CM

## 2012-07-10 DIAGNOSIS — F341 Dysthymic disorder: Secondary | ICD-10-CM

## 2012-07-10 DIAGNOSIS — I959 Hypotension, unspecified: Secondary | ICD-10-CM

## 2012-07-10 DIAGNOSIS — E860 Dehydration: Secondary | ICD-10-CM

## 2012-07-10 DIAGNOSIS — R945 Abnormal results of liver function studies: Secondary | ICD-10-CM

## 2012-07-10 DIAGNOSIS — E162 Hypoglycemia, unspecified: Secondary | ICD-10-CM

## 2012-07-10 DIAGNOSIS — E669 Obesity, unspecified: Secondary | ICD-10-CM

## 2012-07-10 DIAGNOSIS — F418 Other specified anxiety disorders: Secondary | ICD-10-CM

## 2012-07-10 DIAGNOSIS — Z79899 Other long term (current) drug therapy: Secondary | ICD-10-CM

## 2012-07-10 DIAGNOSIS — M7989 Other specified soft tissue disorders: Secondary | ICD-10-CM

## 2012-07-10 DIAGNOSIS — IMO0001 Reserved for inherently not codable concepts without codable children: Secondary | ICD-10-CM | POA: Diagnosis present

## 2012-07-10 DIAGNOSIS — R404 Transient alteration of awareness: Secondary | ICD-10-CM

## 2012-07-10 HISTORY — DX: Major depressive disorder, single episode, unspecified: F32.9

## 2012-07-10 HISTORY — DX: Depression, unspecified: F32.A

## 2012-07-10 HISTORY — DX: Anxiety disorder, unspecified: F41.9

## 2012-07-10 LAB — GLUCOSE, CAPILLARY: Glucose-Capillary: 70 mg/dL (ref 70–99)

## 2012-07-10 MED ORDER — LEVOTHYROXINE SODIUM 200 MCG PO TABS
200.0000 ug | ORAL_TABLET | Freq: Every day | ORAL | Status: DC
  Administered 2012-07-11 – 2012-07-13 (×3): 200 ug via ORAL
  Filled 2012-07-10 (×6): qty 1

## 2012-07-10 MED ORDER — HYDROCORTISONE 1 % EX CREA
TOPICAL_CREAM | Freq: Two times a day (BID) | CUTANEOUS | Status: DC
  Filled 2012-07-10: qty 28

## 2012-07-10 MED ORDER — POTASSIUM CHLORIDE CRYS ER 20 MEQ PO TBCR
40.0000 meq | EXTENDED_RELEASE_TABLET | Freq: Two times a day (BID) | ORAL | Status: DC
  Administered 2012-07-10 – 2012-07-13 (×6): 40 meq via ORAL
  Filled 2012-07-10 (×12): qty 2

## 2012-07-10 MED ORDER — DICLOFENAC EPOLAMINE 1.3 % TD PTCH
2.0000 | MEDICATED_PATCH | Freq: Two times a day (BID) | TRANSDERMAL | Status: DC
  Administered 2012-07-10: 1 via TRANSDERMAL
  Administered 2012-07-11 – 2012-07-13 (×5): 2 via TRANSDERMAL
  Filled 2012-07-10 (×11): qty 2

## 2012-07-10 MED ORDER — ACETAMINOPHEN 325 MG PO TABS: 650.0000 mg | ORAL_TABLET | Freq: Four times a day (QID) | ORAL | Status: DC | PRN

## 2012-07-10 MED ORDER — OXYCODONE HCL ER 10 MG PO T12A
30.0000 mg | EXTENDED_RELEASE_TABLET | Freq: Three times a day (TID) | ORAL | Status: DC
  Administered 2012-07-10 – 2012-07-13 (×9): 30 mg via ORAL
  Filled 2012-07-10 (×9): qty 3

## 2012-07-10 MED ORDER — TRAZODONE HCL 50 MG PO TABS
50.0000 mg | ORAL_TABLET | Freq: Every day | ORAL | Status: DC
  Administered 2012-07-10: 50 mg via ORAL
  Filled 2012-07-10 (×3): qty 1

## 2012-07-10 MED ORDER — ALUM & MAG HYDROXIDE-SIMETH 200-200-20 MG/5ML PO SUSP: 30.0000 mL | ORAL | Status: DC | PRN

## 2012-07-10 MED ORDER — LORATADINE 10 MG PO TABS
10.0000 mg | ORAL_TABLET | Freq: Every day | ORAL | Status: DC
  Administered 2012-07-11 – 2012-07-13 (×3): 10 mg via ORAL
  Filled 2012-07-10 (×6): qty 1

## 2012-07-10 MED ORDER — OXYCODONE-ACETAMINOPHEN 5-325 MG PO TABS
1.0000 | ORAL_TABLET | Freq: Three times a day (TID) | ORAL | Status: DC | PRN
  Administered 2012-07-11 – 2012-07-12 (×5): 1 via ORAL
  Filled 2012-07-10 (×5): qty 1

## 2012-07-10 MED ORDER — VITAMIN D (ERGOCALCIFEROL) 1.25 MG (50000 UNIT) PO CAPS
50000.0000 [IU] | ORAL_CAPSULE | ORAL | Status: DC
  Administered 2012-07-12: 50000 [IU] via ORAL
  Filled 2012-07-10: qty 1

## 2012-07-10 MED ORDER — OXYCODONE HCL 5 MG PO TABS
5.0000 mg | ORAL_TABLET | Freq: Three times a day (TID) | ORAL | Status: DC | PRN
  Administered 2012-07-10 – 2012-07-12 (×6): 5 mg via ORAL
  Filled 2012-07-10 (×6): qty 1

## 2012-07-10 MED ORDER — PANTOPRAZOLE SODIUM 40 MG PO TBEC
40.0000 mg | DELAYED_RELEASE_TABLET | Freq: Every day | ORAL | Status: DC
  Administered 2012-07-11 – 2012-07-13 (×3): 40 mg via ORAL
  Filled 2012-07-10 (×6): qty 1

## 2012-07-10 MED ORDER — MAGNESIUM HYDROXIDE 400 MG/5ML PO SUSP: 30.0000 mL | Freq: Every day | ORAL | Status: DC | PRN

## 2012-07-10 MED ORDER — METHOCARBAMOL 500 MG PO TABS
750.0000 mg | ORAL_TABLET | Freq: Three times a day (TID) | ORAL | Status: DC | PRN
  Administered 2012-07-10 – 2012-07-13 (×6): 750 mg via ORAL
  Filled 2012-07-10: qty 2
  Filled 2012-07-10 (×2): qty 1
  Filled 2012-07-10 (×3): qty 2
  Filled 2012-07-10: qty 1

## 2012-07-10 MED ORDER — CALCIUM CARBONATE-VITAMIN D 500-200 MG-UNIT PO TABS
1.0000 | ORAL_TABLET | Freq: Every day | ORAL | Status: DC
  Administered 2012-07-11 – 2012-07-13 (×3): 1 via ORAL
  Filled 2012-07-10 (×6): qty 1

## 2012-07-10 MED ORDER — TOPIRAMATE 100 MG PO TABS
200.0000 mg | ORAL_TABLET | Freq: Every day | ORAL | Status: DC
  Administered 2012-07-10 – 2012-07-12 (×3): 200 mg via ORAL
  Filled 2012-07-10 (×6): qty 2

## 2012-07-10 NOTE — Clinical Social Work Psych Note (Signed)
Psych CSW received notification that pt was medically cleared and ready to be admitted to [facility].  Psych CSW f/u with [facility].  Per Fannie Knee, pt has been accepted by Dr. Elsie Saas to bed assignment 500-2.  Psych CSW met with pt at bedside to inform of disposition.  Pt is agreeable to [facility] admission.  Pt signed voluntary form.  Psych CSW placed signed form in pt chart to be transported with her to [facility].  Per nurse unit secretary, pt RN in meeting.    Once RN and pt is ready to go to [facility]: - Call security for transportation (a tech will need to accompany pt to [facility]) - Pt will need to take voluntary form with her to [facility]  Vickii Penna, LCSWA [phone]  Clinical Social Work

## 2012-07-10 NOTE — Progress Notes (Signed)
Utilization review completed. Phebe Dettmer, RN, BSN. 

## 2012-07-10 NOTE — Progress Notes (Signed)
Patient will be transferring to [facility] per MD , Report called to Erskine Squibb, RN. Patient will be transported with security and sitter.

## 2012-07-10 NOTE — Progress Notes (Signed)
[facility] Progress Note [place] TEAM 1 - Stepdown/ICU TEAM   Valerie Silva ZOX:[id number] DOB: 04/14/1960 DOA: 07/07/2012 PCP: [name], MD  Brief narrative:  Assessment/Plan: Acute encephalopathy due to Intentional benzodiazepine overdose  -initial concern for possible meningitis so LP was completed in the ER  -CSF culture has remained negative - admit history did not support that diagnosis (no leukocytosis, fever or meningeal signs once awake) so low likelihood of meningitis therefore antibiotics were stopped and MRI brain dc'd  -all offending sedating meds were held at admission and have not been resumed prior to discharge to psych hospital  -she has awakened fully and admitted to intentional OD therefore she was involuntarily committed, required a Recruitment consultant and formal psychiatry evaluation  -psychiatrist recommended additional inpatient treatment but pt DOES NOT have IVC in place -threatening to leave AMA today-dc delayed 24 hrs while chronic narc meds confirmed -now bed ready and RN has contacted Psych re: pt AMA risk- plan is to send to [facility] ASAP -after pt calmed down and spoke with [facility] staff she agreed to voluntarily go for inpatient treatment  Chronic pain/Fibromyalgia  -is followed OP by a pain clinic in [place]  -her usual meds were resumed 6/30  -once her pre admit pain meds can be verified She can dc to [facility]   Hypoglycemia  -for unknown reasons this patient who endorses issues with hypoglycemia was an Acarbose pre admit - an A1c was normal at 5.0  -this medicine was dc'd this admit and our recommendation is to not resume after dc   Hypothyroidism  -Synthroid resumed 6/30   Hypokalemia  -resolved after IV repletion   Fine rash low back  -hydrocortizone cream started 6/30 -pt refused PE today    DVT prophylaxis: Lovenox Code Status: Full Family Communication: Patient only- very angry and threatening to leave AMA- tells MD "you can't keep me  here-I know my rights- You have to let me talk to a lawyer first" Explained until psychiatrist says she's not a safety risk that she cannot be dc'd or leave AMA Disposition Plan: Discharge to [facility] Isolation: None Nutritional Status: Stable  Consultants: Psychiatry  Procedures: Lumbar Puncture 6/28 by EDP  Antibiotics: None  HPI/Subjective: As above. Very angry. Pulled out IV and removed telemetry leads.   Objective: Blood pressure 112/76, pulse 56, temperature 98.4 F (36.9 C), temperature source Oral, resp. rate 11, height 5\' 9"  (1.753 m), weight 108.1 kg (238 lb 5.1 oz), SpO2 97.00%.  Intake/Output Summary (Last 24 hours) at 07/10/12 1108 Last data filed at 07/10/12 1000  Gross per 24 hour  Intake   3383 ml  Output      0 ml  Net   3383 ml     Exam: General: No acute respiratory distress Lungs: Clear to auscultation bilaterally without wheezes or crackles, RA Cardiovascular: Regular rate and rhythm without murmur gallop or rub normal S1 and S2, no peripheral edema or JVD Abdomen: Nontender, nondistended, soft, bowel sounds positive, no rebound, no ascites, no appreciable mass Musculoskeletal: No significant cyanosis, clubbing of bilateral lower extremities Neurological: Alert and oriented x 3, moves all extremities x 4 without focal neurological deficits, CN 2-12 intact  Scheduled Meds: Scheduled Meds: . calcium-vitamin D  1 tablet Oral Q breakfast  . diclofenac  2 patch Transdermal BID  . enoxaparin (LOVENOX) injection  40 mg Subcutaneous Daily  . hydrocortisone cream   Topical BID  . insulin aspart  0-9 Units Subcutaneous TID WC  . levothyroxine  200 mcg Oral QAC breakfast  . loratadine  10 mg Oral Daily  . OxyCODONE  30 mg Oral TID  . pantoprazole  40 mg Oral Daily  . potassium chloride  40 mEq Oral BID  . sodium chloride  3 mL Intravenous Q12H  . topiramate  200 mg Oral QHS  . Vitamin D (Ergocalciferol)  50,000 Units Oral 2 times weekly   Continuous  Infusions: . 0.9 % NaCl with KCl 20 mEq / L 100 mL/hr at 07/09/12 2300    **Reviewed in detail by the Attending Physician   Data Reviewed: Basic Metabolic Panel:  Recent Labs Lab 07/07/12 2037 07/08/12 0440 07/09/12 0545  NA 138 140 138  K 2.7* 3.0* 4.0  CL 106 110 108  CO2 21 22 20   GLUCOSE 132* 93 94  BUN 21 14 10   CREATININE 0.78 0.64 0.77  CALCIUM 8.0* 7.9* 8.4  MG  --  1.9  --    Liver Function Tests:  Recent Labs Lab 07/07/12 2037 07/09/12 0545  AST 73* 55*  ALT 33 33  ALKPHOS 76 75  BILITOT 0.5 0.3  PROT 6.2 5.8*  ALBUMIN 3.2* 2.9*   No results found for this basename: LIPASE, AMYLASE,  in the last 168 hours No results found for this basename: AMMONIA,  in the last 168 hours CBC:  Recent Labs Lab 07/07/12 2037 07/08/12 0440 07/09/12 0545  WBC 5.6 5.4 5.4  NEUTROABS 4.2  --   --   HGB 12.3 11.6* 11.9*  HCT 36.5 34.9* 35.3*  MCV 91.0 91.8 91.9  PLT 141* 149* 148*   Cardiac Enzymes: No results found for this basename: CKTOTAL, CKMB, CKMBINDEX, TROPONINI,  in the last 168 hours BNP (last 3 results)  Recent Labs  07/31/11 0245  PROBNP 206.2*   CBG:  Recent Labs Lab 07/09/12 1644 07/09/12 1937 07/10/12 0013 07/10/12 0337 07/10/12 0802  GLUCAP 82 82 78 146* 70    Recent Results (from the past 240 hour(s))  URINE CULTURE     Status: None   Collection Time    07/07/12  7:17 PM      Result Value Range Status   Specimen Description URINE, CATHETERIZED   Final   Special Requests NONE   Final   Culture  Setup Time 07/08/2012 03:32   Final   Colony Count NO GROWTH   Final   Culture NO GROWTH   Final   Report Status 07/09/2012 FINAL   Final  CSF CULTURE     Status: None   Collection Time    07/07/12 10:47 PM      Result Value Range Status   Specimen Description CSF   Final   Special Requests NO CYTOLOGY   Final   Gram Stain     Final   Value: FEW WBC PRESENT,BOTH PMN AND MONONUCLEAR     NO ORGANISMS SEEN   Culture NO GROWTH 1 DAY    Final   Report Status PENDING   Incomplete  GRAM STAIN     Status: None   Collection Time    07/07/12 10:47 PM      Result Value Range Status   Specimen Description CSF TUBE 2   Final   Special Requests NO CYTOLOGY   Final   Gram Stain     Final   Value: CYTOSPIN     WBC PRESENT,BOTH PMN AND MONONUCLEAR     NO ORGANISMS SEEN   Report Status 07/08/2012 FINAL   Final  MRSA  PCR SCREENING     Status: None   Collection Time    07/08/12  1:47 AM      Result Value Range Status   MRSA by PCR NEGATIVE  NEGATIVE Final   Comment:            The GeneXpert MRSA Assay (FDA     approved for NASAL specimens     only), is one component of a     comprehensive MRSA colonization     surveillance program. It is not     intended to diagnose MRSA     infection nor to guide or     monitor treatment for     MRSA infections.     Studies:  Recent x-ray studies have been reviewed in detail by the Attending Physician    Junious Silk, ANP [facility] Office  [phone] Pager [phone]  **If unable to reach the above provider after paging please contact the Flow Manager @ [phone]  On-Call/Text Page:      [email or web address]      password [email or web address]  If 7PM-7AM, please contact night-coverage [email or web address] Password [email or web address] 07/10/2012, 11:08 AM   LOS: 3 days   I have examined the patient, reviewed the chart and modified the above note which I agree with.   Manan Olmo,MD [phone] 07/10/2012, 1:58 PM
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
## 2012-07-10 NOTE — Clinical Social Work Psych Note (Signed)
Psych CSW received a request from pt to take shower.  Psych CSW relayed this to the RN.    Psych CSW called to f/u with RN re: pt request and to ensure RN was aware of [facility] being ready for the pt to be admitted today.  RN aware.  Pt in shower.  Vickii Penna, LCSWA [phone]  Clinical Social Work
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
## 2012-07-10 NOTE — Consult Note (Signed)
MEDICATION RELATED CONSULT NOTE - INITIAL   Pharmacy Consult to verify patient's PTA pain medications.    Allergies  Allergen Reactions  . Iodine Other (See Comments)    Sensitive when used gynecologically (intra-vaginally)  . Iron Itching    Iron taken by mouth causes this reaction the patient has had several iron infusions plus Feraheme that has not caused any side effects  . Sulfonamide Derivatives Other (See Comments)    Sensitive when used intra-vaginally.  . Benazepril Hcl     REACTION: severe cough  . Codeine     REACTION: AMS, feeling of passing out  . Penicillins     REACTION: severe family allergy.  Family physician long ago stated she shouldn't take this med since mother and brother nearly died from anaphylaxis.  . Amitriptyline     Sleep walking  . Lotensin (Benazepril)     cough     Medical History: Past Medical History  Diagnosis Date  . Osteoporosis   . Hypothyroidism   . Diabetes mellitus   . Exogenous obesity   . Rhinitis   . Arthritis   . Fibromyalgia   . Somnolence   . Sleep apnea   . Anemia, iron deficiency 04/06/2011  . Pernicious anemia 04/06/2011     Assessment: I called the patient's pharmacy, [facility] and verified with the pharmacist that her pain medications are as follows:   1. Oxycodone/Acetaminophen 10/325 mg  Take 1 tablet every 8  Hours.  Prescribed by Dr. Santo Held from [facility] [phone]). Last filled on 06/12/12.   2.  Oxycontin 30 mg Take 1 tablet TID  (same prescriber as noted above). Last filled on 06/18/12.  [facility] pharmacist stated patient has these filled every month regularly.    Plan:  I spoke via phone to Thurman Coyer, RN, [facility] and to Junious Silk, NP, [facility] and gave them this verification information of this patient's PTA pain medications.   Noah Delaine, RPh Clinical Pharmacist Pager: [phone] 07/10/2012,11:28 AM

## 2012-07-11 DIAGNOSIS — F332 Major depressive disorder, recurrent severe without psychotic features: Principal | ICD-10-CM

## 2012-07-11 LAB — CSF CULTURE W GRAM STAIN: Culture: NO GROWTH

## 2012-07-11 LAB — GLUCOSE, CAPILLARY: Glucose-Capillary: 83 mg/dL (ref 70–99)

## 2012-07-11 MED ORDER — QUETIAPINE FUMARATE 100 MG PO TABS
100.0000 mg | ORAL_TABLET | Freq: Every day | ORAL | Status: DC
  Administered 2012-07-11 – 2012-07-12 (×2): 100 mg via ORAL
  Filled 2012-07-11 (×4): qty 1
  Filled 2012-07-11: qty 3

## 2012-07-11 MED ORDER — OMEGA-3-ACID ETHYL ESTERS 1 G PO CAPS
1.0000 g | ORAL_CAPSULE | Freq: Two times a day (BID) | ORAL | Status: DC
  Administered 2012-07-11 – 2012-07-13 (×4): 1 g via ORAL
  Filled 2012-07-11: qty 6
  Filled 2012-07-11 (×7): qty 1
  Filled 2012-07-11: qty 6
  Filled 2012-07-11: qty 1

## 2012-07-11 MED ORDER — VITAMIN D (ERGOCALCIFEROL) 1.25 MG (50000 UNIT) PO CAPS
50000.0000 [IU] | ORAL_CAPSULE | ORAL | Status: DC
  Filled 2012-07-11: qty 1

## 2012-07-11 MED ORDER — ONDANSETRON HCL 4 MG PO TABS: 4.0000 mg | ORAL_TABLET | Freq: Three times a day (TID) | ORAL | Status: DC | PRN

## 2012-07-11 MED ORDER — SUMATRIPTAN SUCCINATE 50 MG PO TABS
50.0000 mg | ORAL_TABLET | ORAL | Status: DC | PRN
  Administered 2012-07-11 – 2012-07-13 (×2): 50 mg via ORAL
  Filled 2012-07-11 (×2): qty 1

## 2012-07-11 MED ORDER — ESCITALOPRAM OXALATE 20 MG PO TABS
20.0000 mg | ORAL_TABLET | Freq: Every day | ORAL | Status: DC
  Administered 2012-07-11 – 2012-07-13 (×3): 20 mg via ORAL
  Filled 2012-07-11: qty 1
  Filled 2012-07-11: qty 3
  Filled 2012-07-11 (×3): qty 1

## 2012-07-11 NOTE — BHH Group Notes (Signed)
[facility] LCSW Aftercare Discharge Planning Group Note   07/11/2012 8:45 AM  Participation Quality:  Alert and Appropriate   Mood/Affect:  Appropriate, Tearful   Depression Rating:  4  Anxiety Rating:  9  Thoughts of Suicide:  Pt denies SI/HI  Will you contract for safety?   Yes  Current AVH:  No  Plan for Discharge/Comments:  Pt attended discharge planning group and actively participated in group.  CSW provided pt with today's workbook.  Pt states that she came to the hospital after an overdose due to dealing with a lot of stress at home with her son and grieving the loss of her parents.  Pt states that she is followed by [facility] for medication management and therapy; CSW will confirm pt's follow up appointments.  Pt lives in [place] with adult son and can return home there.  No further needs voiced by pt at this time.    Transportation Means: Pt has access to transportation  Supports: Pt names her sister as her main support  Reyes Ivan, LCSWA 07/11/2012 9:49 AM

## 2012-07-11 NOTE — Progress Notes (Signed)
Patient ID: Valerie Silva, female   DOB: 1960-12-19, 52 y.o.   MRN: [id number]   D: Pt is still focused on med adm. Informed the writer that her day was "pretty decent". After being told that she had oxy ir and percocet available, pt also requested oxycontin.  Informed that she couldn't get the scheduled med. Pt then requested immitrex for a headache.  Informed the writer of how she had to come back from breakfast, and how she forced herself to eat. Pt stated that eating helped her feel better.  A:  Support and encouragement was offered. 15 min checks continued for safety.  R: Pt remains safe.
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
## 2012-07-11 NOTE — Tx Team (Signed)
Interdisciplinary Treatment Plan Update  Date Reviewed: 07/11/2012   Time Reviewed: 9:45 AM   Progress in Treatment:  Attending groups: Yes  Participating in groups: Yes  Taking medication as prescribed: Yes  Tolerating medication: Yes  Family/Significant other contact made: CSW will attempt to reach her sister Patient understands diagnosis: Yes  Discussing patient identified problems/goals with staff: Yes  Medical problems stabilized or resolved: Yes  Denies suicidal/homicidal ideation: Yes Patient has not harmed self or others: Yes   For review of initial/current patient goals, please see plan of care.   Estimated Length of Stay: 3-5 days  Reasons for Continued Hospitalization:  Anxiety  Depression  Medication stabilization   New Problems/Goals identified: N/A  Discharge Plan or Barriers: Pt has follow up scheduled at [facility] for medication management and therapy.    Additional Comments: N/A  Attendees:  Patient:    Signature:    Signature: Dr. Carmelina Dane  07/11/2012 10:22 AM   Signature: Waynetta Sandy, RN  07/11/2012 10:21 AM   Signature: Harold Barban, RN  07/11/2012 10:21 AM   Signature: Burnetta Sabin, RN 07/11/2012 10:21 AM   Signature: Juline Patch, LCSW  07/11/2012 10:21 AM   Signature: Reyes Ivan, LCSW  07/11/2012 10:21 AM   Signature: Maseta Dorley,Care Coordinator  07/11/2012 10:21 AM   Signature: Fransisca Kaufmann, [facility]  07/11/2012 10:21 AM   Signature:    Signature:    Signature:    Scribe for Treatment Team:  Reyes Ivan, LCSWA, 07/11/2012 10:21 AM

## 2012-07-11 NOTE — [facility] Group Notes (Signed)
[facility] LCSW Group Therapy  07/11/2012  1:15 PM   Type of Therapy:  Group Therapy  Participation Level:  Active  Participation Quality:  Appropriate and Attentive  Affect:  Appropriate, tearful and depressed  Cognitive:  Alert and Appropriate  Insight:  Developing/Improving and Engaged  Engagement in Therapy:  Developing/Improving and Engaged  Modes of Intervention:  Clarification, Confrontation, Discussion, Education, Exploration, Limit-setting, Orientation, Problem-solving, Rapport Building, Dance movement psychotherapist, Socialization and Support  Summary of Progress/Problems: The topic for group today was emotional regulation.  This group focused on both positive and negative emotion identification and allowed group members to process ways to identify feelings, regulate negative emotions, and find healthy ways to manage internal/external emotions. Group members were asked to reflect on a time when their reaction to an emotion led to a negative outcome and explored how alternative responses using emotion regulation would have benefited them. Group members were also asked to discuss a time when emotion regulation was utilized when a negative emotion was experienced.  Pt shared that she manages her emotions negatively by shutting down and keeping them all inside.  Pt was able to process how doing this led to her suicide attempt, resulting in feelings of anger and disappointment.  Pt was tearful in sharing this and states that she feels she's disappointed her deceased parents and continues to grieve this loss.  Pt states that she can manage her emotions in a positive manner by reaching out and talking to people in the future.  Pt actively participated and was engaged in group discussion.    Reyes Ivan, [place] 07/11/2012 2:33 PM
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
## 2012-07-11 NOTE — Progress Notes (Signed)
Adult Psychoeducational Group Note  Date:  07/11/2012 Time:  8;00PM Group Topic/Focus:  Wrap-Up Group:   The focus of this group is to help patients review their daily goal of treatment and discuss progress on daily workbooks.  Participation Level:  Active  Participation Quality:  Appropriate and Attentive  Affect:  Appropriate  Cognitive:  Alert and Appropriate  Insight: Appropriate  Engagement in Group:  Engaged  Modes of Intervention:  Discussion  Additional Comments:  Pt was attentive and appropriate during tonight's group discussion. Pt. Stated that she is having a better day and having pain all over. Pt stated that she is hoping to get some sleep tonight. Pt.  Stated that visitors came to visit her today and that she is building a support system.   Bing Plume D 07/11/2012, 8:58 PM
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
## 2012-07-11 NOTE — Progress Notes (Signed)
D: Patient appropriate and cooperative with staff. Patient's affect/mood is depressed. She reported on the self inventory sheet that her sleep/appetite is poor, energy level is low and ability to pay attention is good. Patient complained of anxiety 9/10. Patient rated depression "4" and feelings of hopelessness "1". She's attending and participating in groups. Patient is compliant with medication regimen.  A: Support and encouragement provided to patient. Administered scheduled medications per ordering MD. Monitor Q15 minute checks for safety.  R: Patient receptive. Denies SI/HI/AVH. Patient remains safe on the unit.

## 2012-07-12 LAB — GLUCOSE, CAPILLARY
Glucose-Capillary: 69 mg/dL — ABNORMAL LOW (ref 70–99)
Glucose-Capillary: 59 mg/dL — ABNORMAL LOW (ref 70–99)
Glucose-Capillary: 119 mg/dL — ABNORMAL HIGH (ref 70–99)
Glucose-Capillary: 82 mg/dL (ref 70–99)

## 2012-07-12 MED ORDER — HYDROXYZINE HCL 50 MG PO TABS
50.0000 mg | ORAL_TABLET | Freq: Every evening | ORAL | Status: DC | PRN
  Administered 2012-07-12: 50 mg via ORAL
  Filled 2012-07-12: qty 6

## 2012-07-12 MED ORDER — HYDROCHLOROTHIAZIDE 12.5 MG PO CAPS
12.5000 mg | ORAL_CAPSULE | Freq: Every day | ORAL | Status: DC
  Administered 2012-07-12 – 2012-07-13 (×2): 12.5 mg via ORAL
  Filled 2012-07-12 (×4): qty 1

## 2012-07-12 MED ORDER — HYDROXYZINE HCL 25 MG PO TABS
25.0000 mg | ORAL_TABLET | Freq: Four times a day (QID) | ORAL | Status: DC | PRN
  Administered 2012-07-12: 25 mg via ORAL

## 2012-07-12 NOTE — [facility] Suicide Risk Assessment (Signed)
[facility] INPATIENT:  Family/Significant Other Suicide Prevention Education  Suicide Prevention Education:  Education Completed; Valerie Silva - sister [phone]),  (name of family member/significant other) has been identified by the patient as the family member/significant other with whom the patient will be residing, and identified as the person(s) who will aid the patient in the event of a mental health crisis (suicidal ideations/suicide attempt).  With written consent from the patient, the family member/significant other has been provided the following suicide prevention education, prior to the and/or following the discharge of the patient.  The suicide prevention education provided includes the following:  Suicide risk factors  Suicide prevention and interventions  National Suicide Hotline telephone number  [facility] assessment telephone number  [facility] Emergency Assistance 911  [facility] and/or Residential Mobile Crisis Unit telephone number  Request made of family/significant other to:  Remove weapons (e.g., guns, rifles, knives), all items previously/currently identified as safety concern.    Remove drugs/medications (over-the-counter, prescriptions, illicit drugs), all items previously/currently identified as a safety concern.  The family member/significant other verbalizes understanding of the suicide prevention education information provided.  The family member/significant other agrees to remove the items of safety concern listed above.  Carmina Miller 07/12/2012, 3:41 PM

## 2012-07-12 NOTE — Progress Notes (Signed)
Adult Psychoeducational Group Note  Date:  07/12/2012 Time:  1:14 PM  Group Topic/Focus:  Building Self Esteem:   The Focus of this group is helping patients become aware of the effects of self-esteem on their lives, the things they and others do that enhance or undermine their self-esteem, seeing the relationship between their level of self-esteem and the choices they make and learning ways to enhance self-esteem.  Participation Level:  Active  Participation Quality:  Appropriate, Sharing and Supportive  Affect:  Depressed and Flat  Cognitive:  Appropriate and Oriented  Insight: Appropriate  Engagement in Group:  Engaged, Improving and Supportive  Modes of Intervention:  Discussion, Education, Socialization and Support  Additional Comments:  The purpose of this group is to identify positive self-esteem and triggers for negative self-esteem. Pt participated minimally at the beginning of group and then began to open up and share her own stories and provide support for the patients. Pt stated that what makes her feel good and have high self-esteem is that she is a good listener to others and likes helping other people.  Caswell Corwin 07/12/2012, 1:14 PM
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
## 2012-07-12 NOTE — Progress Notes (Signed)
[facility] MD Progress Note  07/12/2012 2:12 PM Valerie Silva  MRN:  [id number] Subjective:  Patient reports not sleeping well last night which has affected her ability to focus today. She reports feeling very anxious about a situation with her son and fears he may end up in jail. Patient rates her anxiety at six and depression at one. Denies SI stating "I will never do that again. It was a moment of weakness." She reports her sister will be coming to visit one day on Saturday and wants to spend the day.  Diagnosis:   Axis I: Major Depression, Recurrent severe Axis II: Deferred Axis III:  Past Medical History  Diagnosis Date  . Osteoporosis   . Hypothyroidism   . Diabetes mellitus   . Exogenous obesity   . Rhinitis   . Arthritis   . Fibromyalgia   . Somnolence   . Sleep apnea   . Anemia, iron deficiency 04/06/2011  . Pernicious anemia 04/06/2011  . Depression   . Anxiety    Axis IV: housing problems, other psychosocial or environmental problems and problems with primary support group Axis V: 51-60 moderate symptoms  ADL's:  Intact  Sleep: Poor  Appetite:  Fair  Suicidal Ideation:  Denies SI Homicidal Ideation:  Denies SI AEB (as evidenced by):  Psychiatric Specialty Exam: Review of Systems  Constitutional: Negative.   HENT: Negative.   Eyes: Negative.   Respiratory: Negative.   Cardiovascular: Negative.   Gastrointestinal: Negative.   Genitourinary: Negative.   Musculoskeletal: Negative.   Skin: Negative.   Neurological: Negative.   Endo/Heme/Allergies: Negative.   Psychiatric/Behavioral: Positive for depression. Negative for suicidal ideas, hallucinations, memory loss and substance abuse. The patient is nervous/anxious and has insomnia.     Blood pressure 121/83, pulse 73, temperature 98.5 F (36.9 C), temperature source Oral, resp. rate 18, height 5\' 9"  (1.753 m), weight 107.049 kg (236 lb), last menstrual period 01/10/1990.Body mass index is 34.84 kg/(m^2).  General  Appearance: Casual  Eye Contact::  Good  Speech:  Clear and Coherent  Volume:  Normal  Mood:  Anxious and Depressed  Affect:  Full Range  Thought Process:  Goal Directed and Intact  Orientation:  Full (Time, Place, and Person)  Thought Content:  WDL  Suicidal Thoughts:  No  Homicidal Thoughts:  No  Memory:  Immediate;   Good Recent;   Good Remote;   Good  Judgement:  Fair  Insight:  Present  Psychomotor Activity:  Normal  Concentration:  Fair  Recall:  Good  Akathisia:  No  Handed:  Right  AIMS (if indicated):     Assets:  Communication Skills Desire for Improvement Housing Leisure Time Social Support  Sleep:  Number of Hours: 3.25   Current Medications: Current Facility-Administered Medications  Medication Dose Route Frequency Provider Last Rate Last Dose  . acetaminophen (TYLENOL) tablet 650 mg  650 mg Oral Q6H PRN Nehemiah Settle, MD      . alum & mag hydroxide-simeth (MAALOX/MYLANTA) 200-200-20 MG/5ML suspension 30 mL  30 mL Oral Q4H PRN Nehemiah Settle, MD      . calcium-vitamin D (OSCAL WITH D) 500-200 MG-UNIT per tablet 1 tablet  1 tablet Oral Q breakfast Nehemiah Settle, MD   1 tablet at 07/12/12 0818  . diclofenac (FLECTOR) 1.3 % 2 patch  2 patch Transdermal BID Nehemiah Settle, MD   2 patch at 07/12/12 [phone]  . escitalopram (LEXAPRO) tablet 20 mg  20 mg Oral Daily Fransisca Kaufmann,  NP   20 mg at 07/12/12 0818  . hydrochlorothiazide (MICROZIDE) capsule 12.5 mg  12.5 mg Oral Daily Fransisca Kaufmann, NP      . hydrocortisone cream 1 %   Topical BID Nehemiah Settle, MD      . hydrOXYzine (ATARAX/VISTARIL) tablet 25 mg  25 mg Oral Q6H PRN Kerry Hough, PA-C   25 mg at 07/12/12 0101  . levothyroxine (SYNTHROID, LEVOTHROID) tablet 200 mcg  200 mcg Oral QAC breakfast Nehemiah Settle, MD   200 mcg at 07/12/12 0647  . loratadine (CLARITIN) tablet 10 mg  10 mg Oral Daily Nehemiah Settle, MD   10 mg at 07/12/12 0819   . magnesium hydroxide (MILK OF MAGNESIA) suspension 30 mL  30 mL Oral Daily PRN Nehemiah Settle, MD      . methocarbamol (ROBAXIN) tablet 750 mg  750 mg Oral Q8H PRN Nehemiah Settle, MD   750 mg at 07/12/12 0647  . omega-3 acid ethyl esters (LOVAZA) capsule 1 g  1 g Oral BID Fransisca Kaufmann, NP   1 g at 07/12/12 0818  . ondansetron (ZOFRAN) tablet 4 mg  4 mg Oral Q8H PRN Fransisca Kaufmann, NP      . oxyCODONE (Oxy IR/ROXICODONE) immediate release tablet 5 mg  5 mg Oral Q8H PRN Nehemiah Settle, MD   5 mg at 07/12/12 0647  . OxyCODONE (OXYCONTIN) 12 hr tablet 30 mg  30 mg Oral TID Nehemiah Settle, MD   30 mg at 07/12/12 1153  . oxyCODONE-acetaminophen (PERCOCET/ROXICET) 5-325 MG per tablet 1 tablet  1 tablet Oral Q8H PRN Nehemiah Settle, MD   1 tablet at 07/12/12 0647  . pantoprazole (PROTONIX) EC tablet 40 mg  40 mg Oral Daily Nehemiah Settle, MD   40 mg at 07/12/12 0819  . potassium chloride SA (K-DUR,KLOR-CON) CR tablet 40 mEq  40 mEq Oral BID Nehemiah Settle, MD   40 mEq at 07/12/12 0818  . QUEtiapine (SEROQUEL) tablet 100 mg  100 mg Oral QHS Fransisca Kaufmann, NP   100 mg at 07/11/12 2125  . SUMAtriptan (IMITREX) tablet 50 mg  50 mg Oral Q2H PRN Fransisca Kaufmann, NP   50 mg at 07/11/12 2127  . topiramate (TOPAMAX) tablet 200 mg  200 mg Oral QHS Nehemiah Settle, MD   200 mg at 07/11/12 2125  . Vitamin D (Ergocalciferol) (DRISDOL) capsule 50,000 Units  50,000 Units Oral 2 times weekly Nehemiah Settle, MD   50,000 Units at 07/12/12 0820    Lab Results:  Results for orders placed during the hospital encounter of 07/10/12 (from the past 48 hour(s))  GLUCOSE, CAPILLARY     Status: None   Collection Time    07/11/12  8:26 AM      Result Value Range   Glucose-Capillary 83  70 - 99 mg/dL  GLUCOSE, CAPILLARY     Status: None   Collection Time    07/11/12 11:35 AM      Result Value Range   Glucose-Capillary 83  70 - 99 mg/dL   GLUCOSE, CAPILLARY     Status: None   Collection Time    07/11/12  5:04 PM      Result Value Range   Glucose-Capillary 78  70 - 99 mg/dL   Comment 1 Notify RN    GLUCOSE, CAPILLARY     Status: Abnormal   Collection Time    07/12/12  6:33 AM  Result Value Range   Glucose-Capillary 69 (*) 70 - 99 mg/dL  GLUCOSE, CAPILLARY     Status: Abnormal   Collection Time    07/12/12 11:52 AM      Result Value Range   Glucose-Capillary 59 (*) 70 - 99 mg/dL   Comment 1 Documented in Chart     Comment 2 Notify RN    GLUCOSE, CAPILLARY     Status: Abnormal   Collection Time    07/12/12  1:09 PM      Result Value Range   Glucose-Capillary 119 (*) 70 - 99 mg/dL    Physical Findings: AIMS: Facial and Oral Movements Muscles of Facial Expression: None, normal Lips and Perioral Area: None, normal Jaw: None, normal Tongue: None, normal,Extremity Movements Upper (arms, wrists, hands, fingers): None, normal Lower (legs, knees, ankles, toes): None, normal, Trunk Movements Neck, shoulders, hips: None, normal, Overall Severity Severity of abnormal movements (highest score from questions above): None, normal Incapacitation due to abnormal movements: None, normal Patient's awareness of abnormal movements (rate only patient's report): No Awareness, Dental Status Current problems with teeth and/or dentures?: No Does patient usually wear dentures?: No  CIWA:  CIWA-Ar Total: 4 COWS:  COWS Total Score: 4  Treatment Plan Summary: Daily contact with patient to assess and evaluate symptoms and progress in treatment Medication management  Plan: Continue crisis management and stabilization.  Medication management: Reviewed with patient who stated no untoward effects. Initiate Vistaril 50 mg at hs prn insomnia with repeat dose.  Encouraged patient to attend groups and participate in group counseling sessions and activities.  Discharge plan in progress. Anticipate d/c tomorrow.  Address health issues:  Order HCTZ 12.5 mg for complaints of mild ankle swelling.  Continue current treatment plan.   Medical Decision Making Problem Points:  Established problem, stable/improving (1) and Review of psycho-social stressors (1) Data Points:  Review of medication regiment & side effects (2)  I certify that inpatient services furnished can reasonably be expected to improve the patient's condition.   Doloras Tellado, [place] NP-C 07/12/2012, 2:12 PM

## 2012-07-12 NOTE — [facility] Group Notes (Signed)
[facility] LCSW Aftercare Discharge Planning Group Note   07/12/2012 8:45 AM  Participation Quality:  Alert and Appropriate   Mood/Affect:  Appropriate ,Flat and Depressed  Depression Rating: 2-3  Anxiety Rating:  2-3  Thoughts of Suicide:  Pt denies SI/HI  Will you contract for safety?   Yes  Current AVH:  No  Plan for Discharge/Comments:  Pt attended discharge planning group and actively participated in group.  CSW provided pt with today's workbook.  Pt reports poor sleep last night.  Pt discussed worrying about her son at home and if he is taking care of what needs to.  Pt states that she wants to d/c tomorrow for the holiday.  Pt has follow up scheduled at [facility] for medication management and therapy. Pt lives in [place] with adult son and can return home there.  No further needs voiced by pt at this time.    Transportation Means: Pt has access to transportation  Supports: Pt names her sister as her main support  Reyes Ivan, LCSWA 07/12/2012 9:50 AM

## 2012-07-12 NOTE — [facility] Group Notes (Signed)
[facility] LCSW Group Therapy  [facility] 1:15 - 2:30 PM  07/12/2012. 12:07 PM   Type of Therapy:  Group Therapy  Participation Level:  Minimal  Participation Quality:  Attentive  Affect:  Appropriate  Cognitive:  Appropriate  Insight:  Developing/Improving   Engagement in Therapy:  Developing/Improving   Modes of Intervention:  Discussion, Education, Exploration, Problem-Solving, Rapport Building, Support   Summary of Progress/Problems:  Patient listened attentively to speaker from [facility].but made no comments on the presentation.   Wynn Banker 07/12/2012  12:07 PM

## 2012-07-12 NOTE — Progress Notes (Signed)
Provided spiritual support and grief work around pt's relationship with son, loss of mother and father, loss of relationship with brothers.

## 2012-07-12 NOTE — Progress Notes (Signed)
D: Patient's affect/mood is blunted and depressed. She reported on the self inventory sheet that she had to request medication for sleep, appetite/ability to pay attention are good and energy level is low. Patient rated depression "3" and feelings of hopelessness "1". She's participating in group activities and compliant with current medication regimen.  A: Support and encouragement provided to patient. Scheduled medications administered per MD orders. Maintain Q15 minute checks for safety.  R: Patient receptive. Denies SI/HI/AVH. Patient remains safe.

## 2012-07-13 DIAGNOSIS — F339 Major depressive disorder, recurrent, unspecified: Secondary | ICD-10-CM

## 2012-07-13 LAB — GLUCOSE, CAPILLARY: Glucose-Capillary: 80 mg/dL (ref 70–99)

## 2012-07-13 MED ORDER — LEVOTHYROXINE SODIUM 200 MCG PO TABS: 200.0000 ug | ORAL_TABLET | Freq: Every day | ORAL | Status: DC

## 2012-07-13 MED ORDER — OMEGA-3-ACID ETHYL ESTERS 1 G PO CAPS: 1.0000 g | ORAL_CAPSULE | Freq: Two times a day (BID) | ORAL | Status: DC

## 2012-07-13 MED ORDER — QUETIAPINE FUMARATE 100 MG PO TABS: 100.0000 mg | ORAL_TABLET | Freq: Every day | ORAL | Status: DC

## 2012-07-13 MED ORDER — LORATADINE 10 MG PO TABS: 10.0000 mg | ORAL_TABLET | Freq: Two times a day (BID) | ORAL | Status: AC

## 2012-07-13 MED ORDER — DICLOFENAC EPOLAMINE 1.3 % TD PTCH: 2.0000 | MEDICATED_PATCH | Freq: Two times a day (BID) | TRANSDERMAL | Status: DC

## 2012-07-13 MED ORDER — ESCITALOPRAM OXALATE 20 MG PO TABS: 20.0000 mg | ORAL_TABLET | Freq: Every day | ORAL | Status: DC

## 2012-07-13 MED ORDER — TOPIRAMATE 200 MG PO TABS: 200.0000 mg | ORAL_TABLET | Freq: Every day | ORAL | Status: DC

## 2012-07-13 MED ORDER — ERGOCALCIFEROL 1.25 MG (50000 UT) PO CAPS: 50000.0000 [IU] | ORAL_CAPSULE | ORAL | Status: DC

## 2012-07-13 MED ORDER — HYDROCHLOROTHIAZIDE 12.5 MG PO CAPS: 12.5000 mg | ORAL_CAPSULE | Freq: Every day | ORAL | Status: DC

## 2012-07-13 MED ORDER — OMEPRAZOLE 40 MG PO CPDR: 40.0000 mg | DELAYED_RELEASE_CAPSULE | Freq: Every day | ORAL | Status: AC

## 2012-07-13 MED ORDER — OXYCODONE HCL ER 30 MG PO T12A: 30.0000 mg | EXTENDED_RELEASE_TABLET | Freq: Two times a day (BID) | ORAL | Status: DC

## 2012-07-13 MED ORDER — ONDANSETRON 4 MG PO TBDP
4.0000 mg | ORAL_TABLET | Freq: Three times a day (TID) | ORAL | Status: DC | PRN
Start: 2012-07-13 — End: 2012-07-13
  Administered 2012-07-13: 4 mg via ORAL

## 2012-07-13 MED ORDER — POTASSIUM CHLORIDE CRYS ER 20 MEQ PO TBCR: 40.0000 meq | EXTENDED_RELEASE_TABLET | Freq: Two times a day (BID) | ORAL | Status: DC

## 2012-07-13 MED ORDER — HYDROXYZINE HCL 50 MG PO TABS: 50.0000 mg | ORAL_TABLET | Freq: Every evening | ORAL | Status: DC | PRN

## 2012-07-13 MED ORDER — OXYCODONE-ACETAMINOPHEN 10-325 MG PO TABS: 1.0000 | ORAL_TABLET | Freq: Three times a day (TID) | ORAL | Status: DC

## 2012-07-13 NOTE — Progress Notes (Signed)
[facility] Adult Case Management Discharge Plan :  Will you be returning to the same living situation after discharge: Yes,  returning home At discharge, do you have transportation home?:Yes,  access to transportation Do you have the ability to pay for your medications:Yes,  access to meds  Release of information consent forms completed and in the chart;  Patient's signature needed at discharge.  Patient to Follow up at: Follow-up Information   Follow up with [facility] On 07/19/2012. (Appointment scheduled at 1:00 pm with Shaaron Adler for therapy.  Will schedule medication management after this appointment.)    Contact information:   [address] [place], [place] [address] Phone: [phone] Fax: [phone]      Patient denies SI/HI:   Yes,  denies SI/HI    Safety Planning and Suicide Prevention discussed:  Yes,  discussed with pt and pt's sister.  See suicide prevention note.  Carmina Miller 07/13/2012, 8:50 AM
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
## 2012-07-13 NOTE — Progress Notes (Signed)
Pt d/c from hospital with her husband. All items returned. D/C instructions given with teach back. Prescriptions given and samples given. Pt denies si and hi.

## 2012-07-13 NOTE — Plan of Care (Signed)
Problem: Ineffective individual coping Goal: STG: Patient will remain free from self harm Outcome: Completed/Met Date Met:  07/13/12 Pt denies si   Problem: Alteration in mood & ability to function due to Goal: LTG-Pt reports reduction in suicidal thoughts (Patient reports reduction in suicidal thoughts and is able to verbalize a safety plan for whenever patient is feeling suicidal)  Outcome: Completed/Met Date Met:  07/13/12 Pt denies si Goal: STG-Patient will report withdrawal symptoms Outcome: Completed/Met Date Met:  07/13/12 No withdrawal symptoms  Problem: Diagnosis: Increased Risk For Suicide Attempt Goal: LTG-Patient Will Report Improvement in Psychotic Symptoms LTG (by discharge) : Patient will report improvement in psychotic symptoms.  No psychotic symptoms Goal: STG-Patient Will Report Suicidal Feelings to Staff Outcome: Completed/Met Date Met:  07/13/12 Pt denies si
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
## 2012-07-17 NOTE — Progress Notes (Signed)
Patient Discharge Instructions:  After Visit Summary (AVS):   Faxed to:  07/17/12 Discharge Summary Note:   Faxed to:  07/17/12 Psychiatric Admission Assessment Note:   Faxed to:  07/17/12 Suicide Risk Assessment - Discharge Assessment:   Faxed to:  07/17/12 Faxed/Sent to the Next Level Care provider:  07/17/12 Faxed to [facility] @ [phone]  Jerelene Redden, 07/17/2012, 4:08 PM

## 2012-07-20 ENCOUNTER — Other Ambulatory Visit: Payer: Self-pay | Admitting: Hematology & Oncology

## 2012-07-24 ENCOUNTER — Other Ambulatory Visit: Payer: Self-pay | Admitting: Hematology & Oncology

## 2012-08-06 ENCOUNTER — Ambulatory Visit: Payer: Medicare Other

## 2012-08-06 ENCOUNTER — Other Ambulatory Visit (HOSPITAL_BASED_OUTPATIENT_CLINIC_OR_DEPARTMENT_OTHER): Payer: Medicare Other | Admitting: Lab

## 2012-08-06 ENCOUNTER — Other Ambulatory Visit: Payer: Self-pay | Admitting: *Deleted

## 2012-08-06 ENCOUNTER — Ambulatory Visit (HOSPITAL_BASED_OUTPATIENT_CLINIC_OR_DEPARTMENT_OTHER): Payer: Medicare Other

## 2012-08-06 ENCOUNTER — Ambulatory Visit (HOSPITAL_BASED_OUTPATIENT_CLINIC_OR_DEPARTMENT_OTHER): Payer: Medicare Other | Admitting: Hematology & Oncology

## 2012-08-06 VITALS — BP 103/57 | HR 89 | Temp 98.0°F | Resp 16 | Ht 69.0 in | Wt 234.0 lb

## 2012-08-06 DIAGNOSIS — D509 Iron deficiency anemia, unspecified: Secondary | ICD-10-CM

## 2012-08-06 DIAGNOSIS — Z9884 Bariatric surgery status: Secondary | ICD-10-CM

## 2012-08-06 DIAGNOSIS — G8929 Other chronic pain: Secondary | ICD-10-CM

## 2012-08-06 DIAGNOSIS — M545 Low back pain: Secondary | ICD-10-CM

## 2012-08-06 DIAGNOSIS — D510 Vitamin B12 deficiency anemia due to intrinsic factor deficiency: Secondary | ICD-10-CM

## 2012-08-06 LAB — CBC WITH DIFFERENTIAL (CANCER CENTER ONLY)
BASO#: 0 10*3/uL (ref 0.0–0.2)
Eosinophils Absolute: 0 10*3/uL (ref 0.0–0.5)
HGB: 12 g/dL (ref 11.6–15.9)
MCH: 31.4 pg (ref 26.0–34.0)
MCV: 98 fL (ref 81–101)
MONO%: 8.2 % (ref 0.0–13.0)
NEUT#: 3.8 10*3/uL (ref 1.5–6.5)
RBC: 3.82 10*6/uL (ref 3.70–5.32)

## 2012-08-06 LAB — IRON AND TIBC CHCC
%SAT: 4 % — ABNORMAL LOW (ref 21–57)
Iron: 10 ug/dL — ABNORMAL LOW (ref 41–142)

## 2012-08-06 MED ORDER — OXYCODONE-ACETAMINOPHEN 10-325 MG PO TABS: 1.0000 | ORAL_TABLET | Freq: Three times a day (TID) | ORAL | Status: DC

## 2012-08-06 MED ORDER — DIPHENHYDRAMINE HCL 25 MG PO CAPS
25.0000 mg | ORAL_CAPSULE | Freq: Once | ORAL | Status: AC
  Administered 2012-08-06: 25 mg via ORAL

## 2012-08-06 MED ORDER — METHYLPREDNISOLONE SODIUM SUCC 125 MG IJ SOLR
60.0000 mg | Freq: Once | INTRAMUSCULAR | Status: AC
  Administered 2012-08-06: 60 mg via INTRAVENOUS

## 2012-08-06 MED ORDER — OXYCODONE HCL ER 30 MG PO T12A: 30.0000 mg | EXTENDED_RELEASE_TABLET | Freq: Two times a day (BID) | ORAL | Status: DC

## 2012-08-06 MED ORDER — SODIUM CHLORIDE 0.9 % IV SOLN
1020.0000 mg | Freq: Once | INTRAVENOUS | Status: AC
  Administered 2012-08-06: 1020 mg via INTRAVENOUS
  Filled 2012-08-06: qty 34

## 2012-08-06 MED ORDER — CYANOCOBALAMIN 1000 MCG/ML IJ SOLN
1000.0000 ug | Freq: Once | INTRAMUSCULAR | Status: AC
  Administered 2012-08-06: 1000 ug via INTRAMUSCULAR

## 2012-08-06 NOTE — Progress Notes (Signed)
This office note has been dictated.

## 2012-08-06 NOTE — Patient Instructions (Signed)
Ferumoxytol injection What is this medicine? FERUMOXYTOL is an iron complex. Iron is used to make healthy red blood cells, which carry oxygen and nutrients throughout the body. This medicine is used to treat iron deficiency anemia in people with chronic kidney disease. This medicine may be used for other purposes; ask your health care provider or pharmacist if you have questions. What should I tell my health care provider before I take this medicine? They need to know if you have any of these conditions: -anemia not caused by low iron levels -high levels of iron in the blood -magnetic resonance imaging (MRI) test scheduled -an unusual or allergic reaction to iron, other medicines, foods, dyes, or preservatives -pregnant or trying to get pregnant -breast-feeding How should I use this medicine? This medicine is for infusion into a vein. It is given by a health care professional in a hospital or clinic setting. Talk to your pediatrician regarding the use of this medicine in children. Special care may be needed. Overdosage: If you think you've taken too much of this medicine contact a poison control center or emergency room at once. Overdosage: If you think you have taken too much of this medicine contact a poison control center or emergency room at once. NOTE: This medicine is only for you. Do not share this medicine with others. What if I miss a dose? It is important not to miss your dose. Call your doctor or health care professional if you are unable to keep an appointment. What may interact with this medicine? This medicine may interact with the following medications: -other iron products This list may not describe all possible interactions. Give your health care provider a list of all the medicines, herbs, non-prescription drugs, or dietary supplements you use. Also tell them if you smoke, drink alcohol, or use illegal drugs. Some items may interact with your medicine. What should I watch  for while using this medicine? Visit your doctor or healthcare professional regularly. Tell your doctor or healthcare professional if your symptoms do not start to get better or if they get worse. You may need blood work done while you are taking this medicine. You may need to follow a special diet. Talk to your doctor. Foods that contain iron include: whole grains/cereals, dried fruits, beans, or peas, leafy green vegetables, and organ meats (liver, kidney). What side effects may I notice from receiving this medicine? Side effects that you should report to your doctor or health care professional as soon as possible: -allergic reactions like skin rash, itching or hives, swelling of the face, lips, or tongue -breathing problems -changes in blood pressure -feeling faint or lightheaded, falls -fever or chills -flushing, sweating, or hot feelings -swelling of the ankles or feet Side effects that usually do not require medical attention (Report these to your doctor or health care professional if they continue or are bothersome.): -diarrhea -headache -nausea, vomiting -stomach pain This list may not describe all possible side effects. Call your doctor for medical advice about side effects. You may report side effects to FDA at 1-800-FDA-1088. Where should I keep my medicine? This drug is given in a hospital or clinic and will not be stored at home. NOTE: This sheet is a summary. It may not cover all possible information. If you have questions about this medicine, talk to your doctor, pharmacist, or health care provider.  2013, [employer]. (09/19/2007 9:48:25 PM)  
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
## 2012-09-03 ENCOUNTER — Ambulatory Visit: Payer: Self-pay

## 2012-09-03 ENCOUNTER — Ambulatory Visit (HOSPITAL_BASED_OUTPATIENT_CLINIC_OR_DEPARTMENT_OTHER): Payer: Medicare Other

## 2012-09-03 VITALS — BP 92/53 | HR 87 | Temp 98.1°F | Resp 18

## 2012-09-03 DIAGNOSIS — D510 Vitamin B12 deficiency anemia due to intrinsic factor deficiency: Secondary | ICD-10-CM

## 2012-09-03 MED ORDER — CYANOCOBALAMIN 1000 MCG/ML IJ SOLN
1000.0000 ug | Freq: Once | INTRAMUSCULAR | Status: AC
  Administered 2012-09-03: 1000 ug via INTRAMUSCULAR

## 2012-09-03 NOTE — Patient Instructions (Signed)

## 2012-10-01 ENCOUNTER — Other Ambulatory Visit (HOSPITAL_BASED_OUTPATIENT_CLINIC_OR_DEPARTMENT_OTHER): Payer: Medicare Other | Admitting: *Deleted

## 2012-10-01 ENCOUNTER — Ambulatory Visit: Payer: Self-pay

## 2012-10-01 ENCOUNTER — Ambulatory Visit: Payer: Medicare Other

## 2012-10-01 VITALS — BP 113/72 | HR 78 | Temp 96.7°F

## 2012-10-01 DIAGNOSIS — D510 Vitamin B12 deficiency anemia due to intrinsic factor deficiency: Secondary | ICD-10-CM

## 2012-10-01 MED ORDER — ALPRAZOLAM 2 MG PO TABS: 2.0000 mg | ORAL_TABLET | Freq: Three times a day (TID) | ORAL | Status: DC | PRN

## 2012-10-01 MED ORDER — CYANOCOBALAMIN 1000 MCG/ML IJ SOLN
1000.0000 ug | Freq: Once | INTRAMUSCULAR | Status: AC
  Administered 2012-10-01: 1000 ug via INTRAMUSCULAR

## 2012-10-01 MED ORDER — CYANOCOBALAMIN 1000 MCG/ML IJ SOLN
INTRAMUSCULAR | Status: AC
  Filled 2012-10-01: qty 1

## 2012-10-01 NOTE — Telephone Encounter (Signed)
Gave patient rx to take home

## 2012-10-01 NOTE — Patient Instructions (Addendum)
Cyanocobalamin, Pyridoxine, and Folate What is this medicine? A multivitamin containing folic acid, vitamin B6, and vitamin B12. This medicine may be used for other purposes; ask your health care provider or pharmacist if you have questions. What should I tell my health care provider before I take this medicine? They need to know if you have any of these conditions: -bleeding or clotting disorder -history of anemia of any type -other chronic health condition -an unusual or allergic reaction to vitamins, other medicines, foods, dyes, or preservatives -pregnant or trying to get pregnant -breast-feeding How should I use this medicine? Take by mouth with a glass of water. May take with food. Follow the directions on the prescription label. It is usually given once a day. Do not take your medicine more often than directed. Contact your pediatrician regarding the use of this medicine in children. Special care may be needed. Overdosage: If you think you have taken too much of this medicine contact a poison control center or emergency room at once. NOTE: This medicine is only for you. Do not share this medicine with others. What if I miss a dose? If you miss a dose, take it as soon as you can. If it is almost time for your next dose, take only that dose. Do not take double or extra doses. What may interact with this medicine? -levodopa This list may not describe all possible interactions. Give your health care provider a list of all the medicines, herbs, non-prescription drugs, or dietary supplements you use. Also tell them if you smoke, drink alcohol, or use illegal drugs. Some items may interact with your medicine. What should I watch for while using this medicine? See your health care professional for regular checks on your progress. Remember that vitamin supplements do not replace the need for good nutrition from a balanced diet. What side effects may I notice from receiving this medicine? Side  effects that you should report to your doctor or health care professional as soon as possible: -allergic reaction such as skin rash or difficulty breathing -vomiting Side effects that usually do not require medical attention (report to your doctor or health care professional if they continue or are bothersome): -nausea -stomach upset This list may not describe all possible side effects. Call your doctor for medical advice about side effects. You may report side effects to FDA at 1-800-FDA-1088. Where should I keep my medicine? Keep out of the reach of children. Most vitamins should be stored at controlled room temperature. Check your specific product directions. Protect from heat and moisture. Throw away any unused medicine after the expiration date. NOTE: This sheet is a summary. It may not cover all possible information. If you have questions about this medicine, talk to your doctor, pharmacist, or health care provider.  2012, [employer]. (02/17/2007 12:59:55 AM)

## 2012-10-02 ENCOUNTER — Telehealth: Payer: Self-pay | Admitting: Hematology & Oncology

## 2012-10-02 NOTE — Telephone Encounter (Signed)
Patient called and cx 10/26/12 apt due to coming out of town.  She resch for 11/14/12

## 2012-10-26 ENCOUNTER — Other Ambulatory Visit: Payer: Self-pay | Admitting: Lab

## 2012-10-26 ENCOUNTER — Ambulatory Visit: Payer: Self-pay

## 2012-10-26 ENCOUNTER — Ambulatory Visit: Payer: Self-pay | Admitting: Hematology & Oncology

## 2012-11-07 ENCOUNTER — Telehealth: Payer: Self-pay | Admitting: Hematology & Oncology

## 2012-11-07 ENCOUNTER — Other Ambulatory Visit: Payer: Self-pay | Admitting: Hematology & Oncology

## 2012-11-07 NOTE — Telephone Encounter (Signed)
Pt aware moved 11-5 to 12-3. She needs med refill I transferred her to RN VM

## 2012-11-14 ENCOUNTER — Ambulatory Visit: Payer: Self-pay | Admitting: Hematology & Oncology

## 2012-11-14 ENCOUNTER — Ambulatory Visit: Payer: Self-pay

## 2012-11-14 ENCOUNTER — Other Ambulatory Visit: Payer: Self-pay | Admitting: Lab

## 2012-12-11 ENCOUNTER — Other Ambulatory Visit: Payer: Self-pay | Admitting: Nurse Practitioner

## 2012-12-11 DIAGNOSIS — D510 Vitamin B12 deficiency anemia due to intrinsic factor deficiency: Secondary | ICD-10-CM

## 2012-12-12 ENCOUNTER — Ambulatory Visit (HOSPITAL_BASED_OUTPATIENT_CLINIC_OR_DEPARTMENT_OTHER): Payer: Medicare Other

## 2012-12-12 ENCOUNTER — Ambulatory Visit (HOSPITAL_BASED_OUTPATIENT_CLINIC_OR_DEPARTMENT_OTHER): Payer: Medicare Other | Admitting: Hematology & Oncology

## 2012-12-12 ENCOUNTER — Other Ambulatory Visit (HOSPITAL_BASED_OUTPATIENT_CLINIC_OR_DEPARTMENT_OTHER): Payer: Medicare Other | Admitting: Lab

## 2012-12-12 ENCOUNTER — Encounter: Payer: Self-pay | Admitting: Hematology & Oncology

## 2012-12-12 VITALS — BP 98/69 | HR 73 | Temp 97.7°F | Resp 14 | Ht 69.0 in | Wt 254.0 lb

## 2012-12-12 DIAGNOSIS — E538 Deficiency of other specified B group vitamins: Secondary | ICD-10-CM

## 2012-12-12 DIAGNOSIS — F418 Other specified anxiety disorders: Secondary | ICD-10-CM

## 2012-12-12 DIAGNOSIS — D509 Iron deficiency anemia, unspecified: Secondary | ICD-10-CM

## 2012-12-12 DIAGNOSIS — D510 Vitamin B12 deficiency anemia due to intrinsic factor deficiency: Secondary | ICD-10-CM

## 2012-12-12 DIAGNOSIS — F411 Generalized anxiety disorder: Secondary | ICD-10-CM

## 2012-12-12 HISTORY — DX: Iron deficiency anemia, unspecified: D50.9

## 2012-12-12 LAB — CBC WITH DIFFERENTIAL (CANCER CENTER ONLY)
BASO%: 0.8 % (ref 0.0–2.0)
HCT: 39.6 % (ref 34.8–46.6)
LYMPH#: 1.4 10*3/uL (ref 0.9–3.3)
MONO#: 0.3 10*3/uL (ref 0.1–0.9)
NEUT#: 1.8 10*3/uL (ref 1.5–6.5)
NEUT%: 50.6 % (ref 39.6–80.0)
Platelets: 155 10*3/uL (ref 145–400)
RDW: 12.7 % (ref 11.1–15.7)
WBC: 3.6 10*3/uL — ABNORMAL LOW (ref 3.9–10.0)

## 2012-12-12 MED ORDER — ALPRAZOLAM 2 MG PO TABS: 2.0000 mg | ORAL_TABLET | Freq: Three times a day (TID) | ORAL | Status: DC | PRN

## 2012-12-12 MED ORDER — CYANOCOBALAMIN 1000 MCG/ML IJ SOLN
INTRAMUSCULAR | Status: AC
  Filled 2012-12-12: qty 1

## 2012-12-12 MED ORDER — CYANOCOBALAMIN 1000 MCG/ML IJ SOLN
1000.0000 ug | Freq: Once | INTRAMUSCULAR | Status: AC
  Administered 2012-12-12: 1000 ug via INTRAMUSCULAR

## 2012-12-12 NOTE — Progress Notes (Signed)
This office note has been dictated.

## 2012-12-13 LAB — IRON AND TIBC CHCC
%SAT: 63 % — ABNORMAL HIGH (ref 21–57)
TIBC: 258 ug/dL (ref 236–444)
UIBC: 96 ug/dL — ABNORMAL LOW (ref 120–384)

## 2012-12-13 LAB — FERRITIN CHCC: Ferritin: 363 ng/ml — ABNORMAL HIGH (ref 9–269)

## 2012-12-16 NOTE — Progress Notes (Signed)
DIAGNOSES: 1. Intermittent iron-deficiency anemia. 2. Vitamin B12 deficiency. 3. Severe anxiety. 4. History of gastric bypass.  CURRENT THERAPY: 1. IV iron as indicated; the patient last received a dose in July of     2014. 2. Vitamin B12 1 mg IM every month.  INTERIM HISTORY:  Ms. Reddix comes in for followup.  She looks much more relaxed now.  A "burden" has been lifted off her.  Her son is now in jail.  This does cause some issues for her, but she knows that this is where he belongs.  She can already see a change in him.  He is getting his GED.  He is much more respectful.  He and she actually are talking now.  I think that this is really good for her, and I am just thankful that she is at peace.  She overall feels pretty well.  We gave her iron back in July.  At that point in time, her iron saturation was only 4%.  Her Ferritin was 365, which is mostly an acute phase reactant.  She has had no bleeding.  There has been no change in bowel or bladder habits.  She has had no cough.  There have been no rashes.  She has gained quite a bit of weight.  She has been on intermittent prednisone for some type of adrenal gland issue.  PHYSICAL EXAMINATION:  General:  This is a mildly obese white female in no obvious distress.  Vital Signs:  Temperature of 97.7, pulse 73, respiratory rate 14, blood pressure 98/68.  Weight is 254 pounds.  Head and Neck:  Normocephalic, atraumatic skull.  She has no ocular or oral lesions.  There is no scleral icterus.  There is no adenopathy in the neck.  Thyroid is nonpalpable.  Lungs:  Clear bilaterally.  Cardiac: Regular rate and rhythm with normal S1 and S2.  There are no murmurs, rubs, or bruits.  Abdomen:  Soft.  She has good bowel sounds.  She is mildly obese.  She has a laparotomy scar from the gastric bypass.  There is no palpable hepatosplenomegaly.  Back:  No tenderness over the spine, ribs, or hips.  Extremities:  No clubbing, cyanosis, or  edema.  She has some osteoarthritic changes in her joints.  She has good muscle strength in the upper and lower extremities.  Neurological:  No focal neurological deficits.  LABORATORY STUDIES:  White cell count 3.6, hemoglobin 12.5, hematocrit 39.6, platelet count 155.  MCV is 98.  IMPRESSION:  Ms. Prout is a very charming 52 year old white female. She has a history of gastric bypass.  She is iron deficient.  She also has B12 deficiency.  Her blood count is looking real good right now.  I felt it mostly glad that she has some peace in her life, knowing that her son is in jail, gaining him the help that he needs.  He is off drugs now.  He actually is going to church in jail.  We will go ahead and give her the B12 today and continue with this every month.  I did refill her Xanax, which really helps her out.  I will plan to see her back myself in another 3 months or so.    ______________________________ Josph Macho, M.D. PRE/MEDQ  D:  12/12/2012  T:  12/16/2012  Job:  [id number]

## 2013-01-08 ENCOUNTER — Telehealth: Payer: Self-pay | Admitting: Hematology & Oncology

## 2013-01-08 NOTE — Telephone Encounter (Signed)
Pt called today to cancel inj appt.

## 2013-01-09 ENCOUNTER — Ambulatory Visit: Payer: Self-pay

## 2013-01-14 ENCOUNTER — Other Ambulatory Visit: Payer: Self-pay | Admitting: Hematology & Oncology

## 2013-01-22 ENCOUNTER — Other Ambulatory Visit: Payer: Self-pay | Admitting: Hematology & Oncology

## 2013-02-06 ENCOUNTER — Ambulatory Visit: Payer: Self-pay

## 2013-02-07 ENCOUNTER — Other Ambulatory Visit: Payer: Self-pay | Admitting: Nurse Practitioner

## 2013-02-07 DIAGNOSIS — F418 Other specified anxiety disorders: Secondary | ICD-10-CM

## 2013-02-07 MED ORDER — ALPRAZOLAM 2 MG PO TABS: 2.0000 mg | ORAL_TABLET | Freq: Three times a day (TID) | ORAL | Status: DC | PRN

## 2013-03-06 ENCOUNTER — Ambulatory Visit (HOSPITAL_BASED_OUTPATIENT_CLINIC_OR_DEPARTMENT_OTHER): Payer: Medicare Other | Admitting: Hematology & Oncology

## 2013-03-06 ENCOUNTER — Encounter: Payer: Self-pay | Admitting: Hematology & Oncology

## 2013-03-06 ENCOUNTER — Other Ambulatory Visit (HOSPITAL_BASED_OUTPATIENT_CLINIC_OR_DEPARTMENT_OTHER): Payer: Medicare Other | Admitting: Lab

## 2013-03-06 ENCOUNTER — Ambulatory Visit: Payer: Self-pay

## 2013-03-06 VITALS — BP 125/80 | HR 87 | Temp 98.0°F | Resp 14 | Ht 69.0 in | Wt 261.0 lb

## 2013-03-06 DIAGNOSIS — M810 Age-related osteoporosis without current pathological fracture: Secondary | ICD-10-CM

## 2013-03-06 DIAGNOSIS — D509 Iron deficiency anemia, unspecified: Secondary | ICD-10-CM

## 2013-03-06 DIAGNOSIS — F341 Dysthymic disorder: Secondary | ICD-10-CM

## 2013-03-06 DIAGNOSIS — F418 Other specified anxiety disorders: Secondary | ICD-10-CM

## 2013-03-06 DIAGNOSIS — Z9884 Bariatric surgery status: Secondary | ICD-10-CM

## 2013-03-06 DIAGNOSIS — D510 Vitamin B12 deficiency anemia due to intrinsic factor deficiency: Secondary | ICD-10-CM

## 2013-03-06 DIAGNOSIS — E039 Hypothyroidism, unspecified: Secondary | ICD-10-CM

## 2013-03-06 DIAGNOSIS — Z1239 Encounter for other screening for malignant neoplasm of breast: Secondary | ICD-10-CM

## 2013-03-06 DIAGNOSIS — E538 Deficiency of other specified B group vitamins: Secondary | ICD-10-CM

## 2013-03-06 LAB — CBC WITH DIFFERENTIAL (CANCER CENTER ONLY)
BASO#: 0 10*3/uL (ref 0.0–0.2)
BASO%: 0.7 % (ref 0.0–2.0)
EOS%: 1.8 % (ref 0.0–7.0)
Eosinophils Absolute: 0.1 10*3/uL (ref 0.0–0.5)
HEMATOCRIT: 36.7 % (ref 34.8–46.6)
HGB: 11.7 g/dL (ref 11.6–15.9)
LYMPH#: 0.9 10*3/uL (ref 0.9–3.3)
LYMPH%: 30.5 % (ref 14.0–48.0)
MCH: 30.2 pg (ref 26.0–34.0)
MCHC: 31.9 g/dL — ABNORMAL LOW (ref 32.0–36.0)
MCV: 95 fL (ref 81–101)
MONO#: 0.2 10*3/uL (ref 0.1–0.9)
MONO%: 7.4 % (ref 0.0–13.0)
NEUT%: 59.6 % (ref 39.6–80.0)
NEUTROS ABS: 1.7 10*3/uL (ref 1.5–6.5)
Platelets: 196 10*3/uL (ref 145–400)
RBC: 3.88 10*6/uL (ref 3.70–5.32)
RDW: 12.6 % (ref 11.1–15.7)
WBC: 2.8 10*3/uL — ABNORMAL LOW (ref 3.9–10.0)

## 2013-03-06 LAB — COMPREHENSIVE METABOLIC PANEL (CC13)
ALK PHOS: 105 U/L (ref 40–150)
ALT: 36 U/L (ref 0–55)
AST: 44 U/L — ABNORMAL HIGH (ref 5–34)
Albumin: 3.4 g/dL — ABNORMAL LOW (ref 3.5–5.0)
Anion Gap: 9 mEq/L (ref 3–11)
BUN: 27.1 mg/dL — AB (ref 7.0–26.0)
CALCIUM: 8.8 mg/dL (ref 8.4–10.4)
CO2: 23 mEq/L (ref 22–29)
Chloride: 104 mEq/L (ref 98–109)
Creatinine: 1 mg/dL (ref 0.6–1.1)
GLUCOSE: 106 mg/dL (ref 70–140)
Potassium: 4.4 mEq/L (ref 3.5–5.1)
Sodium: 136 mEq/L (ref 136–145)
Total Bilirubin: 0.5 mg/dL (ref 0.20–1.20)
Total Protein: 6.6 g/dL (ref 6.4–8.3)

## 2013-03-06 LAB — VITAMIN B12: VITAMIN B 12: 554 pg/mL (ref 211–911)

## 2013-03-06 LAB — RETICULOCYTES (CHCC)
ABS Retic: 58.2 10*3/uL (ref 19.0–186.0)
RBC.: 3.88 MIL/uL (ref 3.87–5.11)
RETIC CT PCT: 1.5 % (ref 0.4–2.3)

## 2013-03-06 MED ORDER — ALPRAZOLAM 2 MG PO TABS: ORAL_TABLET | ORAL | Status: DC

## 2013-03-06 NOTE — Progress Notes (Signed)
No injection given today

## 2013-03-07 LAB — IRON AND TIBC CHCC
%SAT: 44 % (ref 21–57)
IRON: 103 ug/dL (ref 41–142)
TIBC: 234 ug/dL — AB (ref 236–444)
UIBC: 131 ug/dL (ref 120–384)

## 2013-03-07 LAB — FERRITIN CHCC: Ferritin: 519 ng/ml — ABNORMAL HIGH (ref 9–269)

## 2013-03-07 NOTE — Progress Notes (Signed)
  DIAGNOSIS:  Intermittent iron deficiency anemia  Vitamin B12 deficiency  Gastric bypass  Severe anxiety   CURRENT THERAPY:  IV iron as indicated by the patient last received a dose of Feraheme in July of 2014.  Vitamin B12 1 mg IM every visit     INTERIM HISTORY:  [name] comes in for followup. She I together with her ex-husband. He has had a stroke and heart attack.    She is still doing with a lot of anxiety. Her son is in prison. He apparently will be released in March. Hopefully this will go well.    She feels tired. She does not have much energy. She's gained quite awake. I think she sees an endocrinologist. She is on Synthroid. He also is on quite a few other medications.    When we last saw her, her ferritin was 363 with an iron saturation of 63%.    There's been no bleeding. Her appetite has been okay. She's had pain issues but these are chronic. There's been no change in bowel bladder habits.    She has osteoporosis. She does not absorb vitamin D. As such, she likely will need IV Reclast . We will set this up. I am not sure her last mammogram was. I think this probably was a few years ago. She needs this.  She's had no fever. She has had some nausea but no vomiting. There's been no leg swelling.  PHYSICAL EXAMINATION:   Obese white female in no obvious distress. Vital signs are temperature 90.8 pulse 87 with heart rate 14 blood pressure 145/80 and weight is 261 pounds. Head and neck exam shows no ocular or oral lesions. Neuro palpable cervical or supraclavicular lymph nodes. Lungs are clear. Cardiac exam regular in rhythm with no murmurs rubs or bruits. Abdomen soft. Good bowel sounds. There is no fluid wave. To laparotomy scars. There is no palpable hepato- splenomegaly exam no tenderness over the spine. Extremities show some mild nonpitting edema of the legs. Skin exam no rashes. Neurological exam shows no focal neurological deficits.   LABORATORY  STUDIES:   White cell count is 2.8. Hemoglobin 0.7 hematocrit 36.7 reticulocyte count 196. MCV is 95. Vitamin B12 is 524. Ferritin is 590 with an iron saturation of 44.    On her blood smear, she does have some microcytic red cells. There is some aniisocytosis. There are no nucleated red cells. There are no target cells. I see no schistocytes. White cells per mature. Shows adequate platelets.   IMPRESSION:   [name] is a 53 year old white female with history of gastric bypass. Because of this, she has iron deficiency and pernicious anemia.    Her iron  studies aren't all that bad right now. I thought it would be lower. I don't see she is any iron right now.    We will plan to get her back in about 6 weeks. At that point, she probably will need iron. We will also do Reclast at the same time. She will get B12. We might be able to go every 3 months with her B12.    About half hour with her. She does has a lot of personal issues that she has to deal with.   Valerie Silva,Valerie Silva R, MD 03/07/2013

## 2013-03-08 ENCOUNTER — Telehealth: Payer: Self-pay | Admitting: Hematology & Oncology

## 2013-03-08 ENCOUNTER — Telehealth: Payer: Self-pay | Admitting: Nurse Practitioner

## 2013-03-08 NOTE — Telephone Encounter (Addendum)
Message copied by Glee ArvinPICKENPACK-COUSAR, Jocob Dambach N on Fri Mar 08, 2013 11:51 AM ------      Message from: Arlan OrganENNEVER, PETER R      Created: Thu Mar 07, 2013 12:25 PM       Call - iron level is actually good!!  Your kidney function is fine!!!  Cindee LamePete ------Pt verbalized understanding and appreciation.

## 2013-03-08 NOTE — Telephone Encounter (Signed)
Pt aware of 3-12 mammogram at [facility], and 4-8 MD

## 2013-03-13 ENCOUNTER — Ambulatory Visit: Payer: Self-pay

## 2013-03-21 ENCOUNTER — Ambulatory Visit: Payer: Self-pay

## 2013-04-10 ENCOUNTER — Other Ambulatory Visit: Payer: Self-pay | Admitting: *Deleted

## 2013-04-10 DIAGNOSIS — F418 Other specified anxiety disorders: Secondary | ICD-10-CM

## 2013-04-10 MED ORDER — ALPRAZOLAM 2 MG PO TABS: ORAL_TABLET | ORAL | Status: DC

## 2013-04-17 ENCOUNTER — Ambulatory Visit: Payer: Self-pay | Admitting: Hematology & Oncology

## 2013-04-17 ENCOUNTER — Other Ambulatory Visit: Payer: Self-pay | Admitting: Lab

## 2013-04-17 ENCOUNTER — Ambulatory Visit: Payer: Self-pay

## 2013-04-17 ENCOUNTER — Telehealth: Payer: Self-pay | Admitting: Hematology & Oncology

## 2013-04-17 NOTE — Telephone Encounter (Signed)
Pt cx 4-8 rescheduled for 5-13. I left voice mail for RN to triage

## 2013-05-03 ENCOUNTER — Emergency Department (HOSPITAL_COMMUNITY)
Admission: EM | Admit: 2013-05-03 | Discharge: 2013-05-03 | Disposition: A | Discharge status: Home / Self Care | Payer: Medicare Other | Attending: Emergency Medicine | Admitting: Emergency Medicine

## 2013-05-03 ENCOUNTER — Emergency Department (HOSPITAL_COMMUNITY): Payer: Medicare Other

## 2013-05-03 ENCOUNTER — Encounter (HOSPITAL_COMMUNITY): Payer: Self-pay | Admitting: Emergency Medicine

## 2013-05-03 DIAGNOSIS — Z862 Personal history of diseases of the blood and blood-forming organs and certain disorders involving the immune mechanism: Secondary | ICD-10-CM | POA: Insufficient documentation

## 2013-05-03 DIAGNOSIS — Y9289 Other specified places as the place of occurrence of the external cause: Secondary | ICD-10-CM | POA: Insufficient documentation

## 2013-05-03 DIAGNOSIS — Z791 Long term (current) use of non-steroidal anti-inflammatories (NSAID): Secondary | ICD-10-CM | POA: Insufficient documentation

## 2013-05-03 DIAGNOSIS — W19XXXA Unspecified fall, initial encounter: Secondary | ICD-10-CM | POA: Insufficient documentation

## 2013-05-03 DIAGNOSIS — M810 Age-related osteoporosis without current pathological fracture: Secondary | ICD-10-CM | POA: Insufficient documentation

## 2013-05-03 DIAGNOSIS — Z79899 Other long term (current) drug therapy: Secondary | ICD-10-CM | POA: Insufficient documentation

## 2013-05-03 DIAGNOSIS — E039 Hypothyroidism, unspecified: Secondary | ICD-10-CM | POA: Insufficient documentation

## 2013-05-03 DIAGNOSIS — M129 Arthropathy, unspecified: Secondary | ICD-10-CM | POA: Insufficient documentation

## 2013-05-03 DIAGNOSIS — E669 Obesity, unspecified: Secondary | ICD-10-CM | POA: Insufficient documentation

## 2013-05-03 DIAGNOSIS — Z981 Arthrodesis status: Secondary | ICD-10-CM | POA: Insufficient documentation

## 2013-05-03 DIAGNOSIS — F3289 Other specified depressive episodes: Secondary | ICD-10-CM | POA: Insufficient documentation

## 2013-05-03 DIAGNOSIS — G4730 Sleep apnea, unspecified: Secondary | ICD-10-CM | POA: Insufficient documentation

## 2013-05-03 DIAGNOSIS — IMO0002 Reserved for concepts with insufficient information to code with codable children: Secondary | ICD-10-CM | POA: Insufficient documentation

## 2013-05-03 DIAGNOSIS — IMO0001 Reserved for inherently not codable concepts without codable children: Secondary | ICD-10-CM | POA: Insufficient documentation

## 2013-05-03 DIAGNOSIS — E119 Type 2 diabetes mellitus without complications: Secondary | ICD-10-CM | POA: Insufficient documentation

## 2013-05-03 DIAGNOSIS — F411 Generalized anxiety disorder: Secondary | ICD-10-CM | POA: Insufficient documentation

## 2013-05-03 DIAGNOSIS — Y9301 Activity, walking, marching and hiking: Secondary | ICD-10-CM | POA: Insufficient documentation

## 2013-05-03 DIAGNOSIS — S199XXA Unspecified injury of neck, initial encounter: Secondary | ICD-10-CM

## 2013-05-03 DIAGNOSIS — Z880 Allergy status to penicillin: Secondary | ICD-10-CM | POA: Insufficient documentation

## 2013-05-03 DIAGNOSIS — S0993XA Unspecified injury of face, initial encounter: Secondary | ICD-10-CM | POA: Insufficient documentation

## 2013-05-03 DIAGNOSIS — F329 Major depressive disorder, single episode, unspecified: Secondary | ICD-10-CM | POA: Insufficient documentation

## 2013-05-03 DIAGNOSIS — Z9884 Bariatric surgery status: Secondary | ICD-10-CM | POA: Insufficient documentation

## 2013-05-03 NOTE — ED Provider Notes (Signed)
CSN: [id number]     Arrival date & time 05/03/13  0527 History   First MD Initiated Contact with Patient 05/03/13 0601     Chief Complaint  Patient presents with  . Fall     (Consider location/radiation/quality/duration/timing/severity/associated sxs/prior Treatment) HPI Patient presents emergency department following a fall that occurred earlier this morning.  While walking her dog.  The patient takes Ativan and Percocet and it was very groggy when she was walking the dog.  Family states, that patient has been taking more medicines to help her sleep.  Patient denies chest pain, shortness breath, nausea, vomiting, abdominal pain, headache, blurred vision, weakness, numbness, dizziness, fever, or syncope.  Patient, states, that she did not take any other medications prior to arrival.  Patient does appear very groggy and somnolent.  This is normal.  The family states, that she takes his medications. Past Medical History  Diagnosis Date  . Osteoporosis   . Hypothyroidism   . Diabetes mellitus   . Exogenous obesity   . Rhinitis   . Arthritis   . Fibromyalgia   . Somnolence   . Sleep apnea   . Anemia, iron deficiency 04/06/2011  . Pernicious anemia 04/06/2011  . Depression   . Anxiety   . Iron deficiency anemia, unspecified 12/12/2012   Past Surgical History  Procedure Laterality Date  . Gastric bypass    . Cholecystectomy    . Tracheostomy    . Uvulectomy    . Tonsillectomy    . Mandible reconstruction    . Spinal fusion    . Dilation and curettage of uterus    . Cesarean section     Family History  Problem Relation Age of Onset  . CAD Father   . Diabetes Father   . CAD Brother      X3  . Hypertension Brother   . Colon cancer Brother    History  Substance Use Topics  . Smoking status: Never Smoker   . Smokeless tobacco: Never Used     Comment: never used product  . Alcohol Use: Yes     Comment: one mixed drink twice yearly   OB History   Grav Para Term Preterm  Abortions TAB SAB Ect Mult Living                 Review of Systems  All other systems negative except as documented in the HPI. All pertinent positives and negatives as reviewed in the HPI.   Allergies  Iodine; Iron; Lovaza; Soma; Sulfonamide derivatives; Benazepril hcl; Codeine; Penicillins; Amitriptyline; and Lotensin  Home Medications   Prior to Admission medications   Medication Sig Start Date End Date Taking? Authorizing Provider  acarbose (PRECOSE) 25 MG tablet Take 25 mg by mouth 3 (three) times daily with meals.  10/09/12   Historical Provider, MD  alprazolam Prudy Feeler(XANAX) 2 MG tablet Take 1 pill 4 times a day as needed for anxiety 04/10/13   Josph MachoPeter R Ennever, MD  Calcium Carbonate-Vitamin D (CALCIUM + D PO) Take 1 tablet by mouth every morning. Has magnesium and zinc in it also.    Historical Provider, MD  Cholecalciferol (VITAMIN D3) 10000 UNITS capsule Take 10,000 Units by mouth daily.    Historical Provider, MD  cyclobenzaprine (FLEXERIL) 10 MG tablet Take 10 mg by mouth 3 (three) times daily as needed for muscle spasms.    Historical Provider, MD  diclofenac (FLECTOR) 1.3 % PTCH Place 2 patches onto the skin 2 (two) times daily. 07/13/12  Fransisca Kaufmann, NP  diclofenac (VOLTAREN) 75 MG EC tablet Take 75 mg by mouth 2 (two) times daily.  07/16/12   Historical Provider, MD  escitalopram (LEXAPRO) 20 MG tablet Take 1 tablet (20 mg total) by mouth daily. For depression and anxiety. 07/13/12   Fransisca Kaufmann, NP  ibuprofen (ADVIL,MOTRIN) 800 MG tablet Take 800 mg by mouth every 6 (six) hours as needed for pain.    Historical Provider, MD  levothyroxine (SYNTHROID) 112 MCG tablet Take 112 mcg by mouth daily before breakfast. TAKES 2 TABS = 224 MCG.    Historical Provider, MD  loratadine (CLARITIN) 10 MG tablet Take 1 tablet (10 mg total) by mouth 2 (two) times daily. 07/13/12   Fransisca Kaufmann, NP  modafinil (PROVIGIL) 200 MG tablet Take 200 mg by mouth daily.  11/19/12   Historical Provider, MD  omeprazole  (PRILOSEC) 40 MG capsule Take 1 capsule (40 mg total) by mouth daily. 07/13/12   Fransisca Kaufmann, NP  OxyCODONE HCl ER 30 MG T12A Take 30 mg by mouth 3 (three) times daily. 08/06/12   Josph Macho, MD  oxyCODONE-acetaminophen (PERCOCET) 10-325 MG per tablet Take 1 tablet by mouth 3 (three) times daily. 08/06/12   Josph Macho, MD  promethazine (PHENERGAN) 25 MG tablet Take 25 mg by mouth at bedtime as needed. nausea    Historical Provider, MD  SUMAtriptan (IMITREX) 100 MG tablet Take 100 mg by mouth as needed for migraine.  05/01/12   Historical Provider, MD  topiramate (TOPAMAX) 200 MG tablet Take 1 tablet (200 mg total) by mouth at bedtime. 07/13/12   Fransisca Kaufmann, NP  triamterene-hydrochlorothiazide (MAXZIDE) 75-50 MG per tablet Take 1 tablet by mouth daily.  07/28/12   Historical Provider, MD  valACYclovir (VALTREX) 1000 MG tablet Take 1,000 mg by mouth as needed (for flare ups).  08/16/11   Historical Provider, MD   BP 99/65  Pulse 65  Temp(Src) 97.4 F (36.3 C) (Oral)  Resp 14  SpO2 94% Physical Exam  Nursing note and vitals reviewed. Constitutional: She is oriented to person, place, and time. She appears well-developed and well-nourished. No distress.  HENT:  Head: Normocephalic and atraumatic.  Mouth/Throat: Oropharynx is clear and moist.  Eyes: Pupils are equal, round, and reactive to light.  Neck: Normal range of motion. Neck supple.  Cardiovascular: Normal rate, regular rhythm and normal heart sounds.  Exam reveals no gallop and no friction rub.   No murmur heard. Pulmonary/Chest: Effort normal and breath sounds normal.  Musculoskeletal:       Cervical back: She exhibits tenderness and pain. She exhibits no bony tenderness, no edema, no deformity, no laceration and no spasm.       Thoracic back: She exhibits tenderness and pain. She exhibits normal range of motion, no bony tenderness, no deformity and no spasm.       Lumbar back: She exhibits tenderness and pain. She exhibits normal  range of motion and no deformity.  Neurological: She is alert and oriented to person, place, and time. She exhibits normal muscle tone. Coordination normal.  Skin: Skin is warm and dry.    ED Course  Procedures (including critical care time) Labs Review Labs Reviewed - No data to display  Imaging Review Dg Thoracic Spine 2 View  05/03/2013   CLINICAL DATA:  Mid to low back pain after a fall.  EXAM: THORACIC SPINE - 2 VIEW  COMPARISON:  DG CHEST 2 VIEW dated 07/07/2012; CT ANGIO CHEST W/CM &/OR WO/CM dated  05/30/2012  FINDINGS: Diffuse degenerative change throughout the thoracic spine. There is no evidence of thoracic spine fracture. Alignment is normal. Postoperative changes in the cervical spine. No other significant bone abnormalities are identified.  IMPRESSION: Degenerative changes in the thoracic spine. Normal alignment. No displaced fractures identified.   Electronically Signed   By: Burman NievesWilliam  Stevens M.D.   On: 05/03/2013 06:37   Dg Lumbar Spine 2-3 Views  05/03/2013   CLINICAL DATA:  Fall mid and low back.  EXAM: LUMBAR SPINE - 2-3 VIEW  COMPARISON:  DG EPIDURAL VENO/VERTE BROPL dated 06/25/2010; MR L SPINE W/O dated 06/23/2010  FINDINGS: Compression deformities of L1 and L2 post kyphoplasty. No significant progression of degree of compression since previous MRI study done before the kyphoplasties. Normal alignment of the lumbar spine. Degenerative changes with narrowed interspaces and endplate hypertrophy.  IMPRESSION: No acute bony abnormalities. Old L1 and L2 compression deformities post kyphoplasty.   Electronically Signed   By: Burman NievesWilliam  Stevens M.D.   On: 05/03/2013 06:35   Ct Cervical Spine Wo Contrast  05/03/2013   CLINICAL DATA:  Patient fell.  Patient is in a C-collar.  Trauma.  EXAM: CT CERVICAL SPINE WITHOUT CONTRAST  TECHNIQUE: Multidetector CT imaging of the cervical spine was performed without intravenous contrast. Multiplanar CT image reconstructions were also generated.   COMPARISON:  CT HEAD W/O CM dated 07/07/2012; MR HEAD WO/W CM dated 09/20/2011; CT C SPINE W/O CM dated 06/23/2008  FINDINGS: Postoperative changes with anterior plate and screw fixation and interbody fusion from C4 through C7. Fused segments demonstrate normal alignment two view. Hardware components appear intact and stable in position since prior study. Degenerative changes are present in the facet joints with normal alignment. The C1-2 articulation is intact. No vertebral compression deformities. No prevertebral soft tissue swelling. No focal bone lesion or bone destruction. Bone cortex and trabecular architecture appear intact. Incidental note of a prominent bone spur posteriorly at T1-2 which is causing effacement of the left lateral recess. Residual degenerative changes throughout the remainder of the cervical spine demonstrate no definite evidence of central stenosis. Infiltration or edema demonstrated in the lung apices.  IMPRESSION: Stable appearance of anterior fusion from C4 through C7. No displaced fractures identified.   Electronically Signed   By: Burman NievesWilliam  Stevens M.D.   On: 05/03/2013 06:31    Patient has negative imaging.  Family will take the patient home.  Patient is stable.  Told to return here as needed.    Carlyle Dollyhristopher W Zienna Ahlin, PA-C 05/03/13 1459

## 2013-05-03 NOTE — ED Provider Notes (Signed)
Medical screening examination/treatment/procedure(s) were conducted as a shared visit with non-physician practitioner(s) and myself.  I personally evaluated the patient during the encounter.  Patient arrived on spine board. Her speech is slurred which EMS reports is usual for her at this time a day due to her Xanax and Percocet. She is complaining of pain in her neck, upper back and lower back as a result of her fall as morning. She is also complaining of pain in her knees, greater on the right, which is not acute. Her entire spine is tender.    [name] L Laiah Pouncey, MD 05/03/13 2240

## 2013-05-03 NOTE — ED Notes (Signed)
Bed: [id number] Expected date: 05/03/13 Expected time: 5:18 AM Means of arrival: Ambulance Comments: 53 yo F  Fall

## 2013-05-03 NOTE — ED Notes (Signed)
NT Amber C took pt to [facility] in wheelchair on the way top d/c window. Pt is A&O and in NAD

## 2013-05-03 NOTE — ED Notes (Signed)
Pt arouses easily, then goes right back to sleep. Pt has even, unlabored resp. Pt in NAD, will continue to monitor

## 2013-05-03 NOTE — Discharge Instructions (Signed)
REturn here as needed. Follow up with your doctor for a recheck. The x-rays here today were normal.

## 2013-05-03 NOTE — ED Notes (Signed)
Pt BIB EMS, reports pt had an un-witnessed fall at 0445, pt was heard falling and family was notified.  Pt was attempting to climb 2 steps back into the house after walking her dog. Pt is known to take Xanax and 30 mg Percocet at night to sleep. Pt has garbled speech and is lethargic, but family states this is her norm at night. PERRL per EMS. Pt in LSB, head blocks and c-collar.

## 2013-05-08 ENCOUNTER — Other Ambulatory Visit: Payer: Self-pay | Admitting: *Deleted

## 2013-05-08 ENCOUNTER — Other Ambulatory Visit: Payer: Self-pay | Admitting: Orthopedic Surgery

## 2013-05-08 DIAGNOSIS — F418 Other specified anxiety disorders: Secondary | ICD-10-CM

## 2013-05-08 DIAGNOSIS — S0990XA Unspecified injury of head, initial encounter: Secondary | ICD-10-CM

## 2013-05-08 MED ORDER — ALPRAZOLAM 2 MG PO TABS: 2.0000 mg | ORAL_TABLET | Freq: Four times a day (QID) | ORAL | Status: DC | PRN

## 2013-05-09 ENCOUNTER — Ambulatory Visit
Admission: RE | Admit: 2013-05-09 | Discharge: 2013-05-09 | Disposition: A | Discharge status: Home / Self Care | Payer: Medicare Other | Source: Ambulatory Visit | Attending: Orthopedic Surgery | Admitting: Orthopedic Surgery

## 2013-05-09 ENCOUNTER — Other Ambulatory Visit: Payer: Self-pay | Admitting: Orthopedic Surgery

## 2013-05-09 DIAGNOSIS — S0990XA Unspecified injury of head, initial encounter: Secondary | ICD-10-CM

## 2013-05-10 ENCOUNTER — Other Ambulatory Visit: Payer: Self-pay | Admitting: Nurse Practitioner

## 2013-05-10 DIAGNOSIS — D510 Vitamin B12 deficiency anemia due to intrinsic factor deficiency: Secondary | ICD-10-CM

## 2013-05-14 ENCOUNTER — Telehealth: Payer: Self-pay | Admitting: Neurology

## 2013-05-16 ENCOUNTER — Ambulatory Visit: Payer: Self-pay | Admitting: Neurology

## 2013-05-22 ENCOUNTER — Ambulatory Visit: Payer: Self-pay | Admitting: Hematology & Oncology

## 2013-05-22 ENCOUNTER — Ambulatory Visit: Payer: Self-pay

## 2013-05-22 ENCOUNTER — Other Ambulatory Visit: Payer: Self-pay | Admitting: Lab

## 2013-05-23 ENCOUNTER — Telehealth: Payer: Self-pay | Admitting: Hematology & Oncology

## 2013-05-23 NOTE — Telephone Encounter (Signed)
Pt aware of 6-22 MD from 5-13 no show. RN aware

## 2013-05-30 ENCOUNTER — Other Ambulatory Visit: Payer: Self-pay | Admitting: Nurse Practitioner

## 2013-05-30 DIAGNOSIS — F418 Other specified anxiety disorders: Secondary | ICD-10-CM

## 2013-05-30 MED ORDER — ALPRAZOLAM 2 MG PO TABS: 2.0000 mg | ORAL_TABLET | Freq: Three times a day (TID) | ORAL | Status: DC | PRN

## 2013-05-30 MED ORDER — ALPRAZOLAM 2 MG PO TABS: 2.0000 mg | ORAL_TABLET | Freq: Four times a day (QID) | ORAL | Status: DC | PRN

## 2013-06-04 NOTE — Telephone Encounter (Signed)
PT SCHEDULED 5/27 WITH DR Anne Hahn

## 2013-06-05 ENCOUNTER — Telehealth: Payer: Self-pay | Admitting: Neurology

## 2013-06-05 ENCOUNTER — Ambulatory Visit: Payer: Self-pay | Admitting: Neurology

## 2013-06-05 NOTE — Telephone Encounter (Signed)
The patient called within several hours of her appointment and canceled.

## 2013-07-01 ENCOUNTER — Ambulatory Visit (HOSPITAL_BASED_OUTPATIENT_CLINIC_OR_DEPARTMENT_OTHER): Payer: Medicare Other

## 2013-07-01 ENCOUNTER — Encounter: Payer: Self-pay | Admitting: Hematology & Oncology

## 2013-07-01 ENCOUNTER — Other Ambulatory Visit (HOSPITAL_BASED_OUTPATIENT_CLINIC_OR_DEPARTMENT_OTHER): Payer: Medicare Other | Admitting: Lab

## 2013-07-01 ENCOUNTER — Ambulatory Visit (HOSPITAL_BASED_OUTPATIENT_CLINIC_OR_DEPARTMENT_OTHER): Payer: Medicare Other | Admitting: Hematology & Oncology

## 2013-07-01 VITALS — BP 106/74 | HR 88 | Temp 98.2°F | Resp 16 | Ht 69.0 in | Wt 236.0 lb

## 2013-07-01 DIAGNOSIS — M810 Age-related osteoporosis without current pathological fracture: Secondary | ICD-10-CM

## 2013-07-01 DIAGNOSIS — E538 Deficiency of other specified B group vitamins: Secondary | ICD-10-CM

## 2013-07-01 DIAGNOSIS — F411 Generalized anxiety disorder: Secondary | ICD-10-CM

## 2013-07-01 DIAGNOSIS — F418 Other specified anxiety disorders: Secondary | ICD-10-CM

## 2013-07-01 DIAGNOSIS — D509 Iron deficiency anemia, unspecified: Secondary | ICD-10-CM

## 2013-07-01 DIAGNOSIS — E039 Hypothyroidism, unspecified: Secondary | ICD-10-CM

## 2013-07-01 DIAGNOSIS — D510 Vitamin B12 deficiency anemia due to intrinsic factor deficiency: Secondary | ICD-10-CM

## 2013-07-01 LAB — CBC WITH DIFFERENTIAL (CANCER CENTER ONLY)
BASO#: 0 10*3/uL (ref 0.0–0.2)
BASO%: 0.3 % (ref 0.0–2.0)
EOS ABS: 0 10*3/uL (ref 0.0–0.5)
EOS%: 0.8 % (ref 0.0–7.0)
HEMATOCRIT: 34.4 % — AB (ref 34.8–46.6)
HGB: 11.4 g/dL — ABNORMAL LOW (ref 11.6–15.9)
LYMPH#: 1 10*3/uL (ref 0.9–3.3)
LYMPH%: 28 % (ref 14.0–48.0)
MCH: 31.1 pg (ref 26.0–34.0)
MCHC: 33.1 g/dL (ref 32.0–36.0)
MCV: 94 fL (ref 81–101)
MONO#: 0.3 10*3/uL (ref 0.1–0.9)
MONO%: 7.2 % (ref 0.0–13.0)
NEUT#: 2.3 10*3/uL (ref 1.5–6.5)
NEUT%: 63.7 % (ref 39.6–80.0)
Platelets: 164 10*3/uL (ref 145–400)
RBC: 3.67 10*6/uL — AB (ref 3.70–5.32)
RDW: 14 % (ref 11.1–15.7)
WBC: 3.6 10*3/uL — AB (ref 3.9–10.0)

## 2013-07-01 LAB — IRON AND TIBC CHCC
%SAT: 40 % (ref 21–57)
Iron: 88 ug/dL (ref 41–142)
TIBC: 218 ug/dL — AB (ref 236–444)
UIBC: 130 ug/dL (ref 120–384)

## 2013-07-01 LAB — VITAMIN B12: Vitamin B-12: 446 pg/mL (ref 211–911)

## 2013-07-01 LAB — CMP (CANCER CENTER ONLY)
ALT(SGPT): 18 U/L (ref 10–47)
AST: 26 U/L (ref 11–38)
Albumin: 3.2 g/dL — ABNORMAL LOW (ref 3.3–5.5)
Alkaline Phosphatase: 80 U/L (ref 26–84)
BILIRUBIN TOTAL: 0.5 mg/dL (ref 0.20–1.60)
BUN, Bld: 21 mg/dL (ref 7–22)
CALCIUM: 8.6 mg/dL (ref 8.0–10.3)
CO2: 23 meq/L (ref 18–33)
CREATININE: 0.9 mg/dL (ref 0.6–1.2)
Chloride: 99 mEq/L (ref 98–108)
GLUCOSE: 85 mg/dL (ref 73–118)
Potassium: 3.9 mEq/L (ref 3.3–4.7)
Sodium: 133 mEq/L (ref 128–145)
TOTAL PROTEIN: 6.2 g/dL — AB (ref 6.4–8.1)

## 2013-07-01 LAB — RETICULOCYTES (CHCC)
ABS Retic: 41.1 10*3/uL (ref 19.0–186.0)
RBC.: 3.74 MIL/uL — ABNORMAL LOW (ref 3.87–5.11)
Retic Ct Pct: 1.1 % (ref 0.4–2.3)

## 2013-07-01 LAB — TSH CHCC: TSH: 0.096 m(IU)/L — ABNORMAL LOW (ref 0.308–3.960)

## 2013-07-01 LAB — FERRITIN CHCC: FERRITIN: 273 ng/mL — AB (ref 9–269)

## 2013-07-01 LAB — CHCC SATELLITE - SMEAR

## 2013-07-01 MED ORDER — ALPRAZOLAM 2 MG PO TABS: 2.0000 mg | ORAL_TABLET | Freq: Three times a day (TID) | ORAL | Status: DC | PRN

## 2013-07-01 MED ORDER — CYANOCOBALAMIN 1000 MCG/ML IJ SOLN
1000.0000 ug | Freq: Once | INTRAMUSCULAR | Status: AC
  Administered 2013-07-01: 1000 ug via INTRAMUSCULAR

## 2013-07-01 MED ORDER — SODIUM CHLORIDE 0.9 % IV SOLN
1020.0000 mg | Freq: Once | INTRAVENOUS | Status: AC
  Administered 2013-07-01: 1020 mg via INTRAVENOUS
  Filled 2013-07-01: qty 34

## 2013-07-01 MED ORDER — METHYLPREDNISOLONE SODIUM SUCC 125 MG IJ SOLR
INTRAMUSCULAR | Status: AC
  Filled 2013-07-01: qty 2

## 2013-07-01 MED ORDER — METHYLPREDNISOLONE SODIUM SUCC 125 MG IJ SOLR
60.0000 mg | Freq: Once | INTRAMUSCULAR | Status: AC
  Administered 2013-07-01: 60 mg via INTRAVENOUS

## 2013-07-01 MED ORDER — ZOLEDRONIC ACID 4 MG/100ML IV SOLN
4.0000 mg | Freq: Once | INTRAVENOUS | Status: AC
  Administered 2013-07-01: 4 mg via INTRAVENOUS
  Filled 2013-07-01: qty 100

## 2013-07-01 MED ORDER — CYANOCOBALAMIN 1000 MCG/ML IJ SOLN
INTRAMUSCULAR | Status: AC
  Filled 2013-07-01: qty 1

## 2013-07-01 NOTE — Patient Instructions (Signed)
Ferumoxytol injection What is this medicine? FERUMOXYTOL is an iron complex. Iron is used to make healthy red blood cells, which carry oxygen and nutrients throughout the body. This medicine is used to treat iron deficiency anemia in people with chronic kidney disease. This medicine may be used for other purposes; ask your health care Matheson Vandehei or pharmacist if you have questions. COMMON BRAND NAME(S): Feraheme What should I tell my health care Janet Decesare before I take this medicine? They need to know if you have any of these conditions: -anemia not caused by low iron levels -high levels of iron in the blood -magnetic resonance imaging (MRI) test scheduled -an unusual or allergic reaction to iron, other medicines, foods, dyes, or preservatives -pregnant or trying to get pregnant -breast-feeding How should I use this medicine? This medicine is for injection into a vein. It is given by a health care professional in a hospital or clinic setting. Talk to your pediatrician regarding the use of this medicine in children. Special care may be needed. Overdosage: If you think you've taken too much of this medicine contact a poison control center or emergency room at once. Overdosage: If you think you have taken too much of this medicine contact a poison control center or emergency room at once. NOTE: This medicine is only for you. Do not share this medicine with others. What if I miss a dose? It is important not to miss your dose. Call your doctor or health care professional if you are unable to keep an appointment. What may interact with this medicine? This medicine may interact with the following medications: -other iron products This list may not describe all possible interactions. Give your health care Dalyah Pla a list of all the medicines, herbs, non-prescription drugs, or dietary supplements you use. Also tell them if you smoke, drink alcohol, or use illegal drugs. Some items may interact with your  medicine. What should I watch for while using this medicine? Visit your doctor or healthcare professional regularly. Tell your doctor or healthcare professional if your symptoms do not start to get better or if they get worse. You may need blood work done while you are taking this medicine. You may need to follow a special diet. Talk to your doctor. Foods that contain iron include: whole grains/cereals, dried fruits, beans, or peas, leafy green vegetables, and organ meats (liver, kidney). What side effects may I notice from receiving this medicine? Side effects that you should report to your doctor or health care professional as soon as possible: -allergic reactions like skin rash, itching or hives, swelling of the face, lips, or tongue -breathing problems -changes in blood pressure -feeling faint or lightheaded, falls -fever or chills -flushing, sweating, or hot feelings -swelling of the ankles or feet Side effects that usually do not require medical attention (Report these to your doctor or health care professional if they continue or are bothersome.): -diarrhea -headache -nausea, vomiting -stomach pain This list may not describe all possible side effects. Call your doctor for medical advice about side effects. You may report side effects to FDA at 1-800-FDA-1088. Where should I keep my medicine? This drug is given in a hospital or clinic and will not be stored at home. NOTE: This sheet is a summary. It may not cover all possible information. If you have questions about this medicine, talk to your doctor, pharmacist, or health care Reola Buckles.  2015, Elsevier/Gold Standard. (2011-08-12 15:23:36)  Zoledronic Acid injection (Hypercalcemia, Oncology) What is this medicine? ZOLEDRONIC ACID (ZOE le   dron ik AS id) lowers the amount of calcium loss from bone. It is used to treat too much calcium in your blood from cancer. It is also used to prevent complications of cancer that has spread to the  bone. This medicine may be used for other purposes; ask your health care Kassondra Geil or pharmacist if you have questions. COMMON BRAND NAME(S): Zometa What should I tell my health care Sreekar Broyhill before I take this medicine? They need to know if you have any of these conditions: -aspirin-sensitive asthma -cancer, especially if you are receiving medicines used to treat cancer -dental disease or wear dentures -infection -kidney disease -receiving corticosteroids like dexamethasone or prednisone -an unusual or allergic reaction to zoledronic acid, other medicines, foods, dyes, or preservatives -pregnant or trying to get pregnant -breast-feeding How should I use this medicine? This medicine is for infusion into a vein. It is given by a health care professional in a hospital or clinic setting. Talk to your pediatrician regarding the use of this medicine in children. Special care may be needed. Overdosage: If you think you have taken too much of this medicine contact a poison control center or emergency room at once. NOTE: This medicine is only for you. Do not share this medicine with others. What if I miss a dose? It is important not to miss your dose. Call your doctor or health care professional if you are unable to keep an appointment. What may interact with this medicine? -certain antibiotics given by injection -NSAIDs, medicines for pain and inflammation, like ibuprofen or naproxen -some diuretics like bumetanide, furosemide -teriparatide -thalidomide This list may not describe all possible interactions. Give your health care Angelos Wasco a list of all the medicines, herbs, non-prescription drugs, or dietary supplements you use. Also tell them if you smoke, drink alcohol, or use illegal drugs. Some items may interact with your medicine. What should I watch for while using this medicine? Visit your doctor or health care professional for regular checkups. It may be some time before you see the  benefit from this medicine. Do not stop taking your medicine unless your doctor tells you to. Your doctor may order blood tests or other tests to see how you are doing. Women should inform their doctor if they wish to become pregnant or think they might be pregnant. There is a potential for serious side effects to an unborn child. Talk to your health care professional or pharmacist for more information. You should make sure that you get enough calcium and vitamin D while you are taking this medicine. Discuss the foods you eat and the vitamins you take with your health care professional. Some people who take this medicine have severe bone, joint, and/or muscle pain. This medicine may also increase your risk for jaw problems or a broken thigh bone. Tell your doctor right away if you have severe pain in your jaw, bones, joints, or muscles. Tell your doctor if you have any pain that does not go away or that gets worse. Tell your dentist and dental surgeon that you are taking this medicine. You should not have major dental surgery while on this medicine. See your dentist to have a dental exam and fix any dental problems before starting this medicine. Take good care of your teeth while on this medicine. Make sure you see your dentist for regular follow-up appointments. What side effects may I notice from receiving this medicine? Side effects that you should report to your doctor or health care professional as soon   as possible: -allergic reactions like skin rash, itching or hives, swelling of the face, lips, or tongue -anxiety, confusion, or depression -breathing problems -changes in vision -eye pain -feeling faint or lightheaded, falls -jaw pain, especially after dental work -mouth sores -muscle cramps, stiffness, or weakness -trouble passing urine or change in the amount of urine Side effects that usually do not require medical attention (report to your doctor or health care professional if they continue  or are bothersome): -bone, joint, or muscle pain -constipation -diarrhea -fever -hair loss -irritation at site where injected -loss of appetite -nausea, vomiting -stomach upset -trouble sleeping -trouble swallowing -weak or tired This list may not describe all possible side effects. Call your doctor for medical advice about side effects. You may report side effects to FDA at 1-800-FDA-1088. Where should I keep my medicine? This drug is given in a hospital or clinic and will not be stored at home. NOTE: This sheet is a summary. It may not cover all possible information. If you have questions about this medicine, talk to your doctor, pharmacist, or health care Armanii Pressnell.  2015, Elsevier/Gold Standard. (2012-06-07 13:03:13)   
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
## 2013-07-02 ENCOUNTER — Telehealth: Payer: Self-pay | Admitting: Hematology & Oncology

## 2013-07-02 NOTE — Telephone Encounter (Signed)
Left message for pt to schedule appointment.

## 2013-07-03 ENCOUNTER — Telehealth: Payer: Self-pay | Admitting: Hematology & Oncology

## 2013-07-03 NOTE — Telephone Encounter (Signed)
Pt aware of 9-21

## 2013-07-11 ENCOUNTER — Ambulatory Visit (HOSPITAL_COMMUNITY): Payer: Medicare Other | Attending: Cardiology | Admitting: Cardiology

## 2013-07-11 ENCOUNTER — Other Ambulatory Visit (HOSPITAL_COMMUNITY): Payer: Self-pay | Admitting: Cardiology

## 2013-07-11 DIAGNOSIS — M79606 Pain in leg, unspecified: Secondary | ICD-10-CM

## 2013-07-11 DIAGNOSIS — Z8489 Family history of other specified conditions: Secondary | ICD-10-CM | POA: Insufficient documentation

## 2013-07-11 DIAGNOSIS — M7989 Other specified soft tissue disorders: Secondary | ICD-10-CM | POA: Insufficient documentation

## 2013-07-11 DIAGNOSIS — M79609 Pain in unspecified limb: Secondary | ICD-10-CM

## 2013-07-11 DIAGNOSIS — E669 Obesity, unspecified: Secondary | ICD-10-CM | POA: Insufficient documentation

## 2013-07-11 DIAGNOSIS — D649 Anemia, unspecified: Secondary | ICD-10-CM | POA: Insufficient documentation

## 2013-07-11 DIAGNOSIS — R609 Edema, unspecified: Secondary | ICD-10-CM | POA: Insufficient documentation

## 2013-07-11 NOTE — Progress Notes (Signed)
Right LE venous duplex performed

## 2013-07-29 ENCOUNTER — Other Ambulatory Visit: Payer: Self-pay | Admitting: *Deleted

## 2013-07-29 DIAGNOSIS — D509 Iron deficiency anemia, unspecified: Secondary | ICD-10-CM

## 2013-07-29 DIAGNOSIS — F418 Other specified anxiety disorders: Secondary | ICD-10-CM

## 2013-07-29 MED ORDER — ALPRAZOLAM 2 MG PO TABS: 2.0000 mg | ORAL_TABLET | Freq: Three times a day (TID) | ORAL | Status: DC | PRN

## 2013-08-05 ENCOUNTER — Ambulatory Visit: Payer: Self-pay | Admitting: Neurology

## 2013-08-05 ENCOUNTER — Encounter: Payer: Self-pay | Admitting: *Deleted

## 2013-08-23 ENCOUNTER — Other Ambulatory Visit: Payer: Self-pay

## 2013-08-23 DIAGNOSIS — D509 Iron deficiency anemia, unspecified: Secondary | ICD-10-CM

## 2013-08-23 DIAGNOSIS — F418 Other specified anxiety disorders: Secondary | ICD-10-CM

## 2013-08-23 MED ORDER — ALPRAZOLAM 2 MG PO TABS
2.0000 mg | ORAL_TABLET | Freq: Three times a day (TID) | ORAL | Status: DC | PRN
Start: 2013-08-23 — End: 2013-09-23

## 2013-09-11 ENCOUNTER — Ambulatory Visit: Payer: Medicare Other

## 2013-09-12 ENCOUNTER — Ambulatory Visit: Payer: Medicare Other

## 2013-09-23 ENCOUNTER — Other Ambulatory Visit: Payer: Self-pay | Admitting: *Deleted

## 2013-09-23 DIAGNOSIS — F418 Other specified anxiety disorders: Secondary | ICD-10-CM

## 2013-09-23 DIAGNOSIS — D509 Iron deficiency anemia, unspecified: Secondary | ICD-10-CM

## 2013-09-23 MED ORDER — ALPRAZOLAM 2 MG PO TABS: 2.0000 mg | ORAL_TABLET | Freq: Three times a day (TID) | ORAL | Status: DC | PRN

## 2013-09-30 ENCOUNTER — Other Ambulatory Visit (HOSPITAL_BASED_OUTPATIENT_CLINIC_OR_DEPARTMENT_OTHER): Payer: Medicare Other | Admitting: Lab

## 2013-09-30 ENCOUNTER — Ambulatory Visit (HOSPITAL_BASED_OUTPATIENT_CLINIC_OR_DEPARTMENT_OTHER): Payer: Medicare Other

## 2013-09-30 ENCOUNTER — Encounter: Payer: Self-pay | Admitting: Hematology & Oncology

## 2013-09-30 ENCOUNTER — Ambulatory Visit (HOSPITAL_BASED_OUTPATIENT_CLINIC_OR_DEPARTMENT_OTHER): Payer: Medicare Other | Admitting: Hematology & Oncology

## 2013-09-30 ENCOUNTER — Other Ambulatory Visit: Payer: Self-pay | Admitting: *Deleted

## 2013-09-30 VITALS — BP 102/67 | HR 79 | Temp 97.5°F | Resp 16 | Ht 69.0 in | Wt 218.0 lb

## 2013-09-30 DIAGNOSIS — D510 Vitamin B12 deficiency anemia due to intrinsic factor deficiency: Secondary | ICD-10-CM

## 2013-09-30 DIAGNOSIS — Z9884 Bariatric surgery status: Secondary | ICD-10-CM

## 2013-09-30 DIAGNOSIS — D509 Iron deficiency anemia, unspecified: Secondary | ICD-10-CM

## 2013-09-30 DIAGNOSIS — F418 Other specified anxiety disorders: Secondary | ICD-10-CM

## 2013-09-30 LAB — CBC WITH DIFFERENTIAL (CANCER CENTER ONLY)
BASO#: 0 10*3/uL (ref 0.0–0.2)
BASO%: 0.3 % (ref 0.0–2.0)
EOS%: 1.1 % (ref 0.0–7.0)
Eosinophils Absolute: 0 10*3/uL (ref 0.0–0.5)
HCT: 32.1 % — ABNORMAL LOW (ref 34.8–46.6)
HGB: 10.3 g/dL — ABNORMAL LOW (ref 11.6–15.9)
LYMPH#: 1.3 10*3/uL (ref 0.9–3.3)
LYMPH%: 34.6 % (ref 14.0–48.0)
MCH: 31.2 pg (ref 26.0–34.0)
MCHC: 32.1 g/dL (ref 32.0–36.0)
MCV: 97 fL (ref 81–101)
MONO#: 0.3 10*3/uL (ref 0.1–0.9)
MONO%: 8.8 % (ref 0.0–13.0)
NEUT#: 2.1 10*3/uL (ref 1.5–6.5)
NEUT%: 55.2 % (ref 39.6–80.0)
PLATELETS: 197 10*3/uL (ref 145–400)
RBC: 3.3 10*6/uL — AB (ref 3.70–5.32)
RDW: 12.8 % (ref 11.1–15.7)
WBC: 3.8 10*3/uL — AB (ref 3.9–10.0)

## 2013-09-30 LAB — CHCC SATELLITE - SMEAR

## 2013-09-30 MED ORDER — DIPHENHYDRAMINE HCL 50 MG/ML IJ SOLN
INTRAMUSCULAR | Status: AC
  Filled 2013-09-30: qty 1

## 2013-09-30 MED ORDER — ALPRAZOLAM 2 MG PO TABS
ORAL_TABLET | ORAL | Status: DC
Start: 2013-09-30 — End: 2013-10-04

## 2013-09-30 MED ORDER — METHYLPREDNISOLONE SODIUM SUCC 40 MG IJ SOLR
20.0000 mg | Freq: Once | INTRAMUSCULAR | Status: AC
  Administered 2013-09-30: 20 mg via INTRAVENOUS

## 2013-09-30 MED ORDER — DIPHENHYDRAMINE HCL 50 MG/ML IJ SOLN
25.0000 mg | Freq: Once | INTRAMUSCULAR | Status: AC
  Administered 2013-09-30: 25 mg via INTRAVENOUS

## 2013-09-30 MED ORDER — SODIUM CHLORIDE 0.9 % IV SOLN
INTRAVENOUS | Status: DC
  Administered 2013-09-30: 16:00:00 via INTRAVENOUS

## 2013-09-30 MED ORDER — SODIUM CHLORIDE 0.9 % IV SOLN
1020.0000 mg | Freq: Once | INTRAVENOUS | Status: AC
  Administered 2013-09-30: 1020 mg via INTRAVENOUS
  Filled 2013-09-30: qty 34

## 2013-09-30 MED ORDER — METHYLPREDNISOLONE SODIUM SUCC 40 MG IJ SOLR
INTRAMUSCULAR | Status: AC
  Filled 2013-09-30: qty 1

## 2013-09-30 MED ORDER — CYANOCOBALAMIN 1000 MCG/ML IJ SOLN
1000.0000 ug | Freq: Once | INTRAMUSCULAR | Status: AC
  Administered 2013-09-30: 1000 ug via INTRAMUSCULAR

## 2013-09-30 MED ORDER — CYANOCOBALAMIN 1000 MCG/ML IJ SOLN
INTRAMUSCULAR | Status: AC
  Filled 2013-09-30: qty 1

## 2013-09-30 NOTE — Patient Instructions (Signed)
Ferumoxytol injection What is this medicine? FERUMOXYTOL is an iron complex. Iron is used to make healthy red blood cells, which carry oxygen and nutrients throughout the body. This medicine is used to treat iron deficiency anemia in people with chronic kidney disease. This medicine may be used for other purposes; ask your health care provider or pharmacist if you have questions. COMMON BRAND NAME(S): Feraheme What should I tell my health care provider before I take this medicine? They need to know if you have any of these conditions: -anemia not caused by low iron levels -high levels of iron in the blood -magnetic resonance imaging (MRI) test scheduled -an unusual or allergic reaction to iron, other medicines, foods, dyes, or preservatives -pregnant or trying to get pregnant -breast-feeding How should I use this medicine? This medicine is for injection into a vein. It is given by a health care professional in a hospital or clinic setting. Talk to your pediatrician regarding the use of this medicine in children. Special care may be needed. Overdosage: If you think you've taken too much of this medicine contact a poison control center or emergency room at once. Overdosage: If you think you have taken too much of this medicine contact a poison control center or emergency room at once. NOTE: This medicine is only for you. Do not share this medicine with others. What if I miss a dose? It is important not to miss your dose. Call your doctor or health care professional if you are unable to keep an appointment. What may interact with this medicine? This medicine may interact with the following medications: -other iron products This list may not describe all possible interactions. Give your health care provider a list of all the medicines, herbs, non-prescription drugs, or dietary supplements you use. Also tell them if you smoke, drink alcohol, or use illegal drugs. Some items may interact with your  medicine. What should I watch for while using this medicine? Visit your doctor or healthcare professional regularly. Tell your doctor or healthcare professional if your symptoms do not start to get better or if they get worse. You may need blood work done while you are taking this medicine. You may need to follow a special diet. Talk to your doctor. Foods that contain iron include: whole grains/cereals, dried fruits, beans, or peas, leafy green vegetables, and organ meats (liver, kidney). What side effects may I notice from receiving this medicine? Side effects that you should report to your doctor or health care professional as soon as possible: -allergic reactions like skin rash, itching or hives, swelling of the face, lips, or tongue -breathing problems -changes in blood pressure -feeling faint or lightheaded, falls -fever or chills -flushing, sweating, or hot feelings -swelling of the ankles or feet Side effects that usually do not require medical attention (Report these to your doctor or health care professional if they continue or are bothersome.): -diarrhea -headache -nausea, vomiting -stomach pain This list may not describe all possible side effects. Call your doctor for medical advice about side effects. You may report side effects to FDA at 1-800-FDA-1088. Where should I keep my medicine? This drug is given in a hospital or clinic and will not be stored at home. NOTE: This sheet is a summary. It may not cover all possible information. If you have questions about this medicine, talk to your doctor, pharmacist, or health care provider.  2015, Elsevier/Gold Standard. (2011-08-12 15:23:36) Cyanocobalamin, Vitamin B12 injection What is this medicine? CYANOCOBALAMIN (sye an oh koe BAL   a min) is a man made form of vitamin B12. Vitamin B12 is used in the growth of healthy blood cells, nerve cells, and proteins in the body. It also helps with the metabolism of fats and carbohydrates. This  medicine is used to treat people who can not absorb vitamin B12. This medicine may be used for other purposes; ask your health care provider or pharmacist if you have questions. COMMON BRAND NAME(S): Cyomin, LA-12, Nutri-Twelve, Primabalt What should I tell my health care provider before I take this medicine? They need to know if you have any of these conditions: -kidney disease -Leber's disease -megaloblastic anemia -an unusual or allergic reaction to cyanocobalamin, cobalt, other medicines, foods, dyes, or preservatives -pregnant or trying to get pregnant -breast-feeding How should I use this medicine? This medicine is injected into a muscle or deeply under the skin. It is usually given by a health care professional in a clinic or doctor's office. However, your doctor may teach you how to inject yourself. Follow all instructions. Talk to your pediatrician regarding the use of this medicine in children. Special care may be needed. Overdosage: If you think you have taken too much of this medicine contact a poison control center or emergency room at once. NOTE: This medicine is only for you. Do not share this medicine with others. What if I miss a dose? If you are given your dose at a clinic or doctor's office, call to reschedule your appointment. If you give your own injections and you miss a dose, take it as soon as you can. If it is almost time for your next dose, take only that dose. Do not take double or extra doses. What may interact with this medicine? -colchicine -heavy alcohol intake This list may not describe all possible interactions. Give your health care provider a list of all the medicines, herbs, non-prescription drugs, or dietary supplements you use. Also tell them if you smoke, drink alcohol, or use illegal drugs. Some items may interact with your medicine. What should I watch for while using this medicine? Visit your doctor or health care professional regularly. You may need  blood work done while you are taking this medicine. You may need to follow a special diet. Talk to your doctor. Limit your alcohol intake and avoid smoking to get the best benefit. What side effects may I notice from receiving this medicine? Side effects that you should report to your doctor or health care professional as soon as possible: -allergic reactions like skin rash, itching or hives, swelling of the face, lips, or tongue -blue tint to skin -chest tightness, pain -difficulty breathing, wheezing -dizziness -red, swollen painful area on the leg Side effects that usually do not require medical attention (report to your doctor or health care professional if they continue or are bothersome): -diarrhea -headache This list may not describe all possible side effects. Call your doctor for medical advice about side effects. You may report side effects to FDA at 1-800-FDA-1088. Where should I keep my medicine? Keep out of the reach of children. Store at room temperature between 15 and 30 degrees C (59 and 85 degrees F). Protect from light. Throw away any unused medicine after the expiration date. NOTE: This sheet is a summary. It may not cover all possible information. If you have questions about this medicine, talk to your doctor, pharmacist, or health care provider.  2015, Elsevier/Gold Standard. (2007-04-09 22:10:20)  

## 2013-10-01 LAB — IRON AND TIBC CHCC
%SAT: 50 % (ref 21–57)
IRON: 99 ug/dL (ref 41–142)
TIBC: 196 ug/dL — ABNORMAL LOW (ref 236–444)
UIBC: 98 ug/dL — AB (ref 120–384)

## 2013-10-01 LAB — FERRITIN CHCC: Ferritin: 488 ng/ml — ABNORMAL HIGH (ref 9–269)
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
## 2013-10-02 ENCOUNTER — Telehealth: Payer: Self-pay | Admitting: Hematology & Oncology

## 2013-10-02 NOTE — Telephone Encounter (Signed)
Mailed dec schedule °

## 2013-10-04 ENCOUNTER — Inpatient Hospital Stay (HOSPITAL_COMMUNITY)
Admission: EM | Admit: 2013-10-04 | Discharge: 2013-10-08 | DRG: 645 | Disposition: A | Discharge status: Home / Self Care | Payer: Medicare Other | Attending: Internal Medicine | Admitting: Internal Medicine

## 2013-10-04 ENCOUNTER — Encounter (HOSPITAL_COMMUNITY): Payer: Self-pay | Admitting: Emergency Medicine

## 2013-10-04 DIAGNOSIS — Z9884 Bariatric surgery status: Secondary | ICD-10-CM | POA: Diagnosis not present

## 2013-10-04 DIAGNOSIS — I8390 Asymptomatic varicose veins of unspecified lower extremity: Secondary | ICD-10-CM | POA: Diagnosis present

## 2013-10-04 DIAGNOSIS — F411 Generalized anxiety disorder: Secondary | ICD-10-CM | POA: Diagnosis present

## 2013-10-04 DIAGNOSIS — F319 Bipolar disorder, unspecified: Secondary | ICD-10-CM | POA: Diagnosis present

## 2013-10-04 DIAGNOSIS — G43909 Migraine, unspecified, not intractable, without status migrainosus: Secondary | ICD-10-CM | POA: Diagnosis present

## 2013-10-04 DIAGNOSIS — G4730 Sleep apnea, unspecified: Secondary | ICD-10-CM | POA: Diagnosis present

## 2013-10-04 DIAGNOSIS — E538 Deficiency of other specified B group vitamins: Secondary | ICD-10-CM | POA: Diagnosis present

## 2013-10-04 DIAGNOSIS — G8929 Other chronic pain: Secondary | ICD-10-CM | POA: Diagnosis present

## 2013-10-04 DIAGNOSIS — M797 Fibromyalgia: Secondary | ICD-10-CM | POA: Diagnosis present

## 2013-10-04 DIAGNOSIS — Z981 Arthrodesis status: Secondary | ICD-10-CM | POA: Diagnosis not present

## 2013-10-04 DIAGNOSIS — D510 Vitamin B12 deficiency anemia due to intrinsic factor deficiency: Secondary | ICD-10-CM | POA: Diagnosis present

## 2013-10-04 DIAGNOSIS — E039 Hypothyroidism, unspecified: Secondary | ICD-10-CM | POA: Diagnosis present

## 2013-10-04 DIAGNOSIS — E236 Other disorders of pituitary gland: Secondary | ICD-10-CM | POA: Diagnosis present

## 2013-10-04 DIAGNOSIS — E222 Syndrome of inappropriate secretion of antidiuretic hormone: Secondary | ICD-10-CM | POA: Diagnosis present

## 2013-10-04 DIAGNOSIS — E876 Hypokalemia: Secondary | ICD-10-CM | POA: Diagnosis present

## 2013-10-04 DIAGNOSIS — Z79899 Other long term (current) drug therapy: Secondary | ICD-10-CM

## 2013-10-04 DIAGNOSIS — I959 Hypotension, unspecified: Secondary | ICD-10-CM | POA: Diagnosis present

## 2013-10-04 DIAGNOSIS — E1169 Type 2 diabetes mellitus with other specified complication: Secondary | ICD-10-CM | POA: Diagnosis present

## 2013-10-04 DIAGNOSIS — G4733 Obstructive sleep apnea (adult) (pediatric): Secondary | ICD-10-CM | POA: Diagnosis present

## 2013-10-04 DIAGNOSIS — R51 Headache: Secondary | ICD-10-CM

## 2013-10-04 DIAGNOSIS — M129 Arthropathy, unspecified: Secondary | ICD-10-CM | POA: Diagnosis present

## 2013-10-04 DIAGNOSIS — M810 Age-related osteoporosis without current pathological fracture: Secondary | ICD-10-CM | POA: Diagnosis present

## 2013-10-04 DIAGNOSIS — IMO0001 Reserved for inherently not codable concepts without codable children: Secondary | ICD-10-CM | POA: Diagnosis present

## 2013-10-04 DIAGNOSIS — E860 Dehydration: Secondary | ICD-10-CM | POA: Diagnosis present

## 2013-10-04 DIAGNOSIS — E871 Hypo-osmolality and hyponatremia: Secondary | ICD-10-CM | POA: Diagnosis present

## 2013-10-04 DIAGNOSIS — D509 Iron deficiency anemia, unspecified: Secondary | ICD-10-CM | POA: Diagnosis present

## 2013-10-04 DIAGNOSIS — F341 Dysthymic disorder: Secondary | ICD-10-CM

## 2013-10-04 DIAGNOSIS — R519 Headache, unspecified: Secondary | ICD-10-CM

## 2013-10-04 DIAGNOSIS — F418 Other specified anxiety disorders: Secondary | ICD-10-CM

## 2013-10-04 LAB — BASIC METABOLIC PANEL
ANION GAP: 14 (ref 5–15)
BUN: 12 mg/dL (ref 6–23)
CALCIUM: 7.9 mg/dL — AB (ref 8.4–10.5)
CHLORIDE: 88 meq/L — AB (ref 96–112)
CO2: 17 mEq/L — ABNORMAL LOW (ref 19–32)
Creatinine, Ser: 0.65 mg/dL (ref 0.50–1.10)
GFR calc Af Amer: >90 mL/min (ref >90)
GFR calc non Af Amer: >90 mL/min (ref >90)
Glucose, Bld: 91 mg/dL (ref 70–99)
Potassium: 3.9 mEq/L (ref 3.7–5.3)
Sodium: 119 mEq/L — CL (ref 137–147)

## 2013-10-04 LAB — COMPREHENSIVE METABOLIC PANEL
ALBUMIN: 3.8 g/dL (ref 3.5–5.2)
ALK PHOS: 83 U/L (ref 39–117)
ALT: 19 U/L (ref 0–35)
AST: 14 U/L (ref 0–37)
Anion gap: 16 — ABNORMAL HIGH (ref 5–15)
BUN: 16 mg/dL (ref 6–23)
CHLORIDE: 87 meq/L — AB (ref 96–112)
CO2: 19 mEq/L (ref 19–32)
Calcium: 8.6 mg/dL (ref 8.4–10.5)
Creatinine, Ser: 0.85 mg/dL (ref 0.50–1.10)
GFR calc Af Amer: 89 mL/min — ABNORMAL LOW (ref >90)
GFR calc non Af Amer: 77 mL/min — ABNORMAL LOW (ref >90)
Glucose, Bld: 83 mg/dL (ref 70–99)
Potassium: 3.9 mEq/L (ref 3.7–5.3)
SODIUM: 122 meq/L — AB (ref 137–147)
Total Bilirubin: 0.2 mg/dL — ABNORMAL LOW (ref 0.3–1.2)
Total Protein: 7.1 g/dL (ref 6.0–8.3)

## 2013-10-04 LAB — NA AND K (SODIUM & POTASSIUM), RAND UR
Potassium Urine: 11 mEq/L
Sodium, Ur: 109 mEq/L

## 2013-10-04 LAB — URINALYSIS, ROUTINE W REFLEX MICROSCOPIC
Bilirubin Urine: NEGATIVE
GLUCOSE, UA: NEGATIVE mg/dL
HGB URINE DIPSTICK: NEGATIVE
Ketones, ur: NEGATIVE mg/dL
LEUKOCYTES UA: NEGATIVE
Nitrite: NEGATIVE
PROTEIN: NEGATIVE mg/dL
SPECIFIC GRAVITY, URINE: 1.01 (ref 1.005–1.030)
Urobilinogen, UA: 0.2 mg/dL (ref 0.0–1.0)
pH: 6 (ref 5.0–8.0)

## 2013-10-04 LAB — CBC
HCT: 34.6 % — ABNORMAL LOW (ref 36.0–46.0)
Hemoglobin: 11.7 g/dL — ABNORMAL LOW (ref 12.0–15.0)
MCH: 30.6 pg (ref 26.0–34.0)
MCHC: 33.8 g/dL (ref 30.0–36.0)
MCV: 90.6 fL (ref 78.0–100.0)
Platelets: 219 10*3/uL (ref 150–400)
RBC: 3.82 MIL/uL — ABNORMAL LOW (ref 3.87–5.11)
RDW: 12.8 % (ref 11.5–15.5)
WBC: 6.3 10*3/uL (ref 4.0–10.5)

## 2013-10-04 LAB — GLUCOSE, CAPILLARY
GLUCOSE-CAPILLARY: 99 mg/dL (ref 70–99)
Glucose-Capillary: 115 mg/dL — ABNORMAL HIGH (ref 70–99)

## 2013-10-04 LAB — VITAMIN B12: VITAMIN B 12: 843 pg/mL (ref 211–911)

## 2013-10-04 LAB — TSH: TSH: 0.202 u[IU]/mL — ABNORMAL LOW (ref 0.350–4.500)

## 2013-10-04 MED ORDER — SODIUM CHLORIDE 0.9 % IV SOLN
INTRAVENOUS | Status: DC
  Administered 2013-10-04: 17:00:00 via INTRAVENOUS

## 2013-10-04 MED ORDER — ALUM & MAG HYDROXIDE-SIMETH 200-200-20 MG/5ML PO SUSP: 30.0000 mL | Freq: Four times a day (QID) | ORAL | Status: DC | PRN

## 2013-10-04 MED ORDER — LORAZEPAM 2 MG/ML IJ SOLN
1.0000 mg | Freq: Once | INTRAMUSCULAR | Status: AC
  Administered 2013-10-04: 1 mg via INTRAVENOUS
  Filled 2013-10-04: qty 1

## 2013-10-04 MED ORDER — HYDROCODONE-ACETAMINOPHEN 5-325 MG PO TABS
1.0000 | ORAL_TABLET | ORAL | Status: DC | PRN
  Administered 2013-10-04 – 2013-10-05 (×3): 2 via ORAL
  Filled 2013-10-04 (×3): qty 2

## 2013-10-04 MED ORDER — CHLORHEXIDINE GLUCONATE 0.12 % MT SOLN
15.0000 mL | Freq: Two times a day (BID) | OROMUCOSAL | Status: DC
  Administered 2013-10-04 – 2013-10-08 (×8): 15 mL via OROMUCOSAL
  Filled 2013-10-04 (×10): qty 15

## 2013-10-04 MED ORDER — POLYETHYLENE GLYCOL 3350 17 G PO PACK
17.0000 g | PACK | Freq: Every day | ORAL | Status: DC | PRN
  Filled 2013-10-04: qty 1

## 2013-10-04 MED ORDER — PREDNISONE (PAK) 10 MG PO TABS: 10.0000 mg | ORAL_TABLET | Freq: Four times a day (QID) | ORAL | Status: DC

## 2013-10-04 MED ORDER — GUAIFENESIN-DM 100-10 MG/5ML PO SYRP: 5.0000 mL | ORAL_SOLUTION | ORAL | Status: DC | PRN

## 2013-10-04 MED ORDER — PREDNISONE 20 MG PO TABS
20.0000 mg | ORAL_TABLET | Freq: Every day | ORAL | Status: DC
  Filled 2013-10-04: qty 1

## 2013-10-04 MED ORDER — HEPARIN SODIUM (PORCINE) 5000 UNIT/ML IJ SOLN
5000.0000 [IU] | Freq: Three times a day (TID) | INTRAMUSCULAR | Status: DC
  Administered 2013-10-04 – 2013-10-08 (×10): 5000 [IU] via SUBCUTANEOUS
  Filled 2013-10-04 (×14): qty 1

## 2013-10-04 MED ORDER — LORATADINE 10 MG PO TABS
10.0000 mg | ORAL_TABLET | Freq: Two times a day (BID) | ORAL | Status: DC
  Administered 2013-10-04 – 2013-10-08 (×8): 10 mg via ORAL
  Filled 2013-10-04 (×10): qty 1

## 2013-10-04 MED ORDER — LEVOTHYROXINE SODIUM 112 MCG PO TABS
224.0000 ug | ORAL_TABLET | ORAL | Status: DC
  Filled 2013-10-04: qty 2

## 2013-10-04 MED ORDER — CETYLPYRIDINIUM CHLORIDE 0.05 % MT LIQD
7.0000 mL | Freq: Two times a day (BID) | OROMUCOSAL | Status: DC
  Administered 2013-10-05 – 2013-10-06 (×4): 7 mL via OROMUCOSAL

## 2013-10-04 MED ORDER — LEVOTHYROXINE SODIUM 112 MCG PO TABS: 112.0000 ug | ORAL_TABLET | Freq: Every day | ORAL | Status: DC

## 2013-10-04 MED ORDER — PROMETHAZINE HCL 25 MG/ML IJ SOLN
25.0000 mg | Freq: Once | INTRAMUSCULAR | Status: AC
  Administered 2013-10-04: 25 mg via INTRAVENOUS
  Filled 2013-10-04: qty 1

## 2013-10-04 MED ORDER — PREDNISONE 10 MG PO TABS: 10.0000 mg | ORAL_TABLET | Freq: Every day | ORAL | Status: DC

## 2013-10-04 MED ORDER — ALPRAZOLAM 0.5 MG PO TABS
0.5000 mg | ORAL_TABLET | Freq: Two times a day (BID) | ORAL | Status: DC
  Administered 2013-10-04 – 2013-10-05 (×2): 0.5 mg via ORAL
  Filled 2013-10-04 (×2): qty 1

## 2013-10-04 MED ORDER — INSULIN ASPART 100 UNIT/ML ~~LOC~~ SOLN
0.0000 [IU] | Freq: Three times a day (TID) | SUBCUTANEOUS | Status: DC
  Administered 2013-10-05: 2 [IU] via SUBCUTANEOUS

## 2013-10-04 MED ORDER — ONDANSETRON HCL 4 MG/2ML IJ SOLN
4.0000 mg | Freq: Four times a day (QID) | INTRAMUSCULAR | Status: DC | PRN
  Administered 2013-10-05: 4 mg via INTRAVENOUS
  Filled 2013-10-04: qty 2

## 2013-10-04 MED ORDER — ALPRAZOLAM 1 MG PO TABS
1.0000 mg | ORAL_TABLET | Freq: Once | ORAL | Status: AC
  Administered 2013-10-05: 1 mg via ORAL
  Filled 2013-10-04 (×2): qty 1

## 2013-10-04 MED ORDER — LEVOTHYROXINE SODIUM 112 MCG PO TABS: 112.0000 ug | ORAL_TABLET | ORAL | Status: DC

## 2013-10-04 MED ORDER — TOPIRAMATE 100 MG PO TABS
100.0000 mg | ORAL_TABLET | Freq: Every day | ORAL | Status: DC
  Administered 2013-10-04 – 2013-10-07 (×4): 100 mg via ORAL
  Filled 2013-10-04 (×5): qty 1

## 2013-10-04 MED ORDER — PREDNISONE 20 MG PO TABS
30.0000 mg | ORAL_TABLET | Freq: Every day | ORAL | Status: AC
  Administered 2013-10-07 – 2013-10-08 (×2): 30 mg via ORAL
  Filled 2013-10-04 (×2): qty 1

## 2013-10-04 MED ORDER — ALPRAZOLAM 1 MG PO TABS: 1.0000 mg | ORAL_TABLET | Freq: Every evening | ORAL | Status: DC | PRN

## 2013-10-04 MED ORDER — PANTOPRAZOLE SODIUM 40 MG PO TBEC
40.0000 mg | DELAYED_RELEASE_TABLET | Freq: Every day | ORAL | Status: DC
  Administered 2013-10-04 – 2013-10-08 (×5): 40 mg via ORAL
  Filled 2013-10-04 (×5): qty 1

## 2013-10-04 MED ORDER — CYCLOBENZAPRINE HCL 10 MG PO TABS
10.0000 mg | ORAL_TABLET | Freq: Three times a day (TID) | ORAL | Status: DC | PRN
  Administered 2013-10-04 – 2013-10-07 (×6): 10 mg via ORAL
  Filled 2013-10-04 (×6): qty 1

## 2013-10-04 MED ORDER — ONDANSETRON HCL 4 MG PO TABS
4.0000 mg | ORAL_TABLET | Freq: Four times a day (QID) | ORAL | Status: DC | PRN
  Administered 2013-10-06 – 2013-10-07 (×2): 4 mg via ORAL
  Filled 2013-10-04 (×2): qty 1

## 2013-10-04 MED ORDER — DIPHENHYDRAMINE HCL 50 MG/ML IJ SOLN
12.5000 mg | Freq: Once | INTRAMUSCULAR | Status: AC
  Administered 2013-10-04: 12.5 mg via INTRAVENOUS
  Filled 2013-10-04: qty 1

## 2013-10-04 MED ORDER — INSULIN ASPART 100 UNIT/ML ~~LOC~~ SOLN: 0.0000 [IU] | Freq: Every day | SUBCUTANEOUS | Status: DC

## 2013-10-04 MED ORDER — PREDNISONE (PAK) 10 MG PO TABS: ORAL_TABLET | ORAL | Status: DC

## 2013-10-04 MED ORDER — PREDNISONE 20 MG PO TABS
40.0000 mg | ORAL_TABLET | Freq: Every day | ORAL | Status: AC
  Administered 2013-10-05 – 2013-10-06 (×2): 40 mg via ORAL
  Filled 2013-10-04 (×2): qty 2

## 2013-10-04 NOTE — ED Notes (Signed)
Lavenia Atlas, NT performed In and out cath.
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
## 2013-10-04 NOTE — ED Notes (Signed)
Pt c/o migraine x 5 days, BLE muscle spasms x 3 days, and BUE muscle spasms starting today.  Pain score 10/10.  Pt reports taking migraine medication w/o relief.  Pt has appointment w/ neurologist in two months.  Pt reports being under a lot of stress recently.

## 2013-10-04 NOTE — H&P (Signed)
Patient Demographics  Valerie Silva, is a 53 y.o. female  MRN: [id number]   DOB - 1960-02-05  Admit Date - 10/04/2013  Outpatient Primary MD for the patient is [name], MD   With History of -  Past Medical History  Diagnosis Date  . Osteoporosis   . Hypothyroidism   . Diabetes mellitus   . Exogenous obesity   . Rhinitis   . Arthritis   . Fibromyalgia   . Somnolence   . Sleep apnea   . Anemia, iron deficiency 04/06/2011  . Pernicious anemia 04/06/2011  . Depression   . Anxiety   . Iron deficiency anemia, unspecified 12/12/2012      Past Surgical History  Procedure Laterality Date  . Gastric bypass    . Cholecystectomy    . Tracheostomy    . Uvulectomy    . Tonsillectomy    . Mandible reconstruction    . Spinal fusion    . Dilation and curettage of uterus    . Cesarean section      in for   Chief Complaint  Patient presents with  . Migraine  . Spasms     HPI  Valerie Silva  is a 53 y.o. female, with history of bipolar disorder, extreme anxiety, gastric bypass surgery causing iron and B12 deficiency follows with Dr. [name] for that, chronic headaches and back pain, type 2 diabetes mellitus, hypothyroidism, osteoporosis, fibromyalgia, who is recently retired couple of many friends, contracted STD and was treated with appropriate antibiotics a few days ago. Has been having some psychological stress at home. Came to the ER today for migraine and some muscle spasms, in the ER workup was unremarkable but instantly sodium was found to be 122 and I was called to it but the patient.   She currently besides generalized headaches, some muscle spasms which are diffuse, and anxiety has no subjective complaints. She denies any change in medications, denies any nausea vomiting diarrhea, does say that she  does not eat or drink properly due to stress. No other subjective complaints.    Review of Systems    In addition to the HPI above,  No Fever-chills, Chronic generalized Headache, No changes with Vision or hearing, No problems swallowing food or Liquids, No Chest pain, Cough or Shortness of Breath, No Abdominal pain, No Nausea or Vommitting, Bowel movements are regular, No Blood in stool or Urine, No dysuria, No new skin rashes or bruises, No new joints pains-aches,  No new weakness, tingling, numbness in any extremity, No recent weight gain or loss, No polyuria, polydypsia or polyphagia, No significant Mental Stressors.  A full 10 point Review of Systems was done, except as stated above, all other Review of Systems were negative.   Social History History  Substance Use Topics  . Smoking status: Never Smoker   . Smokeless tobacco: Never Used     Comment: never used tobacco  . Alcohol Use: Yes  Comment: one mixed drink twice yearly      Family History Family History  Problem Relation Age of Onset  . CAD Father   . Diabetes Father   . CAD Brother      X3  . Hypertension Brother   . Colon cancer Brother       Prior to Admission medications   Medication Sig Start Date End Date Taking? Authorizing Provider  acarbose (PRECOSE) 25 MG tablet Take 25 mg by mouth daily as needed (for diabetes as indicated by prescriber.).  10/09/12  Yes Historical Provider, MD  alprazolam Prudy Feeler) 2 MG tablet Take 2-4 mg by mouth 2 (two) times daily as needed for sleep (Take  once daily as needed and 4 mg at bedtime as needed sleep/anxiety.).   Yes Historical Provider, MD  Cholecalciferol (VITAMIN D3) 10000 UNITS capsule Take 10,000 Units by mouth daily.   Yes Historical Provider, MD  Cyanocobalamin (VITAMIN B-12 IJ) Inject as directed every 28 (twenty-eight) days.   Yes Historical Provider, MD  cyclobenzaprine (FLEXERIL) 10 MG tablet Take 10 mg by mouth 3 (three) times daily as needed  for muscle spasms.   Yes Historical Provider, MD  diclofenac (FLECTOR) 1.3 % PTCH Place 2 patches onto the skin 2 (two) times daily. 07/13/12  Yes Fransisca Kaufmann, NP  diclofenac (VOLTAREN) 75 MG EC tablet Take 75 mg by mouth 2 (two) times daily.  07/16/12  Yes Historical Provider, MD  escitalopram (LEXAPRO) 20 MG tablet Take 1 tablet (20 mg total) by mouth daily. For depression and anxiety. 07/13/12  Yes Fransisca Kaufmann, NP  ibuprofen (ADVIL,MOTRIN) 800 MG tablet Take 800 mg by mouth every 6 (six) hours as needed for pain.   Yes Historical Provider, MD  levothyroxine (SYNTHROID) 112 MCG tablet Take 112-224 mcg by mouth daily before breakfast. TAKES 2 TABS = 224 MCG. EVERY DAY EXCEPT Monday-----Monday TAKES 1 TAB   Yes Historical Provider, MD  loratadine (CLARITIN) 10 MG tablet Take 1 tablet (10 mg total) by mouth 2 (two) times daily. 07/13/12  Yes Fransisca Kaufmann, NP  modafinil (PROVIGIL) 200 MG tablet Take 400 mg by mouth daily. 2 tabs = 400 mg. 11/19/12  Yes Historical Provider, MD  omeprazole (PRILOSEC) 40 MG capsule Take 1 capsule (40 mg total) by mouth daily. 07/13/12  Yes Fransisca Kaufmann, NP  OxyCODONE HCl ER 30 MG T12A Take 30 mg by mouth 3 (three) times daily. 08/06/12  Yes Josph Macho, MD  oxyCODONE-acetaminophen (PERCOCET) 10-325 MG per tablet Take 1 tablet by mouth 4 (four) times daily. 08/06/12  Yes Josph Macho, MD  predniSONE (STERAPRED UNI-PAK) 10 MG tablet Take by mouth as directed. Prednisone double strength 12 day pack.  Taper as directed per package.   Yes Historical Provider, MD  promethazine (PHENERGAN) 25 MG tablet Take 25 mg by mouth every 8 (eight) hours as needed. nausea   Yes Historical Provider, MD  SUMAtriptan (IMITREX) 100 MG tablet Take 100 mg by mouth every 2 (two) hours as needed for migraine.  05/01/12  Yes Historical Provider, MD  topiramate (TOPAMAX) 200 MG tablet Take 1 tablet (200 mg total) by mouth at bedtime. 07/13/12  Yes Fransisca Kaufmann, NP  triamterene-hydrochlorothiazide (MAXZIDE)  75-50 MG per tablet Take 1 tablet by mouth daily.  07/28/12  Yes Historical Provider, MD  valACYclovir (VALTREX) 1000 MG tablet Take 1,000 mg by mouth as needed (for flare ups).  08/16/11  Yes Historical Provider, MD    Allergies  Allergen Reactions  . Iodine  Other (See Comments)    Sensitive when used gynecologically (intra-vaginally)  . Iron Itching    Iron taken by mouth causes this reaction the patient has had several iron infusions plus Feraheme that has not caused any side effects  . Lovaza [Omega-3-Acid Ethyl Esters]     Itching.  Tresa Garter [Carisoprodol]     Panic attack  . Sulfonamide Derivatives Other (See Comments)    Sensitive when used intra-vaginally.  . Benazepril Hcl     REACTION: severe cough  . Benazepril Hcl Cough  . Codeine     REACTION: AMS, feeling of passing out  . Penicillins     REACTION: severe family allergy.  Family physician long ago stated she shouldn't take this med since mother and brother nearly died from anaphylaxis.  Marland Kitchen Zolpidem Tartrate Itching    Black outs  . Amitriptyline     Sleep walking  . Lotensin [Benazepril]     cough    Physical Exam  Vitals  Blood pressure 129/84, pulse 89, temperature 97.8 F (36.6 C), temperature source Oral, resp. rate 16, SpO2 98.00%.   1. General middle-aged white female lying in bed in NAD,    2. anxious affect and insight, Not Suicidal or Homicidal, Awake Alert, Oriented X 3.  3. No F.N deficits, ALL C.Nerves Intact, Strength 5/5 all 4 extremities, Sensation intact all 4 extremities, Plantars down going.  4. Ears and Eyes appear Normal, Conjunctivae clear, PERRLA. Moist Oral Mucosa.  5. Supple Neck, No JVD, No cervical lymphadenopathy appriciated, No Carotid Bruits.  6. Symmetrical Chest wall movement, Good air movement bilaterally, CTAB.  7. RRR, No Gallops, Rubs or Murmurs, No Parasternal Heave.  8. Positive Bowel Sounds, Abdomen Soft, No tenderness, No organomegaly appriciated,No rebound -guarding  or rigidity.  9.  No Cyanosis, decreased Skin Turgor, No Skin Rash or Bruise.  10. Good muscle tone,  joints appear normal , no effusions, Normal ROM.  11. No Palpable Lymph Nodes in Neck or Axillae     Data Review  CBC  Recent Labs Lab 09/30/13 1326 10/04/13 1336  WBC 3.8* 6.3  HGB 10.3* 11.7*  HCT 32.1* 34.6*  PLT 197 219  MCV 97 90.6  MCH 31.2 30.6  MCHC 32.1 33.8  RDW 12.8 12.8  LYMPHSABS 1.3  --   EOSABS 0.0  --   BASOSABS 0.0  --    ------------------------------------------------------------------------------------------------------------------  Chemistries   Recent Labs Lab 10/04/13 1336  NA 122*  K 3.9  CL 87*  CO2 19  GLUCOSE 83  BUN 16  CREATININE 0.85  CALCIUM 8.6  AST 14  ALT 19  ALKPHOS 83  BILITOT 0.2*   ------------------------------------------------------------------------------------------------------------------ CrCl is unknown because both a height and weight (above a minimum accepted value) are required for this calculation. ------------------------------------------------------------------------------------------------------------------ No results found for this basename: TSH, T4TOTAL, FREET3, T3FREE, THYROIDAB,  in the last 72 hours   Coagulation profile No results found for this basename: INR, PROTIME,  in the last 168 hours ------------------------------------------------------------------------------------------------------------------- No results found for this basename: DDIMER,  in the last 72 hours -------------------------------------------------------------------------------------------------------------------  Cardiac Enzymes No results found for this basename: CK, CKMB, TROPONINI, MYOGLOBIN,  in the last 168 hours ------------------------------------------------------------------------------------------------------------------ No components found with this basename: POCBNP,     ---------------------------------------------------------------------------------------------------------------  Urinalysis    Component Value Date/Time   COLORURINE YELLOW 07/07/2012 1917   APPEARANCEUR CLEAR 07/07/2012 1917   LABSPEC >1.030* 07/07/2012 1917   PHURINE 6.0 07/07/2012 1917   GLUCOSEU NEGATIVE 07/07/2012 1917  HGBUR NEGATIVE 07/07/2012 1917   BILIRUBINUR NEGATIVE 07/07/2012 1917   KETONESUR NEGATIVE 07/07/2012 1917   PROTEINUR NEGATIVE 07/07/2012 1917   UROBILINOGEN 1.0 07/07/2012 1917   NITRITE NEGATIVE 07/07/2012 1917   LEUKOCYTESUR NEGATIVE 07/07/2012 1917    ----------------------------------------------------------------------------------------------------------------  Imaging results:   No results found.       Assessment & Plan   1. Hyponatremia - patient on exam appears mildly dehydrated, she is decreased skin turgor, unfortunately I do not have serum osmolality or any urine electrolytes, for now we'll start her on gentle normal saline, repeat BMP in 6 hours and then again in the morning, hold diuretic, hold Lexapro which cause SIADH, check serum and urine osmolality along with urine electrolytes.    2. Hypothyroidism. Check TSH continue home dose Synthroid.    3. History of pernicious anemia and deficiency anemia. No acute issues outpatient followup with Dr. [name] to continue.    4. Fibromyalgia, extreme anxiety, bipolar disorder. Will continue on lower dose Topamax, hold Lexapro as can worsen SIDH minimize sedatives and narcotics, she has history of benzodiazepine overdose in the past.    5. Muscle spasms. On Flexeril continue at a lower dose. Monitor electrolytes.    6. On Medrol Dosepak. She does not clearly remember why. Continue, check random cortisol.    7.DM-2 - check A1c, ISS      DVT Prophylaxis Heparin    AM Labs Ordered, also please review Full Orders  Family Communication: Admission, patients condition and plan of care  including tests being ordered have been discussed with the patient and sonwho indicate understanding and agree with the plan and Code Status.  Code Status full  Likely DC to  home  Condition fair  Time spent in minutes : 35    SINGH,PRASHANT K M.D on 10/04/2013 at 4:22 PM  Between 7am to 7pm - Pager - [phone]  After 7pm go to [email or web address] - password [email or web address]  And look for the night coverage person covering me after hours  [facility] Office  [phone]   **Disclaimer: This note may have been dictated with voice recognition software. Similar sounding words can inadvertently be transcribed and this note may contain transcription errors which may not have been corrected upon publication of note.**

## 2013-10-04 NOTE — ED Provider Notes (Signed)
53 y.o.female complaining of muscle spasms and headache. PE WDWN female Ceasar Mons Vitals:   10/04/13 1252  BP: 129/84  Pulse: 89  Temp: 97.8 F (36.6 C)  Resp: 16   nad Lungs cta cv- regular rate and rhythm A and o x 3, cn 2-12 grossly intact No drift Strength 5/5 bilateral upper and lower extremities dtr normal and equal Sensation intact Results for orders placed during the hospital encounter of 10/04/13  COMPREHENSIVE METABOLIC PANEL      Result Value Ref Range   Sodium 122 (*) 137 - 147 mEq/L   Potassium 3.9  3.7 - 5.3 mEq/L   Chloride 87 (*) 96 - 112 mEq/L   CO2 19  19 - 32 mEq/L   Glucose, Bld 83  70 - 99 mg/dL   BUN 16  6 - 23 mg/dL   Creatinine, Ser 1.61  0.50 - 1.10 mg/dL   Calcium 8.6  8.4 - 09.6 mg/dL   Total Protein 7.1  6.0 - 8.3 g/dL   Albumin 3.8  3.5 - 5.2 g/dL   AST 14  0 - 37 U/L   ALT 19  0 - 35 U/L   Alkaline Phosphatase 83  39 - 117 U/L   Total Bilirubin 0.2 (*) 0.3 - 1.2 mg/dL   GFR calc non Af Amer 77 (*) >90 mL/min   GFR calc Af Amer 89 (*) >90 mL/min   Anion gap 16 (*) 5 - 15  CBC      Result Value Ref Range   WBC 6.3  4.0 - 10.5 K/uL   RBC 3.82 (*) 3.87 - 5.11 MIL/uL   Hemoglobin 11.7 (*) 12.0 - 15.0 g/dL   HCT 04.5 (*) 40.9 - 81.1 %   MCV 90.6  78.0 - 100.0 fL   MCH 30.6  26.0 - 34.0 pg   MCHC 33.8  30.0 - 36.0 g/dL   RDW 91.4  78.2 - 95.6 %   Platelets 219  150 - 400 K/uL   Patient with significant hyponatremia.  Plan further evaluation and treatment in hospital.  I saw and evaluated the patient, reviewed the resident's note and I agree with the findings and plan.   Hilario Quarry, MD 10/04/13 [phone]

## 2013-10-05 DIAGNOSIS — E876 Hypokalemia: Secondary | ICD-10-CM

## 2013-10-05 LAB — CHLORIDE, URINE, RANDOM: CHLORIDE URINE: 110 meq/L

## 2013-10-05 LAB — CBC
HCT: 28.9 % — ABNORMAL LOW (ref 36.0–46.0)
HEMOGLOBIN: 9.8 g/dL — AB (ref 12.0–15.0)
MCH: 30.3 pg (ref 26.0–34.0)
MCHC: 33.9 g/dL (ref 30.0–36.0)
MCV: 89.5 fL (ref 78.0–100.0)
Platelets: 189 10*3/uL (ref 150–400)
RBC: 3.23 MIL/uL — AB (ref 3.87–5.11)
RDW: 12.5 % (ref 11.5–15.5)
WBC: 7 10*3/uL (ref 4.0–10.5)

## 2013-10-05 LAB — GLUCOSE, CAPILLARY
GLUCOSE-CAPILLARY: 76 mg/dL (ref 70–99)
Glucose-Capillary: 84 mg/dL (ref 70–99)
Glucose-Capillary: 70 mg/dL (ref 70–99)
Glucose-Capillary: 169 mg/dL — ABNORMAL HIGH (ref 70–99)

## 2013-10-05 LAB — BASIC METABOLIC PANEL
Anion gap: 12 (ref 5–15)
Anion gap: 12 (ref 5–15)
Anion gap: 13 (ref 5–15)
Anion gap: 12 (ref 5–15)
Anion gap: 11 (ref 5–15)
Anion gap: 15 (ref 5–15)
Anion gap: 12 (ref 5–15)
BUN: 12 mg/dL (ref 6–23)
BUN: 12 mg/dL (ref 6–23)
BUN: 12 mg/dL (ref 6–23)
BUN: 12 mg/dL (ref 6–23)
BUN: 12 mg/dL (ref 6–23)
BUN: 11 mg/dL (ref 6–23)
BUN: 12 mg/dL (ref 6–23)
CALCIUM: 8.1 mg/dL — AB (ref 8.4–10.5)
CALCIUM: 8 mg/dL — AB (ref 8.4–10.5)
CALCIUM: 8.1 mg/dL — AB (ref 8.4–10.5)
CO2: 20 mEq/L (ref 19–32)
CO2: 19 mEq/L (ref 19–32)
CO2: 19 mEq/L (ref 19–32)
CO2: 19 mEq/L (ref 19–32)
CO2: 19 mEq/L (ref 19–32)
CO2: 17 mEq/L — ABNORMAL LOW (ref 19–32)
CO2: 21 mEq/L (ref 19–32)
CREATININE: 0.78 mg/dL (ref 0.50–1.10)
CREATININE: 0.84 mg/dL (ref 0.50–1.10)
Calcium: 8.2 mg/dL — ABNORMAL LOW (ref 8.4–10.5)
Calcium: 8 mg/dL — ABNORMAL LOW (ref 8.4–10.5)
Calcium: 8.1 mg/dL — ABNORMAL LOW (ref 8.4–10.5)
Calcium: 8.4 mg/dL (ref 8.4–10.5)
Chloride: 86 mEq/L — ABNORMAL LOW (ref 96–112)
Chloride: 85 mEq/L — ABNORMAL LOW (ref 96–112)
Chloride: 87 mEq/L — ABNORMAL LOW (ref 96–112)
Chloride: 84 mEq/L — ABNORMAL LOW (ref 96–112)
Chloride: 88 mEq/L — ABNORMAL LOW (ref 96–112)
Chloride: 90 mEq/L — ABNORMAL LOW (ref 96–112)
Chloride: 88 mEq/L — ABNORMAL LOW (ref 96–112)
Creatinine, Ser: 0.77 mg/dL (ref 0.50–1.10)
Creatinine, Ser: 0.8 mg/dL (ref 0.50–1.10)
Creatinine, Ser: 0.79 mg/dL (ref 0.50–1.10)
Creatinine, Ser: 0.83 mg/dL (ref 0.50–1.10)
Creatinine, Ser: 0.84 mg/dL (ref 0.50–1.10)
GFR calc Af Amer: >90 mL/min (ref >90)
GFR calc Af Amer: >90 mL/min (ref >90)
GFR calc non Af Amer: >90 mL/min (ref >90)
GFR calc non Af Amer: 78 mL/min — ABNORMAL LOW (ref >90)
GFR, EST NON AFRICAN AMERICAN: 83 mL/min — AB (ref >90)
GFR, EST NON AFRICAN AMERICAN: 79 mL/min — AB (ref >90)
GFR, EST NON AFRICAN AMERICAN: 78 mL/min — AB (ref >90)
GLUCOSE: 94 mg/dL (ref 70–99)
GLUCOSE: 67 mg/dL — AB (ref 70–99)
Glucose, Bld: 83 mg/dL (ref 70–99)
Glucose, Bld: 80 mg/dL (ref 70–99)
Glucose, Bld: 80 mg/dL (ref 70–99)
Glucose, Bld: 130 mg/dL — ABNORMAL HIGH (ref 70–99)
Glucose, Bld: 214 mg/dL — ABNORMAL HIGH (ref 70–99)
POTASSIUM: 4 meq/L (ref 3.7–5.3)
POTASSIUM: 3.9 meq/L (ref 3.7–5.3)
POTASSIUM: 3.6 meq/L — AB (ref 3.7–5.3)
POTASSIUM: 3.2 meq/L — AB (ref 3.7–5.3)
POTASSIUM: 4 meq/L (ref 3.7–5.3)
POTASSIUM: 4.2 meq/L (ref 3.7–5.3)
Potassium: 3.7 mEq/L (ref 3.7–5.3)
SODIUM: 118 meq/L — AB (ref 137–147)
SODIUM: 116 meq/L — AB (ref 137–147)
SODIUM: 119 meq/L — AB (ref 137–147)
SODIUM: 115 meq/L — AB (ref 137–147)
SODIUM: 118 meq/L — AB (ref 137–147)
Sodium: 122 mEq/L — ABNORMAL LOW (ref 137–147)
Sodium: 121 mEq/L — CL (ref 137–147)

## 2013-10-05 LAB — URIC ACID, RANDOM URINE: Uric Acid, Urine: 28.7 mg/dL

## 2013-10-05 LAB — OSMOLALITY: OSMOLALITY: 246 mosm/kg — AB (ref 275–300)

## 2013-10-05 LAB — HEMOGLOBIN A1C
Hgb A1c MFr Bld: 5 % (ref <5.7)
Mean Plasma Glucose: 97 mg/dL (ref <117)

## 2013-10-05 LAB — T4, FREE: FREE T4: 1.1 ng/dL (ref 0.80–1.80)

## 2013-10-05 LAB — OSMOLALITY, URINE: Osmolality, Ur: 331 mOsm/kg — ABNORMAL LOW (ref 390–1090)

## 2013-10-05 LAB — CORTISOL: CORTISOL PLASMA: 16.6 ug/dL

## 2013-10-05 MED ORDER — LEVOTHYROXINE SODIUM 112 MCG PO TABS
112.0000 ug | ORAL_TABLET | Freq: Every day | ORAL | Status: DC
  Administered 2013-10-05 – 2013-10-08 (×4): 112 ug via ORAL
  Filled 2013-10-05 (×5): qty 1

## 2013-10-05 MED ORDER — ALPRAZOLAM 1 MG PO TABS
2.0000 mg | ORAL_TABLET | Freq: Three times a day (TID) | ORAL | Status: DC | PRN
  Administered 2013-10-05 – 2013-10-07 (×6): 2 mg via ORAL
  Filled 2013-10-05 (×6): qty 2

## 2013-10-05 MED ORDER — SUMATRIPTAN SUCCINATE 100 MG PO TABS
100.0000 mg | ORAL_TABLET | ORAL | Status: DC | PRN
  Administered 2013-10-05 – 2013-10-07 (×3): 100 mg via ORAL
  Filled 2013-10-05 (×4): qty 1

## 2013-10-05 MED ORDER — OXYCODONE-ACETAMINOPHEN 5-325 MG PO TABS
1.0000 | ORAL_TABLET | Freq: Three times a day (TID) | ORAL | Status: DC | PRN
  Administered 2013-10-05 – 2013-10-07 (×4): 1 via ORAL
  Filled 2013-10-05 (×4): qty 1

## 2013-10-05 MED ORDER — OXYCODONE HCL ER 20 MG PO T12A
20.0000 mg | EXTENDED_RELEASE_TABLET | Freq: Two times a day (BID) | ORAL | Status: DC
  Administered 2013-10-05 – 2013-10-06 (×3): 20 mg via ORAL
  Filled 2013-10-05 (×4): qty 1

## 2013-10-05 MED ORDER — SODIUM CHLORIDE 0.9 % IV SOLN: INTRAVENOUS | Status: DC

## 2013-10-05 MED ORDER — POTASSIUM CHLORIDE CRYS ER 20 MEQ PO TBCR
40.0000 meq | EXTENDED_RELEASE_TABLET | Freq: Once | ORAL | Status: AC
  Administered 2013-10-05: 40 meq via ORAL
  Filled 2013-10-05: qty 2

## 2013-10-05 NOTE — Progress Notes (Signed)
CRITICAL VALUE ALERT  Critical value received: Sodium 118  Date of notification:  10/05/13  Time of notification: 1053  Critical value read back: yes  Nurse who received alert:  Ethelene Hal, RN  MD notified (1st page):  Dr. Dorris Carnes. Dhungel  Time of first page: 1054  MD notified (2nd page):  Time of second page:  Responding MD:  Dr. Dorris Carnes. Dhungel  Time MD responded:  1055

## 2013-10-05 NOTE — Progress Notes (Signed)
CRITICAL VALUE ALERT  Critical value received:  Serum osmalility  Date of notification:  10/05/13  Time of notification:  0844  Critical value read back: yes   Nurse who received alert: Ethelene Hal, RN  MD notified (1st page):  N. Dhungel  Time of first page:  0845  MD notified (2nd page):  Time of second page:  Responding MD: Orlena Sheldon  Time MD responded:  [phone]

## 2013-10-05 NOTE — Progress Notes (Signed)
CRITICAL VALUE ALERT  Critical value received:  Sodium 115  Date of notification:  10/05/13  Time of notification:  0820          Critical value read back: yes   Nurse who received alert:  Ethelene Hal, RN   MD notified (1st page):  N. Dhungel  Time of first page:  0822  MD notified (2nd page): N. Dhungel  Time of second page: 1610  Responding MD: Orlena Sheldon  Time MD responded: [phone]

## 2013-10-05 NOTE — Progress Notes (Signed)
[facility] PROGRESS NOTE  Valerie Silva:[id number] DOB: 03/30/1960 DOA: 10/04/2013 PCP: [name], MD   Brief narrative 53 y.o. female with history of bipolar disorder, extreme anxiety, gastric bypass surgery causing iron and B12 deficiency, follows with Dr. [name],  chronic headaches and back pain, type 2 diabetes mellitus, hypothyroidism, osteoporosis, fibromyalgia, recently treated with abx for STD presented to the ER with migraine and some muscle spasms. She also reported personal stress recently. Workup in ED showed severe hyponatremia with Na of 122 and admitted to telemetry.   Assessment/Plan: Severe hyponatremia Noted for low serum and urine osm  And normal urine Na suggests SIADH, normal uric acid. Also on HCTZ-triamterine which was held on admission.. Patient also reports drinking a lot of water as her throat is always dry. Maintain strict fluid restriction to 800 cc per day. Monitor serum Na worsened this am to 116. Monitor q 2 hr. Stable on telemetry. Allow regular diet with high sodium content. -d/c NSAIDs -If Na unimproved with strict fluid restriction she will need sodium tablets.  chronic pain and anxiety  verified meds with outpt pharmacy, she is on oxycontin 30 mg tid and percocet 10 mg tid prn for breakthrough pain. Also on xanax 2 mg tid for anxiety. Will resume pain meds at a lower dose. Resume xanax. continue flexeril.  Hypokalemia  replenish  hypothyroidism Patient has persistently low TSH since last 1 year. informs reducing dse fo synthroid as outpt. Check free T4. will reduce dose to 112 mcg daily ( on 112 mcg bid every day except mondays when she is on once a day)  Migraine headaches  resume topamax and imitrex at lower dose  DM  not on meds. Monitor on SSI  Patient on medrol dose pak for reason unclear. continued    Code Status:  full Family Communication: none  at bedside Disposition Plan: home once  imrpoved   Consultants:  none  Procedures:  none  Antibiotics:  none  HPI/Subjective: Reports bodyaches and headache  Objective: Filed Vitals:   10/05/13 0540  BP: 102/65  Pulse: 62  Temp: 98 F (36.7 C)  Resp: 18    Intake/Output Summary (Last 24 hours) at 10/05/13 1107 Last data filed at 10/05/13 0015  Gross per 24 hour  Intake 751.67 ml  Output      0 ml  Net 751.67 ml   Filed Weights   10/05/13 0540  Weight: 96.843 kg (213 lb 8 oz)    Exam:   General:  NAD, appears tired  HEENT: moist oral mucosa  Cardiovascular: NS1&S2, no murmurs  Respiratory: Clear b/l  Abdomen: soft, NT, ND, BS+  Musculoskeletal: warm, no edema  CNS: Alert and oriented  Data Reviewed: Basic Metabolic Panel:  Recent Labs Lab 10/05/13 0105 10/05/13 0300 10/05/13 0549 10/05/13 0733 10/05/13 0958  NA 118* 116* 119* 115* 118*  K 4.0 3.7 3.9 3.6* 3.2*  CL 86* 85* 87* 84* 88*  CO2 GLUCOSE 94 83 80 80 130*  BUN CREATININE 0.77 0.80 0.79 0.83 0.84  CALCIUM 8.2* 8.0* 8.1* 8.0* 8.1*   Liver Function Tests:  Recent Labs Lab 10/04/13 1336  AST 14  ALT 19  ALKPHOS 83  BILITOT 0.2*  PROT 7.1  ALBUMIN 3.8   No results found for this basename: LIPASE, AMYLASE,  in the last 168 hours No results found for this basename: AMMONIA,  in the last 168 hours CBC:  Recent  Labs Lab 09/30/13 1326 10/04/13 1336 10/05/13 0549  WBC 3.8* 6.3 7.0  NEUTROABS 2.1  --   --   HGB 10.3* 11.7* 9.8*  HCT 32.1* 34.6* 28.9*  MCV 97 90.6 89.5  PLT 197 219 189   Cardiac Enzymes: No results found for this basename: CKTOTAL, CKMB, CKMBINDEX, TROPONINI,  in the last 168 hours BNP (last 3 results) No results found for this basename: PROBNP,  in the last 8760 hours CBG:  Recent Labs Lab 10/04/13 1810 10/04/13 2130 10/05/13 0739  GLUCAP 99 115* 84    No results found for this or any previous visit (from the past 240 hour(s)).   Studies: No  results found.  Scheduled Meds: . alprazolam  0.5 mg Oral BID  . antiseptic oral rinse  7 mL Mouth Rinse q12n4p  . chlorhexidine  15 mL Mouth Rinse BID  . heparin  5,000 Units Subcutaneous 3 times per day  . insulin aspart  0-5 Units Subcutaneous QHS  . insulin aspart  0-9 Units Subcutaneous TID WC  . levothyroxine  112 mcg Oral QAC breakfast  . loratadine  10 mg Oral BID  . OxyCODONE  20 mg Oral BID  . pantoprazole  40 mg Oral Daily  . predniSONE  40 mg Oral Q breakfast   Followed by  . [START ON 10/07/2013] predniSONE  30 mg Oral Q breakfast   Followed by  . [START ON 10/09/2013] predniSONE  20 mg Oral Q breakfast   Followed by  . [START ON 10/11/2013] predniSONE  10 mg Oral Q breakfast  . topiramate  100 mg Oral QHS   Continuous Infusions:     Time spent: 25 minutes    Destyn Parfitt  [facility] Pager [phone]. If 7PM-7AM, please contact night-coverage at [email or web address], password [facility] 10/05/2013, 11:07 AM  LOS: 1 day
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
## 2013-10-06 DIAGNOSIS — E222 Syndrome of inappropriate secretion of antidiuretic hormone: Secondary | ICD-10-CM | POA: Diagnosis present

## 2013-10-06 LAB — GLUCOSE, CAPILLARY
GLUCOSE-CAPILLARY: 68 mg/dL — AB (ref 70–99)
GLUCOSE-CAPILLARY: 92 mg/dL (ref 70–99)
Glucose-Capillary: 65 mg/dL — ABNORMAL LOW (ref 70–99)
Glucose-Capillary: 96 mg/dL (ref 70–99)
Glucose-Capillary: 87 mg/dL (ref 70–99)

## 2013-10-06 LAB — BASIC METABOLIC PANEL
ANION GAP: 12 (ref 5–15)
ANION GAP: 10 (ref 5–15)
Anion gap: 11 (ref 5–15)
Anion gap: 12 (ref 5–15)
Anion gap: 13 (ref 5–15)
Anion gap: 13 (ref 5–15)
BUN: 13 mg/dL (ref 6–23)
BUN: 13 mg/dL (ref 6–23)
BUN: 13 mg/dL (ref 6–23)
BUN: 13 mg/dL (ref 6–23)
BUN: 12 mg/dL (ref 6–23)
BUN: 11 mg/dL (ref 6–23)
CHLORIDE: 89 meq/L — AB (ref 96–112)
CHLORIDE: 87 meq/L — AB (ref 96–112)
CHLORIDE: 90 meq/L — AB (ref 96–112)
CHLORIDE: 89 meq/L — AB (ref 96–112)
CHLORIDE: 93 meq/L — AB (ref 96–112)
CO2: 19 mEq/L (ref 19–32)
CO2: 21 mEq/L (ref 19–32)
CO2: 21 mEq/L (ref 19–32)
CO2: 22 mEq/L (ref 19–32)
CO2: 22 mEq/L (ref 19–32)
CO2: 23 meq/L (ref 19–32)
CREATININE: 0.92 mg/dL (ref 0.50–1.10)
Calcium: 8.3 mg/dL — ABNORMAL LOW (ref 8.4–10.5)
Calcium: 8.4 mg/dL (ref 8.4–10.5)
Calcium: 8.5 mg/dL (ref 8.4–10.5)
Calcium: 8.6 mg/dL (ref 8.4–10.5)
Calcium: 8.6 mg/dL (ref 8.4–10.5)
Calcium: 8.7 mg/dL (ref 8.4–10.5)
Chloride: 90 mEq/L — ABNORMAL LOW (ref 96–112)
Creatinine, Ser: 0.78 mg/dL (ref 0.50–1.10)
Creatinine, Ser: 0.82 mg/dL (ref 0.50–1.10)
Creatinine, Ser: 0.83 mg/dL (ref 0.50–1.10)
Creatinine, Ser: 0.93 mg/dL (ref 0.50–1.10)
Creatinine, Ser: 1.04 mg/dL (ref 0.50–1.10)
GFR calc Af Amer: 80 mL/min — ABNORMAL LOW (ref >90)
GFR calc Af Amer: >90 mL/min (ref >90)
GFR calc Af Amer: >90 mL/min (ref >90)
GFR calc Af Amer: >90 mL/min (ref >90)
GFR calc non Af Amer: 80 mL/min — ABNORMAL LOW (ref >90)
GFR calc non Af Amer: 79 mL/min — ABNORMAL LOW (ref >90)
GFR calc non Af Amer: 60 mL/min — ABNORMAL LOW (ref >90)
GFR, EST AFRICAN AMERICAN: 70 mL/min — AB (ref >90)
GFR, EST AFRICAN AMERICAN: 81 mL/min — AB (ref >90)
GFR, EST NON AFRICAN AMERICAN: 69 mL/min — AB (ref >90)
GFR, EST NON AFRICAN AMERICAN: 70 mL/min — AB (ref >90)
GLUCOSE: 79 mg/dL (ref 70–99)
GLUCOSE: 92 mg/dL (ref 70–99)
Glucose, Bld: 92 mg/dL (ref 70–99)
Glucose, Bld: 107 mg/dL — ABNORMAL HIGH (ref 70–99)
Glucose, Bld: 100 mg/dL — ABNORMAL HIGH (ref 70–99)
Glucose, Bld: 94 mg/dL (ref 70–99)
POTASSIUM: 4 meq/L (ref 3.7–5.3)
POTASSIUM: 4.1 meq/L (ref 3.7–5.3)
Potassium: 3.8 mEq/L (ref 3.7–5.3)
Potassium: 3.9 mEq/L (ref 3.7–5.3)
Potassium: 4.2 mEq/L (ref 3.7–5.3)
Potassium: 4.3 mEq/L (ref 3.7–5.3)
SODIUM: 121 meq/L — AB (ref 137–147)
Sodium: 122 mEq/L — ABNORMAL LOW (ref 137–147)
Sodium: 122 mEq/L — ABNORMAL LOW (ref 137–147)
Sodium: 123 mEq/L — ABNORMAL LOW (ref 137–147)
Sodium: 124 mEq/L — ABNORMAL LOW (ref 137–147)
Sodium: 125 mEq/L — ABNORMAL LOW (ref 137–147)

## 2013-10-06 LAB — FERRITIN: Ferritin: 943 ng/mL — ABNORMAL HIGH (ref 10–291)

## 2013-10-06 LAB — IRON AND TIBC
Iron: 265 ug/dL — ABNORMAL HIGH (ref 42–135)
UIBC: <15 ug/dL — ABNORMAL LOW (ref 125–400)

## 2013-10-06 NOTE — Progress Notes (Signed)
CRITICAL VALUE ALERT  Critical value received:  NA+ of 121  Date of notification:  10/05/2013  Time of notification:  21:10  Critical value read back:Yes.    Nurse who received alert:  Cindra Eves . RN  MD notified (1st page): Lenny Pastel .NP   Time of first page:  21:29  MD notified (2nd page):  Time of second page:  Responding MD:  Lenny Pastel. NP  Time MD responded: 21:30

## 2013-10-06 NOTE — Progress Notes (Signed)
Nutrition Brief Note  Patient identified on the Malnutrition Screening Tool (MST) Report  Wt Readings from Last 15 Encounters:  10/06/13 213 lb 9.6 oz (96.888 kg)  09/30/13 218 lb (98.884 kg)  07/01/13 236 lb (107.049 kg)  03/06/13 261 lb (118.389 kg)  12/12/12 254 lb (115.214 kg)  08/06/12 234 lb (106.142 kg)  07/10/12 236 lb (107.049 kg)  07/08/12 238 lb 5.1 oz (108.1 kg)  05/30/12 226 lb 10.1 oz (102.8 kg)  05/07/12 226 lb (102.513 kg)  03/08/12 214 lb (97.07 kg)  12/29/11 208 lb (94.348 kg)  10/26/11 194 lb (87.998 kg)  09/20/11 190 lb (86.183 kg)  07/31/11 187 lb 2.7 oz (84.9 kg)    Body mass index is 31.53 kg/(m^2). Patient meets criteria for obese, class 1, based on current BMI.   Current diet order is Regular , patient is consuming approximately 95-100% of meals at this time. Labs and medications reviewed.   Pt has a hx of gastric bypass surgery.  Her lowest documented weight was 170 lbs in 2011.  Her usual weight since that times is between 200-250 lbs. Pt is currently eating well. Currently on 800 mL fluid restriction to manage hyponatremia. RN managing. No nutrition interventions warranted at this time. If nutrition issues arise, please consult RD.   Loyce Dys, MS RD LDN Clinical Inpatient Dietitian Weekend/After hours pager: [phone]

## 2013-10-06 NOTE — Progress Notes (Addendum)
[facility] PROGRESS NOTE  VEE BAHE ZOX:[id number] DOB: Apr 18, 1960 DOA: 10/04/2013 PCP: [name], MD   Brief narrative 53 y.o. female with history of bipolar disorder, extreme anxiety, gastric bypass surgery causing iron and B12 deficiency, follows with Dr. [name],  chronic headaches and back pain, type 2 diabetes mellitus, hypothyroidism, osteoporosis, fibromyalgia, recently treated with abx for STD presented to the ER with migraine and some muscle spasms. She also reported personal stress recently. Workup in ED showed severe hyponatremia with Na of 122 and admitted to telemetry.   Assessment/Plan: Severe hyponatremia Noted for low serum and urine osm and normal urine Na suggests SIADH, normal uric acid. Also on HCTZ-triamterine which was held on admission.. Patient also reports drinking a lot of water as her throat is always dry. Maintain strict fluid restriction to 800 cc per day. Serum Na slowly improving 123 this am.  Stable on telemetry. Allow regular diet with high sodium content. -d/c NSAIDs -If Na unimproved with strict fluid restriction will add sodium tablets. Renal consult if Na unimproved in next 24 hrs  chronic pain and anxiety  verified meds with outpt pharmacy, she is on regular OxyContin and oxycodone and xanax.  resumed pain pain meds at a lower dose. Resume xanax. continue flexeril.  Hypokalemia  replenished  hypothyroidism Patient has persistently low TSH since last 1 year. informs reducing dse fo synthroid as outpt.  free T4 normal . will reduce dose to 112 mcg daily ( on 112 mcg bid every day except mondays when she is on once a day)  Migraine headaches  resumed topamax and imitrex at lower dose. Stable at present  DM  not on meds. Monitor on SSI  Leg pain  no clear etiology. Has varicose veins with superficial tenderness to palpation b/l. Appears to be neuropathic. Check iron panel  Patient on medrol dose pak for reason unclear.  continued    Code Status:  full Family Communication: none  at bedside Disposition Plan: home once imrpoved   Consultants:  none  Procedures:  none  Antibiotics:  none  HPI/Subjective: Reports b/l leg pain which is like electric shocks since this am  Objective: Filed Vitals:   10/06/13 0615  BP: 110/70  Pulse: 61  Temp: 97.4 F (36.3 C)  Resp: 16    Intake/Output Summary (Last 24 hours) at 10/06/13 1019 Last data filed at 10/05/13 1430  Gross per 24 hour  Intake    240 ml  Output      0 ml  Net    240 ml   Filed Weights   10/05/13 0540 10/06/13 0615  Weight: 96.843 kg (213 lb 8 oz) 96.888 kg (213 lb 9.6 oz)    Exam:   General:  NAD  HEENT: moist oral mucosa  Cardiovascular: NS1&S2, no murmurs  Respiratory: Clear b/l  Abdomen: soft, NT, ND, BS+  Musculoskeletal: warm, no edema, tender to pressure over b/l legs, no swelling, varicose vein over b/l calves  CNS: Alert and oriented  Data Reviewed: Basic Metabolic Panel:  Recent Labs Lab 10/05/13 1702 10/05/13 2015 10/06/13 0001 10/06/13 0407 10/06/13 0800  NA 122* 121* 122* 123* 121*  K 4.0 4.2 4.2 4.3 3.8  CL 90* 88* 90* 89* 87*  CO2 17* GLUCOSE 214* 67* 92 79 92  BUN CREATININE 0.78 0.84 0.78 0.82 0.83  CALCIUM 8.1* 8.4 8.3* 8.6 8.4   Liver Function Tests:  Recent Labs Lab 10/04/13 1336  AST 14  ALT 19  ALKPHOS 83  BILITOT 0.2*  PROT 7.1  ALBUMIN 3.8   No results found for this basename: LIPASE, AMYLASE,  in the last 168 hours No results found for this basename: AMMONIA,  in the last 168 hours CBC:  Recent Labs Lab 09/30/13 1326 10/04/13 1336 10/05/13 0549  WBC 3.8* 6.3 7.0  NEUTROABS 2.1  --   --   HGB 10.3* 11.7* 9.8*  HCT 32.1* 34.6* 28.9*  MCV 97 90.6 89.5  PLT 197 219 189   Cardiac Enzymes: No results found for this basename: CKTOTAL, CKMB, CKMBINDEX, TROPONINI,  in the last 168 hours BNP (last 3 results) No results found for  this basename: PROBNP,  in the last 8760 hours CBG:  Recent Labs Lab 10/05/13 1128 10/05/13 1631 10/05/13 2113 10/06/13 0733 10/06/13 0834  GLUCAP 70 169* 76 65* 68*    No results found for this or any previous visit (from the past 240 hour(s)).   Studies: No results found.  Scheduled Meds: . antiseptic oral rinse  7 mL Mouth Rinse q12n4p  . chlorhexidine  15 mL Mouth Rinse BID  . heparin  5,000 Units Subcutaneous 3 times per day  . insulin aspart  0-5 Units Subcutaneous QHS  . insulin aspart  0-9 Units Subcutaneous TID WC  . levothyroxine  112 mcg Oral QAC breakfast  . loratadine  10 mg Oral BID  . OxyCODONE  20 mg Oral BID  . pantoprazole  40 mg Oral Daily  . [START ON 10/07/2013] predniSONE  30 mg Oral Q breakfast   Followed by  . [START ON 10/09/2013] predniSONE  20 mg Oral Q breakfast   Followed by  . [START ON 10/11/2013] predniSONE  10 mg Oral Q breakfast  . topiramate  100 mg Oral QHS   Continuous Infusions:     Time spent: 25 minutes    Leanora Murin  [facility] Pager [phone]. If 7PM-7AM, please contact night-coverage at [email or web address], password [facility] 10/06/2013, 10:19 AM  LOS: 2 days

## 2013-10-07 LAB — GLUCOSE, CAPILLARY
GLUCOSE-CAPILLARY: 74 mg/dL (ref 70–99)
GLUCOSE-CAPILLARY: 89 mg/dL (ref 70–99)
GLUCOSE-CAPILLARY: 96 mg/dL (ref 70–99)
Glucose-Capillary: 34 mg/dL — CL (ref 70–99)
Glucose-Capillary: 97 mg/dL (ref 70–99)
Glucose-Capillary: 85 mg/dL (ref 70–99)

## 2013-10-07 LAB — BASIC METABOLIC PANEL
ANION GAP: 13 (ref 5–15)
Anion gap: 12 (ref 5–15)
Anion gap: 13 (ref 5–15)
Anion gap: 13 (ref 5–15)
Anion gap: 14 (ref 5–15)
BUN: 11 mg/dL (ref 6–23)
BUN: 12 mg/dL (ref 6–23)
BUN: 13 mg/dL (ref 6–23)
BUN: 13 mg/dL (ref 6–23)
BUN: 14 mg/dL (ref 6–23)
BUN: 15 mg/dL (ref 6–23)
CALCIUM: 8.3 mg/dL — AB (ref 8.4–10.5)
CALCIUM: 8.8 mg/dL (ref 8.4–10.5)
CALCIUM: 8.5 mg/dL (ref 8.4–10.5)
CALCIUM: 8.7 mg/dL (ref 8.4–10.5)
CHLORIDE: 95 meq/L — AB (ref 96–112)
CHLORIDE: 92 meq/L — AB (ref 96–112)
CO2: 20 mEq/L (ref 19–32)
CO2: 20 mEq/L (ref 19–32)
CO2: 20 mEq/L (ref 19–32)
CO2: 21 mEq/L (ref 19–32)
CO2: 21 meq/L (ref 19–32)
CO2: 22 mEq/L (ref 19–32)
CREATININE: 1.08 mg/dL (ref 0.50–1.10)
CREATININE: 0.88 mg/dL (ref 0.50–1.10)
CREATININE: 0.89 mg/dL (ref 0.50–1.10)
CREATININE: 0.88 mg/dL (ref 0.50–1.10)
CREATININE: 1.07 mg/dL (ref 0.50–1.10)
Calcium: 8.6 mg/dL (ref 8.4–10.5)
Calcium: 8.7 mg/dL (ref 8.4–10.5)
Chloride: 96 mEq/L (ref 96–112)
Chloride: 96 mEq/L (ref 96–112)
Chloride: 98 mEq/L (ref 96–112)
Creatinine, Ser: 0.94 mg/dL (ref 0.50–1.10)
GFR calc Af Amer: 67 mL/min — ABNORMAL LOW (ref >90)
GFR calc non Af Amer: 58 mL/min — ABNORMAL LOW (ref >90)
GFR calc non Af Amer: 58 mL/min — ABNORMAL LOW (ref >90)
GFR calc non Af Amer: 73 mL/min — ABNORMAL LOW (ref >90)
GFR calc non Af Amer: 74 mL/min — ABNORMAL LOW (ref >90)
GFR, EST AFRICAN AMERICAN: 79 mL/min — AB (ref >90)
GFR, EST AFRICAN AMERICAN: 84 mL/min — AB (ref >90)
GFR, EST AFRICAN AMERICAN: 85 mL/min — AB (ref >90)
GFR, EST AFRICAN AMERICAN: 85 mL/min — AB (ref >90)
GFR, EST AFRICAN AMERICAN: 67 mL/min — AB (ref >90)
GFR, EST NON AFRICAN AMERICAN: 68 mL/min — AB (ref >90)
GFR, EST NON AFRICAN AMERICAN: 74 mL/min — AB (ref >90)
Glucose, Bld: 103 mg/dL — ABNORMAL HIGH (ref 70–99)
Glucose, Bld: 119 mg/dL — ABNORMAL HIGH (ref 70–99)
Glucose, Bld: 120 mg/dL — ABNORMAL HIGH (ref 70–99)
Glucose, Bld: 91 mg/dL (ref 70–99)
Glucose, Bld: 97 mg/dL (ref 70–99)
POTASSIUM: 4.2 meq/L (ref 3.7–5.3)
Potassium: 3.3 mEq/L — ABNORMAL LOW (ref 3.7–5.3)
Potassium: 4.2 mEq/L (ref 3.7–5.3)
Potassium: 4.2 mEq/L (ref 3.7–5.3)
Potassium: 4.5 mEq/L (ref 3.7–5.3)
Sodium: 125 mEq/L — ABNORMAL LOW (ref 137–147)
Sodium: 129 mEq/L — ABNORMAL LOW (ref 137–147)
Sodium: 130 mEq/L — ABNORMAL LOW (ref 137–147)
Sodium: 131 mEq/L — ABNORMAL LOW (ref 137–147)
Sodium: 131 mEq/L — ABNORMAL LOW (ref 137–147)

## 2013-10-07 MED ORDER — POTASSIUM CHLORIDE CRYS ER 20 MEQ PO TBCR
40.0000 meq | EXTENDED_RELEASE_TABLET | Freq: Once | ORAL | Status: AC
  Administered 2013-10-07: 40 meq via ORAL
  Filled 2013-10-07: qty 2

## 2013-10-07 MED ORDER — OXYCODONE HCL ER 20 MG PO T12A
20.0000 mg | EXTENDED_RELEASE_TABLET | Freq: Two times a day (BID) | ORAL | Status: DC
  Administered 2013-10-07 – 2013-10-08 (×2): 20 mg via ORAL
  Filled 2013-10-07 (×2): qty 1

## 2013-10-07 MED ORDER — ESCITALOPRAM OXALATE 20 MG PO TABS
20.0000 mg | ORAL_TABLET | Freq: Every day | ORAL | Status: DC
  Administered 2013-10-07 – 2013-10-08 (×2): 20 mg via ORAL
  Filled 2013-10-07 (×2): qty 1

## 2013-10-07 MED ORDER — DEXTROSE 50 % IV SOLN
50.0000 mL | Freq: Once | INTRAVENOUS | Status: AC | PRN
Start: 2013-10-07 — End: 2013-10-07
  Administered 2013-10-07: 50 mL via INTRAVENOUS
  Filled 2013-10-07: qty 50

## 2013-10-07 MED ORDER — OXYCODONE HCL ER 10 MG PO T12A
10.0000 mg | EXTENDED_RELEASE_TABLET | Freq: Two times a day (BID) | ORAL | Status: DC
  Administered 2013-10-07: 10 mg via ORAL
  Filled 2013-10-07: qty 1

## 2013-10-07 NOTE — Progress Notes (Signed)
INITIAL NUTRITION ASSESSMENT  DOCUMENTATION CODES Per approved criteria  -Obesity Unspecified   INTERVENTION: - Encouraged excellent meal intake and to order snacks in between meals to help prevent hypoglycemia - RD to continue to monitor   NUTRITION DIAGNOSIS: Impaired nutrition utilization related to diabetes as evidenced by periods of hypoglycemia.   Goal: 1. CBGs WNL 2. Pt to consume >90% of meals  Monitor:  Weights, labs, intake  Reason for Assessment: Consult for assessment   53 y.o. female  Admitting Dx: Hyponatremia  ASSESSMENT: Pt with history of bipolar disorder, extreme anxiety, gastric bypass surgery causing iron and B12 deficiency follows with Dr. Marin Olp for that, chronic headaches and back pain, type 2 diabetes mellitus, hypothyroidism, osteoporosis, fibromyalgia, who is recently contracted STD and was treated with appropriate antibiotics a few days ago. Has been having some psychological stress at home. Came to the ER today for migraine and some muscle spasms, in the ER workup was found to have hyponatremia.   - Seen by inpatient RD yesterday who noted pt was eating well during admission, 95-100% of meals - Met with pt who reports poor appetite at home, 0-2 meals/day - Says she eats a lot of greek yogurt for protein  - Takes multivitamin at home PRN but gets monthly vitamin B12 injection - Not on any nutritional supplements at home - Per RN, pt has been having episodes of hypoglycemia - Offered to order pt nutritional supplements or snacks to help with this however pt not interested. Said she was going to save her grapes from breakfast tray for later on.  - Did not perform nutrition focused physical exam as pt eating breakfast    Height: Ht Readings from Last 1 Encounters:  09/30/13 $RemoveB'5\' 9"'vfSamLwK$  (1.753 m)    Weight: Wt Readings from Last 1 Encounters:  10/07/13 216 lb (97.977 kg)    Ideal Body Weight: 145 lbs   % Ideal Body Weight: 149%  Wt Readings from  Last 10 Encounters:  10/07/13 216 lb (97.977 kg)  09/30/13 218 lb (98.884 kg)  07/01/13 236 lb (107.049 kg)  03/06/13 261 lb (118.389 kg)  12/12/12 254 lb (115.214 kg)  08/06/12 234 lb (106.142 kg)  07/10/12 236 lb (107.049 kg)  07/08/12 238 lb 5.1 oz (108.1 kg)  05/30/12 226 lb 10.1 oz (102.8 kg)  05/07/12 226 lb (102.513 kg)    Usual Body Weight: 236 lbs in June 2015  % Usual Body Weight: 92%  BMI:  Body mass index is 31.88 kg/(m^2). Class I obesity   Estimated Nutritional Needs: Kcal: 1650-1850 Protein: 65-80g Fluid: 867ml/day per MD  Skin: intact   Diet Order: General with 812ml/day fluid restriction   EDUCATION NEEDS: -No education needs identified at this time   Intake/Output Summary (Last 24 hours) at 10/07/13 1117 Last data filed at 10/07/13 0948  Gross per 24 hour  Intake    600 ml  Output      0 ml  Net    600 ml    Last BM: 9/24  Labs:   Recent Labs Lab 10/07/13 10/07/13 0401 10/07/13 0547  NA 125* QUESTIONABLE RESULTS, RECOMMEND RECOLLECT TO VERIFY 129*  K 4.2 QUESTIONABLE RESULTS, RECOMMEND RECOLLECT TO VERIFY 3.3*  CL 92* QUESTIONABLE RESULTS, RECOMMEND RECOLLECT TO VERIFY 95*  CO2 $Re'21 20 21  'Dfq$ BUN $R'11 13 14  'or$ CREATININE 0.88 1.07 1.08  CALCIUM 8.5 8.7 8.6  GLUCOSE 91 QUESTIONABLE RESULTS, RECOMMEND RECOLLECT TO VERIFY 119*    CBG (last 3)   Recent Labs  10/07/13 0610 10/07/13 0719 10/07/13 0804  GLUCAP 97 74 89    Scheduled Meds: . antiseptic oral rinse  7 mL Mouth Rinse q12n4p  . chlorhexidine  15 mL Mouth Rinse BID  . escitalopram  20 mg Oral Daily  . heparin  5,000 Units Subcutaneous 3 times per day  . insulin aspart  0-5 Units Subcutaneous QHS  . insulin aspart  0-9 Units Subcutaneous TID WC  . levothyroxine  112 mcg Oral QAC breakfast  . loratadine  10 mg Oral BID  . OxyCODONE  20 mg Oral BID  . pantoprazole  40 mg Oral Daily  . predniSONE  30 mg Oral Q breakfast   Followed by  . [START ON 10/09/2013] predniSONE  20 mg  Oral Q breakfast   Followed by  . [START ON 10/11/2013] predniSONE  10 mg Oral Q breakfast  . topiramate  100 mg Oral QHS    Continuous Infusions:   Past Medical History  Diagnosis Date  . Osteoporosis   . Hypothyroidism   . Diabetes mellitus   . Exogenous obesity   . Rhinitis   . Arthritis   . Fibromyalgia   . Somnolence   . Sleep apnea   . Anemia, iron deficiency 04/06/2011  . Pernicious anemia 04/06/2011  . Depression   . Anxiety   . Iron deficiency anemia, unspecified 12/12/2012    Past Surgical History  Procedure Laterality Date  . Gastric bypass    . Cholecystectomy    . Tracheostomy    . Uvulectomy    . Tonsillectomy    . Mandible reconstruction    . Spinal fusion    . Dilation and curettage of uterus    . Cesarean section      Carlis Stable MS, [place], [place] Pager [phone] Weekend/After Hours Pager

## 2013-10-07 NOTE — Progress Notes (Signed)
Received a call from Lab this morning with a questionable serum Glucose of 36 and a K+ 2.9. Requested a redraw because pt last K+ at 0000 10/07/2013 was 4.2. Pt denies any symptoms of hypoglycemic effects. While awaiting lab to come get redraws, Check pt CBG and it was 34. Implemented the hypoglycemic protocol . Give IV D 50%  as order per protocol. Pt CBG  Recheck in 15 minutes was 91.  Pt continue to denied all symptoms of hypoglycemia.

## 2013-10-07 NOTE — Progress Notes (Signed)
[facility] PROGRESS NOTE  Valerie Silva ZOX:[id number] DOB: 10-Feb-1960 DOA: 10/04/2013 PCP: Valerie Bradford, MD   Brief narrative 53 y.o. female with history of bipolar disorder, extreme anxiety, gastric bypass surgery causing iron and B12 deficiency, follows with Dr. Myna Silva,  chronic headaches and back pain, type 2 diabetes mellitus, hypothyroidism, osteoporosis, fibromyalgia, recently treated with abx for STD presented to the ER with migraine and some muscle spasms. She also reported personal stress recently. Workup in ED showed severe hyponatremia with Na of 122 and admitted to telemetry.   Assessment/Plan: Severe hyponatremia Noted for low serum and urine osm and normal urine Na suggests SIADH, normal uric acid. Also on HCTZ-triamterine which was held on admission.. Patient also reports drinking a lot of water as her throat is always dry. Maintain strict fluid restriction to 800 cc per day. Serum Na slowly improving 130 this am  Stable on telemetry. Will discontinue  Allow regular diet with high sodium content. -d/c NSAIDs   chronic pain and anxiety  verified meds with outpt pharmacy, she is on regular OxyContin and oxycodone and xanax.  resumed pain pain meds at a lower dose. Resume xanax. continue flexeril. Resume dlexapro  Hypokalemia  replenished  hypothyroidism Patient has persistently low TSH since last 1 year. informs reducing dse fo synthroid as outpt.  free T4 normal . will reduce dose to 112 mcg daily ( on 112 mcg bid every day except mondays when she is on once a day)  Migraine headaches  resumed topamax and imitrex at lower dose. Stable at present  DM  not on meds. A1C of 5. Had episode of hypoglycemia early this am. Nutrition consult appreciated. Ordered for bedtime snack   Hypotension  stable with SBP in 90s. Reports chronically running low BP     Patient on medrol dose pak for reason unclear. continued    Code Status:  full Family Communication:  none  at bedside Disposition Plan: home once imrpoved   Consultants:  none  Procedures:  none  Antibiotics:  none  HPI/Subjective: Low fsg this am anD given D50. Reports chr pain. Low BP noted but says that she usually runs low  Objective: Filed Vitals:   10/07/13 1403  BP: 99/62  Pulse: 80  Temp: 98.1 F (36.7 C)  Resp: 17    Intake/Output Summary (Last 24 hours) at 10/07/13 1518 Last data filed at 10/07/13 1100  Gross per 24 hour  Intake    480 ml  Output      0 ml  Net    480 ml   Filed Weights   10/05/13 0540 10/06/13 0615 10/07/13 0519  Weight: 96.843 kg (213 lb 8 oz) 96.888 kg (213 lb 9.6 oz) 97.977 kg (216 lb)    Exam:   General:  NAD  HEENT: moist oral mucosa  Cardiovascular: NS1&S2, no murmurs  Respiratory: Clear b/l  Abdomen: soft, NT, ND, BS+  Musculoskeletal: warm, no edema,   CNS: Alert and oriented  Data Reviewed: Basic Metabolic Panel:  Recent Labs Lab 10/06/13 2010 10/07/13 10/07/13 0401 10/07/13 0547 10/07/13 1207  NA 125* 125* QUESTIONABLE RESULTS, RECOMMEND RECOLLECT TO VERIFY 129* 130*  K 3.9 4.2 QUESTIONABLE RESULTS, RECOMMEND RECOLLECT TO VERIFY 3.3* 4.2  CL 93* 92* QUESTIONABLE RESULTS, RECOMMEND RECOLLECT TO VERIFY 95* 96  CO2 GLUCOSE 94 91 QUESTIONABLE RESULTS, RECOMMEND RECOLLECT TO VERIFY 119* 103*  BUN CREATININE 0.92 0.88 1.07 1.08 0.94  CALCIUM 8.6  8.5 8.7 8.6 8.3*   Liver Function Tests:  Recent Labs Lab 10/04/13 1336  AST 14  ALT 19  ALKPHOS 83  BILITOT 0.2*  PROT 7.1  ALBUMIN 3.8   No results found for this basename: LIPASE, AMYLASE,  in the last 168 hours No results found for this basename: AMMONIA,  in the last 168 hours CBC:  Recent Labs Lab 10/04/13 1336 10/05/13 0549  WBC 6.3 7.0  HGB 11.7* 9.8*  HCT 34.6* 28.9*  MCV 90.6 89.5  PLT 219 189   Cardiac Enzymes: No results found for this basename: CKTOTAL, CKMB, CKMBINDEX, TROPONINI,  in the last 168  hours BNP (last 3 results) No results found for this basename: PROBNP,  in the last 8760 hours CBG:  Recent Labs Lab 10/07/13 0510 10/07/13 0610 10/07/13 0719 10/07/13 0804 10/07/13 1122  GLUCAP 34* 97 74 89 85    No results found for this or any previous visit (from the past 240 hour(s)).   Studies: No results found.  Scheduled Meds: . antiseptic oral rinse  7 mL Mouth Rinse q12n4p  . chlorhexidine  15 mL Mouth Rinse BID  . escitalopram  20 mg Oral Daily  . heparin  5,000 Units Subcutaneous 3 times per day  . insulin aspart  0-5 Units Subcutaneous QHS  . insulin aspart  0-9 Units Subcutaneous TID WC  . levothyroxine  112 mcg Oral QAC breakfast  . loratadine  10 mg Oral BID  . OxyCODONE  20 mg Oral BID  . pantoprazole  40 mg Oral Daily  . predniSONE  30 mg Oral Q breakfast   Followed by  . [START ON 10/09/2013] predniSONE  20 mg Oral Q breakfast   Followed by  . [START ON 10/11/2013] predniSONE  10 mg Oral Q breakfast  . topiramate  100 mg Oral QHS   Continuous Infusions:     Time spent: 25 minutes    Valerie Silva  [facility] Pager [phone]. If 7PM-7AM, please contact night-coverage at [email or web address], password [facility] 10/07/2013, 3:18 PM  LOS: 3 days

## 2013-10-07 NOTE — Progress Notes (Signed)
Walked into room and found pt sitting in bed eating a big cup of ice cream with family members at her side, educated pt about her fluid restrictions and her sugar intake. Pt verbalized understanding but continue to eat ice cream.

## 2013-10-07 NOTE — Progress Notes (Signed)
Clinical Social Work  CSW followed up with patient at bedside. Patient sitting in bed watching TV and eating breakfast when CSW arrived. Patient reports she is upset with care and does not like being on fluid restrictions. Patient reports that her medications are not being managed properly. RN aware and reports director will be updated on patient's concerns. Patient reports she was not taking her Lexapro but is hopeful she will feel better now that MD has prescribed antidepressant medications again. Patient reports she does not want to talk anymore and she will finish her breakfast and take a nap. CSW will continue to follow to provide support.  [place], [place] [phone]

## 2013-10-08 LAB — BASIC METABOLIC PANEL
Anion gap: 11 (ref 5–15)
Anion gap: 14 (ref 5–15)
Anion gap: 15 (ref 5–15)
BUN: 13 mg/dL (ref 6–23)
BUN: 13 mg/dL (ref 6–23)
BUN: 14 mg/dL (ref 6–23)
CALCIUM: 8.6 mg/dL (ref 8.4–10.5)
CALCIUM: 8.6 mg/dL (ref 8.4–10.5)
CHLORIDE: 97 meq/L (ref 96–112)
CO2: 17 mEq/L — ABNORMAL LOW (ref 19–32)
CO2: 20 meq/L (ref 19–32)
CO2: 22 mEq/L (ref 19–32)
CREATININE: 0.82 mg/dL (ref 0.50–1.10)
CREATININE: 0.89 mg/dL (ref 0.50–1.10)
Calcium: 8.7 mg/dL (ref 8.4–10.5)
Chloride: 96 mEq/L (ref 96–112)
Chloride: 98 mEq/L (ref 96–112)
Creatinine, Ser: 0.87 mg/dL (ref 0.50–1.10)
GFR calc Af Amer: 84 mL/min — ABNORMAL LOW (ref >90)
GFR calc Af Amer: >90 mL/min (ref >90)
GFR calc non Af Amer: 73 mL/min — ABNORMAL LOW (ref >90)
GFR calc non Af Amer: 80 mL/min — ABNORMAL LOW (ref >90)
GFR, EST AFRICAN AMERICAN: 87 mL/min — AB (ref >90)
GFR, EST NON AFRICAN AMERICAN: 75 mL/min — AB (ref >90)
GLUCOSE: 122 mg/dL — AB (ref 70–99)
GLUCOSE: 75 mg/dL (ref 70–99)
Glucose, Bld: 84 mg/dL (ref 70–99)
POTASSIUM: 4.1 meq/L (ref 3.7–5.3)
Potassium: 3.9 mEq/L (ref 3.7–5.3)
Potassium: 4.5 mEq/L (ref 3.7–5.3)
SODIUM: 130 meq/L — AB (ref 137–147)
SODIUM: 131 meq/L — AB (ref 137–147)
Sodium: 129 mEq/L — ABNORMAL LOW (ref 137–147)

## 2013-10-08 LAB — GLUCOSE, CAPILLARY
GLUCOSE-CAPILLARY: 68 mg/dL — AB (ref 70–99)
GLUCOSE-CAPILLARY: 73 mg/dL (ref 70–99)

## 2013-10-08 MED ORDER — LEVOTHYROXINE SODIUM 112 MCG PO TABS: 112.0000 ug | ORAL_TABLET | Freq: Every day | ORAL | Status: DC

## 2013-10-08 NOTE — Progress Notes (Signed)
Discharge instructions reviewed with patient. Patient verbalizes understanding and has no questions at this time. Patient confirms all personal belongings are in her possession. Patient is waiting for ride at this time.

## 2013-10-08 NOTE — Discharge Summary (Signed)
Physician Discharge Summary  Erlinda HongLaura J Eid ZOX:[id number] DOB: [birth year] DOA: 10/04/2013  PCP: [name],[name], MD  Admit date: 10/04/2013 Discharge date: 10/08/2013  Time spent: 45 minutes  Recommendations for Outpatient Follow-up:  1. Follow up with Dr. Cliffton AstersWhite in 1 week   Recommendations for primary care physician for things to follow:  Repeat sodium levels  Discharge Diagnoses:  Principal Problem:   Hyponatremia Active Problems:   HYPOTHYROIDISM   FIBROMYALGIA   SLEEP APNEA   Pernicious anemia   Vitamin B 12 deficiency   Dehydration   Iron deficiency anemia, unspecified   SIADH (syndrome of inappropriate ADH production)  Discharge Condition: stable  Diet recommendation: regular   Filed Weights   10/05/13 0540 10/06/13 0615 10/07/13 0519  Weight: 96.843 kg (213 lb 8 oz) 96.888 kg (213 lb 9.6 oz) 97.977 kg (216 lb)   History of present illness:  Valerie Silva is a 53 y.o. female, with history of bipolar disorder, extreme anxiety, gastric bypass surgery causing iron and B12 deficiency follows with Dr. Myna HidalgoEnnever for that, chronic headaches and back pain, type 2 diabetes mellitus, hypothyroidism, osteoporosis, fibromyalgia, who is recently retired couple of many friends, contracted STD and was treated with appropriate antibiotics a few days ago. Has been having some psychological stress at home. Came to the ER today for migraine and some muscle spasms, in the ER workup was unremarkable but instantly sodium was found to be 122 and I was called to it but the patient. She currently besides generalized headaches, some muscle spasms which are diffuse, and anxiety has no subjective complaints. She denies any change in medications, denies any nausea vomiting diarrhea, does say that she does not eat or drink properly due to stress. No other subjective complaints.  Hospital Course:  Severe hyponatremia - likely multifactorial with significant free water intake at home, a component of  SIADH and being on HCTZ. Patient was fluid restricted and her Maxzide held on admission with subsequent improvement in her sodium levels. She was counseled to limit her free water intake, including coffee and tea to 4-6 cups per day, hold her Maxzide and follow up with Dr. Cliffton AstersWhite her PCP in ~1 week following discharge.  Chronic pain and anxiety - continue her home medications on discharge. Hypokalemia - replenished  hypothyroidism - Patient has persistently low TSH since last 1 year. Will reduce dose to 112 mcg daily. Migraine headaches - resumed topamax and imitrex at lower dose. Stable at present  DM - not on meds. A1C of 5. Had episode of hypoglycemia early this am. Nutrition consult appreciated. Ordered for bedtime snack  Hypotension - stable with SBP in 90s. Reports chronically running low BP  Procedures:  None    Consultations:  None   Discharge Exam: Filed Vitals:   10/07/13 0528 10/07/13 1403 10/07/13 2120 10/08/13 0613  BP: 90/53 99/62 117/64 100/66  Pulse:  80 80 63  Temp:  98.1 F (36.7 C) 97.7 F (36.5 C) 97.4 F (36.3 C)  TempSrc:  Oral Oral Oral  Resp:  17 16 16   Weight:      SpO2:  98% 98% 96%   General: NAD Cardiovascular: RRR Respiratory: CTA biL  Discharge Instructions     Medication List    STOP taking these medications       triamterene-hydrochlorothiazide 75-50 MG per tablet  Commonly known as:  MAXZIDE      TAKE these medications       acarbose 25 MG tablet  Commonly known  as:  PRECOSE  Take 25 mg by mouth daily as needed (for diabetes as indicated by prescriber.).     alprazolam 2 MG tablet  Commonly known as:  XANAX  Take 2-4 mg by mouth 2 (two) times daily as needed for sleep (Take 2mg  once daily as needed and 4 mg at bedtime as needed sleep/anxiety.).     cyclobenzaprine 10 MG tablet  Commonly known as:  FLEXERIL  Take 10 mg by mouth 3 (three) times daily as needed for muscle spasms.     diclofenac 1.3 % Ptch  Commonly known as:   FLECTOR  Place 2 patches onto the skin 2 (two) times daily.     diclofenac 75 MG EC tablet  Commonly known as:  VOLTAREN  Take 75 mg by mouth 2 (two) times daily.     escitalopram 20 MG tablet  Commonly known as:  LEXAPRO  Take 1 tablet (20 mg total) by mouth daily. For depression and anxiety.     ibuprofen 800 MG tablet  Commonly known as:  ADVIL,MOTRIN  Take 800 mg by mouth every 6 (six) hours as needed for pain.     levothyroxine 112 MCG tablet  Commonly known as:  SYNTHROID  Take 1 tablet (112 mcg total) by mouth daily before breakfast. TAKES 2 TABS = 224 MCG. EVERY DAY EXCEPT Monday-----Monday TAKES 1 TAB     loratadine 10 MG tablet  Commonly known as:  CLARITIN  Take 1 tablet (10 mg total) by mouth 2 (two) times daily.     modafinil 200 MG tablet  Commonly known as:  PROVIGIL  Take 400 mg by mouth daily. 2 tabs = 400 mg.     omeprazole 40 MG capsule  Commonly known as:  PRILOSEC  Take 1 capsule (40 mg total) by mouth daily.     OxyCODONE HCl ER 30 MG T12a  Take 30 mg by mouth 3 (three) times daily.     oxyCODONE-acetaminophen 10-325 MG per tablet  Commonly known as:  PERCOCET  Take 1 tablet by mouth 4 (four) times daily.     predniSONE 10 MG tablet  Commonly known as:  STERAPRED UNI-PAK  - Take by mouth. Take 6 tablets once daily for 2 days.   - Take 5 tablets once daily for 2 days.   - Take 4 tablets once daily for 2 days.   - Take 3 tablets once daily for 2 days.  - Take 2 tablets once daily for 2 days.  - Take 1 tablet   once daily for 2 days,  - then stop.     promethazine 25 MG tablet  Commonly known as:  PHENERGAN  Take 25 mg by mouth every 8 (eight) hours as needed. nausea     SUMAtriptan 100 MG tablet  Commonly known as:  IMITREX  Take 100 mg by mouth every 2 (two) hours as needed for migraine.     topiramate 200 MG tablet  Commonly known as:  TOPAMAX  Take 1 tablet (200 mg total) by mouth at bedtime.     valACYclovir 1000 MG tablet    Commonly known as:  VALTREX  Take 1,000 mg by mouth as needed (for flare ups).     VITAMIN B-12 IJ  Inject as directed every 28 (twenty-eight) days.     Vitamin D3 10000 UNITS capsule  Take 10,000 Units by mouth daily.       Follow-up Information   Follow up with [name], MD. Schedule an  appointment as soon as possible for a visit in 1 week.   Specialty:  Family Medicine   Contact information:   [address] [place] [address] [phone]      The results of significant diagnostics from this hospitalization (including imaging, microbiology, ancillary and laboratory) are listed below for reference.    Labs: Basic Metabolic Panel:  Recent Labs Lab 10/07/13 1656 10/07/13 2013 10/08/13 0001 10/08/13 0410 10/08/13 0805  NA 131* 131* 129* 130* 131*  K 4.5 4.2 4.5 3.9 4.1  CL 96 98 97 96 98  CO2 22 20 17* 20 22  GLUCOSE 120* 97 122* 75 84  BUN 13 12 13 13 14   CREATININE 0.89 0.88 0.82 0.89 0.87  CALCIUM 8.8 8.7 8.6 8.6 8.7   Liver Function Tests:  Recent Labs Lab 10/04/13 1336  AST 14  ALT 19  ALKPHOS 83  BILITOT 0.2*  PROT 7.1  ALBUMIN 3.8   CBC:  Recent Labs Lab 10/04/13 1336 10/05/13 0549  WBC 6.3 7.0  HGB 11.7* 9.8*  HCT 34.6* 28.9*  MCV 90.6 89.5  PLT 219 189   CBG:  Recent Labs Lab 10/07/13 0804 10/07/13 1122 10/07/13 1636 10/08/13 0751 10/08/13 1128  GLUCAP 89 85 96 68* 73   Signed:  Evolett Somarriba  [facility] 10/08/2013, 3:35 PM

## 2013-10-08 NOTE — Discharge Instructions (Signed)
You were cared for by a hospitalist during your hospital stay. If you have any questions about your discharge medications or the care you received while you were in the hospital after you are discharged, you can call the unit and asked to speak with the hospitalist on call if the hospitalist that took care of you is not available. Once you are discharged, your primary care physician will handle any further medical issues. Please note that NO REFILLS for any discharge medications will be authorized once you are discharged, as it is imperative that you return to your primary care physician (or establish a relationship with a primary care physician if you do not have one) for your aftercare needs so that they can reassess your need for medications and monitor your lab values.     If you do not have a primary care physician, you can call [phone] for a physician referral.  Follow with Primary MD Valerie Silva,Valerie S, MD in 5-7 days   Get CBC, CMP checked by your doctor and again as further instructed.  Get a 2 view Chest X ray done next visit if you had Pneumonia of Lung problems at the Hospital.  Get Medicines reviewed and adjusted.  Please request your Prim.MD to go over all Hospital Tests and Procedure/Radiological results at the follow up, please get all Hospital records sent to your Prim MD by signing hospital release before you go home.  Activity: As tolerated with Full fall precautions use walker/cane & assistance as needed  Diet: restrict water to 1500 cc / day (6 cups) Follow up with Dr. Cliffton AstersWhite in 1 week for repeat sodium level.  For Heart failure patients - Check your Weight same time everyday, if you gain over 2 pounds, or you develop in leg swelling, experience more shortness of breath or chest pain, call your Primary MD immediately. Follow Cardiac Low Salt Diet and 1.8 lit/day fluid restriction.  Disposition Home  If you experience worsening of your admission symptoms, develop shortness of  breath, life threatening emergency, suicidal or homicidal thoughts you must seek medical attention immediately by calling 911 or calling your MD immediately  if symptoms less severe.  You Must read complete instructions/literature along with all the possible adverse reactions/side effects for all the Medicines you take and that have been prescribed to you. Take any new Medicines after you have completely understood and accpet all the possible adverse reactions/side effects.   Do not drive and provide baby sitting services if your were admitted for syncope or siezures until you have seen by Primary MD or a Neurologist and advised to do so again.  Do not drive when taking Pain medications.   Do not take more than prescribed Pain, Sleep and Anxiety Medications  Special Instructions: If you have smoked or chewed Tobacco  in the last 2 yrs please stop smoking, stop any regular Alcohol  and or any Recreational drug use.  Wear Seat belts while driving.

## 2013-10-11 ENCOUNTER — Emergency Department (HOSPITAL_COMMUNITY): Payer: Medicare Other

## 2013-10-11 ENCOUNTER — Emergency Department (HOSPITAL_COMMUNITY)
Admission: EM | Admit: 2013-10-11 | Discharge: 2013-10-11 | Disposition: A | Discharge status: Home / Self Care | Payer: Medicare Other | Attending: Emergency Medicine | Admitting: Emergency Medicine

## 2013-10-11 ENCOUNTER — Encounter (HOSPITAL_COMMUNITY): Payer: Self-pay | Admitting: Emergency Medicine

## 2013-10-11 DIAGNOSIS — Y9389 Activity, other specified: Secondary | ICD-10-CM | POA: Insufficient documentation

## 2013-10-11 DIAGNOSIS — F419 Anxiety disorder, unspecified: Secondary | ICD-10-CM | POA: Insufficient documentation

## 2013-10-11 DIAGNOSIS — E119 Type 2 diabetes mellitus without complications: Secondary | ICD-10-CM | POA: Diagnosis not present

## 2013-10-11 DIAGNOSIS — Z8709 Personal history of other diseases of the respiratory system: Secondary | ICD-10-CM | POA: Insufficient documentation

## 2013-10-11 DIAGNOSIS — Y9289 Other specified places as the place of occurrence of the external cause: Secondary | ICD-10-CM | POA: Diagnosis not present

## 2013-10-11 DIAGNOSIS — Z880 Allergy status to penicillin: Secondary | ICD-10-CM | POA: Diagnosis not present

## 2013-10-11 DIAGNOSIS — S298XXA Other specified injuries of thorax, initial encounter: Secondary | ICD-10-CM | POA: Insufficient documentation

## 2013-10-11 DIAGNOSIS — S79911A Unspecified injury of right hip, initial encounter: Secondary | ICD-10-CM

## 2013-10-11 DIAGNOSIS — M810 Age-related osteoporosis without current pathological fracture: Secondary | ICD-10-CM | POA: Insufficient documentation

## 2013-10-11 DIAGNOSIS — Z79899 Other long term (current) drug therapy: Secondary | ICD-10-CM | POA: Insufficient documentation

## 2013-10-11 DIAGNOSIS — M797 Fibromyalgia: Secondary | ICD-10-CM | POA: Insufficient documentation

## 2013-10-11 DIAGNOSIS — W19XXXA Unspecified fall, initial encounter: Secondary | ICD-10-CM

## 2013-10-11 DIAGNOSIS — Z791 Long term (current) use of non-steroidal anti-inflammatories (NSAID): Secondary | ICD-10-CM | POA: Insufficient documentation

## 2013-10-11 DIAGNOSIS — D509 Iron deficiency anemia, unspecified: Secondary | ICD-10-CM | POA: Insufficient documentation

## 2013-10-11 DIAGNOSIS — E039 Hypothyroidism, unspecified: Secondary | ICD-10-CM | POA: Diagnosis not present

## 2013-10-11 DIAGNOSIS — M1990 Unspecified osteoarthritis, unspecified site: Secondary | ICD-10-CM | POA: Insufficient documentation

## 2013-10-11 DIAGNOSIS — Z9884 Bariatric surgery status: Secondary | ICD-10-CM | POA: Insufficient documentation

## 2013-10-11 DIAGNOSIS — Z7952 Long term (current) use of systemic steroids: Secondary | ICD-10-CM | POA: Insufficient documentation

## 2013-10-11 DIAGNOSIS — W01198A Fall on same level from slipping, tripping and stumbling with subsequent striking against other object, initial encounter: Secondary | ICD-10-CM | POA: Insufficient documentation

## 2013-10-11 DIAGNOSIS — F329 Major depressive disorder, single episode, unspecified: Secondary | ICD-10-CM | POA: Diagnosis not present

## 2013-10-11 LAB — BASIC METABOLIC PANEL
ANION GAP: 14 (ref 5–15)
BUN: 19 mg/dL (ref 6–23)
CO2: 19 mEq/L (ref 19–32)
Calcium: 8.4 mg/dL (ref 8.4–10.5)
Chloride: 96 mEq/L (ref 96–112)
Creatinine, Ser: 1.2 mg/dL — ABNORMAL HIGH (ref 0.50–1.10)
GFR calc Af Amer: 59 mL/min — ABNORMAL LOW (ref >90)
GFR, EST NON AFRICAN AMERICAN: 51 mL/min — AB (ref >90)
Glucose, Bld: 87 mg/dL (ref 70–99)
POTASSIUM: 4 meq/L (ref 3.7–5.3)
Sodium: 129 mEq/L — ABNORMAL LOW (ref 137–147)

## 2013-10-11 LAB — CBC WITH DIFFERENTIAL/PLATELET
BASOS ABS: 0 10*3/uL (ref 0.0–0.1)
Basophils Relative: 0 % (ref 0–1)
EOS PCT: 1 % (ref 0–5)
Eosinophils Absolute: 0.1 10*3/uL (ref 0.0–0.7)
HEMATOCRIT: 30 % — AB (ref 36.0–46.0)
Hemoglobin: 9.9 g/dL — ABNORMAL LOW (ref 12.0–15.0)
Lymphocytes Relative: 20 % (ref 12–46)
Lymphs Abs: 2 10*3/uL (ref 0.7–4.0)
MCH: 31.8 pg (ref 26.0–34.0)
MCHC: 33 g/dL (ref 30.0–36.0)
MCV: 96.5 fL (ref 78.0–100.0)
MONO ABS: 0.6 10*3/uL (ref 0.1–1.0)
Monocytes Relative: 6 % (ref 3–12)
Neutro Abs: 7.4 10*3/uL (ref 1.7–7.7)
Neutrophils Relative %: 73 % (ref 43–77)
PLATELETS: 191 10*3/uL (ref 150–400)
RBC: 3.11 MIL/uL — ABNORMAL LOW (ref 3.87–5.11)
RDW: 14 % (ref 11.5–15.5)
WBC: 10.2 10*3/uL (ref 4.0–10.5)

## 2013-10-11 LAB — I-STAT TROPONIN, ED: Troponin i, poc: 0 ng/mL (ref 0.00–0.08)

## 2013-10-11 NOTE — ED Provider Notes (Signed)
Medical screening examination/treatment/procedure(s) were performed by non-physician practitioner and as supervising physician I was immediately available for consultation/collaboration.   EKG Interpretation None        Lyanne CoKevin M Wayde Gopaul, MD 10/11/13 [phone]

## 2013-10-11 NOTE — ED Notes (Signed)
PT states that she took sleeping medicine this pm and then her dog woke her up around 1:30 to go outside; pt states that she is not sure what happened if she slipped on the wet ground or was dizzy bc of the sleeping pill but pt states that she fell backwards and struck her rt buttock / hip area and struck the back of her head; pt denies LOC; pt states that she was laying on the ground for awhile before she get up to call her son

## 2013-10-11 NOTE — Discharge Instructions (Signed)
Follow up with your doctor as needed. Return to the ED with worsening or concerning symptoms.  °

## 2013-10-11 NOTE — ED Provider Notes (Signed)
CSN: [id number]     Arrival date & time 10/11/13  0309 History   First MD Initiated Contact with Patient 10/11/13 0324     Chief Complaint  Patient presents with  . Fall     (Consider location/radiation/quality/duration/timing/severity/associated sxs/prior Treatment) HPI Comments: Patient is a 53 year old female with a past medical history of hypothyroidism, depression and anxiety, SIADH, and pernicious anemia who presents after a fall that occurred prior to arrival. Patient reports taking a sleeping pill before bed and around 0130am, her dog woke her up to go to the bathroom. Patient was taking her dog outside when she became lightheaded and fell to the ground. She reports hitting the back of her head and brief LOC. Patient also complains of right posterior rib pain and right hip pain since the fall. The pain is aching and severe without radiation. Palpation of the affected areas makes the pain worse. No other injuries or associated symptoms.    Past Medical History  Diagnosis Date  . Osteoporosis   . Hypothyroidism   . Diabetes mellitus   . Exogenous obesity   . Rhinitis   . Arthritis   . Fibromyalgia   . Somnolence   . Sleep apnea   . Anemia, iron deficiency 04/06/2011  . Pernicious anemia 04/06/2011  . Depression   . Anxiety   . Iron deficiency anemia, unspecified 12/12/2012   Past Surgical History  Procedure Laterality Date  . Gastric bypass    . Cholecystectomy    . Tracheostomy    . Uvulectomy    . Tonsillectomy    . Mandible reconstruction    . Spinal fusion    . Dilation and curettage of uterus    . Cesarean section     Family History  Problem Relation Age of Onset  . CAD Father   . Diabetes Father   . CAD Brother      X3  . Hypertension Brother   . Colon cancer Brother    History  Substance Use Topics  . Smoking status: Never Smoker   . Smokeless tobacco: Never Used     Comment: never used tobacco  . Alcohol Use: Yes     Comment: one mixed drink twice  yearly   OB History   Grav Para Term Preterm Abortions TAB SAB Ect Mult Living                 Review of Systems  Constitutional: Negative for fever, chills and fatigue.  HENT: Negative for trouble swallowing.   Eyes: Negative for visual disturbance.  Respiratory: Negative for shortness of breath.   Cardiovascular: Negative for chest pain and palpitations.  Gastrointestinal: Negative for nausea, vomiting, abdominal pain and diarrhea.  Genitourinary: Negative for dysuria and difficulty urinating.  Musculoskeletal: Positive for arthralgias and back pain. Negative for neck pain.  Skin: Negative for color change.  Neurological: Negative for dizziness and weakness.  Psychiatric/Behavioral: Negative for dysphoric mood.      Allergies  Iodine; Iron; Lovaza; Soma; Sulfonamide derivatives; Benazepril hcl; Benazepril hcl; Codeine; Penicillins; Zolpidem tartrate; Amitriptyline; and Lotensin  Home Medications   Prior to Admission medications   Medication Sig Start Date End Date Taking? Authorizing Provider  acarbose (PRECOSE) 25 MG tablet Take 25 mg by mouth daily as needed (for diabetes as indicated by prescriber.).  10/09/12   Historical Provider, MD  alprazolam Prudy Feeler) 2 MG tablet Take 2-4 mg by mouth 2 (two) times daily as needed for sleep (Take 2mg  once daily as  needed and 4 mg at bedtime as needed sleep/anxiety.).    Historical Provider, MD  Cholecalciferol (VITAMIN D3) 10000 UNITS capsule Take 10,000 Units by mouth daily.    Historical Provider, MD  Cyanocobalamin (VITAMIN B-12 IJ) Inject as directed every 28 (twenty-eight) days.    Historical Provider, MD  cyclobenzaprine (FLEXERIL) 10 MG tablet Take 10 mg by mouth 3 (three) times daily as needed for muscle spasms.    Historical Provider, MD  diclofenac (FLECTOR) 1.3 % PTCH Place 2 patches onto the skin 2 (two) times daily. 07/13/12   Fransisca Kaufmann, NP  diclofenac (VOLTAREN) 75 MG EC tablet Take 75 mg by mouth 2 (two) times daily.  07/16/12    Historical Provider, MD  escitalopram (LEXAPRO) 20 MG tablet Take 1 tablet (20 mg total) by mouth daily. For depression and anxiety. 07/13/12   Fransisca Kaufmann, NP  ibuprofen (ADVIL,MOTRIN) 800 MG tablet Take 800 mg by mouth every 6 (six) hours as needed for pain.    Historical Provider, MD  levothyroxine (SYNTHROID) 112 MCG tablet Take 1 tablet (112 mcg total) by mouth daily before breakfast. TAKES 2 TABS = 224 MCG. EVERY DAY EXCEPT Monday-----Monday TAKES 1 TAB 10/08/13   Costin Otelia Sergeant, MD  loratadine (CLARITIN) 10 MG tablet Take 1 tablet (10 mg total) by mouth 2 (two) times daily. 07/13/12   Fransisca Kaufmann, NP  modafinil (PROVIGIL) 200 MG tablet Take 400 mg by mouth daily. 2 tabs = 400 mg. 11/19/12   Historical Provider, MD  omeprazole (PRILOSEC) 40 MG capsule Take 1 capsule (40 mg total) by mouth daily. 07/13/12   Fransisca Kaufmann, NP  OxyCODONE HCl ER 30 MG T12A Take 30 mg by mouth 3 (three) times daily. 08/06/12   Josph Macho, MD  oxyCODONE-acetaminophen (PERCOCET) 10-325 MG per tablet Take 1 tablet by mouth 4 (four) times daily. 08/06/12   Josph Macho, MD  predniSONE (STERAPRED UNI-PAK) 10 MG tablet Take by mouth. Take 6 tablets once daily for 2 days.  Take 5 tablets once daily for 2 days.  Take 4 tablets once daily for 2 days.  Take 3 tablets once daily for 2 days. Take 2 tablets once daily for 2 days. Take 1 tablet   once daily for 2 days, then stop.    Historical Provider, MD  promethazine (PHENERGAN) 25 MG tablet Take 25 mg by mouth every 8 (eight) hours as needed. nausea    Historical Provider, MD  SUMAtriptan (IMITREX) 100 MG tablet Take 100 mg by mouth every 2 (two) hours as needed for migraine.  05/01/12   Historical Provider, MD  topiramate (TOPAMAX) 200 MG tablet Take 1 tablet (200 mg total) by mouth at bedtime. 07/13/12   Fransisca Kaufmann, NP  valACYclovir (VALTREX) 1000 MG tablet Take 1,000 mg by mouth as needed (for flare ups).  08/16/11   Historical Provider, MD   BP 116/70  Pulse 77   Temp(Src) 97.1 F (36.2 C) (Oral)  Resp 22  SpO2 100% Physical Exam  Nursing note and vitals reviewed. Constitutional: She is oriented to person, place, and time. She appears well-developed and well-nourished. No distress.  HENT:  Head: Normocephalic and atraumatic.  Mouth/Throat: No oropharyngeal exudate.  Eyes: Conjunctivae and EOM are normal. Pupils are equal, round, and reactive to light.  Neck: Normal range of motion.  Cardiovascular: Normal rate and regular rhythm.  Exam reveals no gallop and no friction rub.   No murmur heard. Pulmonary/Chest: Effort normal and breath sounds normal. She has no  wheezes. She has no rales. She exhibits tenderness.  Right lower posterior rib tenderness to palpation. No bruising or obvious deformity.   Abdominal: Soft. She exhibits no distension. There is no tenderness. There is no rebound and no guarding.  Musculoskeletal: Normal range of motion.  No midline spine tenderness to palpation. Right posterior hip tenderness to palpation without obvious deformity. Slightly limited ROM of right hip due to pain.   Neurological: She is alert and oriented to person, place, and time. No cranial nerve deficit. Coordination normal.  Extremity strength and sensation equal and intact bilaterally. Speech is goal-oriented. Moves limbs without ataxia.   Skin: Skin is warm and dry.  Psychiatric: She has a normal mood and affect. Her behavior is normal.    ED Course  Procedures (including critical care time) Labs Review Labs Reviewed  BASIC METABOLIC PANEL - Abnormal; Notable for the following:    Sodium 129 (*)    Creatinine, Ser 1.20 (*)    GFR calc non Af Amer 51 (*)    GFR calc Af Amer 59 (*)    All other components within normal limits  CBC WITH DIFFERENTIAL - Abnormal; Notable for the following:    RBC 3.11 (*)    Hemoglobin 9.9 (*)    HCT 30.0 (*)    All other components within normal limits  CBC WITH DIFFERENTIAL  I-STAT TROPOININ, ED    Imaging  Review Dg Ribs Unilateral W/chest Right  10/11/2013   CLINICAL DATA:  Patient fell landing on back. Pain in the right lower posterior ribs.  EXAM: RIGHT RIBS AND CHEST - 3+ VIEW  COMPARISON:  Chest 07/07/2012  FINDINGS: Normal heart size and pulmonary vascularity. No focal airspace disease or consolidation in the lungs. No blunting of costophrenic angles. No pneumothorax. Mediastinal contours appear intact. Postoperative changes in the cervical spine. Surgical clips in the right upper quadrant.  Right ribs appear intact. No displaced fractures or focal bone lesions identified. Degenerative changes in the thoracolumbar spine with previous kyphoplasty changes at L1 and L2.  IMPRESSION: No evidence of active pulmonary disease. No displaced right rib fractures.   Electronically Signed   By: Burman Nieves M.D.   On: 10/11/2013 04:38   Dg Hip Complete Right  10/11/2013   CLINICAL DATA:  Fall this morning, landed on back.  Buttock pain.  EXAM: RIGHT HIP - COMPLETE 2+ VIEW  COMPARISON:  None.  FINDINGS: There is no evidence of hip fracture or dislocation. There is no evidence of arthropathy or other focal bone abnormality. Surgical clips and phleboliths project in the pelvis. Moderate amount of partially imaged retained large bowel stool.  IMPRESSION: Negative.   Electronically Signed   By: Awilda Metro   On: 10/11/2013 04:41   Ct Head Wo Contrast  10/11/2013   CLINICAL DATA:  Larey Seat backwards while walking her dog, hit back of head. No loss of consciousness.  EXAM: CT HEAD WITHOUT CONTRAST  TECHNIQUE: Contiguous axial images were obtained from the base of the skull through the vertex without intravenous contrast.  COMPARISON:  CT of the head May 09, 2013  FINDINGS: The ventricles and sulci are normal. No intraparenchymal hemorrhage, mass effect nor midline shift. No acute large vascular territory infarcts.  No abnormal extra-axial fluid collections. Basal cisterns are patent. Trace calcific atherosclerosis  of the carotid siphons.  No skull fracture. The included ocular globes and orbital contents are non-suspicious. The mastoid aircells and included paranasal sinuses are well-aerated.  IMPRESSION: No acute intracranial process;  normal noncontrast CT of the head for age.   Electronically Signed   By: Awilda Metroourtnay  Bloomer   On: 10/11/2013 04:56     EKG Interpretation None      MDM   Final diagnoses:  Fall, initial encounter  Hip injury, right, initial encounter    3:44 AM Labs, CT head and xrays pending. Vitals stable and patient afebrile.   6:15 AM Patient's imaging and labs unremarkable for acute changes. Vitals stable and patient afebrile. Patient instructed to return to the ED with worsening or concerning symptoms. No further evaluation needed a this time.     Emilia BeckKaitlyn Shelli Portilla, [place] JerseyPA-C 10/11/13 [phone]

## 2013-10-25 ENCOUNTER — Telehealth: Payer: Self-pay | Admitting: Neurology

## 2013-10-25 ENCOUNTER — Ambulatory Visit: Payer: Self-pay | Admitting: Neurology

## 2013-10-25 NOTE — Telephone Encounter (Signed)
This patient canceled her appointment within one hour of the appointment time.

## 2013-10-31 ENCOUNTER — Emergency Department (HOSPITAL_COMMUNITY): Payer: Medicare Other

## 2013-10-31 ENCOUNTER — Encounter (HOSPITAL_COMMUNITY): Payer: Self-pay | Admitting: Emergency Medicine

## 2013-10-31 ENCOUNTER — Inpatient Hospital Stay (HOSPITAL_COMMUNITY): Payer: Medicare Other

## 2013-10-31 ENCOUNTER — Inpatient Hospital Stay (HOSPITAL_COMMUNITY)
Admission: EM | Admit: 2013-10-31 | Discharge: 2013-11-04 | DRG: 645 | Disposition: A | Discharge status: Skilled Nursing Facility | Payer: Medicare Other | Attending: Internal Medicine | Admitting: Internal Medicine

## 2013-10-31 DIAGNOSIS — M810 Age-related osteoporosis without current pathological fracture: Secondary | ICD-10-CM | POA: Diagnosis present

## 2013-10-31 DIAGNOSIS — I952 Hypotension due to drugs: Secondary | ICD-10-CM | POA: Diagnosis not present

## 2013-10-31 DIAGNOSIS — Z6833 Body mass index (BMI) 33.0-33.9, adult: Secondary | ICD-10-CM | POA: Diagnosis not present

## 2013-10-31 DIAGNOSIS — Y92239 Unspecified place in hospital as the place of occurrence of the external cause: Secondary | ICD-10-CM

## 2013-10-31 DIAGNOSIS — R0789 Other chest pain: Secondary | ICD-10-CM

## 2013-10-31 DIAGNOSIS — D509 Iron deficiency anemia, unspecified: Secondary | ICD-10-CM | POA: Diagnosis present

## 2013-10-31 DIAGNOSIS — I10 Essential (primary) hypertension: Secondary | ICD-10-CM | POA: Diagnosis present

## 2013-10-31 DIAGNOSIS — E876 Hypokalemia: Secondary | ICD-10-CM

## 2013-10-31 DIAGNOSIS — I959 Hypotension, unspecified: Secondary | ICD-10-CM

## 2013-10-31 DIAGNOSIS — M25562 Pain in left knee: Secondary | ICD-10-CM | POA: Diagnosis present

## 2013-10-31 DIAGNOSIS — D510 Vitamin B12 deficiency anemia due to intrinsic factor deficiency: Secondary | ICD-10-CM

## 2013-10-31 DIAGNOSIS — Z9884 Bariatric surgery status: Secondary | ICD-10-CM

## 2013-10-31 DIAGNOSIS — T490X5A Adverse effect of local antifungal, anti-infective and anti-inflammatory drugs, initial encounter: Secondary | ICD-10-CM | POA: Diagnosis not present

## 2013-10-31 DIAGNOSIS — M25531 Pain in right wrist: Secondary | ICD-10-CM | POA: Diagnosis present

## 2013-10-31 DIAGNOSIS — G4733 Obstructive sleep apnea (adult) (pediatric): Secondary | ICD-10-CM | POA: Diagnosis present

## 2013-10-31 DIAGNOSIS — R0902 Hypoxemia: Secondary | ICD-10-CM

## 2013-10-31 DIAGNOSIS — W19XXXA Unspecified fall, initial encounter: Secondary | ICD-10-CM

## 2013-10-31 DIAGNOSIS — Z66 Do not resuscitate: Secondary | ICD-10-CM | POA: Diagnosis present

## 2013-10-31 DIAGNOSIS — F319 Bipolar disorder, unspecified: Secondary | ICD-10-CM | POA: Diagnosis present

## 2013-10-31 DIAGNOSIS — R7989 Other specified abnormal findings of blood chemistry: Secondary | ICD-10-CM

## 2013-10-31 DIAGNOSIS — E669 Obesity, unspecified: Secondary | ICD-10-CM | POA: Diagnosis present

## 2013-10-31 DIAGNOSIS — G8929 Other chronic pain: Secondary | ICD-10-CM | POA: Diagnosis present

## 2013-10-31 DIAGNOSIS — E039 Hypothyroidism, unspecified: Secondary | ICD-10-CM | POA: Diagnosis present

## 2013-10-31 DIAGNOSIS — M1990 Unspecified osteoarthritis, unspecified site: Secondary | ICD-10-CM | POA: Diagnosis present

## 2013-10-31 DIAGNOSIS — M25561 Pain in right knee: Secondary | ICD-10-CM | POA: Diagnosis present

## 2013-10-31 DIAGNOSIS — F418 Other specified anxiety disorders: Secondary | ICD-10-CM | POA: Diagnosis present

## 2013-10-31 DIAGNOSIS — J310 Chronic rhinitis: Secondary | ICD-10-CM | POA: Diagnosis present

## 2013-10-31 DIAGNOSIS — S0083XA Contusion of other part of head, initial encounter: Secondary | ICD-10-CM | POA: Diagnosis present

## 2013-10-31 DIAGNOSIS — G9340 Encephalopathy, unspecified: Secondary | ICD-10-CM

## 2013-10-31 DIAGNOSIS — E222 Syndrome of inappropriate secretion of antidiuretic hormone: Principal | ICD-10-CM | POA: Diagnosis present

## 2013-10-31 DIAGNOSIS — G43909 Migraine, unspecified, not intractable, without status migrainosus: Secondary | ICD-10-CM | POA: Diagnosis present

## 2013-10-31 DIAGNOSIS — W010XXA Fall on same level from slipping, tripping and stumbling without subsequent striking against object, initial encounter: Secondary | ICD-10-CM | POA: Diagnosis present

## 2013-10-31 DIAGNOSIS — D649 Anemia, unspecified: Secondary | ICD-10-CM

## 2013-10-31 DIAGNOSIS — R945 Abnormal results of liver function studies: Secondary | ICD-10-CM

## 2013-10-31 DIAGNOSIS — E860 Dehydration: Secondary | ICD-10-CM | POA: Diagnosis present

## 2013-10-31 DIAGNOSIS — E538 Deficiency of other specified B group vitamins: Secondary | ICD-10-CM | POA: Diagnosis present

## 2013-10-31 DIAGNOSIS — E871 Hypo-osmolality and hyponatremia: Secondary | ICD-10-CM

## 2013-10-31 DIAGNOSIS — R0609 Other forms of dyspnea: Secondary | ICD-10-CM

## 2013-10-31 DIAGNOSIS — E162 Hypoglycemia, unspecified: Secondary | ICD-10-CM

## 2013-10-31 DIAGNOSIS — M7989 Other specified soft tissue disorders: Secondary | ICD-10-CM

## 2013-10-31 DIAGNOSIS — M797 Fibromyalgia: Secondary | ICD-10-CM | POA: Diagnosis present

## 2013-10-31 DIAGNOSIS — E119 Type 2 diabetes mellitus without complications: Secondary | ICD-10-CM | POA: Diagnosis present

## 2013-10-31 LAB — CBC WITH DIFFERENTIAL/PLATELET
BASOS ABS: 0 10*3/uL (ref 0.0–0.1)
Basophils Relative: 0 % (ref 0–1)
Eosinophils Absolute: 0 10*3/uL (ref 0.0–0.7)
Eosinophils Relative: 1 % (ref 0–5)
HCT: 30.8 % — ABNORMAL LOW (ref 36.0–46.0)
Hemoglobin: 10.7 g/dL — ABNORMAL LOW (ref 12.0–15.0)
Lymphocytes Relative: 21 % (ref 12–46)
Lymphs Abs: 1.1 10*3/uL (ref 0.7–4.0)
MCH: 31.3 pg (ref 26.0–34.0)
MCHC: 34.7 g/dL (ref 30.0–36.0)
MCV: 90.1 fL (ref 78.0–100.0)
Monocytes Absolute: 0.4 10*3/uL (ref 0.1–1.0)
Monocytes Relative: 7 % (ref 3–12)
NEUTROS ABS: 3.5 10*3/uL (ref 1.7–7.7)
NEUTROS PCT: 71 % (ref 43–77)
Platelets: 220 10*3/uL (ref 150–400)
RBC: 3.42 MIL/uL — ABNORMAL LOW (ref 3.87–5.11)
RDW: 13 % (ref 11.5–15.5)
WBC: 5 10*3/uL (ref 4.0–10.5)

## 2013-10-31 LAB — TSH: TSH: 3.74 u[IU]/mL (ref 0.350–4.500)

## 2013-10-31 LAB — BASIC METABOLIC PANEL
ANION GAP: 12 (ref 5–15)
ANION GAP: 12 (ref 5–15)
BUN: 19 mg/dL (ref 6–23)
BUN: 18 mg/dL (ref 6–23)
CALCIUM: 8.1 mg/dL — AB (ref 8.4–10.5)
CHLORIDE: 88 meq/L — AB (ref 96–112)
CO2: 21 mEq/L (ref 19–32)
CO2: 22 mEq/L (ref 19–32)
CREATININE: 0.86 mg/dL (ref 0.50–1.10)
Calcium: 8.1 mg/dL — ABNORMAL LOW (ref 8.4–10.5)
Chloride: 90 mEq/L — ABNORMAL LOW (ref 96–112)
Creatinine, Ser: 0.92 mg/dL (ref 0.50–1.10)
GFR calc Af Amer: 88 mL/min — ABNORMAL LOW (ref >90)
GFR calc non Af Amer: 70 mL/min — ABNORMAL LOW (ref >90)
GFR calc non Af Amer: 76 mL/min — ABNORMAL LOW (ref >90)
GFR, EST AFRICAN AMERICAN: 81 mL/min — AB (ref >90)
GLUCOSE: 79 mg/dL (ref 70–99)
Glucose, Bld: 86 mg/dL (ref 70–99)
POTASSIUM: 3.3 meq/L — AB (ref 3.7–5.3)
Potassium: 3.1 mEq/L — ABNORMAL LOW (ref 3.7–5.3)
SODIUM: 124 meq/L — AB (ref 137–147)
Sodium: 121 mEq/L — CL (ref 137–147)

## 2013-10-31 LAB — URINALYSIS, ROUTINE W REFLEX MICROSCOPIC
BILIRUBIN URINE: NEGATIVE
GLUCOSE, UA: NEGATIVE mg/dL
Hgb urine dipstick: NEGATIVE
Ketones, ur: NEGATIVE mg/dL
Leukocytes, UA: NEGATIVE
Nitrite: NEGATIVE
PH: 6 (ref 5.0–8.0)
Protein, ur: NEGATIVE mg/dL
Specific Gravity, Urine: 1.009 (ref 1.005–1.030)
Urobilinogen, UA: 0.2 mg/dL (ref 0.0–1.0)

## 2013-10-31 LAB — SODIUM, URINE, RANDOM: SODIUM UR: 59 meq/L

## 2013-10-31 LAB — MRSA PCR SCREENING: MRSA by PCR: NEGATIVE

## 2013-10-31 LAB — MAGNESIUM: Magnesium: 1.8 mg/dL (ref 1.5–2.5)

## 2013-10-31 LAB — OSMOLALITY, URINE: Osmolality, Ur: 267 mOsm/kg — ABNORMAL LOW (ref 390–1090)

## 2013-10-31 LAB — CREATININE, URINE, RANDOM: Creatinine, Urine: 27.7 mg/dL

## 2013-10-31 MED ORDER — VALACYCLOVIR HCL 500 MG PO TABS
1000.0000 mg | ORAL_TABLET | Freq: Every day | ORAL | Status: DC | PRN
  Filled 2013-10-31: qty 2

## 2013-10-31 MED ORDER — ONDANSETRON 8 MG PO TBDP
8.0000 mg | ORAL_TABLET | Freq: Once | ORAL | Status: AC
  Administered 2013-10-31: 8 mg via ORAL
  Filled 2013-10-31: qty 1

## 2013-10-31 MED ORDER — ONDANSETRON HCL 4 MG PO TABS: 4.0000 mg | ORAL_TABLET | Freq: Four times a day (QID) | ORAL | Status: DC | PRN

## 2013-10-31 MED ORDER — LORATADINE 10 MG PO TABS
10.0000 mg | ORAL_TABLET | Freq: Two times a day (BID) | ORAL | Status: DC
  Administered 2013-10-31 – 2013-11-04 (×8): 10 mg via ORAL
  Filled 2013-10-31 (×9): qty 1

## 2013-10-31 MED ORDER — ACETAMINOPHEN 650 MG RE SUPP: 650.0000 mg | Freq: Four times a day (QID) | RECTAL | Status: DC | PRN

## 2013-10-31 MED ORDER — ENOXAPARIN SODIUM 40 MG/0.4ML ~~LOC~~ SOLN
40.0000 mg | SUBCUTANEOUS | Status: DC
  Administered 2013-10-31 – 2013-11-03 (×4): 40 mg via SUBCUTANEOUS
  Filled 2013-10-31 (×5): qty 0.4

## 2013-10-31 MED ORDER — VITAMIN D-3 125 MCG (5000 UT) PO TABS: 2.0000 | ORAL_TABLET | Freq: Every day | ORAL | Status: DC

## 2013-10-31 MED ORDER — OXYCODONE HCL 5 MG PO TABS
5.0000 mg | ORAL_TABLET | Freq: Four times a day (QID) | ORAL | Status: DC
  Administered 2013-10-31: 5 mg via ORAL
  Filled 2013-10-31: qty 1

## 2013-10-31 MED ORDER — SODIUM CHLORIDE 0.9 % IJ SOLN
3.0000 mL | Freq: Two times a day (BID) | INTRAMUSCULAR | Status: DC
  Administered 2013-11-01 – 2013-11-04 (×5): 3 mL via INTRAVENOUS

## 2013-10-31 MED ORDER — TOPIRAMATE 100 MG PO TABS
200.0000 mg | ORAL_TABLET | Freq: Every day | ORAL | Status: DC
  Administered 2013-10-31 – 2013-11-03 (×4): 200 mg via ORAL
  Filled 2013-10-31 (×5): qty 2

## 2013-10-31 MED ORDER — ESCITALOPRAM OXALATE 20 MG PO TABS
20.0000 mg | ORAL_TABLET | Freq: Every day | ORAL | Status: DC
  Administered 2013-10-31 – 2013-11-04 (×5): 20 mg via ORAL
  Filled 2013-10-31 (×5): qty 1

## 2013-10-31 MED ORDER — ALPRAZOLAM 1 MG PO TABS
4.0000 mg | ORAL_TABLET | Freq: Every evening | ORAL | Status: DC | PRN
  Administered 2013-10-31 – 2013-11-03 (×4): 4 mg via ORAL
  Filled 2013-10-31 (×4): qty 4

## 2013-10-31 MED ORDER — POLYETHYLENE GLYCOL 3350 17 G PO PACK
17.0000 g | PACK | Freq: Every day | ORAL | Status: DC | PRN
  Filled 2013-10-31: qty 1

## 2013-10-31 MED ORDER — SORBITOL 70 % SOLN
30.0000 mL | Freq: Every day | Status: DC | PRN
Start: 2013-10-31 — End: 2013-11-04
  Filled 2013-10-31: qty 30

## 2013-10-31 MED ORDER — OXYCODONE-ACETAMINOPHEN 10-325 MG PO TABS: 1.0000 | ORAL_TABLET | Freq: Four times a day (QID) | ORAL | Status: DC

## 2013-10-31 MED ORDER — SUMATRIPTAN SUCCINATE 100 MG PO TABS
100.0000 mg | ORAL_TABLET | ORAL | Status: DC | PRN
  Administered 2013-11-01 – 2013-11-04 (×4): 100 mg via ORAL
  Filled 2013-10-31 (×5): qty 1

## 2013-10-31 MED ORDER — PANTOPRAZOLE SODIUM 40 MG PO TBEC
40.0000 mg | DELAYED_RELEASE_TABLET | Freq: Every day | ORAL | Status: DC
  Administered 2013-11-01 – 2013-11-04 (×4): 40 mg via ORAL
  Filled 2013-10-31 (×5): qty 1

## 2013-10-31 MED ORDER — SODIUM CHLORIDE 0.9 % IV BOLUS (SEPSIS)
500.0000 mL | Freq: Once | INTRAVENOUS | Status: AC
  Administered 2013-10-31: 500 mL via INTRAVENOUS

## 2013-10-31 MED ORDER — ACETAMINOPHEN 325 MG PO TABS: 650.0000 mg | ORAL_TABLET | Freq: Four times a day (QID) | ORAL | Status: DC | PRN

## 2013-10-31 MED ORDER — POTASSIUM CHLORIDE CRYS ER 20 MEQ PO TBCR
40.0000 meq | EXTENDED_RELEASE_TABLET | ORAL | Status: AC
  Administered 2013-10-31 – 2013-11-01 (×2): 40 meq via ORAL
  Filled 2013-10-31 (×2): qty 2

## 2013-10-31 MED ORDER — ALBUTEROL SULFATE (2.5 MG/3ML) 0.083% IN NEBU: 2.5000 mg | INHALATION_SOLUTION | RESPIRATORY_TRACT | Status: DC | PRN

## 2013-10-31 MED ORDER — HYDROMORPHONE HCL 2 MG/ML IJ SOLN
2.0000 mg | Freq: Once | INTRAMUSCULAR | Status: AC
  Administered 2013-10-31: 2 mg via INTRAMUSCULAR
  Filled 2013-10-31: qty 1

## 2013-10-31 MED ORDER — MAGNESIUM CITRATE PO SOLN
1.0000 | Freq: Once | ORAL | Status: AC | PRN
Start: 2013-10-31 — End: 2013-10-31

## 2013-10-31 MED ORDER — ALUM & MAG HYDROXIDE-SIMETH 200-200-20 MG/5ML PO SUSP: 30.0000 mL | Freq: Four times a day (QID) | ORAL | Status: DC | PRN

## 2013-10-31 MED ORDER — HYDROCODONE-ACETAMINOPHEN 5-325 MG PO TABS
1.0000 | ORAL_TABLET | Freq: Once | ORAL | Status: AC
  Administered 2013-10-31: 1 via ORAL
  Filled 2013-10-31: qty 1

## 2013-10-31 MED ORDER — ONDANSETRON HCL 4 MG/2ML IJ SOLN: 4.0000 mg | Freq: Four times a day (QID) | INTRAMUSCULAR | Status: DC | PRN

## 2013-10-31 MED ORDER — IPRATROPIUM BROMIDE 0.02 % IN SOLN: 0.5000 mg | RESPIRATORY_TRACT | Status: DC | PRN

## 2013-10-31 MED ORDER — SODIUM CHLORIDE 0.9 % IV SOLN
INTRAVENOUS | Status: DC
Start: 2013-10-31 — End: 2013-11-02
  Administered 2013-10-31 – 2013-11-01 (×2): via INTRAVENOUS
  Administered 2013-11-01: 125 mL via INTRAVENOUS

## 2013-10-31 MED ORDER — OXYCODONE HCL ER 30 MG PO T12A: 30.0000 mg | EXTENDED_RELEASE_TABLET | Freq: Three times a day (TID) | ORAL | Status: DC

## 2013-10-31 MED ORDER — OXYCODONE-ACETAMINOPHEN 5-325 MG PO TABS
1.0000 | ORAL_TABLET | Freq: Four times a day (QID) | ORAL | Status: DC
  Administered 2013-10-31: 1 via ORAL
  Filled 2013-10-31: qty 1

## 2013-10-31 MED ORDER — MODAFINIL 200 MG PO TABS
400.0000 mg | ORAL_TABLET | Freq: Every morning | ORAL | Status: DC
  Administered 2013-11-01 – 2013-11-04 (×4): 400 mg via ORAL
  Filled 2013-10-31 (×4): qty 2

## 2013-10-31 MED ORDER — CYCLOBENZAPRINE HCL 10 MG PO TABS
10.0000 mg | ORAL_TABLET | Freq: Three times a day (TID) | ORAL | Status: DC
  Administered 2013-10-31 – 2013-11-04 (×12): 10 mg via ORAL
  Filled 2013-10-31 (×14): qty 1

## 2013-10-31 MED ORDER — DICLOFENAC SODIUM 75 MG PO TBEC
75.0000 mg | DELAYED_RELEASE_TABLET | Freq: Two times a day (BID) | ORAL | Status: DC
  Administered 2013-10-31 – 2013-11-04 (×8): 75 mg via ORAL
  Filled 2013-10-31 (×9): qty 1

## 2013-10-31 MED ORDER — LEVOTHYROXINE SODIUM 112 MCG PO TABS
112.0000 ug | ORAL_TABLET | ORAL | Status: DC
  Administered 2013-11-04: 112 ug via ORAL
  Filled 2013-10-31: qty 1

## 2013-10-31 MED ORDER — LEVOTHYROXINE SODIUM 112 MCG PO TABS
224.0000 ug | ORAL_TABLET | ORAL | Status: DC
  Administered 2013-11-01 – 2013-11-03 (×3): 224 ug via ORAL
  Filled 2013-10-31 (×5): qty 2

## 2013-10-31 MED ORDER — IBUPROFEN 800 MG PO TABS: 800.0000 mg | ORAL_TABLET | Freq: Four times a day (QID) | ORAL | Status: DC | PRN

## 2013-10-31 MED ORDER — DOCUSATE SODIUM 100 MG PO CAPS
100.0000 mg | ORAL_CAPSULE | Freq: Two times a day (BID) | ORAL | Status: DC
  Administered 2013-11-02: 100 mg via ORAL
  Filled 2013-10-31 (×8): qty 1

## 2013-10-31 MED ORDER — VITAMIN D3 25 MCG (1000 UNIT) PO TABS
2000.0000 [IU] | ORAL_TABLET | Freq: Every day | ORAL | Status: DC
  Administered 2013-11-01 – 2013-11-04 (×4): 2000 [IU] via ORAL
  Filled 2013-10-31 (×4): qty 2

## 2013-10-31 MED ORDER — ALPRAZOLAM 1 MG PO TABS
2.0000 mg | ORAL_TABLET | Freq: Two times a day (BID) | ORAL | Status: DC | PRN
  Administered 2013-11-01 – 2013-11-03 (×3): 2 mg via ORAL
  Filled 2013-10-31 (×3): qty 2

## 2013-10-31 MED ORDER — OXYCODONE HCL ER 15 MG PO T12A
30.0000 mg | EXTENDED_RELEASE_TABLET | Freq: Three times a day (TID) | ORAL | Status: DC
  Administered 2013-10-31 – 2013-11-02 (×4): 30 mg via ORAL
  Administered 2013-11-02: 15 mg via ORAL
  Administered 2013-11-03 – 2013-11-04 (×5): 30 mg via ORAL
  Filled 2013-10-31 (×11): qty 2

## 2013-10-31 NOTE — ED Notes (Signed)
Bed: WA20 Expected date:  Expected time:  Means of arrival:  Comments: EMS-fall 

## 2013-10-31 NOTE — ED Notes (Signed)
Ortho tech made aware of new orders placed.

## 2013-10-31 NOTE — ED Notes (Signed)
Bed: WA03 Expected date:  Expected time:  Means of arrival:  Comments: EMS-fall-shoulder pain

## 2013-10-31 NOTE — H&P (Signed)
[facility] History and Physical  Valerie Silva ZOX:[id number] DOB: 03-May-1960 DOA: 10/31/2013  Referring physician: Dr. Soledad Gerlach PCP: [name], MD   Chief Complaint: Fall  HPI: Valerie Silva is a 53 y.o. 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 Patient was recently hospitalized 10/04/2013 to 10/08/2052 with severe hyponatremia which was felt to be multifactorial at that time secondary to SIADH and diuretics as well as significant free water intake. Patient does state that she was coming out of the door with her dog on a leash when the dog took off and she fell on her face down 3 feet onto the concrete ground. Patient subsequently presented to the ED. Patient denies any fevers, no chills, no nausea, no vomiting, no abdominal pain, no chest pain, no shortness of breath, no constipation, no dysuria. Patient stated had an episode of diarrhea about a week ago which has since resolved. Patient was complaining of left and right knee pain as well as right hand pain. Patient also endorses generalized weakness. Patient was seen in the emergency room plain films of the tibia and fibula negative for fracture. Compressive metabolic profile which was obtained at a sodium of 122. CT head which was done was negative. [facility] were consulted for admission.   Review of Systems: As per history of present illness otherwise negative. Constitutional:  No weight loss, night sweats, Fevers, chills, fatigue.  HEENT:  No headaches, Difficulty swallowing,Tooth/dental problems,Sore throat,  No sneezing, itching, ear ache,  nasal congestion, post nasal drip,  Cardio-vascular:  No chest pain, Orthopnea, PND, swelling in lower extremities, anasarca, dizziness, palpitations  GI:  No heartburn, indigestion, abdominal pain, nausea, vomiting, diarrhea, change in bowel habits, loss of appetite  Resp:  No shortness of breath with exertion or at rest. No excess mucus, no productive cough, No non-productive cough, No coughing up of blood.No change in color of mucus.No wheezing.No chest wall deformity  Skin:  no rash or lesions.  GU:  no dysuria, change in color of urine, no urgency or frequency. No flank pain.  Musculoskeletal:  No joint pain or swelling. No decreased range of motion. No back pain.  Psych:  No change in mood or affect. No depression or anxiety. No memory loss.   Past Medical History  Diagnosis Date  . Osteoporosis   . Hypothyroidism   . Diabetes mellitus   . Exogenous obesity   . Rhinitis   . Arthritis   . Fibromyalgia   . Somnolence   . Sleep apnea   . Anemia, iron deficiency 04/06/2011  . Pernicious anemia 04/06/2011  . Depression   . Anxiety   . Iron deficiency anemia, unspecified 12/12/2012   Past Surgical History  Procedure Laterality Date  . Gastric bypass    . Cholecystectomy    . Tracheostomy    . Uvulectomy    . Tonsillectomy    . Mandible reconstruction    . Spinal fusion    . Dilation and curettage of uterus    . Cesarean section     Social History:  reports that she has never smoked. She has never used smokeless tobacco. She reports that she drinks alcohol. She  reports that she does not use illicit drugs.  Allergies  Allergen Reactions  . Iodine Other (See Comments)    Sensitive when used gynecologically (intra-vaginally).  . Iron Itching    Iron taken by mouth causes this reaction. The patient has had several iron infusions plus Feraheme that has not caused any side effects.   . Lovaza [Omega-3-Acid Ethyl Esters] Itching  . Soma [Carisoprodol] Other (See Comments)      Pt states that this medication causes panic attacks.   . Sulfonamide Derivatives Other (See Comments)    Sensitive when used intra-vaginally.  . Benazepril Hcl Other (See Comments)    Reaction:  Severe cough   . Codeine Other (See Comments)    Pt states that this medication causes rapid heartbeat.   Marland Kitchen Penicillins Other (See Comments)    Reaction: Severe family allergy.  Family physician long ago stated she shouldn't take this med since mother and brother nearly died from anaphylaxis.  Marland Kitchen Zolpidem Tartrate Itching and Other (See Comments)    Pt states that this medication causes blackouts.   . Amitriptyline Other (See Comments)    Pt states that this medication causes sleep walking.     Family History  Problem Relation Age of Onset  . CAD Father   . Diabetes Father   . CAD Brother      X3  . Hypertension Brother   . Colon cancer Brother      Prior to Admission medications   Medication Sig Start Date End Date Taking? Authorizing Provider  acarbose (PRECOSE) 25 MG tablet Take 25 mg by mouth daily as needed (for low blood sugar).  10/09/12  Yes Historical Provider, MD  alprazolam Prudy Feeler) 2 MG tablet Take 2-4 mg by mouth 3 (three) times daily as needed for sleep or anxiety (Pt takes 1 tablet twice a day during the day and 2 tablets at night.).    Yes Historical Provider, MD  Cholecalciferol (VITAMIN D-3) 5000 UNITS TABS Take 2 tablets by mouth daily.   Yes Historical Provider, MD  Cyanocobalamin (VITAMIN B-12 IJ) Inject as directed every 28 (twenty-eight) days.   Yes Historical Provider, MD  cyclobenzaprine (FLEXERIL) 10 MG tablet Take 10 mg by mouth 3 (three) times daily.    Yes Historical Provider, MD  diclofenac (VOLTAREN) 75 MG EC tablet Take 75 mg by mouth 2 (two) times daily.  07/16/12  Yes Historical Provider, MD  escitalopram (LEXAPRO) 20 MG tablet Take 1 tablet (20 mg total) by mouth daily. For depression and anxiety. 07/13/12  Yes Fransisca Kaufmann, NP  ibuprofen (ADVIL,MOTRIN) 800 MG  tablet Take 800 mg by mouth every 6 (six) hours as needed for pain.   Yes Historical Provider, MD  levothyroxine (SYNTHROID, LEVOTHROID) 112 MCG tablet Take 112-224 mcg by mouth daily before breakfast. Pt takes every day, but on Monday she takes 112 mcg.   Yes Historical Provider, MD  loratadine (CLARITIN) 10 MG tablet Take 1 tablet (10 mg total) by mouth 2 (two) times daily. 07/13/12  Yes Fransisca Kaufmann, NP  modafinil (PROVIGIL) 200 MG tablet Take 400 mg by mouth every morning.  11/19/12  Yes Historical Provider, MD  omeprazole (PRILOSEC) 40 MG capsule Take 1 capsule (40 mg total) by mouth daily. 07/13/12  Yes Fransisca Kaufmann, NP  OxyCODONE HCl ER 30 MG T12A Take 30 mg by mouth 3 (three) times daily. 08/06/12  Yes Josph Macho, MD  oxyCODONE-acetaminophen (PERCOCET) 10-325 MG per tablet Take 1 tablet by mouth 4 (four)  times daily. 08/06/12  Yes Josph MachoPeter R Ennever, MD  promethazine (PHENERGAN) 25 MG tablet Take 25 mg by mouth every 8 (eight) hours as needed for nausea.    Yes Historical Provider, MD  SUMAtriptan (IMITREX) 100 MG tablet Take 100 mg by mouth every 2 (two) hours as needed for migraine.  05/01/12  Yes Historical Provider, MD  topiramate (TOPAMAX) 200 MG tablet Take 1 tablet (200 mg total) by mouth at bedtime. 07/13/12  Yes Fransisca KaufmannLaura Davis, NP  valACYclovir (VALTREX) 1000 MG tablet Take 1,000 mg by mouth daily as needed (for flare ups).  08/16/11  Yes Historical Provider, MD   Physical Exam: Filed Vitals:   10/31/13 1037 10/31/13 1250 10/31/13 1418 10/31/13 1648  BP: 136/87 99/49 132/88 91/61  Pulse: 76 81 77 78  Temp: 98.5 F (36.9 C)     TempSrc: Oral     Resp: 18 19 17 17   SpO2: 100% 95% 99% 96%    Wt Readings from Last 3 Encounters:  10/07/13 97.977 kg (216 lb)  09/30/13 98.884 kg (218 lb)  07/01/13 107.049 kg (236 lb)    General:  Well-developed well-nourished laying on the gurney in no acute cardiopulmonary distress with a hematoma noted on the right frontal head laceration on the  lip and a small hematoma in the corner of the right eye. Patient is speaking in full sentences. Appears calm and comfortable Eyes: PERRLA,EOMI, normal lids, irises & conjunctiva ENT: grossly normal hearing, lips & tongue Neck: no LAD, masses or thyromegaly Cardiovascular: RRR, no m/r/g. No LE edema. Respiratory: CTA bilaterally, no w/r/r. Normal respiratory effort. Abdomen: soft, ntnd, positive bowel sounds, no rebound, no guarding Skin: no rash or induration seen on limited exam Musculoskeletal: grossly normal tone BUE/BLE Psychiatric: grossly normal mood and affect, speech fluent and appropriate Neurologic: Alert and oriented x3. Cranial nerves II through XII are grossly intact. No focal deficits.           Labs on Admission:  Basic Metabolic Panel:  Recent Labs Lab 10/31/13 1356  NA 121*  K 3.3*  CL 88*  CO2 21  GLUCOSE 86  BUN 19  CREATININE 0.92  CALCIUM 8.1*   Liver Function Tests: No results found for this basename: AST, ALT, ALKPHOS, BILITOT, PROT, ALBUMIN,  in the last 168 hours No results found for this basename: LIPASE, AMYLASE,  in the last 168 hours No results found for this basename: AMMONIA,  in the last 168 hours CBC:  Recent Labs Lab 10/31/13 1356  WBC 5.0  NEUTROABS 3.5  HGB 10.7*  HCT 30.8*  MCV 90.1  PLT 220   Cardiac Enzymes: No results found for this basename: CKTOTAL, CKMB, CKMBINDEX, TROPONINI,  in the last 168 hours  BNP (last 3 results) No results found for this basename: PROBNP,  in the last 8760 hours CBG: No results found for this basename: GLUCAP,  in the last 168 hours  Radiological Exams on Admission: Dg Femur Left  10/31/2013   CLINICAL DATA:  Pt's dog pulled her down while she was holding leash today.Pt fell on concrete.Left mid femur pain. Left mid tib/fib pain. Some bruising at mid tib/fib.  EXAM: LEFT FEMUR - 2 VIEW  COMPARISON:  None.  FINDINGS: There is no evidence of fracture or other focal bone lesions. Soft tissues are  unremarkable.  IMPRESSION: No acute osseous injury of the left femur.   Electronically Signed   By: Elige KoHetal  Patel   On: 10/31/2013 12:40   Dg Tibia/fibula Left  10/31/2013   CLINICAL DATA:  Fall.  EXAM: LEFT TIBIA AND FIBULA - 2 VIEW  COMPARISON:  None.  FINDINGS: There is no evidence of fracture or other focal bone lesions. Soft tissues are unremarkable.  IMPRESSION: Normal left tibia and fibula.   Electronically Signed   By: Roque LiasJames  Green M.D.   On: 10/31/2013 12:41   Ct Head Wo Contrast  10/31/2013   CLINICAL DATA:  Status post fall today. Hematoma right forehead. Initial encounter.  EXAM: CT HEAD WITHOUT CONTRAST  TECHNIQUE: Contiguous axial images were obtained from the base of the skull through the vertex without intravenous contrast.  COMPARISON:  Head CT scan 10/11/2013 and 05/09/2013 be  FINDINGS: Hematoma is seen over the right frontal bone. There is no underlying fracture. The brain appears normal without hemorrhage, infarct, mass lesion, mass effect, midline shift or abnormal extra-axial fluid collection. No hydrocephalus or pneumocephalus. Imaged paranasal sinuses and mastoid air cells are clear.  IMPRESSION: Hematoma over the right frontal bone without underlying fracture or intracranial abnormality.   Electronically Signed   By: Drusilla Kannerhomas  Dalessio M.D.   On: 10/31/2013 12:21    EKG: None  Assessment/Plan Principal Problem:   Hyponatremia Active Problems:   Hypothyroidism   Vitamin B 12 deficiency   Depression with anxiety   Dehydration   Hypokalemia   Iron deficiency anemia   SIADH (syndrome of inappropriate ADH production)  #1 severe hyponatremia Questionable etiology. Differential includes volume depletion in the setting of possible SIADH versus adrenal insufficiency and hypothyroidism. Patient is on SSRI for depression anxiety and bipolar disorder which would have to continue at this time which may be contributing. Patient's diuretics were discontinued during last  hospitalization. Patient states she's been compliant with all her medications and following the fluid restriction. Per records patient was on steroids which per patient were discontinued during last admission. Will admit the patient to step down unit. Will check a fractional excretion of sodium. Check a UA with cultures and sensitivities. Check a random cortisol level. Check a TSH. Check a chest x-ray. Check a magnesium level. Check a urine osmolality. Check a serum osmolality. CT of the head which was done was negative. Will place on IV fluids as well as fluid restriction. Serial basic metabolic profiles. Follow. If no improvement will need to consult with nephrology for further evaluation and management.  #2 hypothyroidism Check a TSH. Continue home dose Synthroid.  #3 bipolar disorder/depression/anxiety Currently stable. Continue home regimen of Lexapro, Xanax, Provigil.  #4 iron deficiency anemia and B12 deficiency Secondary to gastric bypass. Patient is being followed by hematology as outpatient.  #5 migraine headaches Stable. Continue home regimen of Topamax. Imitrex as needed.  #6 hypokalemia Check a magnesium level. Replete.  #7 fall Will place on fall precautions. PT/OT.  #8 prophylaxis PPI for GI prophylaxis. Lovenox for DVT prophylaxis.  Code Status: DO NOT RESUSCITATE DVT Prophylaxis: Lovenox Family Communication: Updated patient no family at bedside. Disposition Plan: Admit to SDU  Time spent: 4865 MINS  Cesc LLCHOMPSON,DANIEL MD [facility] Pager [phone]

## 2013-10-31 NOTE — ED Notes (Signed)
Per EMS-pt fall this am, dog pulled her down. Fell face forward, hematoma to left head and laceration to hip. Denies LOC.

## 2013-10-31 NOTE — ED Notes (Signed)
Made [facility] EDP aware of critical sodium.

## 2013-11-01 DIAGNOSIS — E222 Syndrome of inappropriate secretion of antidiuretic hormone: Principal | ICD-10-CM

## 2013-11-01 LAB — URINE CULTURE
COLONY COUNT: NO GROWTH
CULTURE: NO GROWTH

## 2013-11-01 LAB — BASIC METABOLIC PANEL
ANION GAP: 10 (ref 5–15)
ANION GAP: 9 (ref 5–15)
Anion gap: 11 (ref 5–15)
BUN: 16 mg/dL (ref 6–23)
BUN: 13 mg/dL (ref 6–23)
BUN: 13 mg/dL (ref 6–23)
CALCIUM: 7.4 mg/dL — AB (ref 8.4–10.5)
CHLORIDE: 94 meq/L — AB (ref 96–112)
CO2: 20 meq/L (ref 19–32)
CO2: 21 mEq/L (ref 19–32)
CO2: 21 mEq/L (ref 19–32)
CREATININE: 0.89 mg/dL (ref 0.50–1.10)
Calcium: 7.8 mg/dL — ABNORMAL LOW (ref 8.4–10.5)
Calcium: 7.8 mg/dL — ABNORMAL LOW (ref 8.4–10.5)
Chloride: 94 mEq/L — ABNORMAL LOW (ref 96–112)
Chloride: 98 mEq/L (ref 96–112)
Creatinine, Ser: 0.78 mg/dL (ref 0.50–1.10)
Creatinine, Ser: 0.88 mg/dL (ref 0.50–1.10)
GFR calc Af Amer: 84 mL/min — ABNORMAL LOW (ref >90)
GFR calc non Af Amer: 73 mL/min — ABNORMAL LOW (ref >90)
GFR, EST AFRICAN AMERICAN: 85 mL/min — AB (ref >90)
GFR, EST NON AFRICAN AMERICAN: 74 mL/min — AB (ref >90)
GLUCOSE: 79 mg/dL (ref 70–99)
Glucose, Bld: 80 mg/dL (ref 70–99)
Glucose, Bld: 82 mg/dL (ref 70–99)
POTASSIUM: 4.1 meq/L (ref 3.7–5.3)
Potassium: 3.4 mEq/L — ABNORMAL LOW (ref 3.7–5.3)
Potassium: 4.1 mEq/L (ref 3.7–5.3)
SODIUM: 125 meq/L — AB (ref 137–147)
SODIUM: 128 meq/L — AB (ref 137–147)
Sodium: 125 mEq/L — ABNORMAL LOW (ref 137–147)

## 2013-11-01 LAB — CBC
HCT: 28 % — ABNORMAL LOW (ref 36.0–46.0)
HEMOGLOBIN: 9.6 g/dL — AB (ref 12.0–15.0)
MCH: 31.2 pg (ref 26.0–34.0)
MCHC: 34.3 g/dL (ref 30.0–36.0)
MCV: 90.9 fL (ref 78.0–100.0)
Platelets: 180 10*3/uL (ref 150–400)
RBC: 3.08 MIL/uL — ABNORMAL LOW (ref 3.87–5.11)
RDW: 13.2 % (ref 11.5–15.5)
WBC: 3.6 10*3/uL — ABNORMAL LOW (ref 4.0–10.5)

## 2013-11-01 LAB — OSMOLALITY: Osmolality: 255 mOsm/kg — ABNORMAL LOW (ref 275–300)

## 2013-11-01 LAB — CORTISOL: Cortisol, Plasma: 7.2 ug/dL

## 2013-11-01 MED ORDER — IBUPROFEN 800 MG PO TABS
800.0000 mg | ORAL_TABLET | Freq: Four times a day (QID) | ORAL | Status: DC | PRN
  Administered 2013-11-03 (×2): 800 mg via ORAL
  Filled 2013-11-01 (×2): qty 1

## 2013-11-01 MED ORDER — SODIUM CHLORIDE 1 G PO TABS
2.0000 g | ORAL_TABLET | Freq: Two times a day (BID) | ORAL | Status: DC
  Administered 2013-11-01 – 2013-11-02 (×3): 2 g via ORAL
  Filled 2013-11-01 (×6): qty 2

## 2013-11-01 MED ORDER — COSYNTROPIN 0.25 MG IJ SOLR
0.2500 mg | Freq: Once | INTRAMUSCULAR | Status: AC
  Administered 2013-11-02: 0.25 mg via INTRAVENOUS
  Filled 2013-11-01: qty 0.25

## 2013-11-01 MED ORDER — ALBUTEROL SULFATE (2.5 MG/3ML) 0.083% IN NEBU: 2.5000 mg | INHALATION_SOLUTION | RESPIRATORY_TRACT | Status: DC | PRN

## 2013-11-01 MED ORDER — OXYCODONE-ACETAMINOPHEN 5-325 MG PO TABS
1.0000 | ORAL_TABLET | Freq: Four times a day (QID) | ORAL | Status: DC
  Administered 2013-11-01 (×2): 1 via ORAL
  Filled 2013-11-01 (×2): qty 1

## 2013-11-01 MED ORDER — VALACYCLOVIR HCL 500 MG PO TABS
1000.0000 mg | ORAL_TABLET | Freq: Every day | ORAL | Status: DC | PRN
  Filled 2013-11-01 (×2): qty 2

## 2013-11-01 MED ORDER — IPRATROPIUM BROMIDE 0.02 % IN SOLN: 0.5000 mg | RESPIRATORY_TRACT | Status: DC | PRN

## 2013-11-01 MED ORDER — OXYCODONE HCL 5 MG PO TABS
5.0000 mg | ORAL_TABLET | Freq: Four times a day (QID) | ORAL | Status: DC
  Administered 2013-11-01 (×2): 5 mg via ORAL
  Filled 2013-11-01 (×2): qty 1

## 2013-11-01 MED ORDER — SODIUM CHLORIDE 0.9 % IV BOLUS (SEPSIS)
750.0000 mL | Freq: Once | INTRAVENOUS | Status: AC
  Administered 2013-11-01: 750 mL via INTRAVENOUS

## 2013-11-01 NOTE — Progress Notes (Signed)
Orthopedic Tech Progress Note Patient Details:  Erlinda HongLaura J Fangman [birth year] [id number] Left crutches for pt. with pt.'s nurse. Patient ID: Erlinda HongLaura J Christopher, female   DOB: [birth year], 53 y.o.   MRN: [id number]   Lesle ChrisGilliland, Kori Goins L 11/01/2013, 10:26 AM

## 2013-11-01 NOTE — Progress Notes (Signed)
[facility] PROGRESS NOTE  Valerie HongLaura J Silva ZOX:[id number] DOB: [birth year] DOA: 10/31/2013 PCP: [name],[name], MD  Assessment/Plan  Severe hypoosmolar hyponatremia, unclear etiology but improving with IVF suggesting that if SIADH is contributing that there are other contributing factors also -  Cortisol somewhat low given degree of hypotension overnight -  Cosyntropin stim test in AM -  TSH 3.64 -  Urine and serum tests suggestive of SIADH, but sodium improving with IVF -  Consider tapering lexapro -  CXR unremarkable -  Continue q4h sodium  Hypotension overnight without evidence of sepsis, hemorrhage.  Suspect she may have some adrenal insufficiency -  stim test as above -  Monitor for bleeding or signs of infection  Hypothyroidism, TSH 3.64, continue synthroid.   Bipolar disorder/depression/anxiety, stable. Continue home regimen of Lexapro, Xanax, Provigil.  -  Recommend tapering lexapro if possible  Iron deficiency anemia and B12 deficiency, secondary to gastric bypass. Patient is being followed by hematology as outpatient.   Migraine headaches, stable. Continue home regimen of Topamax. Imitrex as needed.   Hypokalemia, magnesium level wnl, trending up with repletion.     Fall -   PT/OT.  Diet:  regular Access:  PIV IVF:  yes Proph:  lovenox  Code Status: DNR Family Communication: patient and her son Disposition Plan: pending stable blood pressure and improvement in sodium   Consultants:  none  Procedures:  CT head  XR tibia/fibula/femur left  XR right wrist  CXR  Antibiotics:  none   HPI/Subjective:  Continues to have diffuse body pains, right wrist, left knee, back, face   Objective: Filed Vitals:   11/01/13 0400 11/01/13 0500 11/01/13 0600 11/01/13 0805  BP: 82/54 91/59 89/59  109/86  Pulse: 59 61 58   Temp: 98.7 F (37.1 C)     TempSrc: Axillary     Resp: 9 10 10 17   Height:      Weight:   102.9 kg (226 lb 13.7 oz)   SpO2: 100%  100% 100% 99%    Intake/Output Summary (Last 24 hours) at 11/01/13 0911 Last data filed at 11/01/13 0800  Gross per 24 hour  Intake   2495 ml  Output   2600 ml  Net   -105 ml   Filed Weights   10/31/13 1800 11/01/13 0600  Weight: 97.4 kg (214 lb 11.7 oz) 102.9 kg (226 lb 13.7 oz)    Exam:   General:  WF, No acute distress  HEENT:  Obvious swelling and bruising mostly in right face above right eye, MMM, trach scar  Cardiovascular:  RRR, nl S1, S2 no mrg, 2+ pulses, warm extremities  Respiratory:  CTAB, no increased WOB  Abdomen:   NABS, soft, NT/ND  MSK:   Normal tone and bulk, no LEE, left leg in splint  Neuro:  Grossly intact  Data Reviewed: Basic Metabolic Panel:  Recent Labs Lab 10/31/13 1352 10/31/13 1356 10/31/13 2015 11/01/13 0330  NA  --  121* 124* 125*  K  --  3.3* 3.1* 3.4*  CL  --  88* 90* 94*  CO2  --  21 22 20   GLUCOSE  --  86 79 79  BUN  --  19 18 16   CREATININE  --  0.92 0.86 0.89  CALCIUM  --  8.1* 8.1* 7.4*  MG 1.8  --   --   --    Liver Function Tests: No results found for this basename: AST, ALT, ALKPHOS, BILITOT, PROT, ALBUMIN,  in the last 168 hours  No results found for this basename: LIPASE, AMYLASE,  in the last 168 hours No results found for this basename: AMMONIA,  in the last 168 hours CBC:  Recent Labs Lab 10/31/13 1356 11/01/13 0330  WBC 5.0 3.6*  NEUTROABS 3.5  --   HGB 10.7* 9.6*  HCT 30.8* 28.0*  MCV 90.1 90.9  PLT 220 180   Cardiac Enzymes: No results found for this basename: CKTOTAL, CKMB, CKMBINDEX, TROPONINI,  in the last 168 hours BNP (last 3 results) No results found for this basename: PROBNP,  in the last 8760 hours CBG: No results found for this basename: GLUCAP,  in the last 168 hours  Recent Results (from the past 240 hour(s))  MRSA PCR SCREENING     Status: None   Collection Time    10/31/13  6:30 PM      Result Value Ref Range Status   MRSA by PCR NEGATIVE  NEGATIVE Final   Comment:             The GeneXpert MRSA Assay (FDA     approved for NASAL specimens     only), is one component of a     comprehensive MRSA colonization     surveillance program. It is not     intended to diagnose MRSA     infection nor to guide or     monitor treatment for     MRSA infections.     Performed at [facility]     Studies: X-ray Chest Pa And Lateral  10/31/2013   CLINICAL DATA:  53 year old female status post fall today outside, Face hematoma. Discovered to have critical abnormal sodium levels. Initial encounter.  EXAM: CHEST  2 VIEW  COMPARISON:  Chest radiographs 10/11/2013 and earlier.  FINDINGS: Upright AP and lateral views of the chest. Stable and normal lung volumes. Normal cardiac size and mediastinal contours. Visualized tracheal air column is within normal limits. No pneumothorax, pulmonary edema, pleural effusion or confluent pulmonary opacity. Cervical ACDF hardware partially visible. Augmented upper lumbar compression fractures partially visible. No acute osseous abnormality identified. Stable ventral abdominal surgical clips.  IMPRESSION: No acute cardiopulmonary abnormality or acute traumatic injury identified.   Electronically Signed   By: Augusto Gamble M.D.   On: 10/31/2013 21:49   Dg Wrist Complete Right  10/31/2013   CLINICAL DATA:  Generalized wrist pain after falling today. Initial encounter.  EXAM: RIGHT WRIST - COMPLETE 3+ VIEW  COMPARISON:  None.  FINDINGS: The bones appear mildly demineralized. There is no evidence of acute fracture or dislocation. The joint spaces are maintained. There is no evidence of focal soft tissue swelling.  IMPRESSION: No acute osseous findings.   Electronically Signed   By: Roxy Horseman M.D.   On: 10/31/2013 20:03   Dg Femur Left  10/31/2013   CLINICAL DATA:  Pt's dog pulled her down while she was holding leash today.Pt fell on concrete.Left mid femur pain. Left mid tib/fib pain. Some bruising at mid tib/fib.  EXAM: LEFT FEMUR - 2 VIEW   COMPARISON:  None.  FINDINGS: There is no evidence of fracture or other focal bone lesions. Soft tissues are unremarkable.  IMPRESSION: No acute osseous injury of the left femur.   Electronically Signed   By: Elige Ko   On: 10/31/2013 12:40   Dg Tibia/fibula Left  10/31/2013   CLINICAL DATA:  Fall.  EXAM: LEFT TIBIA AND FIBULA - 2 VIEW  COMPARISON:  None.  FINDINGS: There is no evidence  of fracture or other focal bone lesions. Soft tissues are unremarkable.  IMPRESSION: Normal left tibia and fibula.   Electronically Signed   By: Roque LiasJames  Green M.D.   On: 10/31/2013 12:41   Ct Head Wo Contrast  10/31/2013   CLINICAL DATA:  Status post fall today. Hematoma right forehead. Initial encounter.  EXAM: CT HEAD WITHOUT CONTRAST  TECHNIQUE: Contiguous axial images were obtained from the base of the skull through the vertex without intravenous contrast.  COMPARISON:  Head CT scan 10/11/2013 and 05/09/2013 be  FINDINGS: Hematoma is seen over the right frontal bone. There is no underlying fracture. The brain appears normal without hemorrhage, infarct, mass lesion, mass effect, midline shift or abnormal extra-axial fluid collection. No hydrocephalus or pneumocephalus. Imaged paranasal sinuses and mastoid air cells are clear.  IMPRESSION: Hematoma over the right frontal bone without underlying fracture or intracranial abnormality.   Electronically Signed   By: Drusilla Kannerhomas  Dalessio M.D.   On: 10/31/2013 12:21    Scheduled Meds: . cholecalciferol  2,000 Units Oral Daily  . cyclobenzaprine  10 mg Oral TID  . diclofenac  75 mg Oral BID  . docusate sodium  100 mg Oral BID  . enoxaparin (LOVENOX) injection  40 mg Subcutaneous Q24H  . escitalopram  20 mg Oral Daily  . [START ON 11/04/2013] levothyroxine  112 mcg Oral Once per day on Mon  . levothyroxine  224 mcg Oral Once per day on Sun Tue Wed Thu Fri Sat  . loratadine  10 mg Oral BID  . modafinil  400 mg Oral q morning - 10a  . oxyCODONE-acetaminophen  1 tablet  Oral QID   And  . oxyCODONE  5 mg Oral QID  . OxyCODONE  30 mg Oral 3 times per day  . pantoprazole  40 mg Oral Q0600  . sodium chloride  3 mL Intravenous Q12H  . topiramate  200 mg Oral QHS   Continuous Infusions: . sodium chloride 125 mL (11/01/13 0235)    Principal Problem:   Hyponatremia Active Problems:   Hypothyroidism   Vitamin B 12 deficiency   Depression with anxiety   Dehydration   Hypokalemia   Iron deficiency anemia   SIADH (syndrome of inappropriate ADH production)    Time spent: 30 min    Kailia Starry  [facility] Pager [phone]. If 7PM-7AM, please contact night-coverage at [email or web address], password [email or web address] 11/01/2013, 9:11 AM  LOS: 1 day

## 2013-11-01 NOTE — Consult Note (Signed)
Renal Service Consult Note [facility]  Erlinda HongLaura J Hanel 11/01/2013 Copelyn Widmer D Requesting Physician:  Dr Malachi BondsShort  Reason for Consult:  Hyponatremia HPI: The patient is a 53 y.o. year-old with hx of DM, depression, anxiety, bipolar disorder, FM, OSA, and hypothyroidism. She was here in September with Na 122 that improved off of HCTZ.  This time patient fell outside walking the dog and hit her face.  Na was 121. Pt was admitted and given NaCl.  Na today is 128.  Creat 0.88.  Hb 9.6.  WBC 3.6, plts 180.     Chart review: 08 - upper GIB, obesity, OSA, psych illness 13 - chest pain, normal echo, ruled out. Rx with PPI 13 - hyponatremia on PCP annual labs and low BS.  Admitted with severe hypoglycemia, unclear cause. Cortisol low at 1.8, suspected AI.  [facility] HCTZ stopped for low Na.  Na 132 at dc. 5/14 - hypotension, random cortisol was 15.3, normal ACTH stim test 6/14 - intentional BZD overdose. Hx chronic depression.  [facility] above 9[facility]25- 10/08/13 > in ED for HA and found to have Na 122. 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  Limit water intake. Dc'd home.      ROS  no nsaid's  has been on SSRI for many years per pt  no SOB, cough or CP  no abd pain, n/v/d  no dysuria  Past Medical History  Past Medical History  Diagnosis Date  . Osteoporosis   . Hypothyroidism   . Diabetes mellitus   . Exogenous obesity   . Rhinitis   . Arthritis   . Fibromyalgia   . Somnolence   . Sleep apnea   . Anemia, iron deficiency 04/06/2011  . Pernicious anemia 04/06/2011  . Depression   . Anxiety   . Iron deficiency anemia, unspecified 12/12/2012   Past Surgical History  Past Surgical History  Procedure Laterality Date  . Gastric bypass    . Cholecystectomy    . Tracheostomy    . Uvulectomy    . Tonsillectomy    . Mandible reconstruction    . Spinal fusion    . Dilation and curettage of uterus    . Cesarean section      Family History  Family History  Problem Relation Age of Onset  . CAD Father   . Diabetes Father   . CAD Brother      X3  . Hypertension Brother   . Colon cancer Brother    Social History  reports that she has never smoked. She has never used smokeless tobacco. She reports that she drinks alcohol. She reports that she does not use illicit drugs. Allergies  Allergies  Allergen Reactions  . Iodine Other (See Comments)    Sensitive when used gynecologically (intra-vaginally).  . Iron Itching    Iron taken by mouth causes this reaction. The patient has had several iron infusions plus Feraheme that has not caused any side effects.   . Lovaza [Omega-3-Acid Ethyl Esters] Itching  . Soma [Carisoprodol] Other (See Comments)    Pt states that this medication causes panic attacks.   . Sulfonamide Derivatives Other (See Comments)    Sensitive when used intra-vaginally.  . Benazepril Hcl Other (See Comments)    Reaction:  Severe cough   . Codeine Other (See Comments)    Pt states that this medication causes rapid heartbeat.   Marland Kitchen. Penicillins Other (See Comments)    Reaction:  Severe family allergy.  Family physician long ago stated she shouldn't take this med since mother and brother nearly died from anaphylaxis.  Marland Kitchen. Zolpidem Tartrate Itching and Other (See Comments)    Pt states that this medication causes blackouts.   . Amitriptyline Other (See Comments)    Pt states that this medication causes sleep walking.    Home medications Prior to Admission medications   Medication Sig Start Date End Date Taking? Authorizing Provider  acarbose (PRECOSE) 25 MG tablet Take 25 mg by mouth daily as needed (for low blood sugar).  10/09/12  Yes Historical Provider, MD  alprazolam Prudy Feeler(XANAX) 2 MG tablet Take 2-4 mg by mouth 3 (three) times daily as needed for sleep or anxiety (Pt takes 1 tablet twice a day during the day and 2 tablets at night.).    Yes Historical Provider, MD  Cholecalciferol (VITAMIN D-3)  5000 UNITS TABS Take 2 tablets by mouth daily.   Yes Historical Provider, MD  Cyanocobalamin (VITAMIN B-12 IJ) Inject as directed every 28 (twenty-eight) days.   Yes Historical Provider, MD  cyclobenzaprine (FLEXERIL) 10 MG tablet Take 10 mg by mouth 3 (three) times daily.    Yes Historical Provider, MD  diclofenac (VOLTAREN) 75 MG EC tablet Take 75 mg by mouth 2 (two) times daily.  07/16/12  Yes Historical Provider, MD  escitalopram (LEXAPRO) 20 MG tablet Take 1 tablet (20 mg total) by mouth daily. For depression and anxiety. 07/13/12  Yes Fransisca KaufmannLaura Davis, NP  ibuprofen (ADVIL,MOTRIN) 800 MG tablet Take 800 mg by mouth every 6 (six) hours as needed for pain.   Yes Historical Provider, MD  levothyroxine (SYNTHROID, LEVOTHROID) 112 MCG tablet Take 112-224 mcg by mouth daily before breakfast. Pt takes 224mcg every day, but on Monday she takes 112 mcg.   Yes Historical Provider, MD  loratadine (CLARITIN) 10 MG tablet Take 1 tablet (10 mg total) by mouth 2 (two) times daily. 07/13/12  Yes Fransisca KaufmannLaura Davis, NP  modafinil (PROVIGIL) 200 MG tablet Take 400 mg by mouth every morning.  11/19/12  Yes Historical Provider, MD  omeprazole (PRILOSEC) 40 MG capsule Take 1 capsule (40 mg total) by mouth daily. 07/13/12  Yes Fransisca KaufmannLaura Davis, NP  OxyCODONE HCl ER 30 MG T12A Take 30 mg by mouth 3 (three) times daily. 08/06/12  Yes Josph MachoPeter R Ennever, MD  oxyCODONE-acetaminophen (PERCOCET) 10-325 MG per tablet Take 1 tablet by mouth 4 (four) times daily. 08/06/12  Yes Josph MachoPeter R Ennever, MD  promethazine (PHENERGAN) 25 MG tablet Take 25 mg by mouth every 8 (eight) hours as needed for nausea.    Yes Historical Provider, MD  SUMAtriptan (IMITREX) 100 MG tablet Take 100 mg by mouth every 2 (two) hours as needed for migraine.  05/01/12  Yes Historical Provider, MD  topiramate (TOPAMAX) 200 MG tablet Take 1 tablet (200 mg total) by mouth at bedtime. 07/13/12  Yes Fransisca KaufmannLaura Davis, NP  valACYclovir (VALTREX) 1000 MG tablet Take 1,000 mg by mouth daily as needed  (for flare ups).  08/16/11  Yes Historical Provider, MD   Liver Function Tests No results found for this basename: AST, ALT, ALKPHOS, BILITOT, PROT, ALBUMIN,  in the last 168 hours No results found for this basename: LIPASE, AMYLASE,  in the last 168 hours CBC  Recent Labs Lab 10/31/13 1356 11/01/13 0330  WBC 5.0 3.6*  NEUTROABS 3.5  --   HGB 10.7* 9.6*  HCT 30.8* 28.0*  MCV 90.1 90.9  PLT 220 180   Basic Metabolic Panel  Recent Labs Lab 10/31/13 1356 10/31/13 2015 11/01/13 0330 11/01/13 0942 11/01/13 1323  NA 121* 124* 125* 125* 128*  K 3.3* 3.1* 3.4* 4.1 4.1  CL 88* 90* 94* 94* 98  CO2 21 22 20 21 21   GLUCOSE 86 79 79 80 82  BUN 19 18 16 13 13   CREATININE 0.92 0.86 0.89 0.78 0.88  CALCIUM 8.1* 8.1* 7.4* 7.8* 7.8*    Filed Vitals:   11/01/13 1300 11/01/13 1400 11/01/13 1500 11/01/13 1518  BP: 81/60 86/55 94/60    Pulse: 78 67 65   Temp:    97.6 F (36.4 C)  TempSrc:    Axillary  Resp: 18 13 17    Height:      Weight:      SpO2: 100% 95% 93%    Exam Alert, periorbital ecchymoses No rash, cyanosis or gangrene Sclera anicteric, throat clear No jvd Chest clear bilat RRR no MRG Abd soft, NTND, no ascites No LE edema or UE edema  UNa 59, UOsm 267 CXR - clear  Assessment: 1 Hyponatremia - possibilities include SSRI (Lexapro), SIADH, low solute intake.  Looks euvolemic.  Na better with IV NS 0.9%.   2 Fall w facial trauma 3 Anxiety / depression / bipolar disorder 4 Hypothyroidism   Rec - continue NaCl for now since it is helping. Fluid restrict, will start NaCl tabs.  Consider stopping Lexapro if patient can do without it.    Vinson Moselle MD (pgr) [phone]    ([phone] 11/01/2013, 3:57 PM

## 2013-11-01 NOTE — Progress Notes (Signed)
CARE MANAGEMENT NOTE 11/01/2013  Patient:  Valerie HongEMETH,Valerie J   Account Number:  [id number]  Date Initiated:  11/01/2013  Documentation initiated by:  Shyleigh Daughtry  Subjective/Objective Assessment:   pt with hx of siadh and bipolar anexity disorder admitted due to hyponatremia-119/iv ns bolus and infusion started hypotensive on admitted     Action/Plan:   home when stable   Anticipated DC Date:  11/04/2013   Anticipated DC Plan:  HOME/SELF CARE  In-house referral  NA      DC Planning Services  NA      [facility]  [facility]   Choice offered to / List presented to:  NA   DME arranged  NA      DME agency  NA     HH arranged  NA      HH agency  NA   Status of service:  In process, will continue to follow Medicare Important Message given?   (If response is "NO", the following Medicare IM given date fields will be blank) Date Medicare IM given:   Medicare IM given by:   Date Additional Medicare IM given:   Additional Medicare IM given by:    Discharge Disposition:    Per UR Regulation:  Reviewed for med. necessity/level of care/duration of stay  If discussed at Long Length of Stay Meetings, dates discussed:    Comments:  [id number]/Roniesha Hollingshead,RN,BSN,CCM:

## 2013-11-01 NOTE — Progress Notes (Signed)
PT Cancellation Note  Patient Details Name: Valerie Silva MRN: [name]011 DOB: Dec 19, 1960   Cancelled Treatment:    Reason Eval/Treat Not Completed: Patient declined, no reason specified Pt declined mobility today.  Will check back as schedule permits   Valerie Silva,Valerie Silva 11/01/2013, 4:05 PM Valerie JarredKati Teegan Silva, PT, DPT 11/01/2013 Pager: [phone]

## 2013-11-01 NOTE — Evaluation (Signed)
Occupational Therapy Evaluation Patient Details Name: Valerie Silva MRN: [id number] DOB: 1960/08/02 Today's Date: 11/01/2013    History of Present Illness Pt was admitted after a fall.  She has a h/o bipolar disorder, extreme anxiety, fibromyalgia and is s/p gastric bypass   Clinical Impression   This 53 year old female was admitted for the above.  She will benefit from skilled OT to increase safety and independence with adls.  Pt was mod I for basic ADLs prior to admission, and she now needs min to mod A for adls and SPTs.  Goals in acute are for supervision to min A.    Follow Up Recommendations  SNF    Equipment Recommendations  3 in 1 bedside comode    Recommendations for Other Services       Precautions / Restrictions Precautions Precautions: Fall Restrictions Weight Bearing Restrictions: No Other Position/Activity Restrictions: all xrays negative.  Pt is wearing a L KI which was given to her in the ED.        Mobility Bed Mobility Overal bed mobility: Needs Assistance Bed Mobility: Supine to Sit;Sit to Supine     Supine to sit: Min guard Sit to supine: Min assist   General bed mobility comments: assist for LLE getting back to bed  Transfers Overall transfer level: Needs assistance Equipment used: 1 person hand held assist Transfers: Sit to/from [employer]Stand;Stand Pivot Transfers Sit to Stand: Min assist Stand pivot transfers: Min assist       General transfer comment: assist to power up and steady; pt did not use bil UEs on RW    Balance                                            ADL Overall ADL's : Needs assistance/impaired     Grooming: Set up;Sitting   Upper Body Bathing: Set up;Sitting   Lower Body Bathing: Minimal assistance;Sit to/from stand   Upper Body Dressing : Minimal assistance;Sitting   Lower Body Dressing: Moderate assistance;Sit to/from stand   Toilet Transfer: Minimal assistance;BSC;Stand-pivot   Toileting-  Clothing Manipulation and Hygiene: Minimal assistance;Sit to/from stand         General ADL Comments: performed spt with hand held assist.  tried RW, building up R handle, but pt did not like it--did not bear weight on RUE.  Pt pulls legs onto bed to reach feet for ADLs, unable to do this with L.     Vision                     Perception     Praxis      Pertinent Vitals/Pain Pain Assessment: 0-10 Pain Score: 6  Pain Location: all over Pain Descriptors / Indicators: Aching Pain Intervention(s): Limited activity within patient's tolerance;Monitored during session     Hand Dominance     Extremity/Trunk Assessment Upper Extremity Assessment Upper Extremity Assessment: Generalized weakness;RUE deficits/detail;LUE deficits/detail RUE Deficits / Details: hurt wrist and hand in the fall LUE Deficits / Details: h/o hurting L shoulder:  ROM WFLs           Communication Communication Communication: No difficulties   Cognition Arousal/Alertness: Awake/alert Behavior During Therapy: WFL for tasks assessed/performed Overall Cognitive Status: Within Functional Limits for tasks assessed                     General  Comments       Exercises       Shoulder Instructions      Home Living Family/patient expects to be discharged to:: Unsure                                 Additional Comments: pt lives alone; son next door but pt states he can not be relied upon to help her      Prior Functioning/Environment Level of Independence: Needs assistance        Comments: did the best she could; had difficulty with IADLs    OT Diagnosis: Generalized weakness;Acute pain   OT Problem List: Decreased strength;Decreased activity tolerance;Impaired balance (sitting and/or standing);Decreased knowledge of use of DME or AE;Pain;Impaired UE functional use   OT Treatment/Interventions: Self-care/ADL training;DME and/or AE instruction;Patient/family  education;Balance training;Therapeutic activities    OT Goals(Current goals can be found in the care plan section) Acute Rehab OT Goals Patient Stated Goal: less pain OT Goal Formulation: With patient Time For Goal Achievement: 11/15/13 Potential to Achieve Goals: Good ADL Goals Pt Will Perform Lower Body Bathing: with supervision;with adaptive equipment;sit to/from stand Pt Will Perform Lower Body Dressing: with min assist;with adaptive equipment;sit to/from stand Pt Will Transfer to Toilet: with supervision;bedside commode;stand pivot transfer Pt Will Perform Toileting - Clothing Manipulation and hygiene: with supervision;sit to/from stand  OT Frequency: Min 2X/week   Barriers to D/C: Decreased caregiver support          Co-evaluation              End of Session    Activity Tolerance: Patient tolerated treatment well Patient left: in bed;with call bell/phone within reach;with nursing/sitter in room (lab)   Time: [id number] OT Time Calculation (min): 38 min Charges:  OT General Charges $OT Visit: 1 Procedure OT Evaluation $Initial OT Evaluation Tier I: 1 Procedure OT Treatments $Self Care/Home Management : 23-37 mins G-Codes:    Valerie Silva 11/01/2013, 2:42 PM  Valerie Silva, OTR/L [phone] 11/01/2013

## 2013-11-02 DIAGNOSIS — I959 Hypotension, unspecified: Secondary | ICD-10-CM

## 2013-11-02 LAB — BASIC METABOLIC PANEL
ANION GAP: 10 (ref 5–15)
BUN: 11 mg/dL (ref 6–23)
CO2: 19 mEq/L (ref 19–32)
Calcium: 7.7 mg/dL — ABNORMAL LOW (ref 8.4–10.5)
Chloride: 103 mEq/L (ref 96–112)
Creatinine, Ser: 0.85 mg/dL (ref 0.50–1.10)
GFR calc Af Amer: 89 mL/min — ABNORMAL LOW (ref >90)
GFR calc non Af Amer: 77 mL/min — ABNORMAL LOW (ref >90)
Glucose, Bld: 84 mg/dL (ref 70–99)
POTASSIUM: 4 meq/L (ref 3.7–5.3)
SODIUM: 132 meq/L — AB (ref 137–147)

## 2013-11-02 LAB — ACTH STIMULATION, 3 TIME POINTS
CORTISOL 60 MIN: 31.2 ug/dL (ref >20)
Cortisol, 30 Min: 26.4 ug/dL (ref >20.0)
Cortisol, Base: 12 ug/dL

## 2013-11-02 LAB — CBC
HCT: 29.1 % — ABNORMAL LOW (ref 36.0–46.0)
HEMOGLOBIN: 9.8 g/dL — AB (ref 12.0–15.0)
MCH: 31.6 pg (ref 26.0–34.0)
MCHC: 33.7 g/dL (ref 30.0–36.0)
MCV: 93.9 fL (ref 78.0–100.0)
Platelets: 176 10*3/uL (ref 150–400)
RBC: 3.1 MIL/uL — ABNORMAL LOW (ref 3.87–5.11)
RDW: 13.6 % (ref 11.5–15.5)
WBC: 3.3 10*3/uL — ABNORMAL LOW (ref 4.0–10.5)

## 2013-11-02 MED ORDER — OXYCODONE-ACETAMINOPHEN 5-325 MG PO TABS
1.0000 | ORAL_TABLET | ORAL | Status: DC | PRN
  Administered 2013-11-02 – 2013-11-04 (×2): 1 via ORAL
  Filled 2013-11-02 (×2): qty 1

## 2013-11-02 MED ORDER — OXYCODONE HCL 5 MG PO TABS
5.0000 mg | ORAL_TABLET | ORAL | Status: DC | PRN
  Administered 2013-11-02 – 2013-11-04 (×4): 5 mg via ORAL
  Filled 2013-11-02 (×4): qty 1

## 2013-11-02 NOTE — ED Provider Notes (Signed)
CSN: [id number]     Arrival date & time 10/31/13  1036 History   First MD Initiated Contact with Patient 10/31/13 1037     Chief Complaint  Patient presents with  . Fall     (Consider location/radiation/quality/duration/timing/severity/associated sxs/prior Treatment) HPI Comments: 53 y.o. female, with history of bipolar disorder, extreme anxiety, gastric bypass surgery, type 2 diabetes mellitus, hypothyroidism, comes in with cc of fall. Pt had a mechanical fall after her dog pulled her, and pt fell face first down 2 stairs onto concrete. Pain to the face and head, and also hip. There is also recent admission for hyponatremia. PCP requesting repeat lytes.   Patient is a 53 y.o. female presenting with fall. The history is provided by the patient.  Fall Pertinent negatives include no chest pain, no abdominal pain, no headaches and no shortness of breath.    Past Medical History  Diagnosis Date  . Osteoporosis   . Hypothyroidism   . Diabetes mellitus   . Exogenous obesity   . Rhinitis   . Arthritis   . Fibromyalgia   . Somnolence   . Sleep apnea   . Anemia, iron deficiency 04/06/2011  . Pernicious anemia 04/06/2011  . Depression   . Anxiety   . Iron deficiency anemia, unspecified 12/12/2012   Past Surgical History  Procedure Laterality Date  . Gastric bypass    . Cholecystectomy    . Tracheostomy    . Uvulectomy    . Tonsillectomy    . Mandible reconstruction    . Spinal fusion    . Dilation and curettage of uterus    . Cesarean section     Family History  Problem Relation Age of Onset  . CAD Father   . Diabetes Father   . CAD Brother      X3  . Hypertension Brother   . Colon cancer Brother    History  Substance Use Topics  . Smoking status: Never Smoker   . Smokeless tobacco: Never Used     Comment: never used tobacco  . Alcohol Use: Yes     Comment: one mixed drink twice yearly   OB History   Grav Para Term Preterm Abortions TAB SAB Ect Mult Living            Review of Systems  Constitutional: Positive for fatigue.  Respiratory: Negative for shortness of breath.   Cardiovascular: Negative for chest pain.  Gastrointestinal: Negative for nausea, vomiting and abdominal pain.  Genitourinary: Negative for dysuria.  Musculoskeletal: Negative for neck pain.  Skin: Positive for wound.  Allergic/Immunologic: Negative for immunocompromised state.  Neurological: Positive for weakness. Negative for seizures and headaches.  Hematological: Does not bruise/bleed easily.  Psychiatric/Behavioral: Negative for confusion.      Allergies  Iodine; Iron; Lovaza; Soma; Sulfonamide derivatives; Benazepril hcl; Codeine; Penicillins; Zolpidem tartrate; and Amitriptyline  Home Medications   Prior to Admission medications   Medication Sig Start Date End Date Taking? Authorizing Provider  acarbose (PRECOSE) 25 MG tablet Take 25 mg by mouth daily as needed (for low blood sugar).  10/09/12  Yes Historical Provider, MD  alprazolam Prudy Feeler(XANAX) 2 MG tablet Take 2-4 mg by mouth 3 (three) times daily as needed for sleep or anxiety (Pt takes 1 tablet twice a day during the day and 2 tablets at night.).    Yes Historical Provider, MD  Cholecalciferol (VITAMIN D-3) 5000 UNITS TABS Take 2 tablets by mouth daily.   Yes Historical Provider, MD  Cyanocobalamin (VITAMIN B-12  IJ) Inject as directed every 28 (twenty-eight) days.   Yes Historical Provider, MD  cyclobenzaprine (FLEXERIL) 10 MG tablet Take 10 mg by mouth 3 (three) times daily.    Yes Historical Provider, MD  diclofenac (VOLTAREN) 75 MG EC tablet Take 75 mg by mouth 2 (two) times daily.  07/16/12  Yes Historical Provider, MD  escitalopram (LEXAPRO) 20 MG tablet Take 1 tablet (20 mg total) by mouth daily. For depression and anxiety. 07/13/12  Yes Fransisca Kaufmann, NP  ibuprofen (ADVIL,MOTRIN) 800 MG tablet Take 800 mg by mouth every 6 (six) hours as needed for pain.   Yes Historical Provider, MD  levothyroxine (SYNTHROID,  LEVOTHROID) 112 MCG tablet Take 112-224 mcg by mouth daily before breakfast. Pt takes every day, but on Monday she takes 112 mcg.   Yes Historical Provider, MD  loratadine (CLARITIN) 10 MG tablet Take 1 tablet (10 mg total) by mouth 2 (two) times daily. 07/13/12  Yes Fransisca Kaufmann, NP  modafinil (PROVIGIL) 200 MG tablet Take 400 mg by mouth every morning.  11/19/12  Yes Historical Provider, MD  omeprazole (PRILOSEC) 40 MG capsule Take 1 capsule (40 mg total) by mouth daily. 07/13/12  Yes Fransisca Kaufmann, NP  OxyCODONE HCl ER 30 MG T12A Take 30 mg by mouth 3 (three) times daily. 08/06/12  Yes Josph Macho, MD  oxyCODONE-acetaminophen (PERCOCET) 10-325 MG per tablet Take 1 tablet by mouth 4 (four) times daily. 08/06/12  Yes Josph Macho, MD  promethazine (PHENERGAN) 25 MG tablet Take 25 mg by mouth every 8 (eight) hours as needed for nausea.    Yes Historical Provider, MD  SUMAtriptan (IMITREX) 100 MG tablet Take 100 mg by mouth every 2 (two) hours as needed for migraine.  05/01/12  Yes Historical Provider, MD  topiramate (TOPAMAX) 200 MG tablet Take 1 tablet (200 mg total) by mouth at bedtime. 07/13/12  Yes Fransisca Kaufmann, NP  valACYclovir (VALTREX) 1000 MG tablet Take 1,000 mg by mouth daily as needed (for flare ups).  08/16/11  Yes Historical Provider, MD   BP 91/49  Pulse 81  Temp(Src) 97.9 F (36.6 C) (Axillary)  Resp 12  Ht 5\' 9"  (1.753 m)  Wt 226 lb 13.7 oz (102.9 kg)  BMI 33.49 kg/m2  SpO2 93% Physical Exam  Nursing note and vitals reviewed. Constitutional: She is oriented to person, place, and time. She appears well-developed and well-nourished.  HENT:  Head: Normocephalic and atraumatic.  Eyes: EOM are normal. Pupils are equal, round, and reactive to light.  Neck: Neck supple.  Cardiovascular: Normal rate, regular rhythm and normal heart sounds.   No murmur heard. Pulmonary/Chest: Effort normal. No respiratory distress.  Abdominal: Soft. She exhibits no distension. There is no  tenderness. There is no rebound and no guarding.  Musculoskeletal:  Facial hematoma, forehead, and around the lips.  Neurological: She is alert and oriented to person, place, and time.  Skin: Skin is warm and dry.    ED Course  Procedures (including critical care time) Labs Review Labs Reviewed  CBC WITH DIFFERENTIAL - Abnormal; Notable for the following:    RBC 3.42 (*)    Hemoglobin 10.7 (*)    HCT 30.8 (*)    All other components within normal limits  BASIC METABOLIC PANEL - Abnormal; Notable for the following:    Sodium 121 (*)    Potassium 3.3 (*)    Chloride 88 (*)    Calcium 8.1 (*)    GFR calc non Af Amer 70 (*)  GFR calc Af Amer 81 (*)    All other components within normal limits  OSMOLALITY, URINE - Abnormal; Notable for the following:    Osmolality, Ur 267 (*)    All other components within normal limits  OSMOLALITY - Abnormal; Notable for the following:    Osmolality 255 (*)    All other components within normal limits  BASIC METABOLIC PANEL - Abnormal; Notable for the following:    Sodium 125 (*)    Potassium 3.4 (*)    Chloride 94 (*)    Calcium 7.4 (*)    GFR calc non Af Amer 73 (*)    GFR calc Af Amer 84 (*)    All other components within normal limits  CBC - Abnormal; Notable for the following:    WBC 3.6 (*)    RBC 3.08 (*)    Hemoglobin 9.6 (*)    HCT 28.0 (*)    All other components within normal limits  BASIC METABOLIC PANEL - Abnormal; Notable for the following:    Sodium 124 (*)    Potassium 3.1 (*)    Chloride 90 (*)    Calcium 8.1 (*)    GFR calc non Af Amer 76 (*)    GFR calc Af Amer 88 (*)    All other components within normal limits  BASIC METABOLIC PANEL - Abnormal; Notable for the following:    Sodium 125 (*)    Chloride 94 (*)    Calcium 7.8 (*)    All other components within normal limits  BASIC METABOLIC PANEL - Abnormal; Notable for the following:    Sodium 128 (*)    Calcium 7.8 (*)    GFR calc non Af Amer 74 (*)    GFR  calc Af Amer 85 (*)    All other components within normal limits  URINE CULTURE  MRSA PCR SCREENING  URINALYSIS, ROUTINE W REFLEX MICROSCOPIC  SODIUM, URINE, RANDOM  CREATININE, URINE, RANDOM  MAGNESIUM  TSH  CORTISOL  BASIC METABOLIC PANEL  ACTH STIMULATION, 3 TIME POINTS  CBC    Imaging Review X-ray Chest Pa And Lateral  10/31/2013   CLINICAL DATA:  53 year old female status post fall today outside, Face hematoma. Discovered to have critical abnormal sodium levels. Initial encounter.  EXAM: CHEST  2 VIEW  COMPARISON:  Chest radiographs 10/11/2013 and earlier.  FINDINGS: Upright AP and lateral views of the chest. Stable and normal lung volumes. Normal cardiac size and mediastinal contours. Visualized tracheal air column is within normal limits. No pneumothorax, pulmonary edema, pleural effusion or confluent pulmonary opacity. Cervical ACDF hardware partially visible. Augmented upper lumbar compression fractures partially visible. No acute osseous abnormality identified. Stable ventral abdominal surgical clips.  IMPRESSION: No acute cardiopulmonary abnormality or acute traumatic injury identified.   Electronically Signed   By: Augusto GambleLee  Hall M.D.   On: 10/31/2013 21:49   Dg Wrist Complete Right  10/31/2013   CLINICAL DATA:  Generalized wrist pain after falling today. Initial encounter.  EXAM: RIGHT WRIST - COMPLETE 3+ VIEW  COMPARISON:  None.  FINDINGS: The bones appear mildly demineralized. There is no evidence of acute fracture or dislocation. The joint spaces are maintained. There is no evidence of focal soft tissue swelling.  IMPRESSION: No acute osseous findings.   Electronically Signed   By: Roxy HorsemanBill  Veazey M.D.   On: 10/31/2013 20:03   Dg Femur Left  10/31/2013   CLINICAL DATA:  Pt's dog pulled her down while she was holding leash today.Pt  fell on concrete.Left mid femur pain. Left mid tib/fib pain. Some bruising at mid tib/fib.  EXAM: LEFT FEMUR - 2 VIEW  COMPARISON:  None.  FINDINGS: There  is no evidence of fracture or other focal bone lesions. Soft tissues are unremarkable.  IMPRESSION: No acute osseous injury of the left femur.   Electronically Signed   By: Elige Ko   On: 10/31/2013 12:40   Dg Tibia/fibula Left  10/31/2013   CLINICAL DATA:  Fall.  EXAM: LEFT TIBIA AND FIBULA - 2 VIEW  COMPARISON:  None.  FINDINGS: There is no evidence of fracture or other focal bone lesions. Soft tissues are unremarkable.  IMPRESSION: Normal left tibia and fibula.   Electronically Signed   By: Roque Lias M.D.   On: 10/31/2013 12:41   Ct Head Wo Contrast  10/31/2013   CLINICAL DATA:  Status post fall today. Hematoma right forehead. Initial encounter.  EXAM: CT HEAD WITHOUT CONTRAST  TECHNIQUE: Contiguous axial images were obtained from the base of the skull through the vertex without intravenous contrast.  COMPARISON:  Head CT scan 10/11/2013 and 05/09/2013 be  FINDINGS: Hematoma is seen over the right frontal bone. There is no underlying fracture. The brain appears normal without hemorrhage, infarct, mass lesion, mass effect, midline shift or abnormal extra-axial fluid collection. No hydrocephalus or pneumocephalus. Imaged paranasal sinuses and mastoid air cells are clear.  IMPRESSION: Hematoma over the right frontal bone without underlying fracture or intracranial abnormality.   Electronically Signed   By: Drusilla Kanner M.D.   On: 10/31/2013 12:21     EKG Interpretation None      MDM   Final diagnoses:  Hyponatremia  Fall     Pt with mechanical fall. Appropriate imaging ordered - and normal, However, pt has low sodium on screening, na is 121. Asymptomatic currently. Will admit  Derwood Kaplan, MD 11/02/13 1610

## 2013-11-02 NOTE — Progress Notes (Signed)
[facility] PROGRESS NOTE  Valerie Silva MWU:[id number] DOB: 1960-05-21 DOA: 10/31/2013 PCP: [name],Valerie Silva  Assessment/Plan  Severe hypoosmolar hyponatremia, may be combination of dehydration, lexapro use, cortisol deficiency, resolving with IVF and salt tabs -  Appreciate nephrology assistance -  Continue salt tabs -  Cosyntropin stim test results pending -  TSH 3.64 -  Consider tapering lexapro -  CXR unremarkable  Hypotension overnight without evidence of sepsis, hemorrhage.  Suspect she may have some adrenal insufficiency -  stim test as above -  hgb stable, no signs of underlying infection  Chronic pain -  Continue home oxycontin and increase percocet to q4h prn instead of four times daily scheduled -  Continue diclofenac -  Defer further pain management to PCP  Hypothyroidism, TSH 3.64, continue synthroid.   Bipolar disorder/depression/anxiety, stable. Continue home regimen of Lexapro, Xanax, Provigil.  -  Recommend tapering lexapro if possible  Iron deficiency anemia and B12 deficiency, secondary to gastric bypass. Patient is being followed by hematology as outpatient.  -  Continue iron/B12 supplements  Migraine headaches, stable. Continue home regimen of Topamax. Imitrex as needed.   Hypokalemia, magnesium level wnl, trending up with repletion.     Fall -   PT/OT.  OT recommending SNF, PT consult pending -  SW consult for placement  Diet:  regular Access:  PIV IVF:  off Proph:  lovenox  Code Status: DNR Family Communication: patient alone Disposition Plan:  Awaiting stim test results.  To SNF when bed available   Consultants:  none  Procedures:  CT head  XR tibia/fibula/femur left  XR right wrist  CXR  Antibiotics:  none   HPI/Subjective:  States she is in severe pain.  Denies difficult with stools or passing urine.  Eating well.    Objective: Filed Vitals:   11/02/13 0400 11/02/13 0600 11/02/13 0629 11/02/13 0700  BP:   88/46 107/53 86/49  Pulse:  73 73 71  Temp: 98.4 F (36.9 C)     TempSrc: Axillary     Resp:  15 16 14   Height:      Weight:      SpO2:  95% 97% 97%    Intake/Output Summary (Last 24 hours) at 11/02/13 0804 Last data filed at 11/02/13 0700  Gross per 24 hour  Intake   3835 ml  Output   2450 ml  Net   1385 ml   Filed Weights   10/31/13 1800 11/01/13 0600  Weight: 97.4 kg (214 lb 11.7 oz) 102.9 kg (226 lb 13.7 oz)    Exam:   General:  WF, No acute distress  HEENT:  Obvious swelling and bruising mostly in right face above right eye, racoon eyes, MMM, trach scar  Cardiovascular:  RRR, nl S1, S2 no mrg, 2+ pulses, warm extremities  Respiratory:  CTAB, no increased WOB  Abdomen:   NABS, soft, NT/ND  MSK:   Normal tone and bulk, no LEE, left leg in splint  Neuro:  Grossly intact  Data Reviewed: Basic Metabolic Panel:  Recent Labs Lab 10/31/13 1352  10/31/13 2015 11/01/13 0330 11/01/13 0942 11/01/13 1323 11/02/13 0555  NA  --   < > 124* 125* 125* 128* 132*  K  --   < > 3.1* 3.4* 4.1 4.1 4.0  CL  --   < > 90* 94* 94* 98 103  CO2  --   < > 22 20 21 21 19   GLUCOSE  --   < > 79 79 80  82 84  BUN  --   < > 18 16 13 13 11   CREATININE  --   < > 0.86 0.89 0.78 0.88 0.85  CALCIUM  --   < > 8.1* 7.4* 7.8* 7.8* 7.7*  MG 1.8  --   --   --   --   --   --   < > = values in this interval not displayed. Liver Function Tests: No results found for this basename: AST, ALT, ALKPHOS, BILITOT, PROT, ALBUMIN,  in the last 168 hours No results found for this basename: LIPASE, AMYLASE,  in the last 168 hours No results found for this basename: AMMONIA,  in the last 168 hours CBC:  Recent Labs Lab 10/31/13 1356 11/01/13 0330 11/02/13 0555  WBC 5.0 3.6* 3.3*  NEUTROABS 3.5  --   --   HGB 10.7* 9.6* 9.8*  HCT 30.8* 28.0* 29.1*  MCV 90.1 90.9 93.9  PLT 220 180 176   Cardiac Enzymes: No results found for this basename: CKTOTAL, CKMB, CKMBINDEX, TROPONINI,  in the last 168  hours BNP (last 3 results) No results found for this basename: PROBNP,  in the last 8760 hours CBG: No results found for this basename: GLUCAP,  in the last 168 hours  Recent Results (from the past 240 hour(s))  URINE CULTURE     Status: None   Collection Time    10/31/13  3:35 PM      Result Value Ref Range Status   Specimen Description URINE, CATHETERIZED   Final   Special Requests NONE   Final   Culture  Setup Time     Final   Value: 10/31/2013 21:09     Performed at [facility]   Colony Count     Final   Value: NO GROWTH     [facility]formed at [facility]   Culture     Final   Value: NO GROWTH     Performed at [facility]   Report Status 11/01/2013 FINAL   Final  MRSA PCR SCREENING     Status: None   Collection Time    10/31/13  6:30 PM      Result Value Ref Range Status   MRSA by PCR NEGATIVE  NEGATIVE Final   Comment:            The GeneXpert MRSA Assay (FDA     approved for NASAL specimens     only), is one component of a     comprehensive MRSA colonization     surveillance program. It is not     intended to diagnose MRSA     infection nor to guide or     monitor treatment for     MRSA infections.     [facility]     Studies: X-ray Chest Pa And Lateral  10/31/2013   CLINICAL DATA:  53 year old female status post fall today outside, Face hematoma. Discovered to have critical abnormal sodium levels. Initial encounter.  EXAM: CHEST  2 VIEW  COMPARISON:  Chest radiographs 10/11/2013 and earlier.  FINDINGS: Upright AP and lateral views of the chest. Stable and normal lung volumes. Normal cardiac size and mediastinal contours. Visualized tracheal air column is within normal limits. No pneumothorax, pulmonary edema, pleural effusion or confluent pulmonary opacity. Cervical ACDF hardware partially visible. Augmented upper lumbar compression fractures partially visible. No acute osseous abnormality identified. Stable ventral  abdominal surgical clips.  IMPRESSION: No acute cardiopulmonary abnormality  or acute traumatic injury identified.   Electronically Signed   By: Valerie Silva M.D.   On: 10/31/2013 21:49   Dg Wrist Complete Right  10/31/2013   CLINICAL DATA:  Generalized wrist pain after falling today. Initial encounter.  EXAM: RIGHT WRIST - COMPLETE 3+ VIEW  COMPARISON:  None.  FINDINGS: The bones appear mildly demineralized. There is no evidence of acute fracture or dislocation. The joint spaces are maintained. There is no evidence of focal soft tissue swelling.  IMPRESSION: No acute osseous findings.   Electronically Signed   By: Roxy Horseman M.D.   On: 10/31/2013 20:03   Dg Femur Left  10/31/2013   CLINICAL DATA:  Pt's dog pulled her down while she was holding leash today.Pt fell on concrete.Left mid femur pain. Left mid tib/fib pain. Some bruising at mid tib/fib.  EXAM: LEFT FEMUR - 2 VIEW  COMPARISON:  None.  FINDINGS: There is no evidence of fracture or other focal bone lesions. Soft tissues are unremarkable.  IMPRESSION: No acute osseous injury of the left femur.   Electronically Signed   By: Elige Ko   On: 10/31/2013 12:40   Dg Tibia/fibula Left  10/31/2013   CLINICAL DATA:  Fall.  EXAM: LEFT TIBIA AND FIBULA - 2 VIEW  COMPARISON:  None.  FINDINGS: There is no evidence of fracture or other focal bone lesions. Soft tissues are unremarkable.  IMPRESSION: Normal left tibia and fibula.   Electronically Signed   By: Roque Lias M.D.   On: 10/31/2013 12:41   Ct Head Wo Contrast  10/31/2013   CLINICAL DATA:  Status post fall today. Hematoma right forehead. Initial encounter.  EXAM: CT HEAD WITHOUT CONTRAST  TECHNIQUE: Contiguous axial images were obtained from the base of the skull through the vertex without intravenous contrast.  COMPARISON:  Head CT scan 10/11/2013 and 05/09/2013 be  FINDINGS: Hematoma is seen over the right frontal bone. There is no underlying fracture. The brain appears normal without  hemorrhage, infarct, mass lesion, mass effect, midline shift or abnormal extra-axial fluid collection. No hydrocephalus or pneumocephalus. Imaged paranasal sinuses and mastoid air cells are clear.  IMPRESSION: Hematoma over the right frontal bone without underlying fracture or intracranial abnormality.   Electronically Signed   By: Drusilla Kanner M.D.   On: 10/31/2013 12:21    Scheduled Meds: . cholecalciferol  2,000 Units Oral Daily  . cyclobenzaprine  10 mg Oral TID  . diclofenac  75 mg Oral BID  . docusate sodium  100 mg Oral BID  . enoxaparin (LOVENOX) injection  40 mg Subcutaneous Q24H  . escitalopram  20 mg Oral Daily  . [START ON 11/04/2013] levothyroxine  112 mcg Oral Once per day on Mon  . levothyroxine  224 mcg Oral Once per day on Sun Tue Wed Thu Fri Sat  . loratadine  10 mg Oral BID  . modafinil  400 mg Oral q morning - 10a  . OxyCODONE  30 mg Oral 3 times per day  . pantoprazole  40 mg Oral Q0600  . sodium chloride  3 mL Intravenous Q12H  . sodium chloride  2 g Oral BID WC  . topiramate  200 mg Oral QHS   Continuous Infusions:    Principal Problem:   Hyponatremia Active Problems:   Hypothyroidism   Vitamin B 12 deficiency   Depression with anxiety   Dehydration   Hypokalemia   Iron deficiency anemia   SIADH (syndrome of inappropriate ADH production)    Time  spent: 30 min    Kayson Bullis, [facility]  [facility] Pager [phone]. If 7PM-7AM, please contact night-coverage at [email or web address], password [facility] 11/02/2013, 8:04 AM  LOS: 2 days

## 2013-11-02 NOTE — Progress Notes (Addendum)
Clinical Social Work Department CLINICAL SOCIAL WORK PLACEMENT NOTE 11/02/2013  Patient:  Valerie Silva,Valerie Silva  Account Number:  [id number] Admit date:  10/31/2013  Clinical Social Worker:  Jacelyn GripSUZANNA BYRD, LCSWA  Date/time:  11/02/2013 04:11 PM  Clinical Social Work is seeking post-discharge placement for this patient at the following level of care:   SKILLED NURSING   (*CSW will update this form in Epic as items are completed)   11/02/2013  Patient/family provided with [facility] Department of Clinical Social Work's list of facilities offering this level of care within the geographic area requested by the patient (or if unable, by the patient's family).  11/02/2013  Patient/family informed of their freedom to choose among providers that offer the needed level of care, that participate in Medicare, Medicaid or managed care program needed by the patient, have an available bed and are willing to accept the patient.  11/02/2013  Patient/family informed of [facility]' ownership interest in [facility], as well as of the fact that they are under no obligation to receive care at this facility.  PASARR submitted to EDS on 11/02/2013 PASARR number received on   FL2 transmitted to all facilities in geographic area requested by pt/family on  11/02/2013 FL2 transmitted to all facilities within larger geographic area on   Patient informed that his/her managed care company has contracts with or will negotiate with  certain facilities, including the following:     Patient/family informed of bed offers received:  11/04/2013 [facility] Physician recommends and patient chooses bed at    Patient [facility] be [facility]  [facility] on 11/04/2013 Patient to be transferred to facility by ambulance Sharin Mons([facility]) Patient and family notified of transfer on 11/04/2013 Name of family member notified:  Pt notified at bedside  The following physician  request were entered in Epic:   Additional Comments:   Loletta SpecterSuzanna Kidd, MSW, LCSW Clinical Social Work Weekend coverage [phone]

## 2013-11-02 NOTE — Progress Notes (Signed)
  [facility] Progress Note   Subjective: Na up 132 today. BP's finally up over 110  Filed Vitals:   11/02/13 1000 11/02/13 1100 11/02/13 1205 11/02/13 1540  BP: 94/51 105/55 112/69 117/74  Pulse: 78 102 96   Temp:   98.1 F (36.7 C)   TempSrc:   Oral   Resp: 11 14 18    Height:   5\' 9"  (1.753 m)   Weight:   140 kg (308 lb 10.3 oz)   SpO2: 99% 96% 100%    Exam: Alert, periorbital ecchymoses  No jvd  Chest clear bilat  RRR no MRG  Abd soft, NTND, no ascites  No LE edema or UE edema   UNa 59, UOsm 267  CXR - clear   Assessment:  1 Hyponatremia - possibilities include SSRI (Lexapro), SIADH, hypovolemia and low solute intake. Correcting with NaCl which suggests vol depletion component.   2 Fall w facial trauma  3 Anxiety / depression / bipolar disorder  4 Hypothyroidism   Plan- fluids stopped this am, agree. Is still getting NaCl tablets 2 gm bid.  If serum Na goes over 135 would d/c salt tabs. Will sign off.     Vinson Moselleob Marquon Alcala MD  pager [phone]    cell [phone]  11/02/2013, 6:09 PM     Recent Labs Lab 11/01/13 0942 11/01/13 1323 11/02/13 0555  NA 125* 128* 132*  K 4.1 4.1 4.0  CL 94* 98 103  CO2 21 21 19   GLUCOSE 80 82 84  BUN 13 13 11   CREATININE 0.78 0.88 0.85  CALCIUM 7.8* 7.8* 7.7*   No results found for this basename: AST, ALT, ALKPHOS, BILITOT, PROT, ALBUMIN,  in the last 168 hours  Recent Labs Lab 10/31/13 1356 11/01/13 0330 11/02/13 0555  WBC 5.0 3.6* 3.3*  NEUTROABS 3.5  --   --   HGB 10.7* 9.6* 9.8*  HCT 30.8* 28.0* 29.1*  MCV 90.1 90.9 93.9  PLT 220 180 176   . cholecalciferol  2,000 Units Oral Daily  . cyclobenzaprine  10 mg Oral TID  . diclofenac  75 mg Oral BID  . docusate sodium  100 mg Oral BID  . enoxaparin (LOVENOX) injection  40 mg Subcutaneous Q24H  . escitalopram  20 mg Oral Daily  . [START ON 11/04/2013] levothyroxine  112 mcg Oral Once per day on Mon  . levothyroxine  224 mcg Oral Once per day on Sun  Tue Wed Thu Fri Sat  . loratadine  10 mg Oral BID  . modafinil  400 mg Oral q morning - 10a  . OxyCODONE  30 mg Oral 3 times per day  . pantoprazole  40 mg Oral Q0600  . sodium chloride  3 mL Intravenous Q12H  . sodium chloride  2 g Oral BID WC  . topiramate  200 mg Oral QHS     acetaminophen, acetaminophen, albuterol, alprazolam, ALPRAZolam, alum & mag hydroxide-simeth, ibuprofen, ipratropium, ondansetron (ZOFRAN) IV, ondansetron, oxyCODONE, oxyCODONE-acetaminophen, polyethylene glycol, sorbitol, SUMAtriptan, valACYclovir

## 2013-11-02 NOTE — Progress Notes (Signed)
Clinical Social Work Department BRIEF PSYCHOSOCIAL ASSESSMENT 11/02/2013  Patient:  Valerie Silva, Valerie Silva     Account Number:  [id number]     Admit date:  10/31/2013  Clinical Social Worker:  Ulyess Blossom  Date/Time:  11/02/2013 04:06 PM  Referred by:  Physician  Date Referred:  11/02/2013 Referred for  SNF Placement   Other Referral:   Interview type:  Patient Other interview type:    PSYCHOSOCIAL DATA Living Status:  ALONE Admitted from facility:   Level of care:   Primary support name:  Marylyn Ishihara Cooler/son/[phone] Primary support relationship to patient:  CHILD, ADULT Degree of support available:   adequate    CURRENT CONCERNS Current Concerns  Post-Acute Placement   Other Concerns:    SOCIAL WORK ASSESSMENT / PLAN CSW received referral New SNF.    CSW met with pt at bedside. CSW introduced self and explained role. Pt discussed that she lives in an apartment alone. Pt shared that pt son lives in the apartment behind her and provides her assistance. CSW discussed recommendation for short term rehab at [facility]. Pt agreeable to initiation of SNF search, but expressed concern about how insurance would cover. CSW discussed insurance coverage and that once CSW initiates search then the facilities will be able to notify of exactly how insurance covers. Pt expressed understanding.    CSW completed FL2 and initiated SNF search to [facility].    CSW to follow up with pt re: SNF bed offers.    CSW to continue to follow to provide support and assist with pt discharge planning needs.   Assessment/plan status:  Psychosocial Support/Ongoing Assessment of Needs Other assessment/ plan:   discharge planning   Information/referral to community resources:   [facility] list    PATIENT'S/FAMILY'S RESPONSE TO PLAN OF CARE: Pt alert and oriented x 4. Pt agreeable to exploring SNF options, but wants to get further clarification about insurance coverage before deciding on  disposition plan. Pt concerned about cost related to ST SNF stay. Support provided.    Alison Murray, MSW, LCSW Clinical Social Work [employer] [phone]

## 2013-11-02 NOTE — Evaluation (Signed)
Physical Therapy Evaluation Patient Details Name: Valerie HongLaura J Ehlert MRN: [id number] DOB: 12-07-1960 Today's Date: 11/02/2013   History of Present Illness  Pt was admitted after a fall.  She has a h/o bipolar disorder, extreme anxiety, fibromyalgia and is s/p gastric bypass; left knee, facial and right wrist pain/injuries from fall.  Clinical Impression  Patient presents with decreased independence with mobility due to deficits listed in PT problem list.  She will benefit from skilled PT in the acute setting to allow return home with intermittent help from son following SNF level rehab stay. If she cannot afford due to ?$100/day copay then will need aide, HHPT, RW w/right platform for d/c.    Follow Up Recommendations SNF    Equipment Recommendations  Rolling walker with 5" wheels;Other (comment) (platform)    Recommendations for Other Services       Precautions / Restrictions Precautions Precautions: Fall Required Braces or Orthoses: Knee Immobilizer - Left Knee Immobilizer - Left: Other (comment) (for comfort)      Mobility  Bed Mobility Overal bed mobility: Modified Independent                Transfers Overall transfer level: Needs assistance   Transfers: Sit to/from Stand Sit to Stand: Min guard Stand pivot transfers: Min assist       General transfer comment: [facility], pt pulls with right hand on my arm, [facility] used arms on chair and reched for walker  Ambulation/Gait Ambulation/Gait assistance: Min guard Ambulation Distance (Feet): 100 Feet Assistive device: Right platform walker Gait Pattern/deviations: Step-to pattern;Trunk flexed     General Gait Details: initially flexed posture, then improved with adjusting height of walker  Stairs            Wheelchair Mobility    Modified Rankin (Stroke Patients Only)       Balance Overall balance assessment: History of Falls;Needs assistance         Standing balance  support: Single extremity supported;Bilateral upper extremity supported Standing balance-Leahy Scale: Poor Standing balance comment: reaching for support when walker not close                             Pertinent Vitals/Pain Pain Score: 9  Pain Location: head, knee, back after ambulation Pain Intervention(s): Repositioned;Monitored during session    Home Living Family/patient expects to be discharged to:: Private residence Living Arrangements: Alone Available Help at Discharge: Family;Available PRN/intermittently Type of Home: House Home Access: Stairs to enter Entrance Stairs-Rails: Right Entrance Stairs-Number of Steps: 2 (one is 3 ft high so has stool to step on per pt report) Home Layout: One level Home Equipment: Wheelchair - manual;Cane - single point Additional Comments: pt lives alone; son next door will check with him to see if he can assist her    Prior Function Level of Independence: Needs assistance         Comments: did the best she could; had difficulty with IADLs     Hand Dominance        Extremity/Trunk Assessment               Lower Extremity Assessment: LLE deficits/detail   LLE Deficits / Details: in knee brace, [place], [facility], then taken in w/c after walking for room transfer     Communication   Communication: No difficulties  Cognition Arousal/Alertness: Awake/alert Behavior During  Therapy: WFL for tasks assessed/performed Overall Cognitive Status: Within Functional Limits for tasks assessed                      General Comments      Exercises        Assessment/Plan    PT Assessment Patient needs continued PT services  PT Diagnosis Difficulty walking;Acute pain   PT Problem List Decreased activity tolerance;Pain;Decreased balance;Decreased knowledge of use of DME;Decreased mobility;Decreased range of motion  PT Treatment Interventions DME instruction;Gait  training;Functional mobility training;Therapeutic activities;Patient/family education;Balance training;Therapeutic exercise;Modalities;Stair training   PT Goals (Current goals can be found in the Care Plan section) Acute Rehab PT Goals Patient Stated Goal: to return to independent PT Goal Formulation: With patient Time For Goal Achievement: 11/16/13 Potential to Achieve Goals: Good    Frequency Min 3X/week   Barriers to discharge Decreased caregiver support unsure how much help son can give; pt worried her insurance may have $100/day fee for SNF    Co-evaluation               End of Session Equipment Utilized During Treatment: Gait belt;Left knee immobilizer Activity Tolerance: Patient limited by pain Patient left: in chair;Other (comment) (with RN/tech to transport)           Time: 1134-1200 PT Time Calculation (min): 26 min   Charges:   PT Evaluation $Initial PT Evaluation Tier I: 1 Procedure PT Treatments $Gait Training: 8-22 mins   PT G Codes:          WYNN,CYNDI 11/02/2013, 12:11 PM Sheran Lawlessyndi Wynn, PT [phone] 11/02/2013

## 2013-11-03 LAB — CBC
HEMATOCRIT: 29.7 % — AB (ref 36.0–46.0)
HEMOGLOBIN: 10 g/dL — AB (ref 12.0–15.0)
MCH: 31.7 pg (ref 26.0–34.0)
MCHC: 33.7 g/dL (ref 30.0–36.0)
MCV: 94.3 fL (ref 78.0–100.0)
Platelets: 206 10*3/uL (ref 150–400)
RBC: 3.15 MIL/uL — AB (ref 3.87–5.11)
RDW: 14.1 % (ref 11.5–15.5)
WBC: 3.5 10*3/uL — ABNORMAL LOW (ref 4.0–10.5)

## 2013-11-03 LAB — BASIC METABOLIC PANEL
Anion gap: 11 (ref 5–15)
BUN: 13 mg/dL (ref 6–23)
CHLORIDE: 106 meq/L (ref 96–112)
CO2: 20 mEq/L (ref 19–32)
Calcium: 8.2 mg/dL — ABNORMAL LOW (ref 8.4–10.5)
Creatinine, Ser: 1.08 mg/dL (ref 0.50–1.10)
GFR calc non Af Amer: 58 mL/min — ABNORMAL LOW (ref >90)
GFR, EST AFRICAN AMERICAN: 67 mL/min — AB (ref >90)
Glucose, Bld: 84 mg/dL (ref 70–99)
POTASSIUM: 4.3 meq/L (ref 3.7–5.3)
Sodium: 137 mEq/L (ref 137–147)

## 2013-11-03 MED ORDER — OXYCODONE-ACETAMINOPHEN 10-325 MG PO TABS: 1.0000 | ORAL_TABLET | ORAL | Status: DC | PRN

## 2013-11-03 MED ORDER — DSS 100 MG PO CAPS: 100.0000 mg | ORAL_CAPSULE | Freq: Two times a day (BID) | ORAL | Status: AC

## 2013-11-03 MED ORDER — ALPRAZOLAM 2 MG PO TABS: 2.0000 mg | ORAL_TABLET | Freq: Three times a day (TID) | ORAL | Status: DC | PRN

## 2013-11-03 MED ORDER — CYCLOBENZAPRINE HCL 10 MG PO TABS: 10.0000 mg | ORAL_TABLET | Freq: Three times a day (TID) | ORAL | Status: DC

## 2013-11-03 MED ORDER — MODAFINIL 200 MG PO TABS: 400.0000 mg | ORAL_TABLET | Freq: Every morning | ORAL | Status: DC

## 2013-11-03 MED ORDER — OXYCODONE HCL ER 30 MG PO T12A: 30.0000 mg | EXTENDED_RELEASE_TABLET | Freq: Three times a day (TID) | ORAL | Status: DC

## 2013-11-03 NOTE — Discharge Summary (Addendum)
Physician Discharge Summary  Valerie Silva:[id number] DOB: 21-Dec-1960 DOA: 10/31/2013  PCP: [name], MD  Admit date: 10/31/2013 Discharge date: 11/03/2013  Recommendations for Outpatient Follow-up:  1. Wrist splint to right wrist.  Follow up with orthopedics within one week of discharge for repeat x-ray to exclude scaphoid fracture.  Patient to call and schedule 2. Repeat BMP in one to 2 days to recheck sodium 3. Continue fluid restricted diet to 1.2 L 4. PT/OT at SNF 5. F/u with Dr. [name] at previously recommended interval  Discharge Diagnoses:  Principal Problem:   Hyponatremia Active Problems:   Hypothyroidism   Vitamin B 12 deficiency   Depression with anxiety   Dehydration   Hypokalemia   Iron deficiency anemia   SIADH (syndrome of inappropriate ADH production)   Discharge Condition: Stable, improved  Diet recommendation: Regular with 1.2 L fluid restriction  Wt Readings from Last 3 Encounters:  11/03/13 139.345 kg (307 lb 3.2 oz)  10/07/13 97.977 kg (216 lb)  09/30/13 98.884 kg (218 lb)    History of present illness:  53 year old female with bipolar disorder, extreme anxiety, 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  She was hospitalized from 9/25 through 9/29 with severe hyponatremia that was felt to be multifactorial secondary to SIADH and diuretics. She has had significant free water intake. She presented after she got tripped up by her dogs leash and fell on the concrete ground. She complained of left and right knee pain, right hand pain, generalized weakness. Her labs were notable for a sodium of 122. X-rays and CT of the head and neck were all unremarkable.  Hospital Course:   Severe hyperosmolar hyponatremia, likely a combination of dehydration, excess free water intake, Lexapro. Initially there was concern that she had adrenal insufficiency because  of previously abnormal cosyntropin stem test, intermittent use of steroids and hypertension. She was given IV fluids and she underwent a repeat cosyntropin stim test, the results of which were normal. Her TSH was 3.64. She did not have evidence of cirrhosis or heart failure on exam. Her chest x-ray was unremarkable.  She was seen by nephrology because she had a mixed picture with her low serum osmolality, urine +267, but serum sodium of 59.  They felt that she had hyponatremia secondary to SSRI, SIADH, and hypovolemia, and low-salt intake. Her sodium gradually events within normal limits with IV fluids and a brief period of sodium chloride tabs. Sodium chloride tabs have not been discontinued. She will need a repeat BMP in one to 2 days to ensure that her sodium levels remained normal. She should continue to have a regular diet with a 1.2 L fluid restriction. If possible, recommend that her Lexapro dose be gradually tapered and for transition to another antidepressant/antianxiety medication.  Hypotension, was likely secondary to medication side effects and dehydration. Her cosyntropin stem test was within normal limits. She had no evidence of hemorrhage or sepsis. Her blood pressure improved with IV fluids, however it remains in the low 100s and low 90s systolic. She was asymptomatic.  Fall with extensive bruising of the face, pain of the right wrist, bilateral knee pain. X-rays of the right wrist and left knee were unremarkable. She had some snuffbox tenderness in the right wrist for which she had a splint placed. She should follow up with her orthopedic surgeon in approximately one week to have repeat x-rays. She had a brace placed to the left knee for  comfort only. Her bruising seems to be improving. She worked with physical and occupational therapy recommended that she go to [facility] skilled nursing facility.  Chronic pain, she continued her home OxyContin and Percocet. She continued  diclofenac. Recommended she followup with her orthopedic physician who is managing her chronic pain.  Hypothyroidism, TSH was 2.64, she continued her Synthroid.  Bipolar disorder/depression/anxiety, stable. She continued her Xanax, Provigil. Recommended she have her Lexapro dose decreased slightly in taper off if possible under supervising physician.  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 She is followed by hematology as an outpatient and to continue her iron and vitamin B12 supplements. She should follow up with hematology at her already scheduled appointment.  Migraine headaches, remained stable on her home medications.  Hypokalemia, magnesium level within normal limits. This resolved with oral repletion.   Consultants:  Nephrology Procedures:  CT head  XR tibia/fibula/femur left  XR right wrist  CXR Antibiotics:  none    Discharge Exam: Filed Vitals:   11/03/13 0330  BP: 96/59  Pulse: 73  Temp: 98 F (36.7 C)  Resp: 16   Filed Vitals:   11/02/13 1205 11/02/13 1540 11/02/13 2135 11/03/13 0330  BP: 112/69 117/74 95/58 96/59   Pulse: 96  86 73  Temp: 98.1 F (36.7 C)  97.7 F (36.5 C) 98 F (36.7 C)  TempSrc: Oral  Oral Oral  Resp: 18  14 16   Height: 5\' 9"  (1.753 m)     Weight: 140 kg (308 lb 10.3 oz)   139.345 kg (307 lb 3.2 oz)  SpO2: 100%  97% 92%    General: WF, No acute distress  HEENT:  Bruising around both eyes.  Swelling decreasing above right eye, MMM, trach scar  Cardiovascular: RRR, nl S1, S2, no murmurs, 2+ pulses, warm extremities  Respiratory: CTAB, no increased WOB  Abdomen: NABS, soft, NT/ND  MSK: Normal tone and bulk, no LEE, left leg in splint, snuff box tenderness right wrist Neuro: Grossly intact   Discharge Instructions      Discharge Instructions   Call MD for:  difficulty breathing, headache or visual disturbances    Complete by:  As directed      Call MD for:  extreme fatigue    Complete by:  As  directed      Call MD for:  hives    Complete by:  As directed      Call MD for:  persistant dizziness or light-headedness    Complete by:  As directed      Call MD for:  persistant nausea and vomiting    Complete by:  As directed      Call MD for:  severe uncontrolled pain    Complete by:  As directed      Call MD for:  temperature >100.4    Complete by:  As directed      Diet general    Complete by:  As directed   1.2L fluid restriction     Driving Restrictions    Complete by:  As directed   No driving or operating heavy machinery.  Weight bearing as tolerated     Increase activity slowly    Complete by:  As directed             Medication List         acarbose 25 MG tablet  Commonly known as:  PRECOSE  Take 25 mg by mouth daily as needed (for low  blood sugar).     alprazolam 2 MG tablet  Commonly known as:  XANAX  Take 1-2 tablets (2-4 mg total) by mouth 3 (three) times daily as needed for sleep or anxiety (Pt takes 1 tablet twice a day during the day and 2 tablets at night.).     cyclobenzaprine 10 MG tablet  Commonly known as:  FLEXERIL  Take 1 tablet (10 mg total) by mouth 3 (three) times daily.     diclofenac 75 MG EC tablet  Commonly known as:  VOLTAREN  Take 75 mg by mouth 2 (two) times daily.     DSS 100 MG Caps  Take 100 mg by mouth 2 (two) times daily.     escitalopram 20 MG tablet  Commonly known as:  LEXAPRO  Take 1 tablet (20 mg total) by mouth daily. For depression and anxiety.     ibuprofen 800 MG tablet  Commonly known as:  ADVIL,MOTRIN  Take 800 mg by mouth every 6 (six) hours as needed for pain.     levothyroxine 112 MCG tablet  Commonly known as:  SYNTHROID, LEVOTHROID  Take 112-224 mcg by mouth daily before breakfast. Pt takes every day, but on Monday she takes 112 mcg.     loratadine 10 MG tablet  Commonly known as:  CLARITIN  Take 1 tablet (10 mg total) by mouth 2 (two) times daily.     modafinil 200 MG tablet  Commonly  known as:  PROVIGIL  Take 2 tablets (400 mg total) by mouth every morning.     omeprazole 40 MG capsule  Commonly known as:  PRILOSEC  Take 1 capsule (40 mg total) by mouth daily.     OxyCODONE HCl ER 30 MG T12a  Take 30 mg by mouth 3 (three) times daily.     oxyCODONE-acetaminophen 10-325 MG per tablet  Commonly known as:  PERCOCET  Take 1 tablet by mouth every 4 (four) hours as needed for pain.     promethazine 25 MG tablet  Commonly known as:  PHENERGAN  Take 25 mg by mouth every 8 (eight) hours as needed for nausea.     SUMAtriptan 100 MG tablet  Commonly known as:  IMITREX  Take 100 mg by mouth every 2 (two) hours as needed for migraine.     topiramate 200 MG tablet  Commonly known as:  TOPAMAX  Take 1 tablet (200 mg total) by mouth at bedtime.     valACYclovir 1000 MG tablet  Commonly known as:  VALTREX  Take 1,000 mg by mouth daily as needed (for flare ups).     VITAMIN B-12 IJ  Inject as directed every 28 (twenty-eight) days.     Vitamin D-3 5000 UNITS Tabs  Take 2 tablets by mouth daily.       Follow-up Information   Follow up with [name], MD. Schedule an appointment as soon as possible for a visit in 1 week.   Specialty:  Family Medicine   Contact information:   [address], [address] [place] [address] [phone]        The results of significant diagnostics from this hospitalization (including imaging, microbiology, ancillary and laboratory) are listed below for reference.    Significant Diagnostic Studies: X-ray Chest Pa And Lateral  10/31/2013   CLINICAL DATA:  53 year old female status post fall today outside, Face hematoma. Discovered to have critical abnormal sodium levels. Initial encounter.  EXAM: CHEST  2 VIEW  COMPARISON:  Chest radiographs  10/11/2013 and earlier.  FINDINGS: Upright AP and lateral views of the chest. Stable and normal lung volumes. Normal cardiac size and mediastinal contours. Visualized tracheal air  column is within normal limits. No pneumothorax, pulmonary edema, pleural effusion or confluent pulmonary opacity. Cervical ACDF hardware partially visible. Augmented upper lumbar compression fractures partially visible. No acute osseous abnormality identified. Stable ventral abdominal surgical clips.  IMPRESSION: No acute cardiopulmonary abnormality or acute traumatic injury identified.   Electronically Signed   By: Augusto Gamble M.D.   On: 10/31/2013 21:49   Dg Ribs Unilateral W/chest Right  10/11/2013   CLINICAL DATA:  Patient fell landing on back. Pain in the right lower posterior ribs.  EXAM: RIGHT RIBS AND CHEST - 3+ VIEW  COMPARISON:  Chest 07/07/2012  FINDINGS: Normal heart size and pulmonary vascularity. No focal airspace disease or consolidation in the lungs. No blunting of costophrenic angles. No pneumothorax. Mediastinal contours appear intact. Postoperative changes in the cervical spine. Surgical clips in the right upper quadrant.  Right ribs appear intact. No displaced fractures or focal bone lesions identified. Degenerative changes in the thoracolumbar spine with previous kyphoplasty changes at L1 and L2.  IMPRESSION: No evidence of active pulmonary disease. No displaced right rib fractures.   Electronically Signed   By: Burman Nieves M.D.   On: 10/11/2013 04:38   Dg Wrist Complete Right  10/31/2013   CLINICAL DATA:  Generalized wrist pain after falling today. Initial encounter.  EXAM: RIGHT WRIST - COMPLETE 3+ VIEW  COMPARISON:  None.  FINDINGS: The bones appear mildly demineralized. There is no evidence of acute fracture or dislocation. The joint spaces are maintained. There is no evidence of focal soft tissue swelling.  IMPRESSION: No acute osseous findings.   Electronically Signed   By: Roxy Horseman M.D.   On: 10/31/2013 20:03   Dg Hip Complete Right  10/11/2013   CLINICAL DATA:  Fall this morning, landed on back.  Buttock pain.  EXAM: RIGHT HIP - COMPLETE 2+ VIEW  COMPARISON:  None.   FINDINGS: There is no evidence of hip fracture or dislocation. There is no evidence of arthropathy or other focal bone abnormality. Surgical clips and phleboliths project in the pelvis. Moderate amount of partially imaged retained large bowel stool.  IMPRESSION: Negative.   Electronically Signed   By: Awilda Metro   On: 10/11/2013 04:41   Dg Femur Left  10/31/2013   CLINICAL DATA:  Pt's dog pulled her down while she was holding leash today.Pt fell on concrete.Left mid femur pain. Left mid tib/fib pain. Some bruising at mid tib/fib.  EXAM: LEFT FEMUR - 2 VIEW  COMPARISON:  None.  FINDINGS: There is no evidence of fracture or other focal bone lesions. Soft tissues are unremarkable.  IMPRESSION: No acute osseous injury of the left femur.   Electronically Signed   By: Elige Ko   On: 10/31/2013 12:40   Dg Tibia/fibula Left  10/31/2013   CLINICAL DATA:  Fall.  EXAM: LEFT TIBIA AND FIBULA - 2 VIEW  COMPARISON:  None.  FINDINGS: There is no evidence of fracture or other focal bone lesions. Soft tissues are unremarkable.  IMPRESSION: Normal left tibia and fibula.   Electronically Signed   By: Roque Lias M.D.   On: 10/31/2013 12:41   Ct Head Wo Contrast  10/31/2013   CLINICAL DATA:  Status post fall today. Hematoma right forehead. Initial encounter.  EXAM: CT HEAD WITHOUT CONTRAST  TECHNIQUE: Contiguous axial images were obtained from the  base of the skull through the vertex without intravenous contrast.  COMPARISON:  Head CT scan 10/11/2013 and 05/09/2013 be  FINDINGS: Hematoma is seen over the right frontal bone. There is no underlying fracture. The brain appears normal without hemorrhage, infarct, mass lesion, mass effect, midline shift or abnormal extra-axial fluid collection. No hydrocephalus or pneumocephalus. Imaged paranasal sinuses and mastoid air cells are clear.  IMPRESSION: Hematoma over the right frontal bone without underlying fracture or intracranial abnormality.   Electronically Signed    By: Drusilla Kannerhomas  Dalessio M.D.   On: 10/31/2013 12:21   Ct Head Wo Contrast  10/11/2013   CLINICAL DATA:  Larey SeatFell backwards while walking her dog, hit back of head. No loss of consciousness.  EXAM: CT HEAD WITHOUT CONTRAST  TECHNIQUE: Contiguous axial images were obtained from the base of the skull through the vertex without intravenous contrast.  COMPARISON:  CT of the head May 09, 2013  FINDINGS: The ventricles and sulci are normal. No intraparenchymal hemorrhage, mass effect nor midline shift. No acute large vascular territory infarcts.  No abnormal extra-axial fluid collections. Basal cisterns are patent. Trace calcific atherosclerosis of the carotid siphons.  No skull fracture. The included ocular globes and orbital contents are non-suspicious. The mastoid aircells and included paranasal sinuses are well-aerated.  IMPRESSION: No acute intracranial process; normal noncontrast CT of the head for age.   Electronically Signed   By: Awilda Metroourtnay  Bloomer   On: 10/11/2013 04:56    Microbiology: Recent Results (from the past 240 hour(s))  URINE CULTURE     Status: None   Collection Time    10/31/13  3:35 PM      Result Value Ref Range Status   Specimen Description URINE, CATHETERIZED   Final   Special Requests NONE   Final   Culture  Setup Time     Final   Value: 10/31/2013 21:09     Performed at [facility]   Colony Count     Final   Value: NO GROWTH     Performed at [facility]   Culture     Final   Value: NO GROWTH     Performed at [facility]   Report Status 11/01/2013 FINAL   Final  MRSA PCR SCREENING     Status: None   Collection Time    10/31/13  6:30 PM      Result Value Ref Range Status   MRSA by PCR NEGATIVE  NEGATIVE Final   Comment:            The GeneXpert MRSA Assay (FDA     approved for NASAL specimens     only), is one component of a     comprehensive MRSA colonization     surveillance program. It is not     intended to diagnose MRSA     infection  nor to guide or     monitor treatment for     MRSA infections.     [facility] at [facility]     Labs: Basic Metabolic Panel:  Recent Labs Lab 10/31/13 1352  11/01/13 0330 11/01/13 0942 11/01/13 1323 11/02/13 0555 11/03/13 0459  NA  --   < > 125* 125* 128* 132* 137  K  --   < > 3.4* 4.1 4.1 4.0 4.3  CL  --   < > 94* 94* 98 103 106  CO2  --   < > 20 21 21 19 20   GLUCOSE  --   < >  79 80 82 84 84  BUN  --   < > 16 13 13 11 13   CREATININE  --   < > 0.89 0.78 0.88 0.85 1.08  CALCIUM  --   < > 7.4* 7.8* 7.8* 7.7* 8.2*  MG 1.8  --   --   --   --   --   --   < > = values in this interval not displayed. Liver Function Tests: No results found for this basename: AST, ALT, ALKPHOS, BILITOT, PROT, ALBUMIN,  in the last 168 hours No results found for this basename: LIPASE, AMYLASE,  in the last 168 hours No results found for this basename: AMMONIA,  in the last 168 hours CBC:  Recent Labs Lab 10/31/13 1356 11/01/13 0330 11/02/13 0555 11/03/13 0459  WBC 5.0 3.6* 3.3* 3.5*  NEUTROABS 3.5  --   --   --   HGB 10.7* 9.6* 9.8* 10.0*  HCT 30.8* 28.0* 29.1* 29.7*  MCV 90.1 90.9 93.9 94.3  PLT 220 180 176 206   Cardiac Enzymes: No results found for this basename: CKTOTAL, CKMB, CKMBINDEX, TROPONINI,  in the last 168 hours BNP: BNP (last 3 results) No results found for this basename: PROBNP,  in the last 8760 hours CBG: No results found for this basename: GLUCAP,  in the last 168 hours  Time coordinating discharge: 35 minutes  Signed:  Brynn Reznik  [facility] 11/03/2013, 8:22 AM

## 2013-11-03 NOTE — Clinical Social Work Note (Signed)
CSW provided pt with bed offers for snf as well as advised her of her co pay coverage.  Pt plans to discuss with her son and CSW will assist with discharge.  CSW spoke with Dr. Malachi BondsShort who indicates D/C in am.  Pt was faxed out on 11/02/13 and anticipate more offers in am.  Valerie Silva, MSW, WilliamsonLCSWA [phone]

## 2013-11-03 NOTE — Progress Notes (Signed)
CARE MANAGEMENT NOTE 11/03/2013  Patient:  Valerie Silva,Valerie Silva   Account Number:  [id number]  Date Initiated:  11/01/2013  Documentation initiated by:  DAVIS,RHONDA  Subjective/Objective Assessment:   pt with hx of siadh and bipolar anexity disorder admitted due to hyponatremia-119/iv ns bolus and infusion started hypotensive on admitted     Action/Plan:   home when stable   Anticipated DC Date:  11/04/2013   Anticipated DC Plan:  SKILLED NURSING FACILITY  In-house referral  Clinical Social Worker      DC Planning Services  CM consult      [facility]  [facility]   Choice offered to / List presented to:  NA   DME arranged  NA      DME agency  NA     HH arranged  NA      HH agency  NA   Status of service:  Completed, signed off Medicare Important Message given?  YES (If response is "NO", the following Medicare IM given date fields will be blank) Date Medicare IM given:  11/03/2013 Medicare IM given by:  [facility]Jamiee [place] Date Additional Medicare IM given:   Additional Medicare IM given by:    Discharge Disposition:  SKILLED NURSING FACILITY  Per UR Regulation:  Reviewed for med. necessity/level of care/duration of stay  If discussed at Long Length of Stay Meetings, dates discussed:    Comments:  11/03/2013 No needs identified. Plan dc SNF.  Valerie DonningAlesia Thorvald Orsino RN CCM Case Mgmt phone [phone]  [phone]/Rhonda Davis,RN,BSN,CCM:

## 2013-11-03 NOTE — Plan of Care (Signed)
Problem: Phase I Progression Outcomes Goal: Initial discharge plan identified Outcome: Progressing DC to sniff when bed available

## 2013-11-04 LAB — BASIC METABOLIC PANEL
Anion gap: 11 (ref 5–15)
BUN: 18 mg/dL (ref 6–23)
CALCIUM: 8 mg/dL — AB (ref 8.4–10.5)
CO2: 19 mEq/L (ref 19–32)
Chloride: 107 mEq/L (ref 96–112)
Creatinine, Ser: 1.15 mg/dL — ABNORMAL HIGH (ref 0.50–1.10)
GFR calc Af Amer: 62 mL/min — ABNORMAL LOW (ref >90)
GFR, EST NON AFRICAN AMERICAN: 53 mL/min — AB (ref >90)
GLUCOSE: 79 mg/dL (ref 70–99)
Potassium: 4.4 mEq/L (ref 3.7–5.3)
SODIUM: 137 meq/L (ref 137–147)

## 2013-11-04 LAB — CBC
HCT: 27.9 % — ABNORMAL LOW (ref 36.0–46.0)
Hemoglobin: 9.2 g/dL — ABNORMAL LOW (ref 12.0–15.0)
MCH: 31.4 pg (ref 26.0–34.0)
MCHC: 33 g/dL (ref 30.0–36.0)
MCV: 95.2 fL (ref 78.0–100.0)
PLATELETS: 191 10*3/uL (ref 150–400)
RBC: 2.93 MIL/uL — ABNORMAL LOW (ref 3.87–5.11)
RDW: 14.3 % (ref 11.5–15.5)
WBC: 4.1 10*3/uL (ref 4.0–10.5)

## 2013-11-04 NOTE — Progress Notes (Signed)
[facility]. Pt transported via ambulance.

## 2013-11-04 NOTE — Progress Notes (Signed)
Patient feeling well.  VSS stable and exam unchanged.  Patient stable for transport to SNF when bed available.

## 2013-11-04 NOTE — Progress Notes (Signed)
OT Cancellation Note  Patient Details Name: Valerie Silva MRN: [id number] DOB: 1960/04/03   Cancelled Treatment:    Reason Eval/Treat Not Completed: Pain limiting ability to participate , pt. Reports 10/10 pain in L LE. Declines all attempts at skilled OT.  Note d/c scheduled for SNF likely today.  Will check back as pt. Able to tolerate, if still here.  Robet LeuMorris, Liahm Grivas [place], COTA/L 11/04/2013, 11:09 AM

## 2013-11-04 NOTE — Discharge Summary (Signed)
Physician Discharge Summary  Valerie Silva:[id number] DOB: 01/20/1960 DOA: 10/31/2013  PCP: [name], MD  Admit date: 10/31/2013 Discharge date: 11/04/2013  Recommendations for Outpatient Follow-up:  1. Wrist splint to right wrist.  Follow up with orthopedics within one week of discharge for repeat x-ray to exclude scaphoid fracture.  Patient to call and schedule 2. Repeat BMP in one week to recheck sodium 3. Continue fluid restricted diet to 1.2 L 4. PT/OT at SNF 5. F/u with Dr. [name] at previously recommended interval  Discharge Diagnoses:  Principal Problem:   Hyponatremia Active Problems:   Hypothyroidism   Vitamin B 12 deficiency   Depression with anxiety   Dehydration   Hypokalemia   Iron deficiency anemia   SIADH (syndrome of inappropriate ADH production)   Discharge Condition: Stable, improved  Diet recommendation: Regular with 1.2 L fluid restriction  Wt Readings from Last 3 Encounters:  11/04/13 104.5 kg (230 lb 6.1 oz)  10/07/13 97.977 kg (216 lb)  09/30/13 98.884 kg (218 lb)    History of present illness:  53 year old female with bipolar disorder, extreme anxiety, 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  She was hospitalized from 9/25 through 9/29 with severe hyponatremia that was felt to be multifactorial secondary to SIADH and diuretics. She has had significant free water intake. She presented after she got tripped up by her dogs leash and fell on the concrete ground. She complained of left and right knee pain, right hand pain, generalized weakness. Her labs were notable for a sodium of 122. X-rays and CT of the head and neck were all unremarkable.  Hospital Course:   Severe hyperosmolar hyponatremia, likely a combination of dehydration, excess free water intake, Lexapro. Initially there was concern that she had adrenal insufficiency because of  previously abnormal cosyntropin stem test, intermittent use of steroids and hypertension. She was given IV fluids and she underwent a repeat cosyntropin stim test, the results of which were normal. Her TSH was 3.64. She did not have evidence of cirrhosis or heart failure on exam. Her chest x-ray was unremarkable.  She was seen by nephrology because she had a mixed picture with her low serum osmolality, urine +267, but serum sodium of 59.  They felt that she had hyponatremia secondary to SSRI, SIADH, and hypovolemia, and low-salt intake. Her sodium gradually events within normal limits with IV fluids and a brief period of sodium chloride tabs. Sodium chloride tabs have not been discontinued. She will need a repeat BMP in one to 2 days to ensure that her sodium levels remained normal. She should continue to have a regular diet with a 1.2 L fluid restriction. If possible, recommend that her Lexapro dose be gradually tapered and for transition to another antidepressant/antianxiety medication.  Hypotension, was likely secondary to medication side effects and dehydration. Her cosyntropin stem test was within normal limits. She had no evidence of hemorrhage or sepsis. Her blood pressure improved with IV fluids, however it remains in the low 100s and low 90s systolic. She was asymptomatic.  Fall with extensive bruising of the face, pain of the right wrist, bilateral knee pain. X-rays of the right wrist and left knee were unremarkable. She had some snuffbox tenderness in the right wrist for which she had a splint placed. She should follow up with her orthopedic surgeon in approximately one week to have repeat x-rays. She had a brace placed to the left knee for comfort only.  Her bruising seems to be improving. She worked with physical and occupational therapy recommended that she go [facility] facility.  Chronic pain, she continued her home OxyContin and Percocet. She continued  diclofenac. Recommended she followup with her orthopedic physician who is managing her chronic pain.  Hypothyroidism, TSH was 2.64, she continued her Synthroid.  Bipolar disorder/depression/anxiety, stable. She continued her Xanax, Provigil. Recommended she have her Lexapro dose decreased slightly in taper off if possible under supervising physician.  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 She is followed by hematology as an outpatient and to continue her iron and vitamin B12 supplements. She should follow up with hematology at her already scheduled appointment.  Migraine headaches, remained stable on her home medications.  Hypokalemia, magnesium level within normal limits. This resolved with oral repletion.   Consultants:  Nephrology Procedures:  CT head  XR tibia/fibula/femur left  XR right wrist  CXR Antibiotics:  none    Discharge Exam: Filed Vitals:   11/04/13 0424  BP: 104/67  Pulse: 80  Temp: 97.7 F (36.5 C)  Resp: 16   Filed Vitals:   11/03/13 0330 11/03/13 1500 11/03/13 2145 11/04/13 0424  BP: 96/59 98/52 103/66 104/67  Pulse: 73 88 80 80  Temp: 98 F (36.7 C) 98.1 F (36.7 C) 97.5 F (36.4 C) 97.7 F (36.5 C)  TempSrc: Oral Oral Oral Oral  Resp: 16 16 16 16   Height:      Weight: 139.345 kg (307 lb 3.2 oz)   104.5 kg (230 lb 6.1 oz)  SpO2: 92% 96% 95% 94%    General: WF, No acute distress  HEENT:  Bruising around both eyes.  Swelling decreasing above right eye, chin bruising resolving, MMM, trach scar  Cardiovascular: RRR, nl S1, S2, no murmurs, 2+ pulses, warm extremities  Respiratory: CTAB, no increased WOB  Abdomen: NABS, soft, NT/ND  MSK: Normal tone and bulk, no LEE, left leg in splint, snuff box tenderness right wrist, wrist brace now in place Neuro: Grossly intact   Discharge Instructions      Discharge Instructions   Call MD for:  difficulty breathing, headache or visual disturbances    Complete by:  As  directed      Call MD for:  extreme fatigue    Complete by:  As directed      Call MD for:  hives    Complete by:  As directed      Call MD for:  persistant dizziness or light-headedness    Complete by:  As directed      Call MD for:  persistant nausea and vomiting    Complete by:  As directed      Call MD for:  severe uncontrolled pain    Complete by:  As directed      Call MD for:  temperature >100.4    Complete by:  As directed      Diet general    Complete by:  As directed   1.2L fluid restriction     Driving Restrictions    Complete by:  As directed   No driving or operating heavy machinery.  Weight bearing as tolerated     Increase activity slowly    Complete by:  As directed             Medication List         acarbose 25 MG tablet  Commonly known as:  PRECOSE  Take 25 mg by  mouth daily as needed (for low blood sugar).     alprazolam 2 MG tablet  Commonly known as:  XANAX  Take 1-2 tablets (2-4 mg total) by mouth 3 (three) times daily as needed for sleep or anxiety (Pt takes 1 tablet twice a day during the day and 2 tablets at night.).     cyclobenzaprine 10 MG tablet  Commonly known as:  FLEXERIL  Take 1 tablet (10 mg total) by mouth 3 (three) times daily.     diclofenac 75 MG EC tablet  Commonly known as:  VOLTAREN  Take 75 mg by mouth 2 (two) times daily.     DSS 100 MG Caps  Take 100 mg by mouth 2 (two) times daily.     escitalopram 20 MG tablet  Commonly known as:  LEXAPRO  Take 1 tablet (20 mg total) by mouth daily. For depression and anxiety.     ibuprofen 800 MG tablet  Commonly known as:  ADVIL,MOTRIN  Take 800 mg by mouth every 6 (six) hours as needed for pain.     levothyroxine 112 MCG tablet  Commonly known as:  SYNTHROID, LEVOTHROID  Take 112-224 mcg by mouth daily before breakfast. Pt takes 224mcg every day, but on Monday she takes 112 mcg.     loratadine 10 MG tablet  Commonly known as:  CLARITIN  Take 1 tablet (10 mg total) by mouth  2 (two) times daily.     modafinil 200 MG tablet  Commonly known as:  PROVIGIL  Take 2 tablets (400 mg total) by mouth every morning.     omeprazole 40 MG capsule  Commonly known as:  PRILOSEC  Take 1 capsule (40 mg total) by mouth daily.     OxyCODONE HCl ER 30 MG T12a  Take 30 mg by mouth 3 (three) times daily.     oxyCODONE-acetaminophen 10-325 MG per tablet  Commonly known as:  PERCOCET  Take 1 tablet by mouth every 4 (four) hours as needed for pain.     promethazine 25 MG tablet  Commonly known as:  PHENERGAN  Take 25 mg by mouth every 8 (eight) hours as needed for nausea.     SUMAtriptan 100 MG tablet  Commonly known as:  IMITREX  Take 100 mg by mouth every 2 (two) hours as needed for migraine.     topiramate 200 MG tablet  Commonly known as:  TOPAMAX  Take 1 tablet (200 mg total) by mouth at bedtime.     valACYclovir 1000 MG tablet  Commonly known as:  VALTREX  Take 1,000 mg by mouth daily as needed (for flare ups).     VITAMIN B-12 IJ  Inject as directed every 28 (twenty-eight) days.     Vitamin D-3 5000 UNITS Tabs  Take 2 tablets by mouth daily.       Follow-up Information   Follow up with [name],[name], MD. Schedule an appointment as soon as possible for a visit in 1 week.   Specialty:  Family Medicine   Contact information:   [address], [address] [place] [address] [phone]        The results of significant diagnostics from this hospitalization (including imaging, microbiology, ancillary and laboratory) are listed below for reference.    Significant Diagnostic Studies: X-ray Chest Pa And Lateral  10/31/2013   CLINICAL DATA:  53 year old female status post fall today outside, Face hematoma. Discovered to have critical abnormal sodium levels. Initial encounter.  EXAM: CHEST  2  VIEW  COMPARISON:  Chest radiographs 10/11/2013 and earlier.  FINDINGS: Upright AP and lateral views of the chest. Stable and normal lung volumes. Normal  cardiac size and mediastinal contours. Visualized tracheal air column is within normal limits. No pneumothorax, pulmonary edema, pleural effusion or confluent pulmonary opacity. Cervical ACDF hardware partially visible. Augmented upper lumbar compression fractures partially visible. No acute osseous abnormality identified. Stable ventral abdominal surgical clips.  IMPRESSION: No acute cardiopulmonary abnormality or acute traumatic injury identified.   Electronically Signed   By: Augusto GambleLee  Hall M.D.   On: 10/31/2013 21:49   Dg Ribs Unilateral W/chest Right  10/11/2013   CLINICAL DATA:  Patient fell landing on back. Pain in the right lower posterior ribs.  EXAM: RIGHT RIBS AND CHEST - 3+ VIEW  COMPARISON:  Chest 07/07/2012  FINDINGS: Normal heart size and pulmonary vascularity. No focal airspace disease or consolidation in the lungs. No blunting of costophrenic angles. No pneumothorax. Mediastinal contours appear intact. Postoperative changes in the cervical spine. Surgical clips in the right upper quadrant.  Right ribs appear intact. No displaced fractures or focal bone lesions identified. Degenerative changes in the thoracolumbar spine with previous kyphoplasty changes at L1 and L2.  IMPRESSION: No evidence of active pulmonary disease. No displaced right rib fractures.   Electronically Signed   By: Burman NievesWilliam  Stevens M.D.   On: 10/11/2013 04:38   Dg Wrist Complete Right  10/31/2013   CLINICAL DATA:  Generalized wrist pain after falling today. Initial encounter.  EXAM: RIGHT WRIST - COMPLETE 3+ VIEW  COMPARISON:  None.  FINDINGS: The bones appear mildly demineralized. There is no evidence of acute fracture or dislocation. The joint spaces are maintained. There is no evidence of focal soft tissue swelling.  IMPRESSION: No acute osseous findings.   Electronically Signed   By: Roxy HorsemanBill  Veazey M.D.   On: 10/31/2013 20:03   Dg Hip Complete Right  10/11/2013   CLINICAL DATA:  Fall this morning, landed on back.  Buttock  pain.  EXAM: RIGHT HIP - COMPLETE 2+ VIEW  COMPARISON:  None.  FINDINGS: There is no evidence of hip fracture or dislocation. There is no evidence of arthropathy or other focal bone abnormality. Surgical clips and phleboliths project in the pelvis. Moderate amount of partially imaged retained large bowel stool.  IMPRESSION: Negative.   Electronically Signed   By: Awilda Metroourtnay  Bloomer   On: 10/11/2013 04:41   Dg Femur Left  10/31/2013   CLINICAL DATA:  Pt's dog pulled her down while she was holding leash today.Pt fell on concrete.Left mid femur pain. Left mid tib/fib pain. Some bruising at mid tib/fib.  EXAM: LEFT FEMUR - 2 VIEW  COMPARISON:  None.  FINDINGS: There is no evidence of fracture or other focal bone lesions. Soft tissues are unremarkable.  IMPRESSION: No acute osseous injury of the left femur.   Electronically Signed   By: Elige KoHetal  Patel   On: 10/31/2013 12:40   Dg Tibia/fibula Left  10/31/2013   CLINICAL DATA:  Fall.  EXAM: LEFT TIBIA AND FIBULA - 2 VIEW  COMPARISON:  None.  FINDINGS: There is no evidence of fracture or other focal bone lesions. Soft tissues are unremarkable.  IMPRESSION: Normal left tibia and fibula.   Electronically Signed   By: Roque LiasJames  Green M.D.   On: 10/31/2013 12:41   Ct Head Wo Contrast  10/31/2013   CLINICAL DATA:  Status post fall today. Hematoma right forehead. Initial encounter.  EXAM: CT HEAD WITHOUT CONTRAST  TECHNIQUE: Contiguous  axial images were obtained from the base of the skull through the vertex without intravenous contrast.  COMPARISON:  Head CT scan 10/11/2013 and 05/09/2013 be  FINDINGS: Hematoma is seen over the right frontal bone. There is no underlying fracture. The brain appears normal without hemorrhage, infarct, mass lesion, mass effect, midline shift or abnormal extra-axial fluid collection. No hydrocephalus or pneumocephalus. Imaged paranasal sinuses and mastoid air cells are clear.  IMPRESSION: Hematoma over the right frontal bone without underlying  fracture or intracranial abnormality.   Electronically Signed   By: Drusilla Kanner M.D.   On: 10/31/2013 12:21   Ct Head Wo Contrast  10/11/2013   CLINICAL DATA:  Larey Seat backwards while walking her dog, hit back of head. No loss of consciousness.  EXAM: CT HEAD WITHOUT CONTRAST  TECHNIQUE: Contiguous axial images were obtained from the base of the skull through the vertex without intravenous contrast.  COMPARISON:  CT of the head May 09, 2013  FINDINGS: The ventricles and sulci are normal. No intraparenchymal hemorrhage, mass effect nor midline shift. No acute large vascular territory infarcts.  No abnormal extra-axial fluid collections. Basal cisterns are patent. Trace calcific atherosclerosis of the carotid siphons.  No skull fracture. The included ocular globes and orbital contents are non-suspicious. The mastoid aircells and included paranasal sinuses are well-aerated.  IMPRESSION: No acute intracranial process; normal noncontrast CT of the head for age.   Electronically Signed   By: Awilda Metro   On: 10/11/2013 04:56    Microbiology: Recent Results (from the past 240 hour(s))  URINE CULTURE     Status: None   Collection Time    10/31/13  3:35 PM      Result Value Ref Range Status   Specimen Description URINE, CATHETERIZED   Final   Special Requests NONE   Final   Culture  Setup Time     Final   Value: 10/31/2013 21:09     Performed at [facility]   Colony Count     Final   Value: NO GROWTH     Performed at [facility]   Culture     Final   Value: NO GROWTH     Performed at [facility]   Report Status 11/01/2013 FINAL   Final  MRSA PCR SCREENING     Status: None   Collection Time    10/31/13  6:30 PM      Result Value Ref Range Status   MRSA by PCR NEGATIVE  NEGATIVE Final   Comment:            The GeneXpert MRSA Assay (FDA     approved for NASAL specimens     only), is one component of a     comprehensive MRSA colonization     surveillance  program. It is not     intended to diagnose MRSA     infection nor to guide or     monitor treatment for     MRSA infections.     Performed at [facility]     Labs: Basic Metabolic Panel:  Recent Labs Lab 10/31/13 1352  11/01/13 0942 11/01/13 1323 11/02/13 0555 11/03/13 0459 11/04/13 0410  NA  --   < > 125* 128* 132* 137 137  K  --   < > 4.1 4.1 4.0 4.3 4.4  CL  --   < > 94* 98 103 106 107  CO2  --   < > 21 21 19  20 19  GLUCOSE  --   < > 80 82 84 84 79  BUN  --   < > 13 13 11 13 18   CREATININE  --   < > 0.78 0.88 0.85 1.08 1.15*  CALCIUM  --   < > 7.8* 7.8* 7.7* 8.2* 8.0*  MG 1.8  --   --   --   --   --   --   < > = values in this interval not displayed. Liver Function Tests: No results found for this basename: AST, ALT, ALKPHOS, BILITOT, PROT, ALBUMIN,  in the last 168 hours No results found for this basename: LIPASE, AMYLASE,  in the last 168 hours No results found for this basename: AMMONIA,  in the last 168 hours CBC:  Recent Labs Lab 10/31/13 1356 11/01/13 0330 11/02/13 0555 11/03/13 0459 11/04/13 0410  WBC 5.0 3.6* 3.3* 3.5* 4.1  NEUTROABS 3.5  --   --   --   --   HGB 10.7* 9.6* 9.8* 10.0* 9.2*  HCT 30.8* 28.0* 29.1* 29.7* 27.9*  MCV 90.1 90.9 93.9 94.3 95.2  PLT 220 180 176 206 191   Cardiac Enzymes: No results found for this basename: CKTOTAL, CKMB, CKMBINDEX, TROPONINI,  in the last 168 hours BNP: BNP (last 3 results) No results found for this basename: PROBNP,  in the last 8760 hours CBG: No results found for this basename: GLUCAP,  in the last 168 hours  Time coordinating discharge: 35 minutes  Signed:  Daxx Tiggs  [facility] 11/04/2013, 8:26 AM

## 2013-11-04 NOTE — Progress Notes (Addendum)
CSW continuing to follow.  CSW followed up with pt at bedside. CSW updated pt with updated bed offers. CSW discussed with pt that she is medically ready for discharge today and MD has placed discharge orders.  Pt states that she wants to discuss with pt son before making a decision.   CSW to follow up with pt re: SNF decision and facilitate pt discharge needs this afternoon.  Addendum 4:00 pm:  CSW visited pt at bedside.  CSW discussed with pt that decision needed for SNF as pt for discharge today.  Pt states that she chooses [facility] as facility has private room available.  CSW provided support as pt expressed frustration with pt son as pt son was supposed to be touring facilities, but pt has not yet heard from him. Pt agreeable [facility] today.  [facility] and confirmed bed availability for today and that facility will accept pt late this afternoon.  CSW facilitated pt discharge needs including contacting facility, faxing pt discharge information via [facility], discussing with pt at bedside, providing RN phone number to call report, and arranging ambulance transport via [facility].  No further social work needs identified at this time.  CSW signing off.   Loletta SpecterSuzanna Kidd, MSW, LCSW Clinical Social Work [phone]

## 2013-11-05 ENCOUNTER — Other Ambulatory Visit: Payer: Self-pay

## 2013-11-05 DIAGNOSIS — D510 Vitamin B12 deficiency anemia due to intrinsic factor deficiency: Secondary | ICD-10-CM

## 2013-11-05 DIAGNOSIS — F418 Other specified anxiety disorders: Secondary | ICD-10-CM

## 2013-11-05 MED ORDER — MODAFINIL 200 MG PO TABS
400.0000 mg | ORAL_TABLET | Freq: Every morning | ORAL | Status: AC
Start: 2013-11-05 — End: ?

## 2013-11-05 MED ORDER — OXYCODONE-ACETAMINOPHEN 10-325 MG PO TABS: 1.0000 | ORAL_TABLET | ORAL | Status: DC | PRN

## 2013-11-05 MED ORDER — OXYCODONE HCL ER 30 MG PO T12A: 30.0000 mg | EXTENDED_RELEASE_TABLET | Freq: Three times a day (TID) | ORAL | Status: DC

## 2013-11-05 MED ORDER — ALPRAZOLAM 2 MG PO TABS: ORAL_TABLET | ORAL | Status: DC

## 2013-11-05 NOTE — Telephone Encounter (Signed)
RX sent to [facility] @ [phone]. Phone number [phone]  

## 2013-11-07 ENCOUNTER — Non-Acute Institutional Stay (SKILLED_NURSING_FACILITY): Payer: Medicare Other | Admitting: Internal Medicine

## 2013-11-07 DIAGNOSIS — D509 Iron deficiency anemia, unspecified: Secondary | ICD-10-CM

## 2013-11-07 DIAGNOSIS — F3130 Bipolar disorder, current episode depressed, mild or moderate severity, unspecified: Secondary | ICD-10-CM

## 2013-11-07 DIAGNOSIS — E871 Hypo-osmolality and hyponatremia: Secondary | ICD-10-CM

## 2013-11-07 DIAGNOSIS — E222 Syndrome of inappropriate secretion of antidiuretic hormone: Secondary | ICD-10-CM

## 2013-11-07 DIAGNOSIS — I9589 Other hypotension: Secondary | ICD-10-CM

## 2013-11-07 DIAGNOSIS — Y92009 Unspecified place in unspecified non-institutional (private) residence as the place of occurrence of the external cause: Secondary | ICD-10-CM

## 2013-11-07 DIAGNOSIS — E038 Other specified hypothyroidism: Secondary | ICD-10-CM

## 2013-11-07 DIAGNOSIS — G894 Chronic pain syndrome: Secondary | ICD-10-CM

## 2013-11-07 DIAGNOSIS — W19XXXD Unspecified fall, subsequent encounter: Secondary | ICD-10-CM

## 2013-11-07 DIAGNOSIS — E034 Atrophy of thyroid (acquired): Secondary | ICD-10-CM

## 2013-11-07 DIAGNOSIS — D510 Vitamin B12 deficiency anemia due to intrinsic factor deficiency: Secondary | ICD-10-CM

## 2013-11-07 NOTE — Progress Notes (Signed)
MRN: [id number] Name: Valerie Silva  Sex: female Age: 53 y.o. DOB: 04/04/1960  [id number] Facility/Room:107 Level Of Care: SNF Provider: Merrilee SeashoreALEXANDER, Mildred Tuccillo D Emergency Contacts: Extended Emergency Contact Information Primary Emergency Contact: Bethel Bornosta,Patricia          [place], [place] Home Phone: [phone] Relation: Sister Secondary Emergency Contact: Salome ArntNemeth,Kyle  [place] Home Phone: [phone] Relation: Son    Allergies: Iodine; Iron; Lovaza; Soma; Sulfonamide derivatives; Benazepril hcl; Codeine; Penicillins; Zolpidem tartrate; and Amitriptyline  Chief Complaint  Patient presents with  . New Admit To SNF    HPI: Patient is 53 y.o. female who fell while severely hyponatremic and sustained multiple bruising.  Past Medical History  Diagnosis Date  . Osteoporosis   . Hypothyroidism   . Diabetes mellitus   . Exogenous obesity   . Rhinitis   . Arthritis   . Fibromyalgia   . Somnolence   . Sleep apnea   . Anemia, iron deficiency 04/06/2011  . Pernicious anemia 04/06/2011  . Depression   . Anxiety   . Iron deficiency anemia, unspecified 12/12/2012    Past Surgical History  Procedure Laterality Date  . Gastric bypass    . Cholecystectomy    . Tracheostomy    . Uvulectomy    . Tonsillectomy    . Mandible reconstruction    . Spinal fusion    . Dilation and curettage of uterus    . Cesarean section        Medication List       This list is accurate as of: 11/07/13 11:59 PM.  Always use your most recent med list.               acarbose 25 MG tablet  Commonly known as:  PRECOSE  Take 25 mg by mouth daily as needed (for low blood sugar).     alprazolam 2 MG tablet  Commonly known as:  XANAX  1 by mouth twice daily as needed for anxiety, 2 by mouth at bedtime as needed for sleep or anxiety     alprazolam 2 MG tablet  Commonly known as:  XANAX  Take two tablets by mouth twice daily as needed for anxiety; Take  two tablets by mouth at bedtime as needed for sleep or anxiety     cyclobenzaprine 10 MG tablet  Commonly known as:  FLEXERIL  Take 1 tablet (10 mg total) by mouth 3 (three) times daily.     DSS 100 MG Caps  Take 100 mg by mouth 2 (two) times daily.     escitalopram 20 MG tablet  Commonly known as:  LEXAPRO  Take 1 tablet (20 mg total) by mouth daily. For depression and anxiety.     ibuprofen 800 MG tablet  Commonly known as:  ADVIL,MOTRIN  Take 800 mg by mouth every 6 (six) hours as needed for pain.     levothyroxine 112 MCG tablet  Commonly known as:  SYNTHROID, LEVOTHROID  Take 112-224 mcg by mouth daily before breakfast. Pt takes 224mcg every day, but on Monday she takes 112 mcg.     loratadine 10 MG tablet  Commonly known as:  CLARITIN  Take 1 tablet (10 mg total) by mouth 2 (two) times daily.     modafinil 200 MG tablet  Commonly known as:  PROVIGIL  Take 2 tablets (400 mg total) by mouth every morning.     omeprazole 40 MG capsule  Commonly known as:  PRILOSEC  Take 1 capsule (40 mg total) by mouth daily.     OxyCODONE HCl ER 30 MG T12a  Commonly known as:  OXYCONTIN  Take 30 mg by mouth 3 (three) times daily.     oxyCODONE-acetaminophen 10-325 MG per tablet  Commonly known as:  PERCOCET  Take 1 tablet by mouth every 4 (four) hours as needed for pain (DO NOT EXCEED 3GM APAP/24 HR).     promethazine 25 MG tablet  Commonly known as:  PHENERGAN  Take 25 mg by mouth every 8 (eight) hours as needed for nausea.     SUMAtriptan 100 MG tablet  Commonly known as:  IMITREX  Take 100 mg by mouth every 2 (two) hours as needed for migraine.     topiramate 200 MG tablet  Commonly known as:  TOPAMAX  Take 1 tablet (200 mg total) by mouth at bedtime.     valACYclovir 1000 MG tablet  Commonly known as:  VALTREX  Take 1,000 mg by mouth daily as needed (for flare ups).     VITAMIN B-12 IJ  Inject as directed every 28 (twenty-eight) days.     Vitamin D-3 5000 UNITS Tabs   Take 2 tablets by mouth daily.        No orders of the defined types were placed in this encounter.     There is no immunization history on file for this patient.  History  Substance Use Topics  . Smoking status: Never Smoker   . Smokeless tobacco: Never Used     Comment: never used tobacco  . Alcohol Use: Yes     Comment: one mixed drink twice yearly    Family history is noncontributory    Review of Systems  DATA OBTAINED: from patient GENERAL:  no fevers, fatigue, appetite changes; pt was interested in 3 things-her narcotics, her xanax and her water SKIN: No itching, rash or wounds EYES: No eye pain, redness, discharge EARS: No earache, tinnitus, change in hearing NOSE: No congestion, drainage or bleeding  MOUTH/THROAT: No mouth or tooth pain, No sore throat RESPIRATORY: No cough, wheezing, SOB CARDIAC: No chest pain, palpitations, lower extremity edema  GI: No abdominal pain, No N/V/D or constipation, No heartburn or reflux  GU: No dysuria, frequency or urgency, or incontinence  MUSCULOSKELETAL: constant pain in back NEUROLOGIC: No headache, dizziness or focal weakness PSYCHIATRIC:  No behavior issue.   Filed Vitals:   11/07/13 2051  BP: 156/74  Pulse: 78  Temp: 97.8 F (36.6 C)  Resp: 20    Physical Exam  GENERAL APPEARANCE: Alert, conversant,  No acute distress.  SKIN: B black eyes, other assorted bruises HEAD: Normocephalic  EYES: Conjunctiva/lids clear. Pupils round, reactive. EOMs intact.  EARS: External exam WNL, canals clear. Hearing grossly normal.  NOSE: No deformity or discharge.  MOUTH/THROAT: Lips w/o lesions  RESPIRATORY: Breathing is even, unlabored. Lung sounds are clear   CARDIOVASCULAR: Heart RRR no murmurs, rubs or gallops. No peripheral edema.   GASTROINTESTINAL: Abdomen is soft, non-tender, not distended w/ normal bowel sounds. GENITOURINARY: Bladder non tender, not distended  MUSCULOSKELETAL: No abnormal joints or  musculature NEUROLOGIC:  Cranial nerves 2-12 grossly intact. Moves all extremities  PSYCHIATRIC, no behavioral issues  Patient Active Problem List   Diagnosis Date Noted  . Fall at home 11/11/2013  . Chronic pain syndrome 11/11/2013  . Bipolar I disorder, most recent episode depressed 11/11/2013  . SIADH (syndrome of inappropriate ADH production) 10/06/2013  . Iron deficiency anemia 12/12/2012  . Intentional  benzodiazepine overdose 07/09/2012  . Acute encephalopathy 07/07/2012  . Hypoxia 07/07/2012  . Hypocalcemia 07/07/2012  . Dyspnea on exertion 05/31/2012  . Chest pain, musculoskeletal 05/30/2012  . Hypotension 05/30/2012  . Dehydration 05/30/2012  . Hypokalemia 05/30/2012  . Hypoglycemia 09/20/2011  . Hyponatremia 09/20/2011  . Abnormal LFTs 09/20/2011  . Leg swelling 07/31/2011  . Depression with anxiety 07/31/2011  . Chronic anemia 07/31/2011  . Vitamin B 12 deficiency 07/19/2011  . Pernicious anemia 04/06/2011  . Hypothyroidism 01/10/2008  . Hypoglycemia, unspecified 01/10/2008  . EXOGENOUS OBESITY 01/10/2008  . RHINITIS 01/10/2008  . ARTHRITIS 01/10/2008  . FIBROMYALGIA 01/10/2008  . SOMNOLENCE 01/10/2008  . SLEEP APNEA 01/10/2008    CBC    Component Value Date/Time   WBC 4.1 11/04/2013 0410   WBC 3.8* 09/30/2013 1326   WBC 6.3 05/25/2006 0822   RBC 2.93* 11/04/2013 0410   RBC 3.30* 09/30/2013 1326   RBC 3.74* 07/01/2013 1313   RBC 4.45 05/25/2006 0822   HGB 9.2* 11/04/2013 0410   HGB 10.3* 09/30/2013 1326   HGB 11.8 05/25/2006 0822   HCT 27.9* 11/04/2013 0410   HCT 32.1* 09/30/2013 1326   HCT 36.4 05/25/2006 0822   PLT 191 11/04/2013 0410   PLT 197 09/30/2013 1326   PLT 203 05/25/2006 0822   MCV 95.2 11/04/2013 0410   MCV 97 09/30/2013 1326   MCV 82.0 05/25/2006 0822   LYMPHSABS 1.1 10/31/2013 1356   LYMPHSABS 1.3 09/30/2013 1326   LYMPHSABS 1.2 05/25/2006 0822   MONOABS 0.4 10/31/2013 1356   MONOABS 0.3 05/25/2006 0822   EOSABS 0.0 10/31/2013  1356   EOSABS 0.0 09/30/2013 1326   EOSABS 0.1 05/25/2006 0822   BASOSABS 0.0 10/31/2013 1356   BASOSABS 0.0 09/30/2013 1326   BASOSABS 0.0 05/25/2006 0822    CMP     Component Value Date/Time   NA 137 11/04/2013 0410   NA 133 07/01/2013 1313   NA 136 03/06/2013 1619   K 4.4 11/04/2013 0410   K 3.9 07/01/2013 1313   K 4.4 03/06/2013 1619   CL 107 11/04/2013 0410   CL 99 07/01/2013 1313   CO2 19 11/04/2013 0410   CO2 23 07/01/2013 1313   CO2 23 03/06/2013 1619   GLUCOSE 79 11/04/2013 0410   GLUCOSE 85 07/01/2013 1313   GLUCOSE 106 03/06/2013 1619   BUN 18 11/04/2013 0410   BUN 21 07/01/2013 1313   BUN 27.1* 03/06/2013 1619   CREATININE 1.15* 11/04/2013 0410   CREATININE 0.9 07/01/2013 1313   CREATININE 1.0 03/06/2013 1619   CALCIUM 8.0* 11/04/2013 0410   CALCIUM 8.6 07/01/2013 1313   CALCIUM 8.8 03/06/2013 1619   PROT 7.1 10/04/2013 1336   PROT 6.2* 07/01/2013 1313   PROT 6.6 03/06/2013 1619   ALBUMIN 3.8 10/04/2013 1336   ALBUMIN 3.4* 03/06/2013 1619   AST 14 10/04/2013 1336   AST 26 07/01/2013 1313   AST 44* 03/06/2013 1619   ALT 19 10/04/2013 1336   ALT 18 07/01/2013 1313   ALT 36 03/06/2013 1619   ALKPHOS 83 10/04/2013 1336   ALKPHOS 80 07/01/2013 1313   ALKPHOS 105 03/06/2013 1619   BILITOT 0.2* 10/04/2013 1336   BILITOT 0.50 07/01/2013 1313   BILITOT 0.50 03/06/2013 1619   GFRNONAA 53* 11/04/2013 0410   GFRAA 62* 11/04/2013 0410    Assessment and Plan  Hyponatremia likely a combination of dehydration, excess free water intake, Lexapro. Initially there was concern that she had adrenal insufficiency because of previously  abnormal cosyntropin stem test, intermittent use of steroids and hypertension. She was given IV fluids and she underwent a repeat cosyntropin stim test, the results of which were normal. Her TSH was 3.64. She did not have evidence of cirrhosis or heart failure on exam. Her chest x-ray was unremarkable. She was seen by nephrology because  she had a mixed picture with her low serum osmolality, urine +267, but serum sodium of 59. They felt that she had hyponatremia secondary to SSRI, SIADH, and hypovolemia, and low-salt intake. Her sodium gradually events within normal limits with IV fluids and a brief period of sodium chloride tabs. Sodium chloride tabs have not been discontinued. She will need a repeat BMP in one to 2 days to ensure that her sodium levels remained normal. She should continue to have a regular diet with a 1.2 L fluid restriction. If possible, recommend that her Lexapro dose be gradually tapered and for transition to another antidepressant/antianxiety medication Will continue to be an issue for her- water is another addiction  SIADH (syndrome of inappropriate ADH production) likely a combination of dehydration, excess free water intake, Lexapro. Initially there was concern that she had adrenal insufficiency because of previously abnormal cosyntropin stem test, intermittent use of steroids and hypertension. She was given IV fluids and she underwent a repeat cosyntropin stim test, the results of which were normal. Her TSH was 3.64. She did not have evidence of cirrhosis or heart failure on exam. Her chest x-ray was unremarkable. She was seen by nephrology because she had a mixed picture with her low serum osmolality, urine +267, but serum sodium of 59. They felt that she had hyponatremia secondary to SSRI, SIADH, and hypovolemia, and low-salt intake. Her sodium gradually events within normal limits with IV fluids and a brief period of sodium chloride tabs. Sodium chloride tabs have not been discontinued. She will need a repeat BMP in one to 2 days to ensure that her sodium levels remained normal. She should continue to have a regular diet with a 1.2 L fluid restriction. If possible, recommend that her Lexapro dose be gradually tapered and for transition to another antidepressant/antianxiety medication  Hypotension was likely  secondary to medication side effects and dehydration. Her cosyntropin stem test was within normal limits. She had no evidence of hemorrhage or sepsis. Her blood pressure improved with IV fluids, however it remains in the low 100s and low 90s systolic. She was asymptomatic.   Fall at home , pain of the right wrist, bilateral knee pain. X-rays of the right wrist and left knee were unremarkable. She had some snuffbox tenderness in the right wrist for which she had a splint placed. She should follow up with her orthopedic surgeon in approximately one week to have repeat x-rays. She had a brace placed to the left knee for comfort only. Her bruising seems to be improving. She worked with physical and occupational therapy recommended that she go to short-term rehabilitation at skilled nursing facility  Chronic pain syndrome AKA narcotic addiction. Pt was actively seeking at SNF  Hypothyroidism TSH was 2.64, she continued her Synthroid  Bipolar I disorder, most recent episode depressed stable. She continued her Xanax, Provigil. Recommended she have her Lexapro dose decreased slightly in taper off if possible under supervising physician.  Pernicious anemia deficiency secondary to gastric bypass. She is followed by hematology as an outpatient and to continue her iron and vitamin B12 supplements. She should follow up with hematology at her already scheduled appointment.   Iron  deficiency anemia deficiency secondary to gastric bypass. She is followed by hematology as an outpatient and to continue her iron and vitamin B12 supplements. She should follow up with hematology at her already scheduled appointment.     Margit HanksALEXANDER, Mitchell Iwanicki D, MD

## 2013-11-08 ENCOUNTER — Other Ambulatory Visit: Payer: Self-pay | Admitting: *Deleted

## 2013-11-08 ENCOUNTER — Non-Acute Institutional Stay (SKILLED_NURSING_FACILITY): Payer: Medicare Other | Admitting: Nurse Practitioner

## 2013-11-08 DIAGNOSIS — G894 Chronic pain syndrome: Secondary | ICD-10-CM

## 2013-11-08 DIAGNOSIS — I959 Hypotension, unspecified: Secondary | ICD-10-CM

## 2013-11-08 DIAGNOSIS — E871 Hypo-osmolality and hyponatremia: Secondary | ICD-10-CM

## 2013-11-08 DIAGNOSIS — F418 Other specified anxiety disorders: Secondary | ICD-10-CM

## 2013-11-08 DIAGNOSIS — W1809XD Striking against other object with subsequent fall, subsequent encounter: Secondary | ICD-10-CM

## 2013-11-08 DIAGNOSIS — E039 Hypothyroidism, unspecified: Secondary | ICD-10-CM

## 2013-11-08 DIAGNOSIS — E538 Deficiency of other specified B group vitamins: Secondary | ICD-10-CM

## 2013-11-08 DIAGNOSIS — D509 Iron deficiency anemia, unspecified: Secondary | ICD-10-CM

## 2013-11-08 MED ORDER — ALPRAZOLAM 2 MG PO TABS: ORAL_TABLET | ORAL | Status: DC

## 2013-11-08 NOTE — Telephone Encounter (Signed)
[facility] 

## 2013-11-08 NOTE — Progress Notes (Signed)
Patient ID: Valerie Silva, female   DOB: 01-Jul-1960, 53 y.o.   MRN: [id number]    Nursing Home Location:  [place] of Service: SN[facility] (31)  PCP: Vidal Schwalbe, MD  Allergies  Allergen Reactions  . Iodine Other (See Comments)    Sensitive when used gynecologically (intra-vaginally).  . Iron Itching    Iron taken by mouth causes this reaction. The patient has had several iron infusions plus Feraheme that has not caused any side effects.   . Lovaza [Omega-3-Acid Ethyl Esters] Itching  . Soma [Carisoprodol] Other (See Comments)    Pt states that this medication causes panic attacks.   . Sulfonamide Derivatives Other (See Comments)    Sensitive when used intra-vaginally.  . Benazepril Hcl Other (See Comments)    Reaction:  Severe cough   . Codeine Other (See Comments)    Pt states that this medication causes rapid heartbeat.   Marland Kitchen Penicillins Other (See Comments)    Reaction: Severe family allergy.  Family physician long ago stated she shouldn't take this med since mother and brother nearly died from anaphylaxis.  Marland Kitchen Zolpidem Tartrate Itching and Other (See Comments)    Pt states that this medication causes blackouts.   . Amitriptyline Other (See Comments)    Pt states that this medication causes sleep walking.     Chief Complaint  Patient presents with  . Discharge Note    HPI:  Patient is a 53 y.o. female seen today at [facility], being seen today for discharge home. She has a pmh of  bipolar disorder, extreme anxiety, 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 Pt currently at [facility] after hospitalization for fall and hyponatremia and is now wanting to go home despite not being finsihed with therapy. She feels like she can regain strenth at home and has the assistance of her son and his girlfriend who is a CNA between jobs at the current time.  She was hospitalized from 9/25 through 9/29 with severe hyponatremia that was felt to be multifactorial secondary to SIADH and diuretics. She has had significant free water intake. She presented after she got tripped up by her dogs leash and fell on the concrete ground. She complained of left and right knee pain, right hand pain, generalized weakness. Her labs were notable for a sodium of 122. X-rays and CT of the head and neck were all unremarkable.  They felt that she had hyponatremia secondary to SSRI, SIADH, and hypovolemia, and low-salt intake. Her sodium gradually events within normal limits with IV fluids and a brief period of sodium chloride tabs. Sodium chloride tabs have been discontinued. Pt reports she already has follow up with PCP on Monday to follow up on all hospital issue and follow up with pain management at well. Son will assist her to these appts.    Review of Systems:  Review of Systems  Constitutional: Negative for activity change, appetite change, fatigue and unexpected weight change.  HENT: Negative for congestion and hearing loss.   Eyes: Negative.   Respiratory: Negative for cough and shortness of breath.   Cardiovascular: Negative for chest pain, palpitations and leg swelling.  Gastrointestinal: Negative for abdominal pain, diarrhea and constipation.  Genitourinary: Negative for dysuria and difficulty urinating.  Musculoskeletal: Positive for arthralgias, back pain and myalgias.       Chronic pain managed by pain clinic, worsening right knee pain after fall  Skin: Negative for color change and wound.  Neurological: Negative for dizziness and weakness.  Psychiatric/Behavioral: The patient is nervous/anxious.     Past Medical History  Diagnosis Date  . Osteoporosis   . Hypothyroidism   . Diabetes mellitus   . Exogenous obesity   . Rhinitis   . Arthritis   . Fibromyalgia   . Somnolence   . Sleep apnea   . Anemia, iron deficiency 04/06/2011  . Pernicious anemia  04/06/2011  . Depression   . Anxiety   . Iron deficiency anemia, unspecified 12/12/2012   Past Surgical History  Procedure Laterality Date  . Gastric bypass    . Cholecystectomy    . Tracheostomy    . Uvulectomy    . Tonsillectomy    . Mandible reconstruction    . Spinal fusion    . Dilation and curettage of uterus    . Cesarean section     Social History:   reports that she has never smoked. She has never used smokeless tobacco. She reports that she drinks alcohol. She reports that she does not use illicit drugs.  Family History  Problem Relation Age of Onset  . CAD Father   . Diabetes Father   . CAD Brother      X3  . Hypertension Brother   . Colon cancer Brother     Medications: Patient's Medications  New Prescriptions   No medications on file  Previous Medications   ACARBOSE (PRECOSE) 25 MG TABLET    Take 25 mg by mouth daily as needed (for low blood sugar).    ALPRAZOLAM (XANAX) 2 MG TABLET    Take two tablets by mouth twice daily as needed for anxiety; Take two tablets by mouth at bedtime as needed for sleep or anxiety   CHOLECALCIFEROL (VITAMIN D-3) 5000 UNITS TABS    Take 2 tablets by mouth daily.   CYANOCOBALAMIN (VITAMIN B-12 IJ)    Inject as directed every 28 (twenty-eight) days.   CYCLOBENZAPRINE (FLEXERIL) 10 MG TABLET    Take 1 tablet (10 mg total) by mouth 3 (three) times daily.   DICLOFENAC (VOLTAREN) 75 MG EC TABLET    Take 75 mg by mouth 2 (two) times daily.    DOCUSATE SODIUM 100 MG CAPS    Take 100 mg by mouth 2 (two) times daily.   ESCITALOPRAM (LEXAPRO) 20 MG TABLET    Take 1 tablet (20 mg total) by mouth daily. For depression and anxiety.   IBUPROFEN (ADVIL,MOTRIN) 800 MG TABLET    Take 800 mg by mouth every 6 (six) hours as needed for pain.   LEVOTHYROXINE (SYNTHROID, LEVOTHROID) 112 MCG TABLET    Take 112-224 mcg by mouth daily before breakfast. Pt takes 287mg every day, but on Monday she takes 112 mcg.   LORATADINE (CLARITIN) 10 MG TABLET    Take  1 tablet (10 mg total) by mouth 2 (two) times daily.   MODAFINIL (PROVIGIL) 200 MG TABLET    Take 2 tablets (400 mg total) by mouth every morning.   OMEPRAZOLE (PRILOSEC) 40 MG CAPSULE    Take 1 capsule (40 mg total) by mouth daily.   OXYCODONE HCL ER (OXYCONTIN) 30 MG T12A    Take 30 mg by mouth 3 (three) times daily.   OXYCODONE HCL ER 30 MG T12A    Take 30 mg by mouth 3 (three) times daily.   OXYCODONE-ACETAMINOPHEN (PERCOCET) 10-325 MG PER TABLET    Take 1 tablet by mouth every 4 (four)  hours as needed for pain (DO NOT EXCEED 3GM APAP/24 HR).   PROMETHAZINE (PHENERGAN) 25 MG TABLET    Take 25 mg by mouth every 8 (eight) hours as needed for nausea.    SUMATRIPTAN (IMITREX) 100 MG TABLET    Take 100 mg by mouth every 2 (two) hours as needed for migraine.    TOPIRAMATE (TOPAMAX) 200 MG TABLET    Take 1 tablet (200 mg total) by mouth at bedtime.   VALACYCLOVIR (VALTREX) 1000 MG TABLET    Take 1,000 mg by mouth daily as needed (for flare ups).   Modified Medications   No medications on file  Discontinued Medications   No medications on file     Physical Exam: Filed Vitals:   11/08/13 1509  BP: 130/63  Pulse: 52  Temp: 98 F (36.7 C)  Resp: 18    Physical Exam  Constitutional: She is oriented to person, place, and time. She appears well-developed and well-nourished. No distress.  HENT:  Head: Normocephalic and atraumatic.  Mouth/Throat: Oropharynx is clear and moist. No oropharyngeal exudate.  Eyes: Conjunctivae are normal. Pupils are equal, round, and reactive to light.  Neck: Normal range of motion. Neck supple.  Cardiovascular: Normal rate, regular rhythm and normal heart sounds.   Pulmonary/Chest: Effort normal and breath sounds normal.  Abdominal: Soft. Bowel sounds are normal.  Musculoskeletal: She exhibits tenderness (wtih limited ROM to left knee after fall, however she is able to walk ). She exhibits no edema.  Neurological: She is alert and oriented to person, place, and  time.  Skin: Skin is warm and dry. Bruising (from fall, improving) noted. She is not diaphoretic.  Psychiatric: She has a normal mood and affect.    Labs reviewed: Basic Metabolic Panel:  Recent Labs  10/31/13 1352  11/02/13 0555 11/03/13 0459 11/04/13 0410  NA  --   < > 132* 137 137  K  --   < > 4.0 4.3 4.4  CL  --   < > 103 106 107  CO2  --   < > _0 GLUCOSE  --   < > 84 84 79  BUN  --   < > _1 CREATININE  --   < > 0.85 1.08 1.15*  CALCIUM  --   < > 7.7* 8.2* 8.0*  MG 1.8  --   --   --   --   < > = values in this interval not displayed. Liver Function Tests:  Recent Labs  03/06/13 1619 07/01/13 1313 10/04/13 1336  AST 44* 26 14  ALT 36 18 19  ALKPHOS 105 80 83  BILITOT 0.50 0.50 0.2*  PROT 6.6 6.2* 7.1  ALBUMIN 3.4*  --  3.8   No results found for this basename: LIPASE, AMYLASE,  in the last 8760 hours No results found for this basename: AMMONIA,  in the last 8760 hours CBC:  Recent Labs  09/30/13 1326  10/11/13 0518 10/31/13 1356  11/02/13 0555 11/03/13 0459 11/04/13 0410  WBC 3.8*  < > 10.2 5.0  < > 3.3* 3.5* 4.1  NEUTROABS 2.1  --  7.4 3.5  --   --   --   --   HGB 10.3*  < > 9.9* 10.7*  < > 9.8* 10.0* 9.2*  HCT 32.1*  < > 30.0* 30.8*  < > 29.1* 29.7* 27.9*  MCV 97  < > 96.5 90.1  < > 93.9 94.3 95.2  PLT 197  < >  191 220  < > 176 206 191  < > = values in this interval not displayed. TSH:  Recent Labs  07/01/13 1313 10/04/13 1554 10/31/13 1844  TSH 0.096* 0.202* 3.740   A1C: Lab Results  Component Value Date   HGBA1C 5.0 10/04/2013   Lipid Panel: No results found for this basename: CHOL, HDL, LDLCALC, TRIG, CHOLHDL, LDLDIRECT,  in the last 8760 hours  Radiological Exams: X-ray Chest Pa And Lateral  10/31/2013   CLINICAL DATA:  53 year old female status post fall today outside, Face hematoma. Discovered to have critical abnormal sodium levels. Initial encounter.  EXAM: CHEST  2 VIEW  COMPARISON:  Chest radiographs 10/11/2013  and earlier.  FINDINGS: Upright AP and lateral views of the chest. Stable and normal lung volumes. Normal cardiac size and mediastinal contours. Visualized tracheal air column is within normal limits. No pneumothorax, pulmonary edema, pleural effusion or confluent pulmonary opacity. Cervical ACDF hardware partially visible. Augmented upper lumbar compression fractures partially visible. No acute osseous abnormality identified. Stable ventral abdominal surgical clips.  IMPRESSION: No acute cardiopulmonary abnormality or acute traumatic injury identified.   Electronically Signed   By: Lars Pinks M.D.   On: 10/31/2013 21:49   Dg Wrist Complete Right  10/31/2013   CLINICAL DATA:  Generalized wrist pain after falling today. Initial encounter.  EXAM: RIGHT WRIST - COMPLETE 3+ VIEW  COMPARISON:  None.  FINDINGS: The bones appear mildly demineralized. There is no evidence of acute fracture or dislocation. The joint spaces are maintained. There is no evidence of focal soft tissue swelling.  IMPRESSION: No acute osseous findings.   Electronically Signed   By: Camie Patience M.D.   On: 10/31/2013 20:03   Dg Femur Left  10/31/2013   CLINICAL DATA:  Pt's dog pulled her down while she was holding leash today.Pt fell on concrete.Left mid femur pain. Left mid tib/fib pain. Some bruising at mid tib/fib.  EXAM: LEFT FEMUR - 2 VIEW  COMPARISON:  None.  FINDINGS: There is no evidence of fracture or other focal bone lesions. Soft tissues are unremarkable.  IMPRESSION: No acute osseous injury of the left femur.   Electronically Signed   By: Kathreen Devoid   On: 10/31/2013 12:40   Dg Tibia/fibula Left  10/31/2013   CLINICAL DATA:  Fall.  EXAM: LEFT TIBIA AND FIBULA - 2 VIEW  COMPARISON:  None.  FINDINGS: There is no evidence of fracture or other focal bone lesions. Soft tissues are unremarkable.  IMPRESSION: Normal left tibia and fibula.   Electronically Signed   By: Sabino Dick M.D.   On: 10/31/2013 12:41   Ct Head Wo  Contrast  10/31/2013   CLINICAL DATA:  Status post fall today. Hematoma right forehead. Initial encounter.  EXAM: CT HEAD WITHOUT CONTRAST  TECHNIQUE: Contiguous axial images were obtained from the base of the skull through the vertex without intravenous contrast.  COMPARISON:  Head CT scan 10/11/2013 and 05/09/2013 be  FINDINGS: Hematoma is seen over the right frontal bone. There is no underlying fracture. The brain appears normal without hemorrhage, infarct, mass lesion, mass effect, midline shift or abnormal extra-axial fluid collection. No hydrocephalus or pneumocephalus. Imaged paranasal sinuses and mastoid air cells are clear.  IMPRESSION: Hematoma over the right frontal bone without underlying fracture or intracranial abnormality.   Electronically Signed   By: Inge Rise M.D.   On: 10/31/2013 12:21    Assessment/Plan Severe hyperosmolar hyponatremia Corrected in hospital, has follow up with PCP in 3 days  to follow up labs should continue to have a regular diet with a 1.2 L fluid restriction.  Hypotension improved  Fall with extensive bruising of the face Pt had pain of the right wrist, bilateral knee pain. X-rays of the right wrist and left knee were negitive for acute fracture Continues with right wrist splint due tosnuffbox tenderness To follow up with orthopedic surgeon in approximately one week to have repeat x-rays.  In the hospital she worked with physical and occupational therapy recommended that she go to short-term rehabilitation at skilled nursing facility now that she is here she feels like she is not benefiting as much as she originally thought. She has not met her goals per therapy but she does have assistance at home and agrees to do therapy per home health with on-going outpt follow up  Chronic pain To continued her home OxyContin and Percocet. She continued diclofenac.  Has followup with her orthopedic physician who is managing her chronic pain scheduled for 3  days Reports she does not need Rx   Hypothyroidism  TSH was 2.64,conts on home dose of  Synthroid.   Bipolar disorder/depression/anxiety Unchanged will cont on home regimen per hosp it is recommended she have her Lexapro dose decreased slightly in taper off if possible under her PCP  Iron deficiency anemia B12 deficiency  cont to follow with hematology as an outpatient and to continue her iron and vitamin B12 supplements.  follow up with hematology at her already scheduled appointment.   Migraine headaches Without complaints on her home medications.    pt is stable for discharge-will need PT/OT per home health. DME needed includes raised toilet seat and platform walker. NO Rx written per pt request.  will need to follow up with PCP in 3 days.

## 2013-11-11 ENCOUNTER — Other Ambulatory Visit: Payer: Self-pay | Admitting: *Deleted

## 2013-11-11 ENCOUNTER — Encounter: Payer: Self-pay | Admitting: Internal Medicine

## 2013-11-11 DIAGNOSIS — G894 Chronic pain syndrome: Secondary | ICD-10-CM | POA: Insufficient documentation

## 2013-11-11 DIAGNOSIS — F319 Bipolar disorder, unspecified: Secondary | ICD-10-CM | POA: Insufficient documentation

## 2013-11-11 DIAGNOSIS — Y92009 Unspecified place in unspecified non-institutional (private) residence as the place of occurrence of the external cause: Secondary | ICD-10-CM

## 2013-11-11 DIAGNOSIS — W19XXXA Unspecified fall, initial encounter: Secondary | ICD-10-CM | POA: Insufficient documentation

## 2013-11-11 DIAGNOSIS — F418 Other specified anxiety disorders: Secondary | ICD-10-CM

## 2013-11-11 MED ORDER — ALPRAZOLAM 2 MG PO TABS: ORAL_TABLET | ORAL | Status: DC

## 2013-11-11 NOTE — Assessment & Plan Note (Signed)
,   pain of the right wrist, bilateral knee pain. X-rays of the right wrist and left knee were unremarkable. She had some snuffbox tenderness in the right wrist for which she had a splint placed. She should follow up with her orthopedic surgeon in approximately one week to have repeat x-rays. She had a brace placed to the left knee for comfort only. Her bruising seems to be improving. She worked with physical and occupational therapy recommended that she go to short-term rehabilitation at skilled nursing facility

## 2013-11-11 NOTE — Assessment & Plan Note (Signed)
stable. She continued her Xanax, Provigil. Recommended she have her Lexapro dose decreased slightly in taper off if possible under supervising physician.

## 2013-11-11 NOTE — Assessment & Plan Note (Signed)
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 She is followed by hematology as an outpatient and to continue her iron and vitamin B12 supplements. She should follow up with hematology at her already scheduled appointment.  

## 2013-11-11 NOTE — Assessment & Plan Note (Addendum)
likely a combination of dehydration, excess free water intake, Lexapro. Initially there was concern that she had adrenal insufficiency because of previously abnormal cosyntropin stem test, intermittent use of steroids and hypertension. She was given IV fluids and she underwent a repeat cosyntropin stim test, the results of which were normal. Her TSH was 3.64. She did not have evidence of cirrhosis or heart failure on exam. Her chest x-ray was unremarkable. She was seen by nephrology because she had a mixed picture with her low serum osmolality, urine +267, but serum sodium of 59. They felt that she had hyponatremia secondary to SSRI, SIADH, and hypovolemia, and low-salt intake. Her sodium gradually events within normal limits with IV fluids and a brief period of sodium chloride tabs. Sodium chloride tabs have not been discontinued. She will need a repeat BMP in one to 2 days to ensure that her sodium levels remained normal. She should continue to have a regular diet with a 1.2 L fluid restriction. If possible, recommend that her Lexapro dose be gradually tapered and for transition to another antidepressant/antianxiety medication Will continue to be an issue for her- water is another addiction

## 2013-11-11 NOTE — Assessment & Plan Note (Signed)
TSH was 2.64, she continued her Synthroid

## 2013-11-11 NOTE — Assessment & Plan Note (Signed)
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 She is followed by hematology as an outpatient and to continue her iron and vitamin B12 supplements. She should follow up with hematology at her already scheduled appointment.

## 2013-11-11 NOTE — Assessment & Plan Note (Signed)
AKA narcotic addiction. [facility]

## 2013-11-11 NOTE — Assessment & Plan Note (Signed)
was likely secondary to medication side effects and dehydration. Her cosyntropin stem test was within normal limits. She had no evidence of hemorrhage or sepsis. Her blood pressure improved with IV fluids, however it remains in the low 100s and low 90s systolic. She was asymptomatic.

## 2013-11-11 NOTE — Assessment & Plan Note (Signed)
likely a combination of dehydration, excess free water intake, Lexapro. Initially there was concern that she had adrenal insufficiency because of previously abnormal cosyntropin stem test, intermittent use of steroids and hypertension. She was given IV fluids and she underwent a repeat cosyntropin stim test, the results of which were normal. Her TSH was 3.64. She did not have evidence of cirrhosis or heart failure on exam. Her chest x-ray was unremarkable. She was seen by nephrology because she had a mixed picture with her low serum osmolality, urine +267, but serum sodium of 59. They felt that she had hyponatremia secondary to SSRI, SIADH, and hypovolemia, and low-salt intake. Her sodium gradually events within normal limits with IV fluids and a brief period of sodium chloride tabs. Sodium chloride tabs have not been discontinued. She will need a repeat BMP in one to 2 days to ensure that her sodium levels remained normal. She should continue to have a regular diet with a 1.2 L fluid restriction. If possible, recommend that her Lexapro dose be gradually tapered and for transition to another antidepressant/antianxiety medication

## 2013-12-23 ENCOUNTER — Ambulatory Visit: Payer: Medicare Other | Admitting: Hematology & Oncology

## 2013-12-23 ENCOUNTER — Telehealth: Payer: Self-pay | Admitting: Hematology & Oncology

## 2013-12-23 ENCOUNTER — Other Ambulatory Visit: Payer: Medicare Other | Admitting: Lab

## 2013-12-23 ENCOUNTER — Ambulatory Visit: Payer: Medicare Other
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
## 2014-01-09 ENCOUNTER — Telehealth: Payer: Self-pay | Admitting: Hematology & Oncology

## 2014-01-09 NOTE — Telephone Encounter (Signed)
Pt called left message wants to see MD. I left pt message to call for appointment

## 2014-01-15 ENCOUNTER — Other Ambulatory Visit (HOSPITAL_BASED_OUTPATIENT_CLINIC_OR_DEPARTMENT_OTHER): Payer: Medicare Other | Admitting: Lab

## 2014-01-15 ENCOUNTER — Encounter: Payer: Self-pay | Admitting: Hematology & Oncology

## 2014-01-15 ENCOUNTER — Ambulatory Visit (HOSPITAL_BASED_OUTPATIENT_CLINIC_OR_DEPARTMENT_OTHER): Payer: Medicare Other | Admitting: Hematology & Oncology

## 2014-01-15 ENCOUNTER — Ambulatory Visit (HOSPITAL_BASED_OUTPATIENT_CLINIC_OR_DEPARTMENT_OTHER): Payer: Medicare Other

## 2014-01-15 VITALS — BP 107/60 | HR 79 | Temp 98.1°F | Resp 16 | Ht 69.0 in | Wt 206.0 lb

## 2014-01-15 DIAGNOSIS — D510 Vitamin B12 deficiency anemia due to intrinsic factor deficiency: Secondary | ICD-10-CM

## 2014-01-15 DIAGNOSIS — D500 Iron deficiency anemia secondary to blood loss (chronic): Secondary | ICD-10-CM

## 2014-01-15 DIAGNOSIS — D509 Iron deficiency anemia, unspecified: Secondary | ICD-10-CM

## 2014-01-15 DIAGNOSIS — F418 Other specified anxiety disorders: Secondary | ICD-10-CM

## 2014-01-15 HISTORY — DX: Iron deficiency anemia secondary to blood loss (chronic): D50.0

## 2014-01-15 LAB — CBC WITH DIFFERENTIAL (CANCER CENTER ONLY)
BASO#: 0 10e3/uL (ref 0.0–0.2)
BASO%: 0.3 % (ref 0.0–2.0)
EOS%: 1.5 % (ref 0.0–7.0)
Eosinophils Absolute: 0.1 10e3/uL (ref 0.0–0.5)
HCT: 35.7 % (ref 34.8–46.6)
HGB: 12 g/dL (ref 11.6–15.9)
LYMPH#: 1.2 10e3/uL (ref 0.9–3.3)
LYMPH%: 29.7 % (ref 14.0–48.0)
MCH: 31.1 pg (ref 26.0–34.0)
MCHC: 33.6 g/dL (ref 32.0–36.0)
MCV: 93 fL (ref 81–101)
MONO#: 0.3 10e3/uL (ref 0.1–0.9)
MONO%: 6.9 % (ref 0.0–13.0)
NEUT#: 2.4 10e3/uL (ref 1.5–6.5)
NEUT%: 61.6 % (ref 39.6–80.0)
Platelets: 228 10e3/uL (ref 145–400)
RBC: 3.86 10e6/uL (ref 3.70–5.32)
RDW: 12 % (ref 11.1–15.7)
WBC: 3.9 10e3/uL (ref 3.9–10.0)

## 2014-01-15 LAB — CMP (CANCER CENTER ONLY)
ALT(SGPT): 17 U/L (ref 10–47)
AST: 21 U/L (ref 11–38)
Albumin: 3.8 g/dL (ref 3.3–5.5)
Alkaline Phosphatase: 63 U/L (ref 26–84)
BUN: 24 mg/dL — AB (ref 7–22)
CALCIUM: 9 mg/dL (ref 8.0–10.3)
CHLORIDE: 94 meq/L — AB (ref 98–108)
CO2: 25 meq/L (ref 18–33)
Creat: 0.9 mg/dl (ref 0.6–1.2)
Glucose, Bld: 89 mg/dL (ref 73–118)
POTASSIUM: 3 meq/L — AB (ref 3.3–4.7)
Sodium: 130 mEq/L (ref 128–145)
TOTAL PROTEIN: 7.2 g/dL (ref 6.4–8.1)
Total Bilirubin: 0.6 mg/dl (ref 0.20–1.60)

## 2014-01-15 MED ORDER — CYANOCOBALAMIN 1000 MCG/ML IJ SOLN
1000.0000 ug | Freq: Once | INTRAMUSCULAR | Status: AC
  Administered 2014-01-15: 1000 ug via INTRAMUSCULAR

## 2014-01-15 MED ORDER — CYANOCOBALAMIN 1000 MCG/ML IJ SOLN
INTRAMUSCULAR | Status: AC
  Filled 2014-01-15: qty 1
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
## 2014-01-15 NOTE — Patient Instructions (Signed)

## 2014-01-16 ENCOUNTER — Telehealth: Payer: Self-pay | Admitting: Hematology & Oncology

## 2014-01-16 ENCOUNTER — Ambulatory Visit: Payer: Self-pay | Admitting: Neurology

## 2014-01-16 LAB — IRON AND TIBC CHCC
%SAT: 48 % (ref 21–57)
Iron: 125 ug/dL (ref 41–142)
TIBC: 261 ug/dL (ref 236–444)
UIBC: 137 ug/dL (ref 120–384)

## 2014-01-16 LAB — FERRITIN CHCC: Ferritin: 573 ng/ml — ABNORMAL HIGH (ref 9–269)

## 2014-01-16 NOTE — Telephone Encounter (Signed)
Mailed march schedule °

## 2014-01-20 ENCOUNTER — Telehealth: Payer: Self-pay | Admitting: *Deleted

## 2014-01-20 NOTE — Telephone Encounter (Signed)
-----   Message from Josph MachoPeter R Ennever, MD sent at 01/16/2014  5:51 PM EST ----- Call - iron is ok!!  Cindee LamePete

## 2014-02-14 ENCOUNTER — Ambulatory Visit
Admission: RE | Admit: 2014-02-14 | Discharge: 2014-02-14 | Disposition: A | Discharge status: Home / Self Care | Payer: Medicare Other | Source: Ambulatory Visit | Attending: Family Medicine | Admitting: Family Medicine

## 2014-02-14 ENCOUNTER — Other Ambulatory Visit: Payer: Self-pay | Admitting: Family Medicine

## 2014-02-14 DIAGNOSIS — R0781 Pleurodynia: Secondary | ICD-10-CM

## 2014-03-10 ENCOUNTER — Encounter (HOSPITAL_COMMUNITY): Payer: Self-pay | Admitting: *Deleted

## 2014-03-10 ENCOUNTER — Emergency Department (HOSPITAL_COMMUNITY): Payer: Medicare Other

## 2014-03-10 ENCOUNTER — Inpatient Hospital Stay (HOSPITAL_COMMUNITY)
Admission: EM | Admit: 2014-03-10 | Discharge: 2014-03-15 | DRG: 644 | Disposition: A | Discharge status: Home Health | Payer: Medicare Other | Attending: Internal Medicine | Admitting: Internal Medicine

## 2014-03-10 DIAGNOSIS — Z8249 Family history of ischemic heart disease and other diseases of the circulatory system: Secondary | ICD-10-CM

## 2014-03-10 DIAGNOSIS — E222 Syndrome of inappropriate secretion of antidiuretic hormone: Secondary | ICD-10-CM | POA: Diagnosis not present

## 2014-03-10 DIAGNOSIS — D500 Iron deficiency anemia secondary to blood loss (chronic): Secondary | ICD-10-CM | POA: Diagnosis present

## 2014-03-10 DIAGNOSIS — E871 Hypo-osmolality and hyponatremia: Secondary | ICD-10-CM

## 2014-03-10 DIAGNOSIS — K921 Melena: Secondary | ICD-10-CM

## 2014-03-10 DIAGNOSIS — R0609 Other forms of dyspnea: Secondary | ICD-10-CM

## 2014-03-10 DIAGNOSIS — F319 Bipolar disorder, unspecified: Secondary | ICD-10-CM | POA: Diagnosis present

## 2014-03-10 DIAGNOSIS — Z981 Arthrodesis status: Secondary | ICD-10-CM

## 2014-03-10 DIAGNOSIS — G8929 Other chronic pain: Secondary | ICD-10-CM | POA: Diagnosis present

## 2014-03-10 DIAGNOSIS — W19XXXA Unspecified fall, initial encounter: Secondary | ICD-10-CM

## 2014-03-10 DIAGNOSIS — Z9049 Acquired absence of other specified parts of digestive tract: Secondary | ICD-10-CM | POA: Diagnosis present

## 2014-03-10 DIAGNOSIS — D513 Other dietary vitamin B12 deficiency anemia: Secondary | ICD-10-CM | POA: Diagnosis present

## 2014-03-10 DIAGNOSIS — Z9884 Bariatric surgery status: Secondary | ICD-10-CM

## 2014-03-10 DIAGNOSIS — R296 Repeated falls: Secondary | ICD-10-CM

## 2014-03-10 DIAGNOSIS — E039 Hypothyroidism, unspecified: Secondary | ICD-10-CM | POA: Diagnosis present

## 2014-03-10 DIAGNOSIS — M797 Fibromyalgia: Secondary | ICD-10-CM | POA: Diagnosis present

## 2014-03-10 DIAGNOSIS — M7989 Other specified soft tissue disorders: Secondary | ICD-10-CM

## 2014-03-10 DIAGNOSIS — E860 Dehydration: Secondary | ICD-10-CM | POA: Diagnosis present

## 2014-03-10 DIAGNOSIS — F418 Other specified anxiety disorders: Secondary | ICD-10-CM | POA: Diagnosis present

## 2014-03-10 DIAGNOSIS — Y92009 Unspecified place in unspecified non-institutional (private) residence as the place of occurrence of the external cause: Secondary | ICD-10-CM

## 2014-03-10 DIAGNOSIS — I440 Atrioventricular block, first degree: Secondary | ICD-10-CM | POA: Diagnosis present

## 2014-03-10 DIAGNOSIS — R55 Syncope and collapse: Secondary | ICD-10-CM

## 2014-03-10 DIAGNOSIS — E119 Type 2 diabetes mellitus without complications: Secondary | ICD-10-CM | POA: Diagnosis present

## 2014-03-10 DIAGNOSIS — M810 Age-related osteoporosis without current pathological fracture: Secondary | ICD-10-CM | POA: Diagnosis present

## 2014-03-10 DIAGNOSIS — W19XXXD Unspecified fall, subsequent encounter: Secondary | ICD-10-CM

## 2014-03-10 DIAGNOSIS — Z79899 Other long term (current) drug therapy: Secondary | ICD-10-CM

## 2014-03-10 DIAGNOSIS — R531 Weakness: Secondary | ICD-10-CM | POA: Diagnosis not present

## 2014-03-10 DIAGNOSIS — Z9181 History of falling: Secondary | ICD-10-CM

## 2014-03-10 LAB — URINALYSIS, ROUTINE W REFLEX MICROSCOPIC
BILIRUBIN URINE: NEGATIVE
Glucose, UA: NEGATIVE mg/dL
Hgb urine dipstick: NEGATIVE
KETONES UR: NEGATIVE mg/dL
Leukocytes, UA: NEGATIVE
NITRITE: NEGATIVE
PH: 5.5 (ref 5.0–8.0)
Protein, ur: NEGATIVE mg/dL
Specific Gravity, Urine: 1.01 (ref 1.005–1.030)
UROBILINOGEN UA: 0.2 mg/dL (ref 0.0–1.0)

## 2014-03-10 LAB — BASIC METABOLIC PANEL
Anion gap: 8 (ref 5–15)
BUN: 12 mg/dL (ref 6–23)
CALCIUM: 8.1 mg/dL — AB (ref 8.4–10.5)
CO2: 25 mmol/L (ref 19–32)
CREATININE: 0.65 mg/dL (ref 0.50–1.10)
Chloride: 81 mmol/L — ABNORMAL LOW (ref 96–112)
GFR calc non Af Amer: >90 mL/min (ref >90)
Glucose, Bld: 98 mg/dL (ref 70–99)
Potassium: 3.6 mmol/L (ref 3.5–5.1)
Sodium: 114 mmol/L — CL (ref 135–145)

## 2014-03-10 LAB — CBC
HEMATOCRIT: 31.2 % — AB (ref 36.0–46.0)
Hemoglobin: 10.8 g/dL — ABNORMAL LOW (ref 12.0–15.0)
MCH: 31.1 pg (ref 26.0–34.0)
MCHC: 34.6 g/dL (ref 30.0–36.0)
MCV: 89.9 fL (ref 78.0–100.0)
PLATELETS: 236 10*3/uL (ref 150–400)
RBC: 3.47 MIL/uL — ABNORMAL LOW (ref 3.87–5.11)
RDW: 12.1 % (ref 11.5–15.5)
WBC: 4.5 10*3/uL (ref 4.0–10.5)

## 2014-03-10 MED ORDER — SODIUM CHLORIDE 0.9 % IV BOLUS (SEPSIS)
1000.0000 mL | Freq: Once | INTRAVENOUS | Status: AC
  Administered 2014-03-11: 1000 mL via INTRAVENOUS

## 2014-03-10 NOTE — ED Provider Notes (Signed)
CSN: [id number]     Arrival date & time 03/10/14  2014 History   First MD Initiated Contact with Patient 03/10/14 2255     Chief Complaint  Patient presents with  . Weakness  . Fall     (Consider location/radiation/quality/duration/timing/severity/associated sxs/prior Treatment) HPI  Valerie Silva is a 54 y.o. female with past medical history of hypothyroidism, diabetes, hyponatremia presenting today with frequent falls. She states she's had increasing weakness for the past 5 months, with falling and losing consciousness at times.  She's currently complaining of worsening pain in her bilateral feet. She has had recent admissions for this due to hyponatremia, she states this is a possibility again. She feels cramping in the muscles in her arms and legs. She denies any liver disease, she has no chest pain or shortness of breath. Patient has no further complaints.  10 Systems reviewed and are negative for acute change except as noted in the HPI.     Past Medical History  Diagnosis Date  . Osteoporosis   . Hypothyroidism   . Diabetes mellitus   . Exogenous obesity   . Rhinitis   . Arthritis   . Fibromyalgia   . Somnolence   . Sleep apnea   . Anemia, iron deficiency 04/06/2011  . Pernicious anemia 04/06/2011  . Depression   . Anxiety   . Iron deficiency anemia, unspecified 12/12/2012  . Iron deficiency anemia due to chronic blood loss 01/15/2014   Past Surgical History  Procedure Laterality Date  . Gastric bypass    . Cholecystectomy    . Tracheostomy    . Uvulectomy    . Tonsillectomy    . Mandible reconstruction    . Spinal fusion    . Dilation and curettage of uterus    . Cesarean section     Family History  Problem Relation Age of Onset  . CAD Father   . Diabetes Father   . CAD Brother      X3  . Hypertension Brother   . Colon cancer Brother    History  Substance Use Topics  . Smoking status: Never Smoker   . Smokeless tobacco: Never Used     Comment: never  used tobacco  . Alcohol Use: Yes     Comment: one mixed drink twice yearly   OB History    No data available     Review of Systems    Allergies  Iodine; Iron; Lovaza; Soma; Sulfonamide derivatives; Benazepril hcl; Codeine; Penicillins; Zolpidem tartrate; and Amitriptyline  Home Medications   Prior to Admission medications   Medication Sig Start Date End Date Taking? Authorizing Provider  acarbose (PRECOSE) 25 MG tablet Take 25 mg by mouth daily as needed (for low blood sugar).  10/09/12   Historical Provider, MD  alprazolam Prudy Feeler) 2 MG tablet Take two tablets by mouth twice daily as needed for anxiety; Take two tablets by mouth at bedtime as needed for sleep or anxiety 11/11/13   Josph Macho, MD  Cholecalciferol (VITAMIN D-3) 5000 UNITS TABS Take 2 tablets by mouth daily.    Historical Provider, MD  Cyanocobalamin (VITAMIN B-12 IJ) Inject as directed every 28 (twenty-eight) days.    Historical Provider, MD  cyclobenzaprine (FLEXERIL) 10 MG tablet Take 1 tablet (10 mg total) by mouth 3 (three) times daily. 11/03/13   Renae Fickle, MD  docusate sodium 100 MG CAPS Take 100 mg by mouth 2 (two) times daily. Patient taking differently: Take 100 mg by mouth as needed.  11/03/13   Renae Fickle, MD  escitalopram (LEXAPRO) 20 MG tablet Take 1 tablet (20 mg total) by mouth daily. For depression and anxiety. 07/13/12   Fransisca Kaufmann, NP  ibuprofen (ADVIL,MOTRIN) 800 MG tablet Take 800 mg by mouth every 6 (six) hours as needed for pain.    Historical Provider, MD  levothyroxine (SYNTHROID, LEVOTHROID) 112 MCG tablet Take 112-224 mcg by mouth daily before breakfast. Pt takes every day, but on Monday she takes 112 mcg.    Historical Provider, MD  loratadine (CLARITIN) 10 MG tablet Take 1 tablet (10 mg total) by mouth 2 (two) times daily. 07/13/12   Fransisca Kaufmann, NP  modafinil (PROVIGIL) 200 MG tablet Take 2 tablets (400 mg total) by mouth every morning. 11/05/13   Mahima Glade Lloyd, MD   mometasone (NASONEX) 50 MCG/ACT nasal spray Place 2 sprays into the nose daily.    Historical Provider, MD  omeprazole (PRILOSEC) 40 MG capsule Take 1 capsule (40 mg total) by mouth daily. 07/13/12   Fransisca Kaufmann, NP  OxyCODONE HCl ER (OXYCONTIN) 30 MG T12A Take 30 mg by mouth 3 (three) times daily. 11/05/13   Oneal Grout, MD  oxyCODONE-acetaminophen (PERCOCET) 10-325 MG per tablet Take 1 tablet by mouth every 4 (four) hours as needed for pain (DO NOT EXCEED 3GM APAP/24 HR). 11/05/13   Oneal Grout, MD  promethazine (PHENERGAN) 25 MG tablet Take 25 mg by mouth every 8 (eight) hours as needed for nausea.     Historical Provider, MD  SUMAtriptan (IMITREX) 100 MG tablet Take 100 mg by mouth every 2 (two) hours as needed for migraine.  05/01/12   Historical Provider, MD  topiramate (TOPAMAX) 200 MG tablet Take 1 tablet (200 mg total) by mouth at bedtime. 07/13/12   Fransisca Kaufmann, NP  valACYclovir (VALTREX) 1000 MG tablet Take 1,000 mg by mouth daily as needed (for flare ups).  08/16/11   Historical Provider, MD   BP 111/88 mmHg  Pulse 73  Temp(Src) 98 F (36.7 C) (Oral)  Resp 16  SpO2 99% Physical Exam  Constitutional: She is oriented to person, place, and time. She appears well-developed and well-nourished. No distress.  Slow to respond but alert and oriented 3.  HENT:  Head: Normocephalic and atraumatic.  Nose: Nose normal.  Mouth/Throat: Oropharynx is clear and moist. No oropharyngeal exudate.  Eyes: Conjunctivae and EOM are normal. Pupils are equal, round, and reactive to light. No scleral icterus.  Neck: Normal range of motion. Neck supple. No JVD present. No tracheal deviation present. No thyromegaly present.  Cardiovascular: Normal rate, regular rhythm and normal heart sounds.  Exam reveals no gallop and no friction rub.   No murmur heard. Pulmonary/Chest: Effort normal and breath sounds normal. No respiratory distress. She has no wheezes. She exhibits no tenderness.  Abdominal: Soft. Bowel  sounds are normal. She exhibits no distension and no mass. There is no tenderness. There is no rebound and no guarding.  Musculoskeletal: Normal range of motion. She exhibits no edema or tenderness.  Bruising from old falls seen in the right forearm, chin. There is no tenderness to palpation.  Lymphadenopathy:    She has no cervical adenopathy.  Neurological: She is alert and oriented to person, place, and time. No cranial nerve deficit. She exhibits normal muscle tone.  Normal strength and sensationx4 ext, nml cerebellar testing, normal gait  Skin: Skin is warm and dry. No rash noted. She is not diaphoretic. No erythema. No pallor.  Nursing note and vitals reviewed.  ED Course  Procedures (including critical care time) Labs Review Labs Reviewed  CBC - Abnormal; Notable for the following:    RBC 3.47 (*)    Hemoglobin 10.8 (*)    HCT 31.2 (*)    All other components within normal limits  BASIC METABOLIC PANEL - Abnormal; Notable for the following:    Sodium 114 (*)    Chloride 81 (*)    Calcium 8.1 (*)    All other components within normal limits  BASIC METABOLIC PANEL - Abnormal; Notable for the following:    Sodium 119 (*)    Chloride 84 (*)    Calcium 8.3 (*)    All other components within normal limits  CBC WITH DIFFERENTIAL/PLATELET - Abnormal; Notable for the following:    RBC 3.30 (*)    Hemoglobin 10.2 (*)    HCT 29.6 (*)    All other components within normal limits  I-STAT CHEM 8, ED - Abnormal; Notable for the following:    Sodium 118 (*)    Chloride 81 (*)    Calcium, Ion 1.01 (*)    All other components within normal limits  MRSA PCR SCREENING  MAGNESIUM  HEPATIC FUNCTION PANEL  ETHANOL  URINALYSIS, ROUTINE W REFLEX MICROSCOPIC  URIC ACID  LACTIC ACID, PLASMA  BRAIN NATRIURETIC PEPTIDE  URINE RAPID DRUG SCREEN (HOSP PERFORMED)  OSMOLALITY  SODIUM, URINE, RANDOM  OSMOLALITY, URINE  LACTIC ACID, PLASMA  BASIC METABOLIC PANEL  BASIC METABOLIC PANEL   BASIC METABOLIC PANEL  BASIC METABOLIC PANEL  BASIC METABOLIC PANEL  CBC  TSH    Imaging Review Dg Chest 2 View  03/11/2014   CLINICAL DATA:  Acute onset of syncope. Status post multiple falls today. Initial encounter.  EXAM: CHEST  2 VIEW  COMPARISON:  Chest radiograph performed 02/14/2014  FINDINGS: The lungs are hypoexpanded. Mild vascular crowding is noted. No focal consolidation, pleural effusion or pneumothorax is seen.  The cardiomediastinal silhouette is borderline normal in size. No acute osseous abnormalities are identified. Cervical spinal fusion hardware is noted.  IMPRESSION: Lungs hypoexpanded but grossly clear. No displaced rib fracture seen.   Electronically Signed   By: Roanna Raider M.D.   On: 03/11/2014 00:45   Dg Pelvis 1-2 Views  03/11/2014   CLINICAL DATA:  Status post multiple recent falls. Pelvic and coccygeal pain. Initial encounter.  EXAM: PELVIS - 1-2 VIEW  COMPARISON:  Lumbar spine radiographs performed 05/03/2013  FINDINGS: There is no evidence of fracture or dislocation. Both femoral heads are seated normally within their respective acetabula. No significant degenerative change is appreciated. The sacroiliac joints are unremarkable in appearance.  The visualized bowel gas pattern is grossly unremarkable in appearance. Scattered phleboliths are noted within the pelvis. Scattered clips are seen about the pelvis.  IMPRESSION: No evidence of fracture or dislocation.   Electronically Signed   By: Roanna Raider M.D.   On: 03/11/2014 02:31   Ct Head Wo Contrast  03/11/2014   CLINICAL DATA:  Worsening generalized weakness and recurrent falls. Weakness for 5 weeks. Slowed speech. Concern for maxillofacial or cervical spine injury. Initial encounter.  EXAM: CT HEAD WITHOUT CONTRAST  CT MAXILLOFACIAL WITHOUT CONTRAST  CT CERVICAL SPINE WITHOUT CONTRAST  TECHNIQUE: Multidetector CT imaging of the head, cervical spine, and maxillofacial structures were performed using the standard  protocol without intravenous contrast. Multiplanar CT image reconstructions of the cervical spine and maxillofacial structures were also generated.  COMPARISON:  CT of the head performed 10/31/2013, CT of the  cervical spine performed 05/03/2013, and MRI of the brain performed 09/20/2011  FINDINGS: CT HEAD FINDINGS  There is no evidence of acute infarction, mass lesion, or intra- or extra-axial hemorrhage on CT.  The posterior fossa, including the cerebellum, brainstem and fourth ventricle, is within normal limits. The third and lateral ventricles, and basal ganglia are unremarkable in appearance. The cerebral hemispheres are symmetric in appearance, with normal gray-white differentiation. No mass effect or midline shift is seen.  There is no evidence of fracture; visualized osseous structures are unremarkable in appearance. The orbits are within normal limits. The paranasal sinuses and mastoid air cells are well-aerated. No significant soft tissue abnormalities are seen.  CT MAXILLOFACIAL FINDINGS  There is no evidence of fracture or dislocation. The maxilla and mandible appear intact. Postoperative change is noted at the central mandible. The nasal bone is unremarkable in appearance. There is chronic complete absence of the dentition.  The orbits are intact bilaterally. The visualized paranasal sinuses and mastoid air cells are well-aerated.  No significant soft tissue abnormalities are seen. The parapharyngeal fat planes are preserved. The nasopharynx, oropharynx and hypopharynx are unremarkable in appearance. The visualized portions of the valleculae and piriform sinuses are grossly unremarkable.  The parotid and submandibular glands are within normal limits. No cervical lymphadenopathy is seen.  CT CERVICAL SPINE FINDINGS  There is no evidence of fracture or subluxation. The patient is status post anterior cervical spinal fusion at C4-C7. Vertebral bodies demonstrate normal height and alignment. Prevertebral  soft tissues are within normal limits.  The thyroid gland is unremarkable in appearance. Minimal interstitial prominence at the lung apices is likely transient in nature. No significant soft tissue abnormalities are seen.  IMPRESSION: 1. No evidence of traumatic intracranial injury or fracture. 2. No evidence of fracture or dislocation with regard to the maxillofacial structures. 3. No evidence of fracture or subluxation along the cervical spine. 4. Status post anterior cervical spinal fusion at C4-C7.   Electronically Signed   By: Roanna Raider M.D.   On: 03/11/2014 02:59   Ct Cervical Spine Wo Contrast  03/11/2014   CLINICAL DATA:  Worsening generalized weakness and recurrent falls. Weakness for 5 weeks. Slowed speech. Concern for maxillofacial or cervical spine injury. Initial encounter.  EXAM: CT HEAD WITHOUT CONTRAST  CT MAXILLOFACIAL WITHOUT CONTRAST  CT CERVICAL SPINE WITHOUT CONTRAST  TECHNIQUE: Multidetector CT imaging of the head, cervical spine, and maxillofacial structures were performed using the standard protocol without intravenous contrast. Multiplanar CT image reconstructions of the cervical spine and maxillofacial structures were also generated.  COMPARISON:  CT of the head performed 10/31/2013, CT of the cervical spine performed 05/03/2013, and MRI of the brain performed 09/20/2011  FINDINGS: CT HEAD FINDINGS  There is no evidence of acute infarction, mass lesion, or intra- or extra-axial hemorrhage on CT.  The posterior fossa, including the cerebellum, brainstem and fourth ventricle, is within normal limits. The third and lateral ventricles, and basal ganglia are unremarkable in appearance. The cerebral hemispheres are symmetric in appearance, with normal gray-white differentiation. No mass effect or midline shift is seen.  There is no evidence of fracture; visualized osseous structures are unremarkable in appearance. The orbits are within normal limits. The paranasal sinuses and mastoid air  cells are well-aerated. No significant soft tissue abnormalities are seen.  CT MAXILLOFACIAL FINDINGS  There is no evidence of fracture or dislocation. The maxilla and mandible appear intact. Postoperative change is noted at the central mandible. The nasal bone is unremarkable in appearance.  There is chronic complete absence of the dentition.  The orbits are intact bilaterally. The visualized paranasal sinuses and mastoid air cells are well-aerated.  No significant soft tissue abnormalities are seen. The parapharyngeal fat planes are preserved. The nasopharynx, oropharynx and hypopharynx are unremarkable in appearance. The visualized portions of the valleculae and piriform sinuses are grossly unremarkable.  The parotid and submandibular glands are within normal limits. No cervical lymphadenopathy is seen.  CT CERVICAL SPINE FINDINGS  There is no evidence of fracture or subluxation. The patient is status post anterior cervical spinal fusion at C4-C7. Vertebral bodies demonstrate normal height and alignment. Prevertebral soft tissues are within normal limits.  The thyroid gland is unremarkable in appearance. Minimal interstitial prominence at the lung apices is likely transient in nature. No significant soft tissue abnormalities are seen.  IMPRESSION: 1. No evidence of traumatic intracranial injury or fracture. 2. No evidence of fracture or dislocation with regard to the maxillofacial structures. 3. No evidence of fracture or subluxation along the cervical spine. 4. Status post anterior cervical spinal fusion at C4-C7.   Electronically Signed   By: Roanna Raider M.D.   On: 03/11/2014 02:59   Ct Maxillofacial Wo Cm  03/11/2014   CLINICAL DATA:  Worsening generalized weakness and recurrent falls. Weakness for 5 weeks. Slowed speech. Concern for maxillofacial or cervical spine injury. Initial encounter.  EXAM: CT HEAD WITHOUT CONTRAST  CT MAXILLOFACIAL WITHOUT CONTRAST  CT CERVICAL SPINE WITHOUT CONTRAST  TECHNIQUE:  Multidetector CT imaging of the head, cervical spine, and maxillofacial structures were performed using the standard protocol without intravenous contrast. Multiplanar CT image reconstructions of the cervical spine and maxillofacial structures were also generated.  COMPARISON:  CT of the head performed 10/31/2013, CT of the cervical spine performed 05/03/2013, and MRI of the brain performed 09/20/2011  FINDINGS: CT HEAD FINDINGS  There is no evidence of acute infarction, mass lesion, or intra- or extra-axial hemorrhage on CT.  The posterior fossa, including the cerebellum, brainstem and fourth ventricle, is within normal limits. The third and lateral ventricles, and basal ganglia are unremarkable in appearance. The cerebral hemispheres are symmetric in appearance, with normal gray-white differentiation. No mass effect or midline shift is seen.  There is no evidence of fracture; visualized osseous structures are unremarkable in appearance. The orbits are within normal limits. The paranasal sinuses and mastoid air cells are well-aerated. No significant soft tissue abnormalities are seen.  CT MAXILLOFACIAL FINDINGS  There is no evidence of fracture or dislocation. The maxilla and mandible appear intact. Postoperative change is noted at the central mandible. The nasal bone is unremarkable in appearance. There is chronic complete absence of the dentition.  The orbits are intact bilaterally. The visualized paranasal sinuses and mastoid air cells are well-aerated.  No significant soft tissue abnormalities are seen. The parapharyngeal fat planes are preserved. The nasopharynx, oropharynx and hypopharynx are unremarkable in appearance. The visualized portions of the valleculae and piriform sinuses are grossly unremarkable.  The parotid and submandibular glands are within normal limits. No cervical lymphadenopathy is seen.  CT CERVICAL SPINE FINDINGS  There is no evidence of fracture or subluxation. The patient is status post  anterior cervical spinal fusion at C4-C7. Vertebral bodies demonstrate normal height and alignment. Prevertebral soft tissues are within normal limits.  The thyroid gland is unremarkable in appearance. Minimal interstitial prominence at the lung apices is likely transient in nature. No significant soft tissue abnormalities are seen.  IMPRESSION: 1. No evidence of traumatic intracranial injury or  fracture. 2. No evidence of fracture or dislocation with regard to the maxillofacial structures. 3. No evidence of fracture or subluxation along the cervical spine. 4. Status post anterior cervical spinal fusion at C4-C7.   Electronically Signed   By: Roanna RaiderJeffery  Chang M.D.   On: 03/11/2014 02:59     EKG Interpretation   Date/Time:  Monday March 10 2014 23:30:19 EST Ventricular Rate:  70 PR Interval:  252 QRS Duration: 104 QT Interval:  430 QTC Calculation: 464 R Axis:   3 Text Interpretation:  Sinus rhythm Prolonged PR interval RSR' in V1 or V2,  right VCD or RVH Confirmed by Erroll Lunani, Mont Jagoda Ayokunle [phone]) on 03/10/2014  11:42:01 PM      MDM   Final diagnoses:  Syncope   Patient since emergency department for frequent falls. She does have 4 ED visits for the last 6 months for the same, one of which requiring admission for hyponatremia. We'll obtain laboratory studies to evaluate etiology of fall. Neurological exam here is normal, I do not believe CT scan of head is warranted. Patient has no bony tenderness to palpation and bruised areas.  Sodium is 114, likely the cause of the patient's lightheadedness and frequent falls. She has a history of hyponatremia. She was given 1 L IV fluid bolus. Patient be made to [facility] hospitalist, stepdown unit for continued management. Urine electrolytes and osmolality was ordered at their request.  CRITICAL CARE Performed by: Tomasita CrumbleNI,Mariea Mcmartin   Total critical care time: 40min - hyponatremia  Critical care time was exclusive of separately billable procedures and  treating other patients.  Critical care was necessary to treat or prevent imminent or life-threatening deterioration.  Critical care was time spent personally by me on the following activities: development of treatment plan with patient and/or surrogate as well as nursing, discussions with consultants, evaluation of patient's response to treatment, examination of patient, obtaining history from patient or surrogate, ordering and performing treatments and interventions, ordering and review of laboratory studies, ordering and review of radiographic studies, pulse oximetry and re-evaluation of patient's condition.     Tomasita CrumbleAdeleke Zniya Cottone, MD 03/11/14 [id number]

## 2014-03-10 NOTE — ED Notes (Signed)
Per EMS, pt states that she's out of her medications, xanax, she has generalized chronic pain, per EMS she's altered and thinks it's November, she does answer questions and knows her meds. Her pain is chronic, but states she has fallen several times in the last few days.
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
## 2014-03-10 NOTE — ED Notes (Signed)
Patient refuses to change into gown.

## 2014-03-11 ENCOUNTER — Telehealth: Payer: Self-pay | Admitting: Hematology & Oncology

## 2014-03-11 ENCOUNTER — Inpatient Hospital Stay (HOSPITAL_COMMUNITY): Payer: Medicare Other

## 2014-03-11 ENCOUNTER — Encounter (HOSPITAL_COMMUNITY): Payer: Self-pay | Admitting: Internal Medicine

## 2014-03-11 DIAGNOSIS — Z981 Arthrodesis status: Secondary | ICD-10-CM | POA: Diagnosis not present

## 2014-03-11 DIAGNOSIS — M797 Fibromyalgia: Secondary | ICD-10-CM | POA: Diagnosis present

## 2014-03-11 DIAGNOSIS — W19XXXA Unspecified fall, initial encounter: Secondary | ICD-10-CM

## 2014-03-11 DIAGNOSIS — Z9049 Acquired absence of other specified parts of digestive tract: Secondary | ICD-10-CM | POA: Diagnosis present

## 2014-03-11 DIAGNOSIS — I440 Atrioventricular block, first degree: Secondary | ICD-10-CM | POA: Diagnosis present

## 2014-03-11 DIAGNOSIS — Z79899 Other long term (current) drug therapy: Secondary | ICD-10-CM | POA: Diagnosis not present

## 2014-03-11 DIAGNOSIS — E860 Dehydration: Secondary | ICD-10-CM | POA: Diagnosis present

## 2014-03-11 DIAGNOSIS — E222 Syndrome of inappropriate secretion of antidiuretic hormone: Secondary | ICD-10-CM | POA: Diagnosis present

## 2014-03-11 DIAGNOSIS — M810 Age-related osteoporosis without current pathological fracture: Secondary | ICD-10-CM | POA: Diagnosis present

## 2014-03-11 DIAGNOSIS — W19XXXS Unspecified fall, sequela: Secondary | ICD-10-CM | POA: Diagnosis not present

## 2014-03-11 DIAGNOSIS — R296 Repeated falls: Secondary | ICD-10-CM

## 2014-03-11 DIAGNOSIS — E871 Hypo-osmolality and hyponatremia: Secondary | ICD-10-CM

## 2014-03-11 DIAGNOSIS — D500 Iron deficiency anemia secondary to blood loss (chronic): Secondary | ICD-10-CM | POA: Diagnosis present

## 2014-03-11 DIAGNOSIS — E119 Type 2 diabetes mellitus without complications: Secondary | ICD-10-CM | POA: Diagnosis present

## 2014-03-11 DIAGNOSIS — E038 Other specified hypothyroidism: Secondary | ICD-10-CM | POA: Diagnosis not present

## 2014-03-11 DIAGNOSIS — D513 Other dietary vitamin B12 deficiency anemia: Secondary | ICD-10-CM | POA: Diagnosis present

## 2014-03-11 DIAGNOSIS — R0609 Other forms of dyspnea: Secondary | ICD-10-CM | POA: Diagnosis not present

## 2014-03-11 DIAGNOSIS — F418 Other specified anxiety disorders: Secondary | ICD-10-CM | POA: Diagnosis present

## 2014-03-11 DIAGNOSIS — R0600 Dyspnea, unspecified: Secondary | ICD-10-CM | POA: Diagnosis not present

## 2014-03-11 DIAGNOSIS — R531 Weakness: Secondary | ICD-10-CM | POA: Diagnosis present

## 2014-03-11 DIAGNOSIS — E039 Hypothyroidism, unspecified: Secondary | ICD-10-CM

## 2014-03-11 DIAGNOSIS — M7989 Other specified soft tissue disorders: Secondary | ICD-10-CM | POA: Diagnosis not present

## 2014-03-11 DIAGNOSIS — Z9884 Bariatric surgery status: Secondary | ICD-10-CM | POA: Diagnosis not present

## 2014-03-11 DIAGNOSIS — Z9181 History of falling: Secondary | ICD-10-CM | POA: Diagnosis not present

## 2014-03-11 DIAGNOSIS — F319 Bipolar disorder, unspecified: Secondary | ICD-10-CM | POA: Diagnosis present

## 2014-03-11 DIAGNOSIS — G8929 Other chronic pain: Secondary | ICD-10-CM | POA: Diagnosis present

## 2014-03-11 DIAGNOSIS — Z8249 Family history of ischemic heart disease and other diseases of the circulatory system: Secondary | ICD-10-CM | POA: Diagnosis not present

## 2014-03-11 DIAGNOSIS — K921 Melena: Secondary | ICD-10-CM | POA: Diagnosis present

## 2014-03-11 DIAGNOSIS — W19XXXD Unspecified fall, subsequent encounter: Secondary | ICD-10-CM | POA: Diagnosis not present

## 2014-03-11 LAB — BASIC METABOLIC PANEL
ANION GAP: 4 — AB (ref 5–15)
Anion gap: 10 (ref 5–15)
Anion gap: 7 (ref 5–15)
Anion gap: 9 (ref 5–15)
Anion gap: 8 (ref 5–15)
Anion gap: 8 (ref 5–15)
BUN: 10 mg/dL (ref 6–23)
BUN: 9 mg/dL (ref 6–23)
BUN: 8 mg/dL (ref 6–23)
BUN: 7 mg/dL (ref 6–23)
BUN: 6 mg/dL (ref 6–23)
BUN: 6 mg/dL (ref 6–23)
CALCIUM: 8.3 mg/dL — AB (ref 8.4–10.5)
CALCIUM: 8 mg/dL — AB (ref 8.4–10.5)
CALCIUM: 7.7 mg/dL — AB (ref 8.4–10.5)
CALCIUM: 7.7 mg/dL — AB (ref 8.4–10.5)
CO2: 25 mmol/L (ref 19–32)
CO2: 25 mmol/L (ref 19–32)
CO2: 26 mmol/L (ref 19–32)
CO2: 26 mmol/L (ref 19–32)
CO2: 26 mmol/L (ref 19–32)
CO2: 23 mmol/L (ref 19–32)
CREATININE: 0.53 mg/dL (ref 0.50–1.10)
CREATININE: 0.52 mg/dL (ref 0.50–1.10)
Calcium: 8 mg/dL — ABNORMAL LOW (ref 8.4–10.5)
Calcium: 8.1 mg/dL — ABNORMAL LOW (ref 8.4–10.5)
Chloride: 84 mmol/L — ABNORMAL LOW (ref 96–112)
Chloride: 85 mmol/L — ABNORMAL LOW (ref 96–112)
Chloride: 83 mmol/L — ABNORMAL LOW (ref 96–112)
Chloride: 84 mmol/L — ABNORMAL LOW (ref 96–112)
Chloride: 87 mmol/L — ABNORMAL LOW (ref 96–112)
Chloride: 85 mmol/L — ABNORMAL LOW (ref 96–112)
Creatinine, Ser: 0.56 mg/dL (ref 0.50–1.10)
Creatinine, Ser: 0.53 mg/dL (ref 0.50–1.10)
Creatinine, Ser: 0.52 mg/dL (ref 0.50–1.10)
Creatinine, Ser: 0.42 mg/dL — ABNORMAL LOW (ref 0.50–1.10)
GFR calc Af Amer: >90 mL/min (ref >90)
GFR calc Af Amer: >90 mL/min (ref >90)
GFR calc Af Amer: >90 mL/min (ref >90)
GFR calc Af Amer: >90 mL/min (ref >90)
GFR calc non Af Amer: >90 mL/min (ref >90)
GLUCOSE: 88 mg/dL (ref 70–99)
Glucose, Bld: 84 mg/dL (ref 70–99)
Glucose, Bld: 104 mg/dL — ABNORMAL HIGH (ref 70–99)
Glucose, Bld: 93 mg/dL (ref 70–99)
Glucose, Bld: 89 mg/dL (ref 70–99)
Glucose, Bld: 97 mg/dL (ref 70–99)
POTASSIUM: 3.5 mmol/L (ref 3.5–5.1)
POTASSIUM: 3.6 mmol/L (ref 3.5–5.1)
POTASSIUM: 3.4 mmol/L — AB (ref 3.5–5.1)
Potassium: 3.8 mmol/L (ref 3.5–5.1)
Potassium: 3.4 mmol/L — ABNORMAL LOW (ref 3.5–5.1)
Potassium: 3.5 mmol/L (ref 3.5–5.1)
SODIUM: 119 mmol/L — AB (ref 135–145)
SODIUM: 117 mmol/L — AB (ref 135–145)
SODIUM: 117 mmol/L — AB (ref 135–145)
Sodium: 118 mmol/L — CL (ref 135–145)
Sodium: 118 mmol/L — CL (ref 135–145)
Sodium: 116 mmol/L — CL (ref 135–145)

## 2014-03-11 LAB — CBC WITH DIFFERENTIAL/PLATELET
Basophils Absolute: 0 10*3/uL (ref 0.0–0.1)
Basophils Relative: 0 % (ref 0–1)
Eosinophils Absolute: 0.1 10*3/uL (ref 0.0–0.7)
Eosinophils Relative: 2 % (ref 0–5)
HEMATOCRIT: 29.6 % — AB (ref 36.0–46.0)
HEMOGLOBIN: 10.2 g/dL — AB (ref 12.0–15.0)
LYMPHS ABS: 1 10*3/uL (ref 0.7–4.0)
LYMPHS PCT: 24 % (ref 12–46)
MCH: 30.9 pg (ref 26.0–34.0)
MCHC: 34.5 g/dL (ref 30.0–36.0)
MCV: 89.7 fL (ref 78.0–100.0)
MONO ABS: 0.3 10*3/uL (ref 0.1–1.0)
MONOS PCT: 7 % (ref 3–12)
Neutro Abs: 2.8 10*3/uL (ref 1.7–7.7)
Neutrophils Relative %: 67 % (ref 43–77)
Platelets: 213 10*3/uL (ref 150–400)
RBC: 3.3 MIL/uL — AB (ref 3.87–5.11)
RDW: 12.1 % (ref 11.5–15.5)
WBC: 4.2 10*3/uL (ref 4.0–10.5)

## 2014-03-11 LAB — I-STAT CHEM 8, ED
BUN: 9 mg/dL (ref 6–23)
Calcium, Ion: 1.01 mmol/L — ABNORMAL LOW (ref 1.12–1.23)
Chloride: 81 mmol/L — ABNORMAL LOW (ref 96–112)
Creatinine, Ser: 0.7 mg/dL (ref 0.50–1.10)
Glucose, Bld: 82 mg/dL (ref 70–99)
HCT: 42 % (ref 36.0–46.0)
HEMOGLOBIN: 14.3 g/dL (ref 12.0–15.0)
Potassium: 3.8 mmol/L (ref 3.5–5.1)
Sodium: 118 mmol/L — CL (ref 135–145)
TCO2: 20 mmol/L (ref 0–100)

## 2014-03-11 LAB — URIC ACID: URIC ACID, SERUM: 2.7 mg/dL (ref 2.4–7.0)

## 2014-03-11 LAB — HEPATIC FUNCTION PANEL
ALBUMIN: 3.7 g/dL (ref 3.5–5.2)
ALK PHOS: 51 U/L (ref 39–117)
ALT: 25 U/L (ref 0–35)
AST: 26 U/L (ref 0–37)
BILIRUBIN TOTAL: 0.7 mg/dL (ref 0.3–1.2)
Bilirubin, Direct: 0.1 mg/dL (ref 0.0–0.5)
Indirect Bilirubin: 0.6 mg/dL (ref 0.3–0.9)
Total Protein: 6.5 g/dL (ref 6.0–8.3)

## 2014-03-11 LAB — RAPID URINE DRUG SCREEN, HOSP PERFORMED
Amphetamines: NOT DETECTED
Barbiturates: NOT DETECTED
Benzodiazepines: POSITIVE — AB
Cocaine: NOT DETECTED
Opiates: NOT DETECTED
Tetrahydrocannabinol: NOT DETECTED

## 2014-03-11 LAB — MAGNESIUM: Magnesium: 1.6 mg/dL (ref 1.5–2.5)

## 2014-03-11 LAB — LACTIC ACID, PLASMA
Lactic Acid, Venous: 1.2 mmol/L (ref 0.5–2.0)
Lactic Acid, Venous: 1 mmol/L (ref 0.5–2.0)

## 2014-03-11 LAB — MRSA PCR SCREENING: MRSA BY PCR: NEGATIVE

## 2014-03-11 LAB — BRAIN NATRIURETIC PEPTIDE: B Natriuretic Peptide: 58.6 pg/mL (ref 0.0–100.0)

## 2014-03-11 LAB — ETHANOL

## 2014-03-11 LAB — TSH: TSH: 0.661 u[IU]/mL (ref 0.350–4.500)

## 2014-03-11 LAB — SODIUM, URINE, RANDOM: SODIUM UR: 64 meq/L

## 2014-03-11 LAB — OSMOLALITY: Osmolality: 243 mOsm/kg — ABNORMAL LOW (ref 275–300)

## 2014-03-11 LAB — OSMOLALITY, URINE: Osmolality, Ur: 355 mOsm/kg — ABNORMAL LOW (ref 390–1090)

## 2014-03-11 MED ORDER — ONDANSETRON HCL 4 MG PO TABS
4.0000 mg | ORAL_TABLET | Freq: Four times a day (QID) | ORAL | Status: DC | PRN
  Administered 2014-03-12 – 2014-03-14 (×2): 4 mg via ORAL
  Filled 2014-03-11 (×2): qty 1

## 2014-03-11 MED ORDER — ACETAMINOPHEN 325 MG PO TABS: 650.0000 mg | ORAL_TABLET | Freq: Four times a day (QID) | ORAL | Status: DC | PRN

## 2014-03-11 MED ORDER — SODIUM CHLORIDE 0.9 % IV SOLN
INTRAVENOUS | Status: DC
  Administered 2014-03-11 – 2014-03-14 (×6): via INTRAVENOUS

## 2014-03-11 MED ORDER — ENOXAPARIN SODIUM 40 MG/0.4ML ~~LOC~~ SOLN
40.0000 mg | SUBCUTANEOUS | Status: DC
  Administered 2014-03-11 – 2014-03-14 (×4): 40 mg via SUBCUTANEOUS
  Filled 2014-03-11 (×4): qty 0.4

## 2014-03-11 MED ORDER — ALPRAZOLAM 1 MG PO TABS
2.0000 mg | ORAL_TABLET | Freq: Two times a day (BID) | ORAL | Status: DC | PRN
  Administered 2014-03-11 – 2014-03-15 (×7): 2 mg via ORAL
  Filled 2014-03-11 (×7): qty 2

## 2014-03-11 MED ORDER — OXYCODONE HCL 5 MG PO TABS
5.0000 mg | ORAL_TABLET | ORAL | Status: DC | PRN
  Administered 2014-03-11 – 2014-03-15 (×13): 5 mg via ORAL
  Filled 2014-03-11 (×13): qty 1

## 2014-03-11 MED ORDER — TOPIRAMATE 100 MG PO TABS
200.0000 mg | ORAL_TABLET | Freq: Every day | ORAL | Status: DC
  Administered 2014-03-11 – 2014-03-14 (×4): 200 mg via ORAL
  Filled 2014-03-11 (×5): qty 2

## 2014-03-11 MED ORDER — ACETAMINOPHEN 650 MG RE SUPP
650.0000 mg | Freq: Four times a day (QID) | RECTAL | Status: DC | PRN
Start: 2014-03-11 — End: 2014-03-16

## 2014-03-11 MED ORDER — ACARBOSE 25 MG PO TABS
25.0000 mg | ORAL_TABLET | Freq: Every day | ORAL | Status: DC | PRN
  Filled 2014-03-11: qty 1

## 2014-03-11 MED ORDER — PANTOPRAZOLE SODIUM 40 MG PO TBEC
40.0000 mg | DELAYED_RELEASE_TABLET | Freq: Every day | ORAL | Status: DC
Start: 2014-03-11 — End: 2014-03-16
  Administered 2014-03-11 – 2014-03-15 (×5): 40 mg via ORAL
  Filled 2014-03-11 (×5): qty 1

## 2014-03-11 MED ORDER — OXYCODONE-ACETAMINOPHEN 10-325 MG PO TABS
1.0000 | ORAL_TABLET | ORAL | Status: DC | PRN
Start: 2014-03-11 — End: 2014-03-11

## 2014-03-11 MED ORDER — MODAFINIL 200 MG PO TABS
400.0000 mg | ORAL_TABLET | Freq: Every morning | ORAL | Status: DC
  Administered 2014-03-11 – 2014-03-14 (×3): 400 mg via ORAL
  Filled 2014-03-11 (×3): qty 2

## 2014-03-11 MED ORDER — DOCUSATE SODIUM 100 MG PO CAPS: 100.0000 mg | ORAL_CAPSULE | ORAL | Status: DC | PRN

## 2014-03-11 MED ORDER — OXYCODONE-ACETAMINOPHEN 5-325 MG PO TABS
1.0000 | ORAL_TABLET | ORAL | Status: DC | PRN
  Administered 2014-03-11 – 2014-03-15 (×12): 1 via ORAL
  Filled 2014-03-11 (×12): qty 1

## 2014-03-11 MED ORDER — POTASSIUM CHLORIDE CRYS ER 20 MEQ PO TBCR
20.0000 meq | EXTENDED_RELEASE_TABLET | Freq: Once | ORAL | Status: AC
  Administered 2014-03-11: 20 meq via ORAL
  Filled 2014-03-11: qty 1

## 2014-03-11 MED ORDER — SODIUM CHLORIDE 0.9 % IJ SOLN
3.0000 mL | Freq: Two times a day (BID) | INTRAMUSCULAR | Status: DC
  Administered 2014-03-11 (×2): 3 mL via INTRAVENOUS

## 2014-03-11 MED ORDER — BUTALBITAL-APAP-CAFFEINE 50-325-40 MG PO TABS
2.0000 | ORAL_TABLET | Freq: Four times a day (QID) | ORAL | Status: AC | PRN
  Administered 2014-03-12 (×2): 2 via ORAL
  Filled 2014-03-11 (×2): qty 2

## 2014-03-11 MED ORDER — LEVOTHYROXINE SODIUM 112 MCG PO TABS
224.0000 ug | ORAL_TABLET | ORAL | Status: DC
  Administered 2014-03-11 – 2014-03-15 (×5): 224 ug via ORAL
  Filled 2014-03-11 (×7): qty 2

## 2014-03-11 MED ORDER — FLUTICASONE PROPIONATE 50 MCG/ACT NA SUSP
2.0000 | Freq: Every day | NASAL | Status: DC
  Administered 2014-03-11 – 2014-03-15 (×5): 2 via NASAL
  Filled 2014-03-11: qty 16

## 2014-03-11 MED ORDER — OXYCODONE HCL ER 15 MG PO T12A
30.0000 mg | EXTENDED_RELEASE_TABLET | Freq: Three times a day (TID) | ORAL | Status: DC
  Administered 2014-03-11 – 2014-03-15 (×13): 30 mg via ORAL
  Filled 2014-03-11 (×14): qty 2

## 2014-03-11 MED ORDER — LORATADINE 10 MG PO TABS
10.0000 mg | ORAL_TABLET | Freq: Two times a day (BID) | ORAL | Status: DC
  Administered 2014-03-11 – 2014-03-15 (×9): 10 mg via ORAL
  Filled 2014-03-11 (×10): qty 1

## 2014-03-11 MED ORDER — LEVOTHYROXINE SODIUM 112 MCG PO TABS: 112.0000 ug | ORAL_TABLET | ORAL | Status: DC

## 2014-03-11 MED ORDER — CYCLOBENZAPRINE HCL 10 MG PO TABS
10.0000 mg | ORAL_TABLET | Freq: Three times a day (TID) | ORAL | Status: DC
  Administered 2014-03-11 – 2014-03-15 (×13): 10 mg via ORAL
  Filled 2014-03-11 (×13): qty 1

## 2014-03-11 MED ORDER — ONDANSETRON HCL 4 MG/2ML IJ SOLN: 4.0000 mg | Freq: Four times a day (QID) | INTRAMUSCULAR | Status: DC | PRN

## 2014-03-11 NOTE — Progress Notes (Signed)
CARE MANAGEMENT NOTE 03/11/2014  Patient:  Valerie Silva,Valerie Silva   Account Number:  [id number]  Date Initiated:  03/11/2014  Documentation initiated by:  DAVIS,RHONDA  Subjective/Objective Assessment:   pt with hyponatermia and falls with hypotension.     Action/Plan:   hgome when stable   Anticipated DC Date:  03/14/2014   Anticipated DC Plan:  HOME/SELF CARE  In-house referral  NA      DC Planning Services  CM consult      PAC Choice  NA   Choice offered to / List presented to:             Status of service:  In process, will continue to follow Medicare Important Message given?   (If response is "NO", the following Medicare IM given date fields will be blank) Date Medicare IM given:   Medicare IM given by:   Date Additional Medicare IM given:   Additional Medicare IM given by:    Discharge Disposition:    Per UR Regulation:  Reviewed for med. necessity/level of care/duration of stay  If discussed at Long Length of Stay Meetings, dates discussed:    Comments:  March 11, 2014/Rhonda L. Earlene Plateravis, RN, BSN, CCM. Case Management [employer] [facility] [phone] No discharge needs present of time of review.

## 2014-03-11 NOTE — Telephone Encounter (Signed)
Patient called stating she was in the hospital and she cx 03/12/14.  She stated she would call back to resch once she is out

## 2014-03-11 NOTE — Progress Notes (Signed)
Report to Avery DennisonBrooke RN, Patient transferred to Room to 1427, patient alert oriented x 4, stable and denies needs at this time.

## 2014-03-11 NOTE — Progress Notes (Signed)
Echocardiogram 2D Echocardiogram has been performed.  Valerie Silva 03/11/2014, 1:12 PM

## 2014-03-11 NOTE — ED Notes (Signed)
Patient transported to CT 

## 2014-03-11 NOTE — H&P (Signed)
[facility] History and Physical  Valerie Silva ZOX:[id number] DOB: 1960/01/28 DOA: 03/10/2014  Referring physician: ER physician. PCP: [name], MD  Chief Complaint: Falls.  HPI: Valerie Silva is a 54 y.o. female with a history of hypothyroidism, chronic pain, B-12 and iron deficiency, bipolar disorder presented to the ER because of falls. Patient states that she has had 3 falls today when she was trying to walk. Patient denies any loss of consciousness today but patient states she didn't lose consciousness 2 weeks ago when she fell. During today's fall patient did hit her chin. Denies any incontinence of urine or tongue bite or any seizure-like activities. Denies any chest pain or palpitations. Patient also has been having mild shortness of breath with her extremity edema for which patient states her primary care physician is trying to refer her to a cardiologist. EKG shows first-degree AV block. Patient's labs revealed severe hyponatremia. Patient states that she has been careful about her fluid intake since her last admission October 2015 when she had severe hyponatremia. Patient states the only medication change for her was Lexapro which was changed to Paxil. Patient will be admitted for further management of her hyponatremia and frequent falls.   Review of Systems: As presented in the history of presenting illness, rest negative.  Past Medical History  Diagnosis Date  . Osteoporosis   . Hypothyroidism   . Diabetes mellitus   . Exogenous obesity   . Rhinitis   . Arthritis   . Fibromyalgia   . Somnolence   . Sleep apnea   . Anemia, iron deficiency 04/06/2011  . Pernicious anemia 04/06/2011  . Depression   . Anxiety   . Iron deficiency anemia, unspecified 12/12/2012  . Iron deficiency anemia due to chronic blood loss 01/15/2014   Past Surgical History  Procedure Laterality Date  . Gastric bypass    . Cholecystectomy    . Tracheostomy    . Uvulectomy    .  Tonsillectomy    . Mandible reconstruction    . Spinal fusion    . Dilation and curettage of uterus    . Cesarean section     Social History:  reports that she has never smoked. She has never used smokeless tobacco. She reports that she drinks alcohol. She reports that she does not use illicit drugs. Where does patient live home. Can patient participate in ADLs? Yes.  Allergies  Allergen Reactions  . Iodine Other (See Comments)    Sensitive when used gynecologically (intra-vaginally).  . Iron Itching    Iron taken by mouth causes this reaction. The patient has had several iron infusions plus Feraheme that has not caused any side effects.   . Lovaza [Omega-3-Acid Ethyl Esters] Itching  . Soma [Carisoprodol] Other (See Comments)    Pt states that this medication causes panic attacks.   . Sulfonamide Derivatives Other (See Comments)    Sensitive when used intra-vaginally.  . Benazepril Hcl Other (See Comments)    Reaction:  Severe cough   . Codeine Other (See Comments)    Pt states that this medication causes rapid heartbeat.   Marland Kitchen Penicillins Other (See Comments)    Reaction: Severe family allergy.  Family physician long ago stated she shouldn't take this med since mother and brother nearly died from anaphylaxis.  Marland Kitchen Zolpidem Tartrate Itching and Other (See Comments)    Pt states that this medication causes blackouts.   . Amitriptyline Other (See Comments)    Pt states that this medication  causes sleep walking.     Family History:  Family History  Problem Relation Age of Onset  . CAD Father   . Diabetes Father   . CAD Brother      X3  . Hypertension Brother   . Colon cancer Brother       Prior to Admission medications   Medication Sig Start Date End Date Taking? Authorizing Provider  acarbose (PRECOSE) 25 MG tablet Take 25 mg by mouth daily as needed (for low blood sugar).  10/09/12   Historical Provider, MD  alprazolam Prudy Feeler) 2 MG tablet Take two tablets by mouth twice  daily as needed for anxiety; Take two tablets by mouth at bedtime as needed for sleep or anxiety 11/11/13   Josph Macho, MD  Cholecalciferol (VITAMIN D-3) 5000 UNITS TABS Take 2 tablets by mouth daily.    Historical Provider, MD  Cyanocobalamin (VITAMIN B-12 IJ) Inject as directed every 28 (twenty-eight) days.    Historical Provider, MD  cyclobenzaprine (FLEXERIL) 10 MG tablet Take 1 tablet (10 mg total) by mouth 3 (three) times daily. 11/03/13   Renae Fickle, MD  docusate sodium 100 MG CAPS Take 100 mg by mouth 2 (two) times daily. Patient taking differently: Take 100 mg by mouth as needed.  11/03/13   Renae Fickle, MD  escitalopram (LEXAPRO) 20 MG tablet Take 1 tablet (20 mg total) by mouth daily. For depression and anxiety. 07/13/12   Fransisca Kaufmann, NP  ibuprofen (ADVIL,MOTRIN) 800 MG tablet Take 800 mg by mouth every 6 (six) hours as needed for pain.    Historical Provider, MD  levothyroxine (SYNTHROID, LEVOTHROID) 112 MCG tablet Take 112-224 mcg by mouth daily before breakfast. Pt takes every day, but on Monday she takes 112 mcg.    Historical Provider, MD  loratadine (CLARITIN) 10 MG tablet Take 1 tablet (10 mg total) by mouth 2 (two) times daily. 07/13/12   Fransisca Kaufmann, NP  modafinil (PROVIGIL) 200 MG tablet Take 2 tablets (400 mg total) by mouth every morning. 11/05/13   Mahima Glade Lloyd, MD  mometasone (NASONEX) 50 MCG/ACT nasal spray Place 2 sprays into the nose daily.    Historical Provider, MD  omeprazole (PRILOSEC) 40 MG capsule Take 1 capsule (40 mg total) by mouth daily. 07/13/12   Fransisca Kaufmann, NP  OxyCODONE HCl ER (OXYCONTIN) 30 MG T12A Take 30 mg by mouth 3 (three) times daily. 11/05/13   Oneal Grout, MD  oxyCODONE-acetaminophen (PERCOCET) 10-325 MG per tablet Take 1 tablet by mouth every 4 (four) hours as needed for pain (DO NOT EXCEED 3GM APAP/24 HR). 11/05/13   Oneal Grout, MD  promethazine (PHENERGAN) 25 MG tablet Take 25 mg by mouth every 8 (eight) hours as needed for  nausea.     Historical Provider, MD  SUMAtriptan (IMITREX) 100 MG tablet Take 100 mg by mouth every 2 (two) hours as needed for migraine.  05/01/12   Historical Provider, MD  topiramate (TOPAMAX) 200 MG tablet Take 1 tablet (200 mg total) by mouth at bedtime. 07/13/12   Fransisca Kaufmann, NP  valACYclovir (VALTREX) 1000 MG tablet Take 1,000 mg by mouth daily as needed (for flare ups).  08/16/11   Historical Provider, MD    Physical Exam: Filed Vitals:   03/10/14 2156  BP: 111/88  Pulse: 73  Temp: 98 F (36.7 C)  TempSrc: Oral  Resp: 16  SpO2: 99%     General:  Well-developed and nourished.  Eyes: Anicteric no pallor.  ENT: No discharge from  the ears eyes nose and mouth. Small abrasion on the skin.  Neck: No mass felt. No neck rigidity.  Cardiovascular: S1-S2 heard.  Respiratory: No rhonchi or crepitations.  Abdomen: Soft nontender bowel sounds present.  Skin: Chronic skin changes in the lower extremity.  Musculoskeletal: Edema in both lower extremity.  Psychiatric: Appears normal.  Neurologic: Alert awake oriented to time place and person. Moves all extremities.  Labs on Admission:  Basic Metabolic Panel:  Recent Labs Lab 03/10/14 2224  NA 114*  K 3.6  CL 81*  CO2 25  GLUCOSE 98  BUN 12  CREATININE 0.65  CALCIUM 8.1*  MG 1.6   Liver Function Tests:  Recent Labs Lab 03/10/14 2224  AST 26  ALT 25  ALKPHOS 51  BILITOT 0.7  PROT 6.5  ALBUMIN 3.7   No results for input(s): LIPASE, AMYLASE in the last 168 hours. No results for input(s): AMMONIA in the last 168 hours. CBC:  Recent Labs Lab 03/10/14 2224  WBC 4.5  HGB 10.8*  HCT 31.2*  MCV 89.9  PLT 236   Cardiac Enzymes: No results for input(s): CKTOTAL, CKMB, CKMBINDEX, TROPONINI in the last 168 hours.  BNP (last 3 results) No results for input(s): BNP in the last 8760 hours.  ProBNP (last 3 results) No results for input(s): PROBNP in the last 8760 hours.  CBG: No results for input(s): GLUCAP  in the last 168 hours.  Radiological Exams on Admission: Dg Chest 2 View  03/11/2014   CLINICAL DATA:  Acute onset of syncope. Status post multiple falls today. Initial encounter.  EXAM: CHEST  2 VIEW  COMPARISON:  Chest radiograph performed 02/14/2014  FINDINGS: The lungs are hypoexpanded. Mild vascular crowding is noted. No focal consolidation, pleural effusion or pneumothorax is seen.  The cardiomediastinal silhouette is borderline normal in size. No acute osseous abnormalities are identified. Cervical spinal fusion hardware is noted.  IMPRESSION: Lungs hypoexpanded but grossly clear. No displaced rib fracture seen.   Electronically Signed   By: Roanna RaiderJeffery  Chang M.D.   On: 03/11/2014 00:45    EKG: Independently reviewed. First-degree AV block.  Assessment/Plan Active Problems:   Hypothyroidism   Hyponatremia   Acute hyponatremia   Falls   1. Severe hyponatremia - on exam patient has mild edema of the lower extremities. Will check uric acid levels TSH and serum osmolality and urine sodium and urine osmolality. Patient did receive 1 L normal saline bolus in the ER. During her last admission it was found that patient had mixed picture of hyponatremia secondary to dehydration SIADH and Lexapro. We'll closely follow metabolic panel every 4 hourly with fluid restriction for now. Further recommendations will be based on the studies ordered and sodium trends. During last admission patient's cosyntropin test was normal. 2. Falls - check CT head neck and maxillofacial and x-ray pelvis. A physical therapy consult. Check orthostatic. 3. Shortness of breath with mild lower extremity edema - patient states that patient's primary care physician was planning to refer to cardiologist. We will check BNP and Dopplers of the lower extremities. Check 2-D echo. 4. Hypothyroidism - continue Synthroid. Check TSH. 5. History of bipolar disorder and anxiety - patient medication list shows Lexapro the patient states she  has been taking Paxil. Verify Paxil's dose. 6. Iron deficiency and B-12 deficiency anemia - continue present medications.   DVT Prophylaxis Lovenox.  Code Status: Full code.  Family Communication: None.  Disposition Plan: Admit to inpatient.    KAKRAKANDY,ARSHAD N. [facility] Pager [phone].  If 7PM-7AM, please contact night-coverage [email or web address] Password [email or web address] 03/11/2014, 2:00 AM

## 2014-03-11 NOTE — Progress Notes (Addendum)
CRITICAL VALUE ALERT  Critical value received: NA+ Date of notification:  03/11/14   Time of notification:  03/11/14  Critical value read back Yes  Nurse who received alert:  Ebbie LatusBarbara Sidi Dzikowski RN   MD notified (1st page): Mikhail   Time of first page:  0720  MD notified (2nd page):  Time of second page:  Responding MD:   Time MD responded:  Verbal report

## 2014-03-11 NOTE — Progress Notes (Signed)
VASCULAR LAB PRELIMINARY  PRELIMINARY  PRELIMINARY  PRELIMINARY  Bilateral lower extremity venous duplex completed.    Preliminary report:  Bilateral:  No evidence of DVT, superficial thrombosis, or Baker's Cyst.   Myrick Mcnairy, RVS 03/11/2014, 12:07 PM

## 2014-03-11 NOTE — Progress Notes (Signed)
Patient admitted earlier this morning by Dr. Hulen SkainsArshad Kakranday.  Agree with current assessment and plan.  54 year old female history of hyper iritis, chronic pain, B-12 and iron deficiency anemia, presented to the ER with falls. Patient was noted to have hyponatremia. Patient states she's been having hyponatremia for the last several months and was thought to be secondary to his ideation or Lexapro.  Patient seen and examined. Vital signs stable, blood pressure soft Gen: Well-developed, well-nourished, no apparent distress Cardiovascular: Normal S1-S2, regular rate and rhythm Respiratory: Clear to auscultation, no wheezes or rhonchi noted Neurologic: AAO 3, nonfocal  Assessment and plan Severe hyponatremia -Improving slowly, continue IV fluids. -TSH within normal limits  -Continue BMP every 4 hours.  -patient's last admission cosyntropin test was normal  Frequent falls -Imaging of the head, neck, maxillofacial and pelvis x-rays were all negative -PT consulted  Hypothyroidism -TSH within normal limits, continue Synthroid  Chronic pain -Continue home regimen  Iron deficiency and B-12 deficiency anemia -Continue supplementations   Time spent: 20 minutes  Jaan Fischel D.O. [facility] Pager [phone]  If 7PM-7AM, please contact night-coverage [email or web address] Password [email or web address] 03/11/2014, 11:01 AM

## 2014-03-11 NOTE — ED Notes (Signed)
Patient transported to X-ray 

## 2014-03-12 ENCOUNTER — Ambulatory Visit: Payer: Medicare Other | Admitting: Hematology & Oncology

## 2014-03-12 ENCOUNTER — Ambulatory Visit: Payer: Medicare Other

## 2014-03-12 ENCOUNTER — Other Ambulatory Visit: Payer: Medicare Other | Admitting: Lab

## 2014-03-12 DIAGNOSIS — W19XXXS Unspecified fall, sequela: Secondary | ICD-10-CM

## 2014-03-12 LAB — BASIC METABOLIC PANEL
ANION GAP: 8 (ref 5–15)
Anion gap: 7 (ref 5–15)
Anion gap: 8 (ref 5–15)
BUN: 6 mg/dL (ref 6–23)
BUN: 7 mg/dL (ref 6–23)
BUN: 9 mg/dL (ref 6–23)
CO2: 25 mmol/L (ref 19–32)
CO2: 27 mmol/L (ref 19–32)
CO2: 21 mmol/L (ref 19–32)
Calcium: 7.8 mg/dL — ABNORMAL LOW (ref 8.4–10.5)
Calcium: 7.9 mg/dL — ABNORMAL LOW (ref 8.4–10.5)
Calcium: 7.9 mg/dL — ABNORMAL LOW (ref 8.4–10.5)
Chloride: 87 mmol/L — ABNORMAL LOW (ref 96–112)
Chloride: 88 mmol/L — ABNORMAL LOW (ref 96–112)
Chloride: 92 mmol/L — ABNORMAL LOW (ref 96–112)
Creatinine, Ser: 0.57 mg/dL (ref 0.50–1.10)
Creatinine, Ser: 0.6 mg/dL (ref 0.50–1.10)
Creatinine, Ser: 0.7 mg/dL (ref 0.50–1.10)
GFR calc Af Amer: >90 mL/min (ref >90)
GFR calc Af Amer: >90 mL/min (ref >90)
GFR calc non Af Amer: >90 mL/min (ref >90)
GLUCOSE: 100 mg/dL — AB (ref 70–99)
Glucose, Bld: 81 mg/dL (ref 70–99)
Glucose, Bld: 89 mg/dL (ref 70–99)
POTASSIUM: 3.8 mmol/L (ref 3.5–5.1)
Potassium: 3.8 mmol/L (ref 3.5–5.1)
Potassium: 3.5 mmol/L (ref 3.5–5.1)
SODIUM: 120 mmol/L — AB (ref 135–145)
Sodium: 122 mmol/L — ABNORMAL LOW (ref 135–145)
Sodium: 121 mmol/L — ABNORMAL LOW (ref 135–145)

## 2014-03-12 LAB — CBC
HCT: 29.8 % — ABNORMAL LOW (ref 36.0–46.0)
Hemoglobin: 10.1 g/dL — ABNORMAL LOW (ref 12.0–15.0)
MCH: 30.9 pg (ref 26.0–34.0)
MCHC: 33.9 g/dL (ref 30.0–36.0)
MCV: 91.1 fL (ref 78.0–100.0)
PLATELETS: 187 10*3/uL (ref 150–400)
RBC: 3.27 MIL/uL — AB (ref 3.87–5.11)
RDW: 12.3 % (ref 11.5–15.5)
WBC: 4 10*3/uL (ref 4.0–10.5)

## 2014-03-12 LAB — SODIUM: SODIUM: 121 mmol/L — AB (ref 135–145)

## 2014-03-12 NOTE — Progress Notes (Signed)
PROGRESS NOTE  Valerie HongLaura J Silva WUJ:[id number] DOB: 06/26/1960 DOA: 03/10/2014 PCP: [name],[name], MD  Assessment/Plan:  Severe hyponatremia -dehydration, excess free water intake, Paxil, and poor solute intake due to lack of teeth -Improving slowly, continue IV fluids. -TSH within normal limits  - urine osm, serum osm, UNa, Ucreatinine -patient's last admission cosyntropin test was normal  Frequent falls -Imaging of the head, neck, maxillofacial and pelvis x-rays were all negative -PT consulted  Hypothyroidism -TSH within normal limits, continue Synthroid  Chronic pain -Continue home regimen--OxyContin and percocet  Iron deficiency and B-12 deficiency anemia -Continue supplementations  Bipolar disorder/depression/anxiety, stable. She continued her Xanax, Provigil. Recommended she have her paxil dose decreased slightly in taper off if possible under supervising physician.    Family Communication:   Pt at beside Disposition Plan:   Home when medically stable        Procedures/Studies: Dg Chest 2 View  03/11/2014   CLINICAL DATA:  Acute onset of syncope. Status post multiple falls today. Initial encounter.  EXAM: CHEST  2 VIEW  COMPARISON:  Chest radiograph performed 02/14/2014  FINDINGS: The lungs are hypoexpanded. Mild vascular crowding is noted. No focal consolidation, pleural effusion or pneumothorax is seen.  The cardiomediastinal silhouette is borderline normal in size. No acute osseous abnormalities are identified. Cervical spinal fusion hardware is noted.  IMPRESSION: Lungs hypoexpanded but grossly clear. No displaced rib fracture seen.   Electronically Signed   By: Roanna RaiderJeffery  Chang M.D.   On: 03/11/2014 00:45   Dg Chest 2 View  02/14/2014   CLINICAL DATA:  54 year old female with to falls. Left posterior rib pain, shortness of breath. Initial encounter.  EXAM: CHEST  2 VIEW  COMPARISON:  10/31/2013 and earlier.  FINDINGS: Normal lung volumes. Normal cardiac  size and mediastinal contours. Visualized tracheal air column is within normal limits. No pneumothorax or pleural effusion. The lungs are clear. Cervical ACDF hardware and cholecystectomy clips re- identified. Lower thoracic or upper lumbar compression fractures previously augmented re - identified. No acute osseous abnormality identified.  IMPRESSION: No acute cardiopulmonary abnormality or acute traumatic injury identified.   Electronically Signed   By: Augusto GambleLee  Hall M.D.   On: 02/14/2014 14:14   Dg Ribs Unilateral Left  02/14/2014   CLINICAL DATA:  54 year old female with to falls. Left posterior rib pain, shortness of breath. Initial encounter.  EXAM: LEFT RIBS - 2 VIEW  COMPARISON:  Two-view chest radiographs from today reported separately.  FINDINGS: Rib marker placed at the posterior left eleventh/twelfth rib level. Large body habitus mildly degrades the lower rib bone detail. Left upper quadrant surgical clips re- identified. Augmented lumbar compression fractures re - identified. No displaced left rib fracture identified. No acute osseous abnormality identified.  IMPRESSION: No displaced left rib fracture identified.   Electronically Signed   By: Augusto GambleLee  Hall M.D.   On: 02/14/2014 14:15   Dg Pelvis 1-2 Views  03/11/2014   CLINICAL DATA:  Status post multiple recent falls. Pelvic and coccygeal pain. Initial encounter.  EXAM: PELVIS - 1-2 VIEW  COMPARISON:  Lumbar spine radiographs performed 05/03/2013  FINDINGS: There is no evidence of fracture or dislocation. Both femoral heads are seated normally within their respective acetabula. No significant degenerative change is appreciated. The sacroiliac joints are unremarkable in appearance.  The visualized bowel gas pattern is grossly unremarkable in appearance. Scattered phleboliths are noted within the pelvis. Scattered clips are seen about the pelvis.  IMPRESSION: No evidence  of fracture or dislocation.   Electronically Signed   By: Roanna Raider M.D.   On:  03/11/2014 02:31   Ct Head Wo Contrast  03/11/2014   CLINICAL DATA:  Worsening generalized weakness and recurrent falls. Weakness for 5 weeks. Slowed speech. Concern for maxillofacial or cervical spine injury. Initial encounter.  EXAM: CT HEAD WITHOUT CONTRAST  CT MAXILLOFACIAL WITHOUT CONTRAST  CT CERVICAL SPINE WITHOUT CONTRAST  TECHNIQUE: Multidetector CT imaging of the head, cervical spine, and maxillofacial structures were performed using the standard protocol without intravenous contrast. Multiplanar CT image reconstructions of the cervical spine and maxillofacial structures were also generated.  COMPARISON:  CT of the head performed 10/31/2013, CT of the cervical spine performed 05/03/2013, and MRI of the brain performed 09/20/2011  FINDINGS: CT HEAD FINDINGS  There is no evidence of acute infarction, mass lesion, or intra- or extra-axial hemorrhage on CT.  The posterior fossa, including the cerebellum, brainstem and fourth ventricle, is within normal limits. The third and lateral ventricles, and basal ganglia are unremarkable in appearance. The cerebral hemispheres are symmetric in appearance, with normal gray-white differentiation. No mass effect or midline shift is seen.  There is no evidence of fracture; visualized osseous structures are unremarkable in appearance. The orbits are within normal limits. The paranasal sinuses and mastoid air cells are well-aerated. No significant soft tissue abnormalities are seen.  CT MAXILLOFACIAL FINDINGS  There is no evidence of fracture or dislocation. The maxilla and mandible appear intact. Postoperative change is noted at the central mandible. The nasal bone is unremarkable in appearance. There is chronic complete absence of the dentition.  The orbits are intact bilaterally. The visualized paranasal sinuses and mastoid air cells are well-aerated.  No significant soft tissue abnormalities are seen. The parapharyngeal fat planes are preserved. The nasopharynx,  oropharynx and hypopharynx are unremarkable in appearance. The visualized portions of the valleculae and piriform sinuses are grossly unremarkable.  The parotid and submandibular glands are within normal limits. No cervical lymphadenopathy is seen.  CT CERVICAL SPINE FINDINGS  There is no evidence of fracture or subluxation. The patient is status post anterior cervical spinal fusion at C4-C7. Vertebral bodies demonstrate normal height and alignment. Prevertebral soft tissues are within normal limits.  The thyroid gland is unremarkable in appearance. Minimal interstitial prominence at the lung apices is likely transient in nature. No significant soft tissue abnormalities are seen.  IMPRESSION: 1. No evidence of traumatic intracranial injury or fracture. 2. No evidence of fracture or dislocation with regard to the maxillofacial structures. 3. No evidence of fracture or subluxation along the cervical spine. 4. Status post anterior cervical spinal fusion at C4-C7.   Electronically Signed   By: Roanna Raider M.D.   On: 03/11/2014 02:59   Ct Cervical Spine Wo Contrast  03/11/2014   CLINICAL DATA:  Worsening generalized weakness and recurrent falls. Weakness for 5 weeks. Slowed speech. Concern for maxillofacial or cervical spine injury. Initial encounter.  EXAM: CT HEAD WITHOUT CONTRAST  CT MAXILLOFACIAL WITHOUT CONTRAST  CT CERVICAL SPINE WITHOUT CONTRAST  TECHNIQUE: Multidetector CT imaging of the head, cervical spine, and maxillofacial structures were performed using the standard protocol without intravenous contrast. Multiplanar CT image reconstructions of the cervical spine and maxillofacial structures were also generated.  COMPARISON:  CT of the head performed 10/31/2013, CT of the cervical spine performed 05/03/2013, and MRI of the brain performed 09/20/2011  FINDINGS: CT HEAD FINDINGS  There is no evidence of acute infarction, mass lesion, or intra- or extra-axial  hemorrhage on CT.  The posterior fossa,  including the cerebellum, brainstem and fourth ventricle, is within normal limits. The third and lateral ventricles, and basal ganglia are unremarkable in appearance. The cerebral hemispheres are symmetric in appearance, with normal gray-white differentiation. No mass effect or midline shift is seen.  There is no evidence of fracture; visualized osseous structures are unremarkable in appearance. The orbits are within normal limits. The paranasal sinuses and mastoid air cells are well-aerated. No significant soft tissue abnormalities are seen.  CT MAXILLOFACIAL FINDINGS  There is no evidence of fracture or dislocation. The maxilla and mandible appear intact. Postoperative change is noted at the central mandible. The nasal bone is unremarkable in appearance. There is chronic complete absence of the dentition.  The orbits are intact bilaterally. The visualized paranasal sinuses and mastoid air cells are well-aerated.  No significant soft tissue abnormalities are seen. The parapharyngeal fat planes are preserved. The nasopharynx, oropharynx and hypopharynx are unremarkable in appearance. The visualized portions of the valleculae and piriform sinuses are grossly unremarkable.  The parotid and submandibular glands are within normal limits. No cervical lymphadenopathy is seen.  CT CERVICAL SPINE FINDINGS  There is no evidence of fracture or subluxation. The patient is status post anterior cervical spinal fusion at C4-C7. Vertebral bodies demonstrate normal height and alignment. Prevertebral soft tissues are within normal limits.  The thyroid gland is unremarkable in appearance. Minimal interstitial prominence at the lung apices is likely transient in nature. No significant soft tissue abnormalities are seen.  IMPRESSION: 1. No evidence of traumatic intracranial injury or fracture. 2. No evidence of fracture or dislocation with regard to the maxillofacial structures. 3. No evidence of fracture or subluxation along the  cervical spine. 4. Status post anterior cervical spinal fusion at C4-C7.   Electronically Signed   By: Roanna Raider M.D.   On: 03/11/2014 02:59   Ct Maxillofacial Wo Cm  03/11/2014   CLINICAL DATA:  Worsening generalized weakness and recurrent falls. Weakness for 5 weeks. Slowed speech. Concern for maxillofacial or cervical spine injury. Initial encounter.  EXAM: CT HEAD WITHOUT CONTRAST  CT MAXILLOFACIAL WITHOUT CONTRAST  CT CERVICAL SPINE WITHOUT CONTRAST  TECHNIQUE: Multidetector CT imaging of the head, cervical spine, and maxillofacial structures were performed using the standard protocol without intravenous contrast. Multiplanar CT image reconstructions of the cervical spine and maxillofacial structures were also generated.  COMPARISON:  CT of the head performed 10/31/2013, CT of the cervical spine performed 05/03/2013, and MRI of the brain performed 09/20/2011  FINDINGS: CT HEAD FINDINGS  There is no evidence of acute infarction, mass lesion, or intra- or extra-axial hemorrhage on CT.  The posterior fossa, including the cerebellum, brainstem and fourth ventricle, is within normal limits. The third and lateral ventricles, and basal ganglia are unremarkable in appearance. The cerebral hemispheres are symmetric in appearance, with normal gray-white differentiation. No mass effect or midline shift is seen.  There is no evidence of fracture; visualized osseous structures are unremarkable in appearance. The orbits are within normal limits. The paranasal sinuses and mastoid air cells are well-aerated. No significant soft tissue abnormalities are seen.  CT MAXILLOFACIAL FINDINGS  There is no evidence of fracture or dislocation. The maxilla and mandible appear intact. Postoperative change is noted at the central mandible. The nasal bone is unremarkable in appearance. There is chronic complete absence of the dentition.  The orbits are intact bilaterally. The visualized paranasal sinuses and mastoid air cells are  well-aerated.  No significant soft tissue abnormalities  are seen. The parapharyngeal fat planes are preserved. The nasopharynx, oropharynx and hypopharynx are unremarkable in appearance. The visualized portions of the valleculae and piriform sinuses are grossly unremarkable.  The parotid and submandibular glands are within normal limits. No cervical lymphadenopathy is seen.  CT CERVICAL SPINE FINDINGS  There is no evidence of fracture or subluxation. The patient is status post anterior cervical spinal fusion at C4-C7. Vertebral bodies demonstrate normal height and alignment. Prevertebral soft tissues are within normal limits.  The thyroid gland is unremarkable in appearance. Minimal interstitial prominence at the lung apices is likely transient in nature. No significant soft tissue abnormalities are seen.  IMPRESSION: 1. No evidence of traumatic intracranial injury or fracture. 2. No evidence of fracture or dislocation with regard to the maxillofacial structures. 3. No evidence of fracture or subluxation along the cervical spine. 4. Status post anterior cervical spinal fusion at C4-C7.   Electronically Signed   By: Roanna Raider M.D.   On: 03/11/2014 02:59         Subjective: Patient denies fevers, chills, headache, chest pain, dyspnea, nausea, vomiting, diarrhea, abdominal pain, dysuria, hematuria. C/o HA   Objective: Filed Vitals:   03/11/14 1952 03/11/14 2352 03/12/14 0521 03/12/14 1437  BP:  120/74 116/74 104/63  Pulse:  69 63 73  Temp: 98.4 F (36.9 C) 97.5 F (36.4 C) 97.6 F (36.4 C) 98.1 F (36.7 C)  TempSrc: Axillary Oral Oral Oral  Resp:  16 14 16   Height:  5\' 8"  (1.727 m) 5\' 8"  (1.727 m)   Weight:  95.437 kg (210 lb 6.4 oz) 95.528 kg (210 lb 9.6 oz)   SpO2:  100% 99% 95%    Intake/Output Summary (Last 24 hours) at 03/12/14 1916 Last data filed at 03/12/14 1808  Gross per 24 hour  Intake 3399.58 ml  Output   1250 ml  Net 2149.58 ml   Weight change: -2.663 kg (-5 lb 13.9  oz) Exam:   General:  Pt is alert, follows commands appropriately, not in acute distress  HEENT: No icterus, No thrush,[place]/AT  Cardiovascular: RRR, S1/S2, no rubs, no gallops  Respiratory: CTA bilaterally, no wheezing, no crackles, no rhonchi  Abdomen: Soft/+BS, non tender, non distended, no guarding  Extremities: No edema, No lymphangitis, No petechiae, No rashes, no synovitis  Data Reviewed: Basic Metabolic Panel:  Recent Labs Lab 03/10/14 2224  03/11/14 1450 03/11/14 1925 03/12/14 0001 03/12/14 0517 03/12/14 1122 03/12/14 1629  NA 114*  < > 117* 116* 121* 120* 122* 121*  K 3.6  < > 3.6 3.4*  --  3.8 3.8 3.5  CL 81*  < > 87* 85*  --  87* 88* 92*  CO2 25  < > 26 23  --  25 27 21   GLUCOSE 98  < > 88 97  --  81 89 100*  BUN 12  < > 6 6  --  6 7 9   CREATININE 0.65  < > 0.52 0.42*  --  0.57 0.60 0.70  CALCIUM 8.1*  < > 7.7* 7.7*  --  7.8* 7.9* 7.9*  MG 1.6  --   --   --   --   --   --   --   < > = values in this interval not displayed. Liver Function Tests:  Recent Labs Lab 03/10/14 2224  AST 26  ALT 25  ALKPHOS 51  BILITOT 0.7  PROT 6.5  ALBUMIN 3.7   No results for input(s): LIPASE, AMYLASE in the  last 168 hours. No results for input(s): AMMONIA in the last 168 hours. CBC:  Recent Labs Lab 03/10/14 2224 03/11/14 0209 03/11/14 0605 03/12/14 0517  WBC 4.5  --  4.2 4.0  NEUTROABS  --   --  2.8  --   HGB 10.8* 14.3 10.2* 10.1*  HCT 31.2* 42.0 29.6* 29.8*  MCV 89.9  --  89.7 91.1  PLT 236  --  213 187   Cardiac Enzymes: No results for input(s): CKTOTAL, CKMB, CKMBINDEX, TROPONINI in the last 168 hours. BNP: Invalid input(s): POCBNP CBG: No results for input(s): GLUCAP in the last 168 hours.  Recent Results (from the past 240 hour(s))  MRSA PCR Screening     Status: None   Collection Time: 03/11/14  4:29 AM  Result Value Ref Range Status   MRSA by PCR NEGATIVE NEGATIVE Final    Comment:        The GeneXpert MRSA Assay (FDA approved for NASAL  specimens only), is one component of a comprehensive MRSA colonization surveillance program. It is not intended to diagnose MRSA infection nor to guide or monitor treatment for MRSA infections.      Scheduled Meds: . cyclobenzaprine  10 mg Oral TID  . enoxaparin (LOVENOX) injection  40 mg Subcutaneous Q24H  . fluticasone  2 spray Each Nare Daily  . [START ON 03/17/2014] levothyroxine  112 mcg Oral Once per day on Mon  . levothyroxine  224 mcg Oral Once per day on Sun Tue Wed Thu Fri Sat  . loratadine  10 mg Oral BID  . modafinil  400 mg Oral q morning - 10a  . OxyCODONE  30 mg Oral 3 times per day  . pantoprazole  40 mg Oral Daily  . sodium chloride  3 mL Intravenous Q12H  . topiramate  200 mg Oral QHS   Continuous Infusions: . sodium chloride 75 mL/hr at 03/12/14 1149     Louine Tenpenny, DO  [facility] Pager [phone]  If 7PM-7AM, please contact night-coverage [email or web address] Password [email or web address] 03/12/2014, 7:16 PM   LOS: 1 day

## 2014-03-13 DIAGNOSIS — E038 Other specified hypothyroidism: Secondary | ICD-10-CM

## 2014-03-13 DIAGNOSIS — W19XXXA Unspecified fall, initial encounter: Secondary | ICD-10-CM | POA: Insufficient documentation

## 2014-03-13 LAB — BASIC METABOLIC PANEL
Anion gap: 4 — ABNORMAL LOW (ref 5–15)
BUN: 11 mg/dL (ref 6–23)
CO2: 27 mmol/L (ref 19–32)
Calcium: 7.8 mg/dL — ABNORMAL LOW (ref 8.4–10.5)
Chloride: 92 mmol/L — ABNORMAL LOW (ref 96–112)
Creatinine, Ser: 0.71 mg/dL (ref 0.50–1.10)
GFR calc Af Amer: >90 mL/min (ref >90)
Glucose, Bld: 85 mg/dL (ref 70–99)
POTASSIUM: 3.8 mmol/L (ref 3.5–5.1)
Sodium: 123 mmol/L — ABNORMAL LOW (ref 135–145)

## 2014-03-13 LAB — SODIUM, URINE, RANDOM: Sodium, Ur: 51 mEq/L

## 2014-03-13 LAB — LIPID PANEL
CHOLESTEROL: 123 mg/dL (ref 0–200)
HDL: 41 mg/dL (ref >39)
LDL CALC: 54 mg/dL (ref 0–99)
Total CHOL/HDL Ratio: 3 RATIO
Triglycerides: 138 mg/dL (ref <150)
VLDL: 28 mg/dL (ref 0–40)

## 2014-03-13 LAB — CREATININE, URINE, RANDOM: Creatinine, Urine: 31 mg/dL

## 2014-03-13 LAB — OSMOLALITY: Osmolality: 254 mOsm/kg — ABNORMAL LOW (ref 275–300)

## 2014-03-13 LAB — OSMOLALITY, URINE: OSMOLALITY UR: 243 mosm/kg — AB (ref 390–1090)

## 2014-03-13 MED ORDER — PROCHLORPERAZINE EDISYLATE 5 MG/ML IJ SOLN
5.0000 mg | Freq: Once | INTRAMUSCULAR | Status: AC
  Administered 2014-03-13: 5 mg via INTRAVENOUS
  Filled 2014-03-13: qty 2

## 2014-03-13 MED ORDER — DIPHENHYDRAMINE HCL 50 MG/ML IJ SOLN
25.0000 mg | Freq: Once | INTRAMUSCULAR | Status: AC
  Administered 2014-03-13: 25 mg via INTRAVENOUS
  Filled 2014-03-13: qty 1

## 2014-03-13 NOTE — Evaluation (Signed)
Physical Therapy Evaluation Patient Details Name: Valerie Silva MRN: [id number] DOB: 08-13-1960 Today's Date: 03/13/2014   History of Present Illness  54 yo female admitted with hyponatremia, frequent falls. hx of bipolar d/o, fibromyalgia, chronic pain, osteoporosis, DM, anxiety  Clinical Impression  On eval, pt was Min guard assist for mobility-able to ambulate ~250 feet with intermittent use of hallway handrail. Slightly unsteady at times but not overt LOB. Pt tolerated activity well.  Recommend HHPT to ensure safe mobility in home environment and continued balance training.     Follow Up Recommendations Home health PT    Equipment Recommendations  None recommended by PT    Recommendations for Other Services       Precautions / Restrictions Precautions Precautions: Fall Restrictions Weight Bearing Restrictions: No      Mobility  Bed Mobility Overal bed mobility: Modified Independent                Transfers Overall transfer level: Modified independent                  Ambulation/Gait Ambulation/Gait assistance: Min guard Ambulation Distance (Feet): 250 Feet Assistive device: None (hallway handrail intermittently) Gait Pattern/deviations: Step-through pattern     General Gait Details: slow gait speed. close guard for safety.  Stairs            Wheelchair Mobility    Modified Rankin (Stroke Patients Only)       Balance Overall balance assessment: History of Falls Sitting-balance support: Feet supported;No upper extremity supported Sitting balance-Leahy Scale: Normal     Standing balance support: No upper extremity supported;During functional activity Standing balance-Leahy Scale: Good Standing balance comment: EO/EC static standing, narrow BOS static standing-noted increased sway but good ankle strategy utilized to avoid LOB. 360 degree turn-close guard.                              Pertinent Vitals/Pain Pain Assessment:  0-10 Pain Score: 10-Worst pain ever Pain Location: back, head Pain Descriptors / Indicators: Aching Pain Intervention(s): Monitored during session (RN gave meds already per pt)    Home Living Family/patient expects to be discharged to:: Private residence Living Arrangements: Alone   Type of Home: Apartment Home Access: Stairs to enter Entrance Stairs-Rails: Right Entrance Stairs-Number of Steps: 2 Home Layout: One level Home Equipment: Cane - single point;Wheelchair - manual      Prior Function Level of Independence: Independent with assistive device(s)         Comments: uses cane intermiitently     Hand Dominance        Extremity/Trunk Assessment   Upper Extremity Assessment: Overall WFL for tasks assessed           Lower Extremity Assessment: Overall WFL for tasks assessed      Cervical / Trunk Assessment: Kyphotic  Communication   Communication: No difficulties  Cognition                            General Comments      Exercises        Assessment/Plan    PT Assessment Patient needs continued PT services  PT Diagnosis Generalized weakness;Acute pain;Difficulty walking   PT Problem List Decreased strength;Decreased activity tolerance;Decreased balance;Decreased mobility;Pain  PT Treatment Interventions Gait training;Functional mobility training;Therapeutic activities;Therapeutic exercise;Patient/family education;Balance training   PT Goals (Current goals can be found in the Care Plan section)  Acute Rehab PT Goals Patient Stated Goal: home soon PT Goal Formulation: With patient Time For Goal Achievement: 03/27/14 Potential to Achieve Goals: Good    Frequency Min 3X/week   Barriers to discharge        Co-evaluation               End of Session Equipment Utilized During Treatment: Gait belt Activity Tolerance: Patient tolerated treatment well Patient left: in bed;with call bell/phone within reach           Time:  1021-1044 PT Time Calculation (min) (ACUTE ONLY): 23 min   Charges:   PT Evaluation $Initial PT Evaluation Tier I: 1 Procedure PT Treatments $Gait Training: 8-22 mins   PT G Codes:        Rebeca AlertJannie Renia Mikelson, MPT Pager: [phone]

## 2014-03-13 NOTE — Progress Notes (Signed)
PROGRESS NOTE  Valerie Silva ZOX:[id number] DOB: 25-Feb-1960 DOA: 03/10/2014 PCP: [name], MD  Assessment/Plan:  Severe hyponatremia -dehydration, excess free water intake, Paxil, SIADH,and poor solute intake due to lack of teeth -Improving slowly, continue IV fluids. -TSH within normal limits  - urine osm--243, serum osm--254  -UNa, Ucreatinine-->FeNa--0.9% -patient's last admission cosyntropin test was normal (11/02/13) -fluid restrict po fluid intake  Frequent falls -Imaging of the head, neck, maxillofacial and pelvis x-rays were all negative -PT consulted-->HHPT  Hypothyroidism -TSH within normal limits, continue Synthroid  Chronic pain -Continue home regimen--OxyContin and percocet  Iron deficiency and B-12 deficiency anemia -Continue supplementations  Bipolar disorder/depression/anxiety, stable. She continued her Xanax, Provigil. Recommended she have her paxil dose decreased slightly in taper off if possible under supervising physician.   Family Communication:   Pt at beside Disposition Plan:   Home when Na >129     Procedures/Studies: Dg Chest 2 View  03/11/2014   CLINICAL DATA:  Acute onset of syncope. Status post multiple falls today. Initial encounter.  EXAM: CHEST  2 VIEW  COMPARISON:  Chest radiograph performed 02/14/2014  FINDINGS: The lungs are hypoexpanded. Mild vascular crowding is noted. No focal consolidation, pleural effusion or pneumothorax is seen.  The cardiomediastinal silhouette is borderline normal in size. No acute osseous abnormalities are identified. Cervical spinal fusion hardware is noted.  IMPRESSION: Lungs hypoexpanded but grossly clear. No displaced rib fracture seen.   Electronically Signed   By: Roanna Raider M.D.   On: 03/11/2014 00:45   Dg Chest 2 View  02/14/2014   CLINICAL DATA:  54 year old female with to falls. Left posterior rib pain, shortness of breath. Initial encounter.  EXAM: CHEST  2 VIEW  COMPARISON:   10/31/2013 and earlier.  FINDINGS: Normal lung volumes. Normal cardiac size and mediastinal contours. Visualized tracheal air column is within normal limits. No pneumothorax or pleural effusion. The lungs are clear. Cervical ACDF hardware and cholecystectomy clips re- identified. Lower thoracic or upper lumbar compression fractures previously augmented re - identified. No acute osseous abnormality identified.  IMPRESSION: No acute cardiopulmonary abnormality or acute traumatic injury identified.   Electronically Signed   By: Augusto Gamble M.D.   On: 02/14/2014 14:14   Dg Ribs Unilateral Left  02/14/2014   CLINICAL DATA:  54 year old female with to falls. Left posterior rib pain, shortness of breath. Initial encounter.  EXAM: LEFT RIBS - 2 VIEW  COMPARISON:  Two-view chest radiographs from today reported separately.  FINDINGS: Rib marker placed at the posterior left eleventh/twelfth rib level. Large body habitus mildly degrades the lower rib bone detail. Left upper quadrant surgical clips re- identified. Augmented lumbar compression fractures re - identified. No displaced left rib fracture identified. No acute osseous abnormality identified.  IMPRESSION: No displaced left rib fracture identified.   Electronically Signed   By: Augusto Gamble M.D.   On: 02/14/2014 14:15   Dg Pelvis 1-2 Views  03/11/2014   CLINICAL DATA:  Status post multiple recent falls. Pelvic and coccygeal pain. Initial encounter.  EXAM: PELVIS - 1-2 VIEW  COMPARISON:  Lumbar spine radiographs performed 05/03/2013  FINDINGS: There is no evidence of fracture or dislocation. Both femoral heads are seated normally within their respective acetabula. No significant degenerative change is appreciated. The sacroiliac joints are unremarkable in appearance.  The visualized bowel gas pattern is grossly unremarkable in appearance. Scattered phleboliths are noted within the pelvis. Scattered clips are seen about the pelvis.  IMPRESSION: No evidence of fracture or  dislocation.   Electronically Signed   By: Roanna Raider M.D.   On: 03/11/2014 02:31   Ct Head Wo Contrast  03/11/2014   CLINICAL DATA:  Worsening generalized weakness and recurrent falls. Weakness for 5 weeks. Slowed speech. Concern for maxillofacial or cervical spine injury. Initial encounter.  EXAM: CT HEAD WITHOUT CONTRAST  CT MAXILLOFACIAL WITHOUT CONTRAST  CT CERVICAL SPINE WITHOUT CONTRAST  TECHNIQUE: Multidetector CT imaging of the head, cervical spine, and maxillofacial structures were performed using the standard protocol without intravenous contrast. Multiplanar CT image reconstructions of the cervical spine and maxillofacial structures were also generated.  COMPARISON:  CT of the head performed 10/31/2013, CT of the cervical spine performed 05/03/2013, and MRI of the brain performed 09/20/2011  FINDINGS: CT HEAD FINDINGS  There is no evidence of acute infarction, mass lesion, or intra- or extra-axial hemorrhage on CT.  The posterior fossa, including the cerebellum, brainstem and fourth ventricle, is within normal limits. The third and lateral ventricles, and basal ganglia are unremarkable in appearance. The cerebral hemispheres are symmetric in appearance, with normal gray-white differentiation. No mass effect or midline shift is seen.  There is no evidence of fracture; visualized osseous structures are unremarkable in appearance. The orbits are within normal limits. The paranasal sinuses and mastoid air cells are well-aerated. No significant soft tissue abnormalities are seen.  CT MAXILLOFACIAL FINDINGS  There is no evidence of fracture or dislocation. The maxilla and mandible appear intact. Postoperative change is noted at the central mandible. The nasal bone is unremarkable in appearance. There is chronic complete absence of the dentition.  The orbits are intact bilaterally. The visualized paranasal sinuses and mastoid air cells are well-aerated.  No significant soft tissue abnormalities are seen.  The parapharyngeal fat planes are preserved. The nasopharynx, oropharynx and hypopharynx are unremarkable in appearance. The visualized portions of the valleculae and piriform sinuses are grossly unremarkable.  The parotid and submandibular glands are within normal limits. No cervical lymphadenopathy is seen.  CT CERVICAL SPINE FINDINGS  There is no evidence of fracture or subluxation. The patient is status post anterior cervical spinal fusion at C4-C7. Vertebral bodies demonstrate normal height and alignment. Prevertebral soft tissues are within normal limits.  The thyroid gland is unremarkable in appearance. Minimal interstitial prominence at the lung apices is likely transient in nature. No significant soft tissue abnormalities are seen.  IMPRESSION: 1. No evidence of traumatic intracranial injury or fracture. 2. No evidence of fracture or dislocation with regard to the maxillofacial structures. 3. No evidence of fracture or subluxation along the cervical spine. 4. Status post anterior cervical spinal fusion at C4-C7.   Electronically Signed   By: Roanna Raider M.D.   On: 03/11/2014 02:59   Ct Cervical Spine Wo Contrast  03/11/2014   CLINICAL DATA:  Worsening generalized weakness and recurrent falls. Weakness for 5 weeks. Slowed speech. Concern for maxillofacial or cervical spine injury. Initial encounter.  EXAM: CT HEAD WITHOUT CONTRAST  CT MAXILLOFACIAL WITHOUT CONTRAST  CT CERVICAL SPINE WITHOUT CONTRAST  TECHNIQUE: Multidetector CT imaging of the head, cervical spine, and maxillofacial structures were performed using the standard protocol without intravenous contrast. Multiplanar CT image reconstructions of the cervical spine and maxillofacial structures were also generated.  COMPARISON:  CT of the head performed 10/31/2013, CT of the cervical spine performed 05/03/2013, and MRI of the brain performed 09/20/2011  FINDINGS: CT HEAD FINDINGS  There is no evidence of acute infarction, mass lesion, or  intra-  or extra-axial hemorrhage on CT.  The posterior fossa, including the cerebellum, brainstem and fourth ventricle, is within normal limits. The third and lateral ventricles, and basal ganglia are unremarkable in appearance. The cerebral hemispheres are symmetric in appearance, with normal gray-white differentiation. No mass effect or midline shift is seen.  There is no evidence of fracture; visualized osseous structures are unremarkable in appearance. The orbits are within normal limits. The paranasal sinuses and mastoid air cells are well-aerated. No significant soft tissue abnormalities are seen.  CT MAXILLOFACIAL FINDINGS  There is no evidence of fracture or dislocation. The maxilla and mandible appear intact. Postoperative change is noted at the central mandible. The nasal bone is unremarkable in appearance. There is chronic complete absence of the dentition.  The orbits are intact bilaterally. The visualized paranasal sinuses and mastoid air cells are well-aerated.  No significant soft tissue abnormalities are seen. The parapharyngeal fat planes are preserved. The nasopharynx, oropharynx and hypopharynx are unremarkable in appearance. The visualized portions of the valleculae and piriform sinuses are grossly unremarkable.  The parotid and submandibular glands are within normal limits. No cervical lymphadenopathy is seen.  CT CERVICAL SPINE FINDINGS  There is no evidence of fracture or subluxation. The patient is status post anterior cervical spinal fusion at C4-C7. Vertebral bodies demonstrate normal height and alignment. Prevertebral soft tissues are within normal limits.  The thyroid gland is unremarkable in appearance. Minimal interstitial prominence at the lung apices is likely transient in nature. No significant soft tissue abnormalities are seen.  IMPRESSION: 1. No evidence of traumatic intracranial injury or fracture. 2. No evidence of fracture or dislocation with regard to the maxillofacial structures. 3.  No evidence of fracture or subluxation along the cervical spine. 4. Status post anterior cervical spinal fusion at C4-C7.   Electronically Signed   By: Roanna RaiderJeffery  Chang M.D.   On: 03/11/2014 02:59   Ct Maxillofacial Wo Cm  03/11/2014   CLINICAL DATA:  Worsening generalized weakness and recurrent falls. Weakness for 5 weeks. Slowed speech. Concern for maxillofacial or cervical spine injury. Initial encounter.  EXAM: CT HEAD WITHOUT CONTRAST  CT MAXILLOFACIAL WITHOUT CONTRAST  CT CERVICAL SPINE WITHOUT CONTRAST  TECHNIQUE: Multidetector CT imaging of the head, cervical spine, and maxillofacial structures were performed using the standard protocol without intravenous contrast. Multiplanar CT image reconstructions of the cervical spine and maxillofacial structures were also generated.  COMPARISON:  CT of the head performed 10/31/2013, CT of the cervical spine performed 05/03/2013, and MRI of the brain performed 09/20/2011  FINDINGS: CT HEAD FINDINGS  There is no evidence of acute infarction, mass lesion, or intra- or extra-axial hemorrhage on CT.  The posterior fossa, including the cerebellum, brainstem and fourth ventricle, is within normal limits. The third and lateral ventricles, and basal ganglia are unremarkable in appearance. The cerebral hemispheres are symmetric in appearance, with normal gray-white differentiation. No mass effect or midline shift is seen.  There is no evidence of fracture; visualized osseous structures are unremarkable in appearance. The orbits are within normal limits. The paranasal sinuses and mastoid air cells are well-aerated. No significant soft tissue abnormalities are seen.  CT MAXILLOFACIAL FINDINGS  There is no evidence of fracture or dislocation. The maxilla and mandible appear intact. Postoperative change is noted at the central mandible. The nasal bone is unremarkable in appearance. There is chronic complete absence of the dentition.  The orbits are intact bilaterally. The  visualized paranasal sinuses and mastoid air cells are well-aerated.  No significant  soft tissue abnormalities are seen. The parapharyngeal fat planes are preserved. The nasopharynx, oropharynx and hypopharynx are unremarkable in appearance. The visualized portions of the valleculae and piriform sinuses are grossly unremarkable.  The parotid and submandibular glands are within normal limits. No cervical lymphadenopathy is seen.  CT CERVICAL SPINE FINDINGS  There is no evidence of fracture or subluxation. The patient is status post anterior cervical spinal fusion at C4-C7. Vertebral bodies demonstrate normal height and alignment. Prevertebral soft tissues are within normal limits.  The thyroid gland is unremarkable in appearance. Minimal interstitial prominence at the lung apices is likely transient in nature. No significant soft tissue abnormalities are seen.  IMPRESSION: 1. No evidence of traumatic intracranial injury or fracture. 2. No evidence of fracture or dislocation with regard to the maxillofacial structures. 3. No evidence of fracture or subluxation along the cervical spine. 4. Status post anterior cervical spinal fusion at C4-C7.   Electronically Signed   By: Roanna Raider M.D.   On: 03/11/2014 02:59         Subjective: Patient with the headache that was given some relief with Compazine and Benadryl. She denies any fever, chills,  vomiting, diarrhea, pain, chest pain and shortness breath.  Objective: Filed Vitals:   03/12/14 1437 03/12/14 2207 03/13/14 0451 03/13/14 1318  BP: 104/63 112/71 104/68 95/58  Pulse: 73  65 68  Temp: 98.1 F (36.7 C) 98.4 F (36.9 C) 97.9 F (36.6 C) 98.1 F (36.7 C)  TempSrc: Oral Oral Oral Oral  Resp: Height:      Weight:   94.983 kg (209 lb 6.4 oz)   SpO2: 95% 99% 92% 94%    Intake/Output Summary (Last 24 hours) at 03/13/14 1728 Last data filed at 03/13/14 1500  Gross per 24 hour  Intake   2370 ml  Output   3000 ml  Net   -630 ml     Weight change: -0.454 kg (-1 lb) Exam:   General:  Pt is alert, follows commands appropriately, not in acute distress  HEENT: No icterus, No thrush, No meningismus, [place] I/AT  Cardiovascular: RRR, S1/S2, no rubs, no gallops  Respiratory: CTA bilaterally, no wheezing, no crackles, no rhonchi  Abdomen: Soft/+BS, non tender, non distended, no guarding  Extremities: No edema, No lymphangitis, No petechiae, No rashes, no synovitis  Data Reviewed: Basic Metabolic Panel:  Recent Labs Lab 03/10/14 2224  03/11/14 1925 03/12/14 0001 03/12/14 0517 03/12/14 1122 03/12/14 1629 03/13/14 0548  NA 114*  < > 116* 121* 120* 122* 121* 123*  K 3.6  < > 3.4*  --  3.8 3.8 3.5 3.8  CL 81*  < > 85*  --  87* 88* 92* 92*  CO2 25  < > 23  --  GLUCOSE 98  < > 97  --  81 89 100* 85  BUN 12  < > 6  --  CREATININE 0.65  < > 0.42*  --  0.57 0.60 0.70 0.71  CALCIUM 8.1*  < > 7.7*  --  7.8* 7.9* 7.9* 7.8*  MG 1.6  --   --   --   --   --   --   --   < > = values in this interval not displayed. Liver Function Tests:  Recent Labs Lab 03/10/14 2224  AST 26  ALT 25  ALKPHOS 51  BILITOT 0.7  PROT 6.5  ALBUMIN 3.7   No  results for input(s): LIPASE, AMYLASE in the last 168 hours. No results for input(s): AMMONIA in the last 168 hours. CBC:  Recent Labs Lab 03/10/14 2224 03/11/14 0209 03/11/14 0605 03/12/14 0517  WBC 4.5  --  4.2 4.0  NEUTROABS  --   --  2.8  --   HGB 10.8* 14.3 10.2* 10.1*  HCT 31.2* 42.0 29.6* 29.8*  MCV 89.9  --  89.7 91.1  PLT 236  --  213 187   Cardiac Enzymes: No results for input(s): CKTOTAL, CKMB, CKMBINDEX, TROPONINI in the last 168 hours. BNP: Invalid input(s): POCBNP CBG: No results for input(s): GLUCAP in the last 168 hours.  Recent Results (from the past 240 hour(s))  MRSA PCR Screening     Status: None   Collection Time: 03/11/14  4:29 AM  Result Value Ref Range Status   MRSA by PCR NEGATIVE NEGATIVE Final    Comment:         The GeneXpert MRSA Assay (FDA approved for NASAL specimens only), is one component of a comprehensive MRSA colonization surveillance program. It is not intended to diagnose MRSA infection nor to guide or monitor treatment for MRSA infections.      Scheduled Meds: . cyclobenzaprine  10 mg Oral TID  . enoxaparin (LOVENOX) injection  40 mg Subcutaneous Q24H  . fluticasone  2 spray Each Nare Daily  . [START ON 03/17/2014] levothyroxine  112 mcg Oral Once per day on Mon  . levothyroxine  224 mcg Oral Once per day on Sun Tue Wed Thu Fri Sat  . loratadine  10 mg Oral BID  . modafinil  400 mg Oral q morning - 10a  . OxyCODONE  30 mg Oral 3 times per day  . pantoprazole  40 mg Oral Daily  . sodium chloride  3 mL Intravenous Q12H  . topiramate  200 mg Oral QHS   Continuous Infusions: . sodium chloride 75 mL/hr at 03/13/14 1526     Orvil Faraone, DO  [facility] Pager [phone]  If 7PM-7AM, please contact night-coverage [email or web address] Password [email or web address] 03/13/2014, 5:28 PM   LOS: 2 days

## 2014-03-14 DIAGNOSIS — K921 Melena: Secondary | ICD-10-CM

## 2014-03-14 DIAGNOSIS — W19XXXD Unspecified fall, subsequent encounter: Secondary | ICD-10-CM

## 2014-03-14 LAB — BASIC METABOLIC PANEL
ANION GAP: 6 (ref 5–15)
BUN: 9 mg/dL (ref 6–23)
CHLORIDE: 99 mmol/L (ref 96–112)
CO2: 25 mmol/L (ref 19–32)
CREATININE: 0.7 mg/dL (ref 0.50–1.10)
Calcium: 8.3 mg/dL — ABNORMAL LOW (ref 8.4–10.5)
GFR calc Af Amer: >90 mL/min (ref >90)
GFR calc non Af Amer: >90 mL/min (ref >90)
GLUCOSE: 84 mg/dL (ref 70–99)
Potassium: 4 mmol/L (ref 3.5–5.1)
Sodium: 130 mmol/L — ABNORMAL LOW (ref 135–145)

## 2014-03-14 LAB — CBC
HCT: 32.7 % — ABNORMAL LOW (ref 36.0–46.0)
HEMOGLOBIN: 10.7 g/dL — AB (ref 12.0–15.0)
MCH: 31.3 pg (ref 26.0–34.0)
MCHC: 32.7 g/dL (ref 30.0–36.0)
MCV: 95.6 fL (ref 78.0–100.0)
Platelets: 208 10*3/uL (ref 150–400)
RBC: 3.42 MIL/uL — ABNORMAL LOW (ref 3.87–5.11)
RDW: 12.8 % (ref 11.5–15.5)
WBC: 3.8 10*3/uL — ABNORMAL LOW (ref 4.0–10.5)

## 2014-03-14 LAB — FERRITIN: Ferritin: 446 ng/mL — ABNORMAL HIGH (ref 10–291)

## 2014-03-14 LAB — APTT: APTT: 37 s (ref 24–37)

## 2014-03-14 LAB — PROTIME-INR
INR: 1.02 (ref 0.00–1.49)
Prothrombin Time: 13.5 seconds (ref 11.6–15.2)

## 2014-03-14 LAB — IRON AND TIBC
Iron: 62 ug/dL (ref 42–145)
Saturation Ratios: 24 % (ref 20–55)
TIBC: 255 ug/dL (ref 250–470)
UIBC: 193 ug/dL (ref 125–400)

## 2014-03-14 MED ORDER — SUMATRIPTAN SUCCINATE 50 MG PO TABS
50.0000 mg | ORAL_TABLET | Freq: Once | ORAL | Status: DC
  Filled 2014-03-14: qty 1

## 2014-03-14 MED ORDER — ALPRAZOLAM 1 MG PO TABS
1.0000 mg | ORAL_TABLET | Freq: Once | ORAL | Status: AC
  Administered 2014-03-14: 1 mg via ORAL
  Filled 2014-03-14: qty 1

## 2014-03-14 NOTE — Discharge Summary (Signed)
Physician Discharge Summary  Valerie Silva AVW:[id number] DOB: [birth year] DOA: 03/10/2014  PCP: [name], MD  Admit date: 03/10/2014 Discharge date: 03/14/2014  Recommendations for Outpatient Follow-up:  1. Pt will need to follow up with PCP in 2 weeks post discharge 2. Please obtain BMP in one week  Discharge Diagnoses:  Severe hyponatremia -dehydration, excess free water intake, Paxil, SIADH,and poor solute intake due to lack of teeth -Improving slowly, continue IV fluids. -TSH within normal limits  - urine osm--243, serum osm--254  -UNa, Ucreatinine-->FeNa--0.9% -patient's last admission cosyntropin test was normal (11/02/13) -fluid restrict po fluid intake -serum Na--130 on day of d/c -on previous admission in October 2015, the patient required NaCl tabs--she did not require this during this admission, but her low solute intake is due to her edentulous status -discussed nutrition with patient  -Sodium improved with intravenous NS and po free water restriction -patient also has had previous hospitalizations from 10/04/2013-10/08/2013 as well as from 10/31/2013-11/04/2013 for severe hyponatremia.   Frequent falls -Imaging of the head, neck, maxillofacial and pelvis x-rays were all negative -PT consulted-->HHPT  Hypothyroidism -TSH within normal limits, continue Synthroid  Chronic pain -Continue home regimen--OxyContin and percocet  Iron deficiency and B-12 deficiency anemia -Continue supplementations  Bipolar disorder/depression/anxiety, stable. She continued her Xanax, Provigil. Recommended she have her paxil dose decreased slightly in taper off if possible under supervising physician.  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  The patient presented with frequent falls and hyponatremia with a serum sodium of 116. The patient also has had previous hospitalizations from 10/04/2013-10/08/2013 as well as from 10/31/2013-11/04/2013 for severe hyponatremia. The patient was started on intravenous fluids, and her free water intake was restricted. Her serum sodium slowly improved. The patient's symptoms improved. Physical therapy worked with the patient and recommended home health physical therapy which was set up with assistance of case management. The patient had opioid seeking behavior. I told the patient that I will not be prescribing any opioids at the time of discharge. The patient wanted to follow up with her psychiatrist regarding possible switch off of SSRIs.   Discharge Exam: Filed Vitals:   03/14/14 0837  BP: 91/61  Pulse: 71  Temp: 98.2 F (36.8 C)  Resp: 18   Filed Vitals:   03/13/14 2036 03/14/14 0540 03/14/14 0555 03/14/14 0837  BP: 94/54 102/61  91/61  Pulse: 66 65  71  Temp: 97.7 F (36.5 C) 97.4 F (36.3 C)  98.2 F (36.8 C)  TempSrc: Oral Oral  Oral  Resp: 16 14  18   Height:      Weight:   95.255 kg (210 lb)   SpO2: 96% 97%  98%   General: A&O x 3, NAD, pleasant, cooperative Cardiovascular: RRR, no rub, no gallop, no S3 Respiratory: CTAB, no wheeze, no rhonchi Abdomen:soft, nontender, nondistended, positive bowel sounds Extremities: 1+LE edema, No lymphangitis, no petechiae  Discharge Instructions  Discharge Instructions    Diet - low sodium heart healthy    Complete by:  As directed      Increase activity slowly     Complete by:  As directed             Medication List    STOP taking these medications        escitalopram 20 MG tablet  Commonly known as:  LEXAPRO     ibuprofen 800 MG tablet  Commonly known as:  ADVIL,MOTRIN     oxyCODONE-acetaminophen 10-325 MG per tablet  Commonly known as:  PERCOCET     potassium chloride SA 20 MEQ tablet  Commonly known as:  K-DUR,KLOR-CON     triamterene-hydrochlorothiazide 75-50 MG per tablet  Commonly known as:  MAXZIDE      TAKE these medications        acarbose 25 MG tablet  Commonly known as:  PRECOSE  Take 25 mg by mouth daily as needed (for low blood sugar).     alprazolam 2 MG tablet  Commonly known as:  XANAX  Take two tablets by mouth twice daily as needed for anxiety; Take two tablets by mouth at bedtime as needed for sleep or anxiety     cyclobenzaprine 10 MG tablet  Commonly known as:  FLEXERIL  Take 1 tablet (10 mg total) by mouth 3 (three) times daily.     diclofenac 75 MG EC tablet  Commonly known as:  VOLTAREN  Take 75 mg by mouth 2 (two) times daily.     DSS 100 MG Caps  Take 100 mg by mouth 2 (two) times daily.     levothyroxine 112 MCG tablet  Commonly known as:  SYNTHROID, LEVOTHROID  Take 112-224 mcg by mouth daily before breakfast. Pt takes 224mcg every day, but on Monday she takes 112 mcg.     loratadine 10 MG tablet  Commonly known as:  CLARITIN  Take 1 tablet (10 mg total) by mouth 2 (two) times daily.     Magnesium 250 MG Tabs  Take 1 tablet by mouth daily.     modafinil 200 MG tablet  Commonly known as:  PROVIGIL  Take 2 tablets (400 mg total) by mouth every morning.     mometasone 50 MCG/ACT nasal spray  Commonly known as:  NASONEX  Place 2 sprays into the nose daily.     omeprazole 40 MG capsule  Commonly known as:  PRILOSEC  Take 1 capsule (40 mg total) by mouth daily.     OxyCODONE HCl ER 30 MG T12a  Commonly known as:  OXYCONTIN  Take 30 mg by mouth 3 (three) times daily.      promethazine 25 MG tablet  Commonly known as:  PHENERGAN  Take 25 mg by mouth every 8 (eight) hours as needed for nausea.     SUMAtriptan 100 MG tablet  Commonly known as:  IMITREX  Take 100 mg by mouth every 2 (two) hours as needed for migraine.     topiramate 200 MG tablet  Commonly known as:  TOPAMAX  Take 1 tablet (200 mg total) by mouth at bedtime.     valACYclovir 1000 MG tablet  Commonly known as:  VALTREX  Take 1,000 mg by mouth daily as needed (for flare ups).     VITAMIN B-12 IJ  Inject as directed every 28 (twenty-eight) days.     Vitamin D-3 5000 UNITS Tabs  Take 2 tablets by mouth daily.  The results of significant diagnostics from this hospitalization (including imaging, microbiology, ancillary and laboratory) are listed below for reference.    Significant Diagnostic Studies: Dg Chest 2 View  03/11/2014   CLINICAL DATA:  Acute onset of syncope. Status post multiple falls today. Initial encounter.  EXAM: CHEST  2 VIEW  COMPARISON:  Chest radiograph performed 02/14/2014  FINDINGS: The lungs are hypoexpanded. Mild vascular crowding is noted. No focal consolidation, pleural effusion or pneumothorax is seen.  The cardiomediastinal silhouette is borderline normal in size. No acute osseous abnormalities are identified. Cervical spinal fusion hardware is noted.  IMPRESSION: Lungs hypoexpanded but grossly clear. No displaced rib fracture seen.   Electronically Signed   By: Roanna Raider M.D.   On: 03/11/2014 00:45   Dg Chest 2 View  02/14/2014   CLINICAL DATA:  54 year old female with to falls. Left posterior rib pain, shortness of breath. Initial encounter.  EXAM: CHEST  2 VIEW  COMPARISON:  10/31/2013 and earlier.  FINDINGS: Normal lung volumes. Normal cardiac size and mediastinal contours. Visualized tracheal air column is within normal limits. No pneumothorax or pleural effusion. The lungs are clear. Cervical ACDF hardware and cholecystectomy clips re- identified.  Lower thoracic or upper lumbar compression fractures previously augmented re - identified. No acute osseous abnormality identified.  IMPRESSION: No acute cardiopulmonary abnormality or acute traumatic injury identified.   Electronically Signed   By: Augusto Gamble M.D.   On: 02/14/2014 14:14   Dg Ribs Unilateral Left  02/14/2014   CLINICAL DATA:  54 year old female with to falls. Left posterior rib pain, shortness of breath. Initial encounter.  EXAM: LEFT RIBS - 2 VIEW  COMPARISON:  Two-view chest radiographs from today reported separately.  FINDINGS: Rib marker placed at the posterior left eleventh/twelfth rib level. Large body habitus mildly degrades the lower rib bone detail. Left upper quadrant surgical clips re- identified. Augmented lumbar compression fractures re - identified. No displaced left rib fracture identified. No acute osseous abnormality identified.  IMPRESSION: No displaced left rib fracture identified.   Electronically Signed   By: Augusto Gamble M.D.   On: 02/14/2014 14:15   Dg Pelvis 1-2 Views  03/11/2014   CLINICAL DATA:  Status post multiple recent falls. Pelvic and coccygeal pain. Initial encounter.  EXAM: PELVIS - 1-2 VIEW  COMPARISON:  Lumbar spine radiographs performed 05/03/2013  FINDINGS: There is no evidence of fracture or dislocation. Both femoral heads are seated normally within their respective acetabula. No significant degenerative change is appreciated. The sacroiliac joints are unremarkable in appearance.  The visualized bowel gas pattern is grossly unremarkable in appearance. Scattered phleboliths are noted within the pelvis. Scattered clips are seen about the pelvis.  IMPRESSION: No evidence of fracture or dislocation.   Electronically Signed   By: Roanna Raider M.D.   On: 03/11/2014 02:31   Ct Head Wo Contrast  03/11/2014   CLINICAL DATA:  Worsening generalized weakness and recurrent falls. Weakness for 5 weeks. Slowed speech. Concern for maxillofacial or cervical spine injury.  Initial encounter.  EXAM: CT HEAD WITHOUT CONTRAST  CT MAXILLOFACIAL WITHOUT CONTRAST  CT CERVICAL SPINE WITHOUT CONTRAST  TECHNIQUE: Multidetector CT imaging of the head, cervical spine, and maxillofacial structures were performed using the standard protocol without intravenous contrast. Multiplanar CT image reconstructions of the cervical spine and maxillofacial structures were also generated.  COMPARISON:  CT of the head performed 10/31/2013, CT of the cervical spine performed 05/03/2013, and MRI of the brain performed 09/20/2011  FINDINGS: CT HEAD FINDINGS  There is no evidence of acute infarction, mass lesion, or intra- or extra-axial hemorrhage on CT.  The posterior fossa, including the cerebellum, brainstem and fourth ventricle, is within normal limits. The third and lateral ventricles, and basal ganglia are unremarkable in appearance. The cerebral hemispheres are symmetric in appearance, with normal gray-white differentiation. No mass effect or midline shift is seen.  There is no evidence of fracture; visualized osseous structures are unremarkable in appearance. The orbits are within normal limits. The paranasal sinuses and mastoid air cells are well-aerated. No significant soft tissue abnormalities are seen.  CT MAXILLOFACIAL FINDINGS  There is no evidence of fracture or dislocation. The maxilla and mandible appear intact. Postoperative change is noted at the central mandible. The nasal bone is unremarkable in appearance. There is chronic complete absence of the dentition.  The orbits are intact bilaterally. The visualized paranasal sinuses and mastoid air cells are well-aerated.  No significant soft tissue abnormalities are seen. The parapharyngeal fat planes are preserved. The nasopharynx, oropharynx and hypopharynx are unremarkable in appearance. The visualized portions of the valleculae and piriform sinuses are grossly unremarkable.  The parotid and submandibular glands are within normal limits. No  cervical lymphadenopathy is seen.  CT CERVICAL SPINE FINDINGS  There is no evidence of fracture or subluxation. The patient is status post anterior cervical spinal fusion at C4-C7. Vertebral bodies demonstrate normal height and alignment. Prevertebral soft tissues are within normal limits.  The thyroid gland is unremarkable in appearance. Minimal interstitial prominence at the lung apices is likely transient in nature. No significant soft tissue abnormalities are seen.  IMPRESSION: 1. No evidence of traumatic intracranial injury or fracture. 2. No evidence of fracture or dislocation with regard to the maxillofacial structures. 3. No evidence of fracture or subluxation along the cervical spine. 4. Status post anterior cervical spinal fusion at C4-C7.   Electronically Signed   By: Roanna Raider M.D.   On: 03/11/2014 02:59   Ct Cervical Spine Wo Contrast  03/11/2014   CLINICAL DATA:  Worsening generalized weakness and recurrent falls. Weakness for 5 weeks. Slowed speech. Concern for maxillofacial or cervical spine injury. Initial encounter.  EXAM: CT HEAD WITHOUT CONTRAST  CT MAXILLOFACIAL WITHOUT CONTRAST  CT CERVICAL SPINE WITHOUT CONTRAST  TECHNIQUE: Multidetector CT imaging of the head, cervical spine, and maxillofacial structures were performed using the standard protocol without intravenous contrast. Multiplanar CT image reconstructions of the cervical spine and maxillofacial structures were also generated.  COMPARISON:  CT of the head performed 10/31/2013, CT of the cervical spine performed 05/03/2013, and MRI of the brain performed 09/20/2011  FINDINGS: CT HEAD FINDINGS  There is no evidence of acute infarction, mass lesion, or intra- or extra-axial hemorrhage on CT.  The posterior fossa, including the cerebellum, brainstem and fourth ventricle, is within normal limits. The third and lateral ventricles, and basal ganglia are unremarkable in appearance. The cerebral hemispheres are symmetric in appearance,  with normal gray-white differentiation. No mass effect or midline shift is seen.  There is no evidence of fracture; visualized osseous structures are unremarkable in appearance. The orbits are within normal limits. The paranasal sinuses and mastoid air cells are well-aerated. No significant soft tissue abnormalities are seen.  CT MAXILLOFACIAL FINDINGS  There is no evidence of fracture or dislocation. The maxilla and mandible appear intact. Postoperative change is noted at the central mandible. The nasal bone is unremarkable in appearance. There is chronic complete absence of the dentition.  The orbits are intact bilaterally. The visualized paranasal  sinuses and mastoid air cells are well-aerated.  No significant soft tissue abnormalities are seen. The parapharyngeal fat planes are preserved. The nasopharynx, oropharynx and hypopharynx are unremarkable in appearance. The visualized portions of the valleculae and piriform sinuses are grossly unremarkable.  The parotid and submandibular glands are within normal limits. No cervical lymphadenopathy is seen.  CT CERVICAL SPINE FINDINGS  There is no evidence of fracture or subluxation. The patient is status post anterior cervical spinal fusion at C4-C7. Vertebral bodies demonstrate normal height and alignment. Prevertebral soft tissues are within normal limits.  The thyroid gland is unremarkable in appearance. Minimal interstitial prominence at the lung apices is likely transient in nature. No significant soft tissue abnormalities are seen.  IMPRESSION: 1. No evidence of traumatic intracranial injury or fracture. 2. No evidence of fracture or dislocation with regard to the maxillofacial structures. 3. No evidence of fracture or subluxation along the cervical spine. 4. Status post anterior cervical spinal fusion at C4-C7.   Electronically Signed   By: Roanna Raider M.D.   On: 03/11/2014 02:59   Ct Maxillofacial Wo Cm  03/11/2014   CLINICAL DATA:  Worsening generalized  weakness and recurrent falls. Weakness for 5 weeks. Slowed speech. Concern for maxillofacial or cervical spine injury. Initial encounter.  EXAM: CT HEAD WITHOUT CONTRAST  CT MAXILLOFACIAL WITHOUT CONTRAST  CT CERVICAL SPINE WITHOUT CONTRAST  TECHNIQUE: Multidetector CT imaging of the head, cervical spine, and maxillofacial structures were performed using the standard protocol without intravenous contrast. Multiplanar CT image reconstructions of the cervical spine and maxillofacial structures were also generated.  COMPARISON:  CT of the head performed 10/31/2013, CT of the cervical spine performed 05/03/2013, and MRI of the brain performed 09/20/2011  FINDINGS: CT HEAD FINDINGS  There is no evidence of acute infarction, mass lesion, or intra- or extra-axial hemorrhage on CT.  The posterior fossa, including the cerebellum, brainstem and fourth ventricle, is within normal limits. The third and lateral ventricles, and basal ganglia are unremarkable in appearance. The cerebral hemispheres are symmetric in appearance, with normal gray-white differentiation. No mass effect or midline shift is seen.  There is no evidence of fracture; visualized osseous structures are unremarkable in appearance. The orbits are within normal limits. The paranasal sinuses and mastoid air cells are well-aerated. No significant soft tissue abnormalities are seen.  CT MAXILLOFACIAL FINDINGS  There is no evidence of fracture or dislocation. The maxilla and mandible appear intact. Postoperative change is noted at the central mandible. The nasal bone is unremarkable in appearance. There is chronic complete absence of the dentition.  The orbits are intact bilaterally. The visualized paranasal sinuses and mastoid air cells are well-aerated.  No significant soft tissue abnormalities are seen. The parapharyngeal fat planes are preserved. The nasopharynx, oropharynx and hypopharynx are unremarkable in appearance. The visualized portions of the valleculae  and piriform sinuses are grossly unremarkable.  The parotid and submandibular glands are within normal limits. No cervical lymphadenopathy is seen.  CT CERVICAL SPINE FINDINGS  There is no evidence of fracture or subluxation. The patient is status post anterior cervical spinal fusion at C4-C7. Vertebral bodies demonstrate normal height and alignment. Prevertebral soft tissues are within normal limits.  The thyroid gland is unremarkable in appearance. Minimal interstitial prominence at the lung apices is likely transient in nature. No significant soft tissue abnormalities are seen.  IMPRESSION: 1. No evidence of traumatic intracranial injury or fracture. 2. No evidence of fracture or dislocation with regard to the maxillofacial structures. 3. No evidence  of fracture or subluxation along the cervical spine. 4. Status post anterior cervical spinal fusion at C4-C7.   Electronically Signed   By: Roanna Raider M.D.   On: 03/11/2014 02:59     Microbiology: Recent Results (from the past 240 hour(s))  MRSA PCR Screening     Status: None   Collection Time: 03/11/14  4:29 AM  Result Value Ref Range Status   MRSA by PCR NEGATIVE NEGATIVE Final    Comment:        The GeneXpert MRSA Assay (FDA approved for NASAL specimens only), is one component of a comprehensive MRSA colonization surveillance program. It is not intended to diagnose MRSA infection nor to guide or monitor treatment for MRSA infections.      Labs: Basic Metabolic Panel:  Recent Labs Lab 03/10/14 2224  03/12/14 0517 03/12/14 1122 03/12/14 1629 03/13/14 0548 03/14/14 0535  NA 114*  < > 120* 122* 121* 123* 130*  K 3.6  < > 3.8 3.8 3.5 3.8 4.0  CL 81*  < > 87* 88* 92* 92* 99  CO2 25  < > 25 27 21 27 25   GLUCOSE 98  < > 81 89 100* 85 84  BUN 12  < > 6 7 9 11 9   CREATININE 0.65  < > 0.57 0.60 0.70 0.71 0.70  CALCIUM 8.1*  < > 7.8* 7.9* 7.9* 7.8* 8.3*  MG 1.6  --   --   --   --   --   --   < > = values in this interval not  displayed. Liver Function Tests:  Recent Labs Lab 03/10/14 2224  AST 26  ALT 25  ALKPHOS 51  BILITOT 0.7  PROT 6.5  ALBUMIN 3.7   No results for input(s): LIPASE, AMYLASE in the last 168 hours. No results for input(s): AMMONIA in the last 168 hours. CBC:  Recent Labs Lab 03/10/14 2224 03/11/14 0209 03/11/14 0605 03/12/14 0517  WBC 4.5  --  4.2 4.0  NEUTROABS  --   --  2.8  --   HGB 10.8* 14.3 10.2* 10.1*  HCT 31.2* 42.0 29.6* 29.8*  MCV 89.9  --  89.7 91.1  PLT 236  --  213 187   Cardiac Enzymes: No results for input(s): CKTOTAL, CKMB, CKMBINDEX, TROPONINI in the last 168 hours. BNP: Invalid input(s): POCBNP CBG: No results for input(s): GLUCAP in the last 168 hours.  Time coordinating discharge:  Greater than 30 minutes  Signed:  Torry Adamczak, DO [facility] Pager: [phone] 03/14/2014, 9:50 AM

## 2014-03-14 NOTE — Progress Notes (Signed)
CARE MANAGEMENT NOTE 03/14/2014  Patient:  Valerie Silva,Valerie Silva   Account Number:  [id number]  Date Initiated:  03/11/2014  Documentation initiated by:  DAVIS,RHONDA  Subjective/Objective Assessment:   pt with hyponatermia and falls with hypotension.     Action/Plan:   hgome when stable   Anticipated DC Date:  03/14/2014   Anticipated DC Plan:  HOME/SELF CARE  In-house referral  NA      DC Planning Services  CM consult      PAC Choice  NA   Choice offered to / List presented to:             Status of service:  In process, will continue to follow Medicare Important Message given?   (If response is "NO", the following Medicare IM given date fields will be blank) Date Medicare IM given:   Medicare IM given by:   Date Additional Medicare IM given:   Additional Medicare IM given by:    Discharge Disposition:  HOME/SELF CARE  Per UR Regulation:  Reviewed for med. necessity/level of care/duration of stay  If discussed at Long Length of Stay Meetings, dates discussed:    Comments:  03/14/14 MMcGibboney, RN, BSN Pt declined HH at present time.   March 11, 2014/Rhonda L. Earlene Plateravis, RN, BSN, CCM. Case Management [place] [facility] [phone] No discharge needs present of time of review.

## 2014-03-14 NOTE — Progress Notes (Addendum)
Notified by RN that pt had BM with large amount of bright red blood Cancel discharge Order STAT CBC Order coags and iron studies May need GI consult pending lab data. VSS  DTat  Addendum at 1150 -Hgb 10.7, Coags WNL -consulted GI, Dr. Elnoria HowardHung to see pt as pt has followed his practice in the past.  DTat

## 2014-03-14 NOTE — Progress Notes (Signed)
Physical Therapy Treatment Patient Details Name: Valerie Silva MRN: [id number] DOB: 1960-08-24 Today's Date: 03/14/2014    History of Present Illness 54 yo female admitted with hyponatremia, frequent falls. hx of bipolar d/o, fibromyalgia, chronic pain, osteoporosis, DM, anxiety    PT Comments    Pt was suppose to D/C to home earlier but had an issue of bright red blood in her stools.  Assisted OOB to BR then amb in hallway.  Tolerated well. No LOB.  Good steady gait just slightly sluggish.    Follow Up Recommendations  Home health PT     Equipment Recommendations  None recommended by PT    Recommendations for Other Services       Precautions / Restrictions Restrictions Weight Bearing Restrictions: No    Mobility  Bed Mobility Overal bed mobility: Modified Independent                Transfers Overall transfer level: Modified independent                  Ambulation/Gait Ambulation/Gait assistance: Supervision Ambulation Distance (Feet): 275 Feet Assistive device: None Gait Pattern/deviations: Step-through pattern Gait velocity: WWFL   General Gait Details: slightly sluggish gait with some c/o back pain.   Stairs            Wheelchair Mobility    Modified Rankin (Stroke Patients Only)       Balance                                    Cognition Arousal/Alertness: Awake/alert Behavior During Therapy: WFL for tasks assessed/performed (slightly groggy)                        Exercises      General Comments        Pertinent Vitals/Pain Pain Assessment: Faces Faces Pain Scale: Hurts even more Pain Location: chronic back pain Pain Descriptors / Indicators: Aching Pain Intervention(s): Monitored during session    Home Living                      Prior Function            PT Goals (current goals can now be found in the care plan section) Progress towards PT goals: Progressing toward goals     Frequency  Min 3X/week    PT Plan      Co-evaluation             End of Session Equipment Utilized During Treatment: Gait belt Activity Tolerance: Patient tolerated treatment well Patient left: in bed;with call bell/phone within reach     Time: 1420-1434 PT Time Calculation (min) (ACUTE ONLY): 14 min  Charges:  $Gait Training: 8-22 mins                    G Codes:      Felecia ShellingLori Sumie Remsen  PTA [facility]  Acute  Rehab Pager      [phone]

## 2014-03-14 NOTE — Consult Note (Signed)
Reason for Consult: Hematochezia Referring Physician: [facility]  [name] HPI: This is a 54 year old female with a history of GI bleed and anemia who reports having hematochezia just prior to discharge. She was admitted for hyponatremia and subsequently this issue was improved.  Unfortunately she had a large bout of hematochezia when she was being discharged.  No reports of abdominal pain before the onset of the bloody bowel movement.  In 2007 the patient was admitted for hematochezia and melena, but no source was identified with the EGD/Colonoscopy.  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  Over the years she has had minor hematochezia and she receives iron infusion every 3-6 month.  The patient was referred to [facility] for a double balloon enteroscopy, but I do not have the results and she cannot recall the findings.  Past Medical History  Diagnosis Date  . Osteoporosis   . Hypothyroidism   . Diabetes mellitus   . Exogenous obesity   . Rhinitis   . Arthritis   . Fibromyalgia   . Somnolence   . Sleep apnea   . Anemia, iron deficiency 04/06/2011  . Pernicious anemia 04/06/2011  . Depression   . Anxiety   . Iron deficiency anemia, unspecified 12/12/2012  . Iron deficiency anemia due to chronic blood loss 01/15/2014    Past Surgical History  Procedure Laterality Date  . Gastric bypass    . Cholecystectomy    . Tracheostomy    . Uvulectomy    . Tonsillectomy    . Mandible reconstruction    . Spinal fusion    . Dilation and curettage of uterus    . Cesarean section      Family History  Problem Relation Age of Onset  . CAD Father   . Diabetes Father   . CAD Brother      X3  . Hypertension Brother   . Colon cancer Brother     Social History:  reports that she has never smoked. She has never used smokeless tobacco. She reports that she drinks alcohol. She  reports that she does not use illicit drugs.  Allergies:  Allergies  Allergen Reactions  . Iodine Other (See Comments)    Sensitive when used gynecologically (intra-vaginally).  . Iron Itching    Iron taken by mouth causes this reaction. The patient has had several iron infusions plus Feraheme that has not caused any side effects.   . Lovaza [Omega-3-Acid Ethyl Esters] Itching  . Soma [Carisoprodol] Other (See Comments)    Pt states that this medication causes panic attacks.   . Sulfonamide Derivatives Other (See Comments)    Sensitive when used intra-vaginally.  . Benazepril Hcl Other (See Comments)    Reaction:  Severe cough   . Codeine Other (See Comments)    Pt states that this medication causes rapid heartbeat.   Marland Kitchen Penicillins Other (See Comments)    Reaction: Severe family allergy.  Family physician long ago stated she shouldn't take this med since mother and brother nearly died from anaphylaxis.  Marland Kitchen Zolpidem Tartrate Itching and Other (See Comments)    Pt states that this medication causes blackouts.   . Amitriptyline Other (See Comments)    Pt states that this medication causes sleep walking.     Medications:  Scheduled: . cyclobenzaprine  10 mg Oral TID  . fluticasone  2 spray Each Nare  Daily  . [START ON 03/17/2014] levothyroxine  112 mcg Oral Once per day on Mon  . levothyroxine  224 mcg Oral Once per day on Sun Tue Wed Thu Fri Sat  . loratadine  10 mg Oral BID  . modafinil  400 mg Oral q morning - 10a  . OxyCODONE  30 mg Oral 3 times per day  . pantoprazole  40 mg Oral Daily  . sodium chloride  3 mL Intravenous Q12H  . topiramate  200 mg Oral QHS   Continuous: . sodium chloride 75 mL/hr at 03/14/14 0545    Results for orders placed or performed during the hospital encounter of 03/10/14 (from the past 24 hour(s))  Basic metabolic panel     Status: Abnormal   Collection Time: 03/14/14  5:35 AM  Result Value Ref Range   Sodium 130 (L) 135 - 145 mmol/L   Potassium  4.0 3.5 - 5.1 mmol/L   Chloride 99 96 - 112 mmol/L   CO2 25 19 - 32 mmol/L   Glucose, Bld 84 70 - 99 mg/dL   BUN 9 6 - 23 mg/dL   Creatinine, Ser 5.620.70 0.50 - 1.10 mg/dL   Calcium 8.3 (L) 8.4 - 10.5 mg/dL   GFR calc non Af Amer >90 >90 mL/min   GFR calc Af Amer >90 >90 mL/min   Anion gap 6 5 - 15  CBC     Status: Abnormal   Collection Time: 03/14/14 10:52 AM  Result Value Ref Range   WBC 3.8 (L) 4.0 - 10.5 K/uL   RBC 3.42 (L) 3.87 - 5.11 MIL/uL   Hemoglobin 10.7 (L) 12.0 - 15.0 g/dL   HCT 13.032.7 (L) 86.536.0 - 78.446.0 %   MCV 95.6 78.0 - 100.0 fL   MCH 31.3 26.0 - 34.0 pg   MCHC 32.7 30.0 - 36.0 g/dL   RDW 69.612.8 29.511.5 - 28.415.5 %   Platelets 208 150 - 400 K/uL  Protime-INR     Status: None   Collection Time: 03/14/14 10:56 AM  Result Value Ref Range   Prothrombin Time 13.5 11.6 - 15.2 seconds   INR 1.02 0.00 - 1.49  APTT     Status: None   Collection Time: 03/14/14 10:56 AM  Result Value Ref Range   aPTT 37 24 - 37 seconds     No results found.  ROS:  As stated above in the HPI otherwise negative.  Blood pressure 91/61, pulse 71, temperature 98.2 F (36.8 C), temperature source Oral, resp. rate 18, height 5\' 8"  (1.727 m), weight 95.255 kg (210 lb), SpO2 98 %.    PE: Gen: NAD, Alert and Oriented HEENT:  [place]/AT, EOMI Neck: Supple, no LAD Lungs: CTA Bilaterally CV: RRR without M/G/R ABM: Soft, NTND, +BS Ext: No C/C/E Rectal: Minor blood on the examination glove, no masses  Assessment/Plan: 1) Hematochezia. 2) Anemia.   She has had work up in the past for hematochezia.  The exact site was not determined.  No history of diverticula.  Currently she is stable and she denies any further bleeding.  Since this is not a new issue I think she can be monitored.  If no further bleeding overnight and HGB is stable, she can be discharged with close outpatient follow with Dr. Loreta AveMann.  Plan: 1) Monitor HGB. Jaliel Deavers D 03/14/2014, 1:09 PM

## 2014-03-15 LAB — BASIC METABOLIC PANEL
Anion gap: 5 (ref 5–15)
BUN: 8 mg/dL (ref 6–23)
CHLORIDE: 105 mmol/L (ref 96–112)
CO2: 24 mmol/L (ref 19–32)
Calcium: 8.2 mg/dL — ABNORMAL LOW (ref 8.4–10.5)
Creatinine, Ser: 0.73 mg/dL (ref 0.50–1.10)
GFR calc Af Amer: >90 mL/min (ref >90)
Glucose, Bld: 80 mg/dL (ref 70–99)
Potassium: 4.2 mmol/L (ref 3.5–5.1)
SODIUM: 134 mmol/L — AB (ref 135–145)

## 2014-03-15 NOTE — Progress Notes (Signed)
Pt has been discharged and awaiting her ride. Denies any needs at this time will continue to monitor.

## 2014-03-15 NOTE — Discharge Planning (Signed)
Physician Discharge Summary  Valerie Silva:[id number] DOB: 08-Jun-1960 DOA: 03/10/2014  PCP: [name], MD  Admit date: 03/10/2014 Discharge date: 03/15/2014  Recommendations for Outpatient Follow-up:  1. Pt will need to follow up with PCP 03/14/14 post discharge 2. Please obtain BMP and CBC in one week  Discharge Diagnoses:  Severe hyponatremia -dehydration, excess free water intake, Paxil, SIADH,and poor solute intake due to lack of teeth -Improving slowly, continue IV fluids. -TSH within normal limits  - urine osm--243, serum osm--254  -UNa, Ucreatinine-->FeNa--0.9% -patient's last admission cosyntropin test was normal (11/02/13) -fluid restrict po fluid intake -serum Na--134 on day of d/c -on previous admission in October 2015, the patient required NaCl tabs--she did not require this during this admission, but her low solute intake is due to her edentulous status -discussed nutrition with patient  -Sodium improved with intravenous NS and po free water restriction -patient also has had previous hospitalizations from 10/04/2013-10/08/2013 as well as from 10/31/2013-11/04/2013 for severe hyponatremia.   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 Hung-->no intervention needed during hospitalization as Hgb remained stable throughout hospitalization -f/u in office with Dr. Elnoria Howard in 2 weeks -Hgb 10.7 on day of d/c  Frequent falls -Imaging of the head, neck, maxillofacial and pelvis x-rays were all negative -PT  consulted-->HHPT-->patient refused  Hypothyroidism -TSH within normal limits, continue Synthroid  Chronic pain -Continue home regimen--OxyContin and percocet  Iron deficiency and B-12 deficiency anemia -Continue supplementations  Bipolar disorder/depression/anxiety, stable. She continued her Xanax, Provigil. Recommended she have her paxil dose decreased slightly in taper off if possible under supervising physician.  Chronic pain and opioid use -pt has opioid seeking behavior -I told pt I will not prescribe any opioids for her at the time of d/c--she will need to follow up with her usual provider who was previously prescribing the opioids  Discharge Condition: stable  Disposition: home Follow-up Information    Follow up with [name], MD. Go on 03/24/2014.   Specialty:  Family Medicine   Why:  at 4:15pm.  Post Hospitalization and Hyponatremia   Contact information:   [address], [address] [place] [address] [phone]       Follow up with Theda Belfast, MD In 2 weeks.   Specialty:  Gastroenterology   Contact information:   [address] 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 The patient presented with frequent falls and hyponatremia with a serum sodium of 116. The patient also has had previous hospitalizations from 10/04/2013-10/08/2013 as well as from 10/31/2013-11/04/2013 for severe hyponatremia. The patient was started on intravenous fluids, and her free water intake was restricted. Her serum sodium slowly improved. The patient's symptoms improved. Physical therapy  worked with the patient  and recommended home health physical therapy which was set up with assistance of case management. The patient had opioid seeking behavior. I told the patient that I will not be prescribing any opioids at the time of discharge. The patient wanted to follow up with her psychiatrist regarding possible switch off of SSRIs.  Consultants: GI--Dr. Elnoria Howard  Discharge Exam: Filed Vitals:   03/15/14 0617  BP: 118/73  Pulse: 63  Temp: 97.3 F (36.3 C)  Resp: 16   Filed Vitals:   03/14/14 0837 03/14/14 1440 03/14/14 2018 03/15/14 0617  BP: 91/61 115/78 98/63 118/73  Pulse: 71 82 70 63  Temp: 98.2 F (36.8 C) 98 F (36.7 C) 98.5 F (36.9 C) 97.3 F (36.3 C)  TempSrc: Oral Oral Oral Oral  Resp: Height:      Weight:    94.53 kg (208 lb 6.4 oz)  SpO2: 98% 98% 94% 100%   General: A&O x 3, NAD, pleasant, cooperative Cardiovascular: RRR, no rub, no gallop, no S3 Respiratory: CTAB, no wheeze, no rhonchi Abdomen:soft, nontender, nondistended, positive bowel sounds Extremities: 1+LE edema, No lymphangitis, no petechiae  Discharge Instructions      Discharge Instructions    Diet - low sodium heart healthy    Complete by:  As directed      Diet - low sodium heart healthy    Complete by:  As directed      Diet general    Complete by:  As directed      Increase activity slowly    Complete by:  As directed      Increase activity slowly    Complete by:  As directed             Medication List    STOP taking these medications        escitalopram 20 MG tablet  Commonly known as:  LEXAPRO     ibuprofen 800 MG tablet  Commonly known as:  ADVIL,MOTRIN     oxyCODONE-acetaminophen 10-325 MG per tablet  Commonly known as:  PERCOCET     potassium chloride SA 20 MEQ tablet  Commonly known as:  K-DUR,KLOR-CON     triamterene-hydrochlorothiazide 75-50 MG per tablet  Commonly known as:  MAXZIDE      TAKE these medications        acarbose 25 MG  tablet  Commonly known as:  PRECOSE  Take 25 mg by mouth daily as needed (for low blood sugar).     alprazolam 2 MG tablet  Commonly known as:  XANAX  Take two tablets by mouth twice daily as needed for anxiety; Take two tablets by mouth at bedtime as needed for sleep or anxiety     cyclobenzaprine 10 MG tablet  Commonly known as:  FLEXERIL  Take 1 tablet (10 mg total) by mouth 3 (three) times daily.     diclofenac 75 MG EC tablet  Commonly known as:  VOLTAREN  Take 75 mg by mouth 2 (two) times daily.     DSS 100 MG Caps  Take 100 mg by mouth 2 (two) times daily.     levothyroxine 112 MCG tablet  Commonly known as:  SYNTHROID, LEVOTHROID  Take 112-224 mcg by mouth daily before breakfast. Pt takes every day, but on Monday she takes 112 mcg.     loratadine 10 MG tablet  Commonly known as:  CLARITIN  Take 1 tablet (10 mg total) by mouth 2 (two) times daily.  Magnesium 250 MG Tabs  Take 1 tablet by mouth daily.     modafinil 200 MG tablet  Commonly known as:  PROVIGIL  Take 2 tablets (400 mg total) by mouth every morning.     mometasone 50 MCG/ACT nasal spray  Commonly known as:  NASONEX  Place 2 sprays into the nose daily.     omeprazole 40 MG capsule  Commonly known as:  PRILOSEC  Take 1 capsule (40 mg total) by mouth daily.     OxyCODONE HCl ER 30 MG T12a  Commonly known as:  OXYCONTIN  Take 30 mg by mouth 3 (three) times daily.     promethazine 25 MG tablet  Commonly known as:  PHENERGAN  Take 25 mg by mouth every 8 (eight) hours as needed for nausea.     SUMAtriptan 100 MG tablet  Commonly known as:  IMITREX  Take 100 mg by mouth every 2 (two) hours as needed for migraine.     topiramate 200 MG tablet  Commonly known as:  TOPAMAX  Take 1 tablet (200 mg total) by mouth at bedtime.     valACYclovir 1000 MG tablet  Commonly known as:  VALTREX  Take 1,000 mg by mouth daily as needed (for flare ups).     VITAMIN B-12 IJ  Inject as directed every 28  (twenty-eight) days.     Vitamin D-3 5000 UNITS Tabs  Take 2 tablets by mouth daily.         The results of significant diagnostics from this hospitalization (including imaging, microbiology, ancillary and laboratory) are listed below for reference.    Significant Diagnostic Studies: Dg Chest 2 View  03/11/2014   CLINICAL DATA:  Acute onset of syncope. Status post multiple falls today. Initial encounter.  EXAM: CHEST  2 VIEW  COMPARISON:  Chest radiograph performed 02/14/2014  FINDINGS: The lungs are hypoexpanded. Mild vascular crowding is noted. No focal consolidation, pleural effusion or pneumothorax is seen.  The cardiomediastinal silhouette is borderline normal in size. No acute osseous abnormalities are identified. Cervical spinal fusion hardware is noted.  IMPRESSION: Lungs hypoexpanded but grossly clear. No displaced rib fracture seen.   Electronically Signed   By: Roanna Raider M.D.   On: 03/11/2014 00:45   Dg Chest 2 View  02/14/2014   CLINICAL DATA:  54 year old female with to falls. Left posterior rib pain, shortness of breath. Initial encounter.  EXAM: CHEST  2 VIEW  COMPARISON:  10/31/2013 and earlier.  FINDINGS: Normal lung volumes. Normal cardiac size and mediastinal contours. Visualized tracheal air column is within normal limits. No pneumothorax or pleural effusion. The lungs are clear. Cervical ACDF hardware and cholecystectomy clips re- identified. Lower thoracic or upper lumbar compression fractures previously augmented re - identified. No acute osseous abnormality identified.  IMPRESSION: No acute cardiopulmonary abnormality or acute traumatic injury identified.   Electronically Signed   By: Augusto Gamble M.D.   On: 02/14/2014 14:14   Dg Ribs Unilateral Left  02/14/2014   CLINICAL DATA:  54 year old female with to falls. Left posterior rib pain, shortness of breath. Initial encounter.  EXAM: LEFT RIBS - 2 VIEW  COMPARISON:  Two-view chest radiographs from today reported separately.   FINDINGS: Rib marker placed at the posterior left eleventh/twelfth rib level. Large body habitus mildly degrades the lower rib bone detail. Left upper quadrant surgical clips re- identified. Augmented lumbar compression fractures re - identified. No displaced left rib fracture identified. No acute osseous abnormality identified.  IMPRESSION: No displaced  left rib fracture identified.   Electronically Signed   By: Augusto Gamble M.D.   On: 02/14/2014 14:15   Dg Pelvis 1-2 Views  03/11/2014   CLINICAL DATA:  Status post multiple recent falls. Pelvic and coccygeal pain. Initial encounter.  EXAM: PELVIS - 1-2 VIEW  COMPARISON:  Lumbar spine radiographs performed 05/03/2013  FINDINGS: There is no evidence of fracture or dislocation. Both femoral heads are seated normally within their respective acetabula. No significant degenerative change is appreciated. The sacroiliac joints are unremarkable in appearance.  The visualized bowel gas pattern is grossly unremarkable in appearance. Scattered phleboliths are noted within the pelvis. Scattered clips are seen about the pelvis.  IMPRESSION: No evidence of fracture or dislocation.   Electronically Signed   By: Roanna Raider M.D.   On: 03/11/2014 02:31   Ct Head Wo Contrast  03/11/2014   CLINICAL DATA:  Worsening generalized weakness and recurrent falls. Weakness for 5 weeks. Slowed speech. Concern for maxillofacial or cervical spine injury. Initial encounter.  EXAM: CT HEAD WITHOUT CONTRAST  CT MAXILLOFACIAL WITHOUT CONTRAST  CT CERVICAL SPINE WITHOUT CONTRAST  TECHNIQUE: Multidetector CT imaging of the head, cervical spine, and maxillofacial structures were performed using the standard protocol without intravenous contrast. Multiplanar CT image reconstructions of the cervical spine and maxillofacial structures were also generated.  COMPARISON:  CT of the head performed 10/31/2013, CT of the cervical spine performed 05/03/2013, and MRI of the brain performed 09/20/2011   FINDINGS: CT HEAD FINDINGS  There is no evidence of acute infarction, mass lesion, or intra- or extra-axial hemorrhage on CT.  The posterior fossa, including the cerebellum, brainstem and fourth ventricle, is within normal limits. The third and lateral ventricles, and basal ganglia are unremarkable in appearance. The cerebral hemispheres are symmetric in appearance, with normal gray-white differentiation. No mass effect or midline shift is seen.  There is no evidence of fracture; visualized osseous structures are unremarkable in appearance. The orbits are within normal limits. The paranasal sinuses and mastoid air cells are well-aerated. No significant soft tissue abnormalities are seen.  CT MAXILLOFACIAL FINDINGS  There is no evidence of fracture or dislocation. The maxilla and mandible appear intact. Postoperative change is noted at the central mandible. The nasal bone is unremarkable in appearance. There is chronic complete absence of the dentition.  The orbits are intact bilaterally. The visualized paranasal sinuses and mastoid air cells are well-aerated.  No significant soft tissue abnormalities are seen. The parapharyngeal fat planes are preserved. The nasopharynx, oropharynx and hypopharynx are unremarkable in appearance. The visualized portions of the valleculae and piriform sinuses are grossly unremarkable.  The parotid and submandibular glands are within normal limits. No cervical lymphadenopathy is seen.  CT CERVICAL SPINE FINDINGS  There is no evidence of fracture or subluxation. The patient is status post anterior cervical spinal fusion at C4-C7. Vertebral bodies demonstrate normal height and alignment. Prevertebral soft tissues are within normal limits.  The thyroid gland is unremarkable in appearance. Minimal interstitial prominence at the lung apices is likely transient in nature. No significant soft tissue abnormalities are seen.  IMPRESSION: 1. No evidence of traumatic intracranial injury or  fracture. 2. No evidence of fracture or dislocation with regard to the maxillofacial structures. 3. No evidence of fracture or subluxation along the cervical spine. 4. Status post anterior cervical spinal fusion at C4-C7.   Electronically Signed   By: Roanna Raider M.D.   On: 03/11/2014 02:59   Ct Cervical Spine Wo Contrast  03/11/2014  CLINICAL DATA:  Worsening generalized weakness and recurrent falls. Weakness for 5 weeks. Slowed speech. Concern for maxillofacial or cervical spine injury. Initial encounter.  EXAM: CT HEAD WITHOUT CONTRAST  CT MAXILLOFACIAL WITHOUT CONTRAST  CT CERVICAL SPINE WITHOUT CONTRAST  TECHNIQUE: Multidetector CT imaging of the head, cervical spine, and maxillofacial structures were performed using the standard protocol without intravenous contrast. Multiplanar CT image reconstructions of the cervical spine and maxillofacial structures were also generated.  COMPARISON:  CT of the head performed 10/31/2013, CT of the cervical spine performed 05/03/2013, and MRI of the brain performed 09/20/2011  FINDINGS: CT HEAD FINDINGS  There is no evidence of acute infarction, mass lesion, or intra- or extra-axial hemorrhage on CT.  The posterior fossa, including the cerebellum, brainstem and fourth ventricle, is within normal limits. The third and lateral ventricles, and basal ganglia are unremarkable in appearance. The cerebral hemispheres are symmetric in appearance, with normal gray-white differentiation. No mass effect or midline shift is seen.  There is no evidence of fracture; visualized osseous structures are unremarkable in appearance. The orbits are within normal limits. The paranasal sinuses and mastoid air cells are well-aerated. No significant soft tissue abnormalities are seen.  CT MAXILLOFACIAL FINDINGS  There is no evidence of fracture or dislocation. The maxilla and mandible appear intact. Postoperative change is noted at the central mandible. The nasal bone is unremarkable in  appearance. There is chronic complete absence of the dentition.  The orbits are intact bilaterally. The visualized paranasal sinuses and mastoid air cells are well-aerated.  No significant soft tissue abnormalities are seen. The parapharyngeal fat planes are preserved. The nasopharynx, oropharynx and hypopharynx are unremarkable in appearance. The visualized portions of the valleculae and piriform sinuses are grossly unremarkable.  The parotid and submandibular glands are within normal limits. No cervical lymphadenopathy is seen.  CT CERVICAL SPINE FINDINGS  There is no evidence of fracture or subluxation. The patient is status post anterior cervical spinal fusion at C4-C7. Vertebral bodies demonstrate normal height and alignment. Prevertebral soft tissues are within normal limits.  The thyroid gland is unremarkable in appearance. Minimal interstitial prominence at the lung apices is likely transient in nature. No significant soft tissue abnormalities are seen.  IMPRESSION: 1. No evidence of traumatic intracranial injury or fracture. 2. No evidence of fracture or dislocation with regard to the maxillofacial structures. 3. No evidence of fracture or subluxation along the cervical spine. 4. Status post anterior cervical spinal fusion at C4-C7.   Electronically Signed   By: Roanna RaiderJeffery  Chang M.D.   On: 03/11/2014 02:59   Ct Maxillofacial Wo Cm  03/11/2014   CLINICAL DATA:  Worsening generalized weakness and recurrent falls. Weakness for 5 weeks. Slowed speech. Concern for maxillofacial or cervical spine injury. Initial encounter.  EXAM: CT HEAD WITHOUT CONTRAST  CT MAXILLOFACIAL WITHOUT CONTRAST  CT CERVICAL SPINE WITHOUT CONTRAST  TECHNIQUE: Multidetector CT imaging of the head, cervical spine, and maxillofacial structures were performed using the standard protocol without intravenous contrast. Multiplanar CT image reconstructions of the cervical spine and maxillofacial structures were also generated.  COMPARISON:  CT  of the head performed 10/31/2013, CT of the cervical spine performed 05/03/2013, and MRI of the brain performed 09/20/2011  FINDINGS: CT HEAD FINDINGS  There is no evidence of acute infarction, mass lesion, or intra- or extra-axial hemorrhage on CT.  The posterior fossa, including the cerebellum, brainstem and fourth ventricle, is within normal limits. The third and lateral ventricles, and basal ganglia are unremarkable in appearance. The  cerebral hemispheres are symmetric in appearance, with normal gray-white differentiation. No mass effect or midline shift is seen.  There is no evidence of fracture; visualized osseous structures are unremarkable in appearance. The orbits are within normal limits. The paranasal sinuses and mastoid air cells are well-aerated. No significant soft tissue abnormalities are seen.  CT MAXILLOFACIAL FINDINGS  There is no evidence of fracture or dislocation. The maxilla and mandible appear intact. Postoperative change is noted at the central mandible. The nasal bone is unremarkable in appearance. There is chronic complete absence of the dentition.  The orbits are intact bilaterally. The visualized paranasal sinuses and mastoid air cells are well-aerated.  No significant soft tissue abnormalities are seen. The parapharyngeal fat planes are preserved. The nasopharynx, oropharynx and hypopharynx are unremarkable in appearance. The visualized portions of the valleculae and piriform sinuses are grossly unremarkable.  The parotid and submandibular glands are within normal limits. No cervical lymphadenopathy is seen.  CT CERVICAL SPINE FINDINGS  There is no evidence of fracture or subluxation. The patient is status post anterior cervical spinal fusion at C4-C7. Vertebral bodies demonstrate normal height and alignment. Prevertebral soft tissues are within normal limits.  The thyroid gland is unremarkable in appearance. Minimal interstitial prominence at the lung apices is likely transient in  nature. No significant soft tissue abnormalities are seen.  IMPRESSION: 1. No evidence of traumatic intracranial injury or fracture. 2. No evidence of fracture or dislocation with regard to the maxillofacial structures. 3. No evidence of fracture or subluxation along the cervical spine. 4. Status post anterior cervical spinal fusion at C4-C7.   Electronically Signed   By: Roanna Raider M.D.   On: 03/11/2014 02:59     Microbiology: Recent Results (from the past 240 hour(s))  MRSA PCR Screening     Status: None   Collection Time: 03/11/14  4:29 AM  Result Value Ref Range Status   MRSA by PCR NEGATIVE NEGATIVE Final    Comment:        The GeneXpert MRSA Assay (FDA approved for NASAL specimens only), is one component of a comprehensive MRSA colonization surveillance program. It is not intended to diagnose MRSA infection nor to guide or monitor treatment for MRSA infections.      Labs: Basic Metabolic Panel:  Recent Labs Lab 03/10/14 2224  03/12/14 1122 03/12/14 1629 03/13/14 0548 03/14/14 0535 03/15/14 1210  NA 114*  < > 122* 121* 123* 130* 134*  K 3.6  < > 3.8 3.5 3.8 4.0 4.2  CL 81*  < > 88* 92* 92* 99 105  CO2 25  < > 27 21 27 25 24   GLUCOSE 98  < > 89 100* 85 84 80  BUN 12  < > 7 9 11 9 8   CREATININE 0.65  < > 0.60 0.70 0.71 0.70 0.73  CALCIUM 8.1*  < > 7.9* 7.9* 7.8* 8.3* 8.2*  MG 1.6  --   --   --   --   --   --   < > = values in this interval not displayed. Liver Function Tests:  Recent Labs Lab 03/10/14 2224  AST 26  ALT 25  ALKPHOS 51  BILITOT 0.7  PROT 6.5  ALBUMIN 3.7   No results for input(s): LIPASE, AMYLASE in the last 168 hours. No results for input(s): AMMONIA in the last 168 hours. CBC:  Recent Labs Lab 03/10/14 2224 03/11/14 0209 03/11/14 0605 03/12/14 0517 03/14/14 1052  WBC 4.5  --  4.2 4.0 3.8*  NEUTROABS  --   --  2.8  --   --   HGB 10.8* 14.3 10.2* 10.1* 10.7*  HCT 31.2* 42.0 29.6* 29.8* 32.7*  MCV 89.9  --  89.7 91.1 95.6    PLT 236  --  213 187 208   Cardiac Enzymes: No results for input(s): CKTOTAL, CKMB, CKMBINDEX, TROPONINI in the last 168 hours. BNP: Invalid input(s): POCBNP CBG: No results for input(s): GLUCAP in the last 168 hours.  Time coordinating discharge:  Greater than 30 minutes  Signed:  Madalyn Legner, DO [facility] Pager: [phone] 03/15/2014, 2:07 PM

## 2014-03-15 NOTE — Progress Notes (Signed)
    Progress Note   Covering for Dr. Elnoria HowardHung  Subjective  Awoke her from sleep. No significant hematochezia, just scant amount of residual blood. Agitated with son this am   Objective   Vital signs in last 24 hours: Temp:  [97.3 F (36.3 C)-98.5 F (36.9 C)] 97.3 F (36.3 C) (03/05 0617) Pulse Rate:  [63-82] 63 (03/05 0617) Resp:  [16-18] 16 (03/05 0617) BP: (98-118)/(63-78) 118/73 mmHg (03/05 0617) SpO2:  [94 %-100 %] 100 % (03/05 0617) Weight:  [208 lb 6.4 oz (94.53 kg)] 208 lb 6.4 oz (94.53 kg) (03/05 0617) Last BM Date: 03/14/14 General:    white female in NAD Heart:  Regular rate and rhythm Abdomen:  Soft, nontender and nondistended. Normal bowel sounds. Extremities:  Without edema. Neurologic:  Alert and oriented,  grossly normal neurologically. Psych:  Cooperative. Normal mood and affect.    Lab Results:  Recent Labs  03/14/14 1052  WBC 3.8*  HGB 10.7*  HCT 32.7*  PLT 208   BMET  Recent Labs  03/12/14 1629 03/13/14 0548 03/14/14 0535  NA 121* 123* 130*  K 3.5 3.8 4.0  CL 92* 92* 99  CO2 21 27 25   GLUCOSE 100* 85 84  BUN 9 11 9   CREATININE 0.70 0.71 0.70  CALCIUM 7.9* 7.8* 8.3*    PT/INR  Recent Labs  03/14/14 1056  LABPROT 13.5  INR 1.02      Assessment / Plan:    801. 54 year old female with chronic, intermittent hematochezia. 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  No significant bleeding overnight, hgb stable. She should be fine for discharge from a GI standpoint.   2. Normocytic anemia. Hgb stable at 10.7. She gets periodic iron infusions with hematology.     LOS: 4 days   Willette Clusteraula Guenther  03/15/2014, 9:20 AM   GI Attending Note  I have personally taken an interval history, reviewed the chart, and examined the patient.  I agree with the extender's note, impression and recommendations.  Barbette Hairobert D. Arlyce DiceKaplan, MD, [facility] Hospital[facility]ACG [facility] [phone]]

## 2014-03-21 ENCOUNTER — Telehealth: Payer: Self-pay | Admitting: Hematology & Oncology

## 2014-03-21 NOTE — Telephone Encounter (Signed)
Pt made 3-29 appointment

## 2014-03-26 ENCOUNTER — Encounter: Payer: Self-pay | Admitting: Hematology & Oncology

## 2014-04-08 ENCOUNTER — Ambulatory Visit: Payer: Medicare Other | Admitting: Hematology & Oncology

## 2014-04-08 ENCOUNTER — Other Ambulatory Visit: Payer: Medicare Other

## 2014-04-09 ENCOUNTER — Ambulatory Visit (HOSPITAL_BASED_OUTPATIENT_CLINIC_OR_DEPARTMENT_OTHER): Payer: Medicare Other | Admitting: Family

## 2014-04-09 ENCOUNTER — Ambulatory Visit: Payer: Medicare Other | Admitting: Cardiology

## 2014-04-09 ENCOUNTER — Encounter: Payer: Self-pay | Admitting: Hematology & Oncology

## 2014-04-09 ENCOUNTER — Ambulatory Visit (HOSPITAL_BASED_OUTPATIENT_CLINIC_OR_DEPARTMENT_OTHER): Payer: Medicare Other

## 2014-04-09 ENCOUNTER — Other Ambulatory Visit (HOSPITAL_BASED_OUTPATIENT_CLINIC_OR_DEPARTMENT_OTHER): Payer: Medicare Other

## 2014-04-09 VITALS — BP 123/82 | HR 78 | Temp 97.4°F | Resp 16 | Ht 68.0 in | Wt 208.0 lb

## 2014-04-09 DIAGNOSIS — D510 Vitamin B12 deficiency anemia due to intrinsic factor deficiency: Secondary | ICD-10-CM

## 2014-04-09 DIAGNOSIS — E538 Deficiency of other specified B group vitamins: Secondary | ICD-10-CM | POA: Diagnosis not present

## 2014-04-09 DIAGNOSIS — D500 Iron deficiency anemia secondary to blood loss (chronic): Secondary | ICD-10-CM | POA: Diagnosis not present

## 2014-04-09 LAB — VITAMIN B12: Vitamin B-12: 336 pg/mL (ref 211–911)

## 2014-04-09 LAB — CMP (CANCER CENTER ONLY)
ALK PHOS: 55 U/L (ref 26–84)
ALT(SGPT): 22 U/L (ref 10–47)
AST: 24 U/L (ref 11–38)
Albumin: 3.6 g/dL (ref 3.3–5.5)
BUN, Bld: 16 mg/dL (ref 7–22)
CO2: 21 meq/L (ref 18–33)
CREATININE: 0.8 mg/dL (ref 0.6–1.2)
Calcium: 8.7 mg/dL (ref 8.0–10.3)
Chloride: 93 mEq/L — ABNORMAL LOW (ref 98–108)
Glucose, Bld: 86 mg/dL (ref 73–118)
Potassium: 3.8 mEq/L (ref 3.3–4.7)
SODIUM: 132 meq/L (ref 128–145)
TOTAL PROTEIN: 7.2 g/dL (ref 6.4–8.1)
Total Bilirubin: 0.4 mg/dl (ref 0.20–1.60)

## 2014-04-09 LAB — CBC WITH DIFFERENTIAL (CANCER CENTER ONLY)
BASO#: 0 10*3/uL (ref 0.0–0.2)
BASO%: 0.2 % (ref 0.0–2.0)
EOS%: 1 % (ref 0.0–7.0)
Eosinophils Absolute: 0.1 10*3/uL (ref 0.0–0.5)
HCT: 33.9 % — ABNORMAL LOW (ref 34.8–46.6)
HGB: 11.4 g/dL — ABNORMAL LOW (ref 11.6–15.9)
LYMPH#: 0.9 10*3/uL (ref 0.9–3.3)
LYMPH%: 18.5 % (ref 14.0–48.0)
MCH: 31.6 pg (ref 26.0–34.0)
MCHC: 33.6 g/dL (ref 32.0–36.0)
MCV: 94 fL (ref 81–101)
MONO#: 0.3 10*3/uL (ref 0.1–0.9)
MONO%: 6.8 % (ref 0.0–13.0)
NEUT#: 3.5 10*3/uL (ref 1.5–6.5)
NEUT%: 73.5 % (ref 39.6–80.0)
Platelets: 207 10*3/uL (ref 145–400)
RBC: 3.61 10*6/uL — ABNORMAL LOW (ref 3.70–5.32)
RDW: 12.2 % (ref 11.1–15.7)
WBC: 4.8 10*3/uL (ref 3.9–10.0)

## 2014-04-09 MED ORDER — CYANOCOBALAMIN 1000 MCG/ML IJ SOLN
INTRAMUSCULAR | Status: AC
  Filled 2014-04-09: qty 1

## 2014-04-09 MED ORDER — CYANOCOBALAMIN 1000 MCG/ML IJ SOLN
1000.0000 ug | Freq: Once | INTRAMUSCULAR | Status: AC
  Administered 2014-04-09: 1000 ug via INTRAMUSCULAR

## 2014-04-09 NOTE — Patient Instructions (Signed)
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
## 2014-04-10 ENCOUNTER — Ambulatory Visit: Payer: Medicare Other

## 2014-04-10 LAB — IRON AND TIBC CHCC
%SAT: 23 % (ref 21–57)
Iron: 73 ug/dL (ref 41–142)
TIBC: 310 ug/dL (ref 236–444)
UIBC: 237 ug/dL (ref 120–384)

## 2014-04-10 LAB — FERRITIN CHCC: Ferritin: 440 ng/ml — ABNORMAL HIGH (ref 9–269)

## 2014-04-11 ENCOUNTER — Encounter (HOSPITAL_COMMUNITY): Payer: Self-pay | Admitting: Emergency Medicine

## 2014-04-11 ENCOUNTER — Emergency Department (HOSPITAL_COMMUNITY): Payer: Medicare Other

## 2014-04-11 ENCOUNTER — Other Ambulatory Visit: Payer: Self-pay

## 2014-04-11 ENCOUNTER — Other Ambulatory Visit (HOSPITAL_COMMUNITY): Payer: Self-pay

## 2014-04-11 ENCOUNTER — Inpatient Hospital Stay (HOSPITAL_COMMUNITY)
Admission: EM | Admit: 2014-04-11 | Discharge: 2014-04-14 | DRG: 640 | Disposition: A | Discharge status: Home / Self Care | Payer: Medicare Other | Attending: Internal Medicine | Admitting: Internal Medicine

## 2014-04-11 DIAGNOSIS — E8770 Fluid overload, unspecified: Secondary | ICD-10-CM | POA: Diagnosis present

## 2014-04-11 DIAGNOSIS — R55 Syncope and collapse: Secondary | ICD-10-CM | POA: Diagnosis present

## 2014-04-11 DIAGNOSIS — E875 Hyperkalemia: Secondary | ICD-10-CM | POA: Diagnosis present

## 2014-04-11 DIAGNOSIS — M1990 Unspecified osteoarthritis, unspecified site: Secondary | ICD-10-CM | POA: Diagnosis present

## 2014-04-11 DIAGNOSIS — E860 Dehydration: Secondary | ICD-10-CM | POA: Diagnosis present

## 2014-04-11 DIAGNOSIS — E038 Other specified hypothyroidism: Secondary | ICD-10-CM

## 2014-04-11 DIAGNOSIS — E876 Hypokalemia: Secondary | ICD-10-CM | POA: Diagnosis present

## 2014-04-11 DIAGNOSIS — F418 Other specified anxiety disorders: Secondary | ICD-10-CM | POA: Diagnosis present

## 2014-04-11 DIAGNOSIS — Z888 Allergy status to other drugs, medicaments and biological substances status: Secondary | ICD-10-CM | POA: Diagnosis not present

## 2014-04-11 DIAGNOSIS — D510 Vitamin B12 deficiency anemia due to intrinsic factor deficiency: Secondary | ICD-10-CM | POA: Diagnosis present

## 2014-04-11 DIAGNOSIS — R631 Polydipsia: Secondary | ICD-10-CM | POA: Diagnosis present

## 2014-04-11 DIAGNOSIS — D500 Iron deficiency anemia secondary to blood loss (chronic): Secondary | ICD-10-CM | POA: Diagnosis present

## 2014-04-11 DIAGNOSIS — F319 Bipolar disorder, unspecified: Secondary | ICD-10-CM | POA: Diagnosis present

## 2014-04-11 DIAGNOSIS — F1190 Opioid use, unspecified, uncomplicated: Secondary | ICD-10-CM | POA: Diagnosis not present

## 2014-04-11 DIAGNOSIS — Z79891 Long term (current) use of opiate analgesic: Secondary | ICD-10-CM | POA: Diagnosis not present

## 2014-04-11 DIAGNOSIS — E119 Type 2 diabetes mellitus without complications: Secondary | ICD-10-CM | POA: Diagnosis present

## 2014-04-11 DIAGNOSIS — M797 Fibromyalgia: Secondary | ICD-10-CM | POA: Diagnosis present

## 2014-04-11 DIAGNOSIS — Z9884 Bariatric surgery status: Secondary | ICD-10-CM

## 2014-04-11 DIAGNOSIS — E039 Hypothyroidism, unspecified: Secondary | ICD-10-CM | POA: Diagnosis present

## 2014-04-11 DIAGNOSIS — Z9049 Acquired absence of other specified parts of digestive tract: Secondary | ICD-10-CM | POA: Diagnosis present

## 2014-04-11 DIAGNOSIS — E871 Hypo-osmolality and hyponatremia: Principal | ICD-10-CM | POA: Diagnosis present

## 2014-04-11 DIAGNOSIS — Z880 Allergy status to penicillin: Secondary | ICD-10-CM

## 2014-04-11 DIAGNOSIS — G8929 Other chronic pain: Secondary | ICD-10-CM | POA: Diagnosis present

## 2014-04-11 DIAGNOSIS — Z882 Allergy status to sulfonamides status: Secondary | ICD-10-CM

## 2014-04-11 DIAGNOSIS — M810 Age-related osteoporosis without current pathological fracture: Secondary | ICD-10-CM | POA: Diagnosis present

## 2014-04-11 DIAGNOSIS — G4733 Obstructive sleep apnea (adult) (pediatric): Secondary | ICD-10-CM | POA: Diagnosis present

## 2014-04-11 DIAGNOSIS — G9340 Encephalopathy, unspecified: Secondary | ICD-10-CM | POA: Diagnosis present

## 2014-04-11 DIAGNOSIS — E538 Deficiency of other specified B group vitamins: Secondary | ICD-10-CM | POA: Diagnosis present

## 2014-04-11 LAB — BASIC METABOLIC PANEL
ANION GAP: 6 (ref 5–15)
ANION GAP: 8 (ref 5–15)
BUN: 17 mg/dL (ref 6–23)
BUN: 13 mg/dL (ref 6–23)
CALCIUM: 8.2 mg/dL — AB (ref 8.4–10.5)
CHLORIDE: 95 mmol/L — AB (ref 96–112)
CO2: 19 mmol/L (ref 19–32)
CO2: 20 mmol/L (ref 19–32)
CREATININE: 0.78 mg/dL (ref 0.50–1.10)
Calcium: 7.4 mg/dL — ABNORMAL LOW (ref 8.4–10.5)
Chloride: 99 mmol/L (ref 96–112)
Creatinine, Ser: 0.71 mg/dL (ref 0.50–1.10)
GFR calc Af Amer: >90 mL/min (ref >90)
GFR calc non Af Amer: >90 mL/min (ref >90)
Glucose, Bld: 107 mg/dL — ABNORMAL HIGH (ref 70–99)
Glucose, Bld: 67 mg/dL — ABNORMAL LOW (ref 70–99)
Potassium: 3.1 mmol/L — ABNORMAL LOW (ref 3.5–5.1)
Potassium: 4.4 mmol/L (ref 3.5–5.1)
SODIUM: 127 mmol/L — AB (ref 135–145)
Sodium: 120 mmol/L — ABNORMAL LOW (ref 135–145)

## 2014-04-11 LAB — I-STAT ARTERIAL BLOOD GAS, ED
Acid-base deficit: 10 mmol/L — ABNORMAL HIGH (ref 0.0–2.0)
Bicarbonate: 15.3 mEq/L — ABNORMAL LOW (ref 20.0–24.0)
O2 Saturation: 93 %
PCO2 ART: 31.6 mmHg — AB (ref 35.0–45.0)
PH ART: 7.291 — AB (ref 7.350–7.450)
Patient temperature: 98
TCO2: 16 mmol/L (ref 0–100)
pO2, Arterial: 73 mmHg — ABNORMAL LOW (ref 80.0–100.0)

## 2014-04-11 LAB — I-STAT TROPONIN, ED: Troponin i, poc: 0 ng/mL (ref 0.00–0.08)

## 2014-04-11 LAB — CBC
HCT: 27.8 % — ABNORMAL LOW (ref 36.0–46.0)
Hemoglobin: 9.6 g/dL — ABNORMAL LOW (ref 12.0–15.0)
MCH: 31.4 pg (ref 26.0–34.0)
MCHC: 34.5 g/dL (ref 30.0–36.0)
MCV: 90.8 fL (ref 78.0–100.0)
Platelets: 146 10*3/uL — ABNORMAL LOW (ref 150–400)
RBC: 3.06 MIL/uL — ABNORMAL LOW (ref 3.87–5.11)
RDW: 12.7 % (ref 11.5–15.5)
WBC: 3.8 10*3/uL — AB (ref 4.0–10.5)

## 2014-04-11 LAB — URINALYSIS, ROUTINE W REFLEX MICROSCOPIC
Bilirubin Urine: NEGATIVE
Glucose, UA: NEGATIVE mg/dL
HGB URINE DIPSTICK: NEGATIVE
Ketones, ur: NEGATIVE mg/dL
Leukocytes, UA: NEGATIVE
Nitrite: NEGATIVE
Protein, ur: NEGATIVE mg/dL
SPECIFIC GRAVITY, URINE: 1.009 (ref 1.005–1.030)
UROBILINOGEN UA: 1 mg/dL (ref 0.0–1.0)
pH: 6 (ref 5.0–8.0)

## 2014-04-11 LAB — RAPID URINE DRUG SCREEN, HOSP PERFORMED
AMPHETAMINES: NOT DETECTED
BENZODIAZEPINES: POSITIVE — AB
Barbiturates: NOT DETECTED
COCAINE: NOT DETECTED
OPIATES: NOT DETECTED
TETRAHYDROCANNABINOL: NOT DETECTED

## 2014-04-11 LAB — POC OCCULT BLOOD, ED: Fecal Occult Bld: NEGATIVE

## 2014-04-11 LAB — CBG MONITORING, ED: Glucose-Capillary: 91 mg/dL (ref 70–99)

## 2014-04-11 LAB — POC URINE PREG, ED: Preg Test, Ur: NEGATIVE

## 2014-04-11 MED ORDER — MAGNESIUM 250 MG PO TABS: 1.0000 | ORAL_TABLET | Freq: Every day | ORAL | Status: DC

## 2014-04-11 MED ORDER — SODIUM CHLORIDE 0.9 % IV BOLUS (SEPSIS)
1000.0000 mL | Freq: Once | INTRAVENOUS | Status: AC
  Administered 2014-04-11: 1000 mL via INTRAVENOUS

## 2014-04-11 MED ORDER — POTASSIUM CHLORIDE CRYS ER 20 MEQ PO TBCR
40.0000 meq | EXTENDED_RELEASE_TABLET | Freq: Once | ORAL | Status: AC
  Administered 2014-04-11: 40 meq via ORAL
  Filled 2014-04-11: qty 2

## 2014-04-11 MED ORDER — LEVOTHYROXINE SODIUM 112 MCG PO TABS
224.0000 ug | ORAL_TABLET | ORAL | Status: DC
  Administered 2014-04-12 – 2014-04-13 (×2): 224 ug via ORAL
  Filled 2014-04-11 (×4): qty 2

## 2014-04-11 MED ORDER — HEPARIN SODIUM (PORCINE) 5000 UNIT/ML IJ SOLN
5000.0000 [IU] | Freq: Three times a day (TID) | INTRAMUSCULAR | Status: DC
  Administered 2014-04-11 – 2014-04-14 (×9): 5000 [IU] via SUBCUTANEOUS
  Filled 2014-04-11 (×12): qty 1

## 2014-04-11 MED ORDER — TOPIRAMATE 100 MG PO TABS
100.0000 mg | ORAL_TABLET | Freq: Two times a day (BID) | ORAL | Status: DC
  Administered 2014-04-11 – 2014-04-14 (×6): 100 mg via ORAL
  Filled 2014-04-11 (×8): qty 1

## 2014-04-11 MED ORDER — OXYCODONE HCL 5 MG PO TABS
10.0000 mg | ORAL_TABLET | Freq: Four times a day (QID) | ORAL | Status: DC | PRN
  Administered 2014-04-11 – 2014-04-14 (×6): 10 mg via ORAL
  Filled 2014-04-11 (×6): qty 2

## 2014-04-11 MED ORDER — SODIUM CHLORIDE 0.9 % IV SOLN
INTRAVENOUS | Status: DC
  Administered 2014-04-11 – 2014-04-12 (×3): via INTRAVENOUS

## 2014-04-11 MED ORDER — MODAFINIL 100 MG PO TABS
400.0000 mg | ORAL_TABLET | Freq: Every morning | ORAL | Status: DC
  Administered 2014-04-14: 400 mg via ORAL
  Filled 2014-04-11 (×4): qty 4

## 2014-04-11 MED ORDER — SUMATRIPTAN SUCCINATE 100 MG PO TABS
100.0000 mg | ORAL_TABLET | ORAL | Status: DC | PRN
  Administered 2014-04-11 – 2014-04-13 (×5): 100 mg via ORAL
  Filled 2014-04-11 (×7): qty 1

## 2014-04-11 MED ORDER — FLUTICASONE PROPIONATE 50 MCG/ACT NA SUSP
2.0000 | Freq: Every day | NASAL | Status: DC
  Administered 2014-04-11 – 2014-04-14 (×4): 2 via NASAL
  Filled 2014-04-11: qty 16

## 2014-04-11 MED ORDER — LEVOTHYROXINE SODIUM 112 MCG PO TABS: 112.0000 ug | ORAL_TABLET | Freq: Every day | ORAL | Status: DC

## 2014-04-11 MED ORDER — MAGNESIUM OXIDE 400 (241.3 MG) MG PO TABS
200.0000 mg | ORAL_TABLET | Freq: Every day | ORAL | Status: DC
  Administered 2014-04-11 – 2014-04-14 (×4): 200 mg via ORAL
  Filled 2014-04-11 (×5): qty 0.5

## 2014-04-11 MED ORDER — OXYCODONE-ACETAMINOPHEN 10-325 MG PO TABS: 1.0000 | ORAL_TABLET | Freq: Four times a day (QID) | ORAL | Status: DC | PRN

## 2014-04-11 MED ORDER — LORATADINE 10 MG PO TABS
10.0000 mg | ORAL_TABLET | Freq: Every day | ORAL | Status: DC | PRN
Start: 2014-04-11 — End: 2014-04-14
  Filled 2014-04-11: qty 1

## 2014-04-11 MED ORDER — MORPHINE SULFATE ER 30 MG PO TBCR
30.0000 mg | EXTENDED_RELEASE_TABLET | Freq: Two times a day (BID) | ORAL | Status: DC
  Administered 2014-04-11 – 2014-04-14 (×7): 30 mg via ORAL
  Filled 2014-04-11 (×7): qty 1

## 2014-04-11 MED ORDER — ONDANSETRON HCL 4 MG/2ML IJ SOLN: 4.0000 mg | Freq: Four times a day (QID) | INTRAMUSCULAR | Status: DC | PRN

## 2014-04-11 MED ORDER — ALPRAZOLAM 0.5 MG PO TABS
1.0000 mg | ORAL_TABLET | Freq: Four times a day (QID) | ORAL | Status: DC | PRN
  Administered 2014-04-11 – 2014-04-13 (×4): 1 mg via ORAL
  Filled 2014-04-11 (×4): qty 2

## 2014-04-11 MED ORDER — SODIUM CHLORIDE 0.9 % IJ SOLN
3.0000 mL | Freq: Two times a day (BID) | INTRAMUSCULAR | Status: DC
  Administered 2014-04-13 – 2014-04-14 (×2): 3 mL via INTRAVENOUS

## 2014-04-11 MED ORDER — DICLOFENAC SODIUM 75 MG PO TBEC
75.0000 mg | DELAYED_RELEASE_TABLET | Freq: Two times a day (BID) | ORAL | Status: DC
  Administered 2014-04-11 – 2014-04-14 (×7): 75 mg via ORAL
  Filled 2014-04-11 (×9): qty 1

## 2014-04-11 MED ORDER — GATORADE (BH)
240.0000 mL | Freq: Four times a day (QID) | ORAL | Status: DC
  Administered 2014-04-11: 240 mL via ORAL
  Filled 2014-04-11 (×2): qty 480

## 2014-04-11 MED ORDER — ALUM & MAG HYDROXIDE-SIMETH 200-200-20 MG/5ML PO SUSP: 30.0000 mL | Freq: Four times a day (QID) | ORAL | Status: DC | PRN

## 2014-04-11 MED ORDER — LEVOTHYROXINE SODIUM 112 MCG PO TABS
112.0000 ug | ORAL_TABLET | ORAL | Status: DC
  Administered 2014-04-14: 112 ug via ORAL
  Filled 2014-04-11: qty 1

## 2014-04-11 MED ORDER — PANTOPRAZOLE SODIUM 40 MG PO TBEC
40.0000 mg | DELAYED_RELEASE_TABLET | Freq: Every day | ORAL | Status: DC
  Administered 2014-04-11 – 2014-04-14 (×4): 40 mg via ORAL
  Filled 2014-04-11 (×4): qty 1

## 2014-04-11 MED ORDER — ONDANSETRON HCL 4 MG PO TABS: 4.0000 mg | ORAL_TABLET | Freq: Four times a day (QID) | ORAL | Status: DC | PRN

## 2014-04-11 MED ORDER — ACETAMINOPHEN 325 MG PO TABS
325.0000 mg | ORAL_TABLET | Freq: Four times a day (QID) | ORAL | Status: DC | PRN
  Administered 2014-04-11 – 2014-04-12 (×3): 325 mg via ORAL
  Filled 2014-04-11 (×3): qty 1

## 2014-04-11 MED ORDER — ACARBOSE 25 MG PO TABS
25.0000 mg | ORAL_TABLET | Freq: Every day | ORAL | Status: DC | PRN
  Filled 2014-04-11: qty 1

## 2014-04-11 NOTE — ED Notes (Signed)
Patients family member states that patient did lose pulse when they checked it during the event. MD notified.

## 2014-04-11 NOTE — Progress Notes (Signed)
1054  [facility] Arrived to the floor via stretcher .

## 2014-04-11 NOTE — ED Notes (Signed)
Admitting doctor at bedside 

## 2014-04-11 NOTE — ED Notes (Signed)
BP 89/53. Patient placed in trendelenburg and liter of fluid started.

## 2014-04-11 NOTE — Progress Notes (Signed)
ABG results given to Dr Gwendolyn GrantWalden

## 2014-04-11 NOTE — ED Notes (Signed)
hospitalist at bedside

## 2014-04-11 NOTE — Progress Notes (Signed)
PT does not wish to wear CPAP. RN aware.
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
## 2014-04-11 NOTE — ED Provider Notes (Signed)
CSN: [id number]     Arrival date & time 04/11/14  2952 History   First MD Initiated Contact with Patient 04/11/14 [phone]     Chief Complaint  Patient presents with  . Loss of Consciousness     (Consider location/radiation/quality/duration/timing/severity/associated sxs/prior Treatment) HPI Comments: Had syncopal event at home. Friend there who is an EMT-B checked for a pulse and didn't find one. CPR was initiated, 7 cycles of 15:2 compressions:breaths performed.  Patient responded after about 2.5 minutes of CPR, another 2.5 minutes were performed until she was more with it.  Patient is a 54 y.o. female presenting with syncope. The history is provided by the patient.  Loss of Consciousness Episode history:  Single Most recent episode:  Today Duration:  5 minutes Timing:  Constant Progression:  Resolved Chronicity:  New Context: standing up   Witnessed: yes   Relieved by: CPR by family. Ineffective treatments:  None tried Associated symptoms: no chest pain, no confusion, no fever, no nausea, no shortness of breath and no vomiting     Past Medical History  Diagnosis Date  . Osteoporosis   . Hypothyroidism   . Diabetes mellitus   . Exogenous obesity   . Rhinitis   . Arthritis   . Fibromyalgia   . Somnolence   . Sleep apnea   . Anemia, iron deficiency 04/06/2011  . Pernicious anemia 04/06/2011  . Depression   . Anxiety   . Iron deficiency anemia, unspecified 12/12/2012  . Iron deficiency anemia due to chronic blood loss 01/15/2014   Past Surgical History  Procedure Laterality Date  . Gastric bypass    . Cholecystectomy    . Tracheostomy    . Uvulectomy    . Tonsillectomy    . Mandible reconstruction    . Spinal fusion    . Dilation and curettage of uterus    . Cesarean section     Family History  Problem Relation Age of Onset  . CAD Father   . Diabetes Father   . CAD Brother      X3  . Hypertension Brother   . Colon cancer Brother    History  Substance Use  Topics  . Smoking status: Never Smoker   . Smokeless tobacco: Never Used     Comment: never used tobacco  . Alcohol Use: 0.0 oz/week    0 Standard drinks or equivalent per week     Comment: one mixed drink twice yearly   OB History    No data available     Review of Systems  Constitutional: Negative for fever.  Respiratory: Negative for cough and shortness of breath.   Cardiovascular: Positive for syncope. Negative for chest pain.  Gastrointestinal: Negative for nausea and vomiting.  Psychiatric/Behavioral: Negative for confusion.  All other systems reviewed and are negative.     Allergies  Iodine; Iron; Lovaza; Soma; Sulfonamide derivatives; Benazepril hcl; Codeine; Penicillins; Zolpidem tartrate; and Amitriptyline  Home Medications   Prior to Admission medications   Medication Sig Start Date End Date Taking? Authorizing Provider  acarbose (PRECOSE) 25 MG tablet Take 25 mg by mouth daily as needed (for low blood sugar).  10/09/12   Historical Provider, MD  alprazolam Prudy Feeler) 2 MG tablet Take two tablets by mouth twice daily as needed for anxiety; Take two tablets by mouth at bedtime as needed for sleep or anxiety Patient taking differently: Take 2-4 mg by mouth 3 (three) times daily as needed for sleep or anxiety. Take 1 tablet by mouth  twice daily as needed for anxiety; Take two tablets by mouth at bedtime as needed for sleep or anxiety 11/11/13   Josph Macho, MD  Cholecalciferol (VITAMIN D-3) 5000 UNITS TABS Take 2 tablets by mouth daily.    Historical Provider, MD  Cyanocobalamin (VITAMIN B-12 IJ) Inject as directed every 28 (twenty-eight) days.    Historical Provider, MD  cyclobenzaprine (FLEXERIL) 10 MG tablet Take 1 tablet (10 mg total) by mouth 3 (three) times daily. 11/03/13   Renae Fickle, MD  diclofenac (VOLTAREN) 75 MG EC tablet Take 75 mg by mouth 2 (two) times daily.    Historical Provider, MD  docusate sodium 100 MG CAPS Take 100 mg by mouth 2 (two) times  daily. Patient taking differently: Take 100 mg by mouth 3 (three) times a week.  11/03/13   Renae Fickle, MD  levothyroxine (SYNTHROID, LEVOTHROID) 112 MCG tablet Take 112-224 mcg by mouth daily before breakfast. Pt takes every day, but on Monday she takes 112 mcg.    Historical Provider, MD  loratadine (CLARITIN) 10 MG tablet Take 1 tablet (10 mg total) by mouth 2 (two) times daily. Patient taking differently: Take 10 mg by mouth daily as needed for allergies.  07/13/12   Thermon Leyland, NP  Magnesium 250 MG TABS Take 1 tablet by mouth daily.    Historical Provider, MD  modafinil (PROVIGIL) 200 MG tablet Take 2 tablets (400 mg total) by mouth every morning. 11/05/13   Mahima Glade Lloyd, MD  mometasone (NASONEX) 50 MCG/ACT nasal spray Place 2 sprays into the nose daily.    Historical Provider, MD  omeprazole (PRILOSEC) 40 MG capsule Take 1 capsule (40 mg total) by mouth daily. 07/13/12   Thermon Leyland, NP  oxymorphone (OPANA ER) 30 MG 12 hr tablet Take 30 mg by mouth every 12 (twelve) hours.    Historical Provider, MD  potassium chloride (KLOR-CON) 20 MEQ packet Take 20 mEq by mouth once.    Historical Provider, MD  promethazine (PHENERGAN) 25 MG tablet Take 25 mg by mouth every 8 (eight) hours as needed for nausea.     Historical Provider, MD  SUMAtriptan (IMITREX) 100 MG tablet Take 100 mg by mouth every 2 (two) hours as needed for migraine.  05/01/12   Historical Provider, MD  topiramate (TOPAMAX) 200 MG tablet Take 1 tablet (200 mg total) by mouth at bedtime. 07/13/12   Thermon Leyland, NP  valACYclovir (VALTREX) 1000 MG tablet Take 1,000 mg by mouth daily as needed (for flare ups).  08/16/11   Historical Provider, MD   BP 95/58 mmHg  Pulse 74  Temp(Src) 97.8 F (36.6 C) (Oral)  Resp 12  SpO2 91% Physical Exam  Constitutional: She is oriented to person, place, and time. She appears well-developed and well-nourished. No distress.  HENT:  Head: Normocephalic and atraumatic.  Mouth/Throat:  Oropharynx is clear and moist.  Eyes: EOM are normal. Pupils are equal, round, and reactive to light.  Neck: Normal range of motion. Neck supple.  Cardiovascular: Normal rate and regular rhythm.  Exam reveals no friction rub.   No murmur heard. Pulmonary/Chest: Effort normal and breath sounds normal. No respiratory distress. She has no wheezes. She has no rales.  Abdominal: Soft. She exhibits no distension. There is no tenderness. There is no rebound.  Musculoskeletal: Normal range of motion. She exhibits no edema.  Neurological: She is alert and oriented to person, place, and time. No cranial nerve deficit. She exhibits normal muscle tone. GCS eye  subscore is 4. GCS verbal subscore is 4. GCS motor subscore is 6.  Skin: She is not diaphoretic.  Nursing note and vitals reviewed.   ED Course  Procedures (including critical care time) Labs Review Labs Reviewed  CBC  BASIC METABOLIC PANEL  BLOOD GAS, ARTERIAL  I-STAT TROPOININ, ED  CBG MONITORING, ED    Imaging Review Dg Chest Portable 1 View  04/11/2014   CLINICAL DATA:  Loss of consciousness. Shortness of breath. Hypotension.  EXAM: PORTABLE CHEST - 1 VIEW  COMPARISON:  03/10/2014  FINDINGS: Heart size and pulmonary vascularity are normal and the lungs are clear. No acute osseous abnormality.  IMPRESSION: No acute abnormalities.   Electronically Signed   By: Francene BoyersJames  Maxwell M.D.   On: 04/11/2014 07:37     EKG Interpretation None      MDM   Final diagnoses:  Syncope, unspecified syncope type  Hyponatremia    39F here after a syncopal event. Loss pulses per son at home. Had about 5 minutes of CPR. Patient here listless, but following commands, eyes opening spontaneously, answering yes/no questions. No signs of trauma. Softer BPs. Will check labs, continue monitoring. Since she's Neurologically intact, no need for cooling measures.  Patient more arousable after some observation. ABG shows mild acidosis, no CO2 retention.  She  is persistently asking for pain meds, query if narcotic OD had anything to do with her syncope.  Labs with hyponatremia, mild. Drop in Hgb, hemoccult negative.  Admitted.   Elwin MochaBlair Nieves Barberi, MD 04/12/14 [phone]

## 2014-04-11 NOTE — ED Notes (Signed)
Patient was at home and had a syncopal episode witnessed by her kids. Patient fell backwards onto bed. Patient children thought she was coding and performed CPR for 3 minutes. EMS arrived and patient was alert to stimuli. EMS report that patient never lost pulses. BP on arrival was 70/30, patient was given 1000 mL's of NS and BP now 99/52. HR 78 NSR. CBG 134.

## 2014-04-11 NOTE — ED Notes (Signed)
CBG 91 

## 2014-04-12 DIAGNOSIS — E871 Hypo-osmolality and hyponatremia: Principal | ICD-10-CM

## 2014-04-12 DIAGNOSIS — D500 Iron deficiency anemia secondary to blood loss (chronic): Secondary | ICD-10-CM

## 2014-04-12 DIAGNOSIS — F319 Bipolar disorder, unspecified: Secondary | ICD-10-CM

## 2014-04-12 DIAGNOSIS — G9340 Encephalopathy, unspecified: Secondary | ICD-10-CM

## 2014-04-12 LAB — CBC
HEMATOCRIT: 29.7 % — AB (ref 36.0–46.0)
Hemoglobin: 9.9 g/dL — ABNORMAL LOW (ref 12.0–15.0)
MCH: 30.9 pg (ref 26.0–34.0)
MCHC: 33.3 g/dL (ref 30.0–36.0)
MCV: 92.8 fL (ref 78.0–100.0)
PLATELETS: 174 10*3/uL (ref 150–400)
RBC: 3.2 MIL/uL — AB (ref 3.87–5.11)
RDW: 13 % (ref 11.5–15.5)
WBC: 4.1 10*3/uL (ref 4.0–10.5)

## 2014-04-12 LAB — COMPREHENSIVE METABOLIC PANEL
ALBUMIN: 3 g/dL — AB (ref 3.5–5.2)
ALT: 16 U/L (ref 0–35)
ANION GAP: 1 — AB (ref 5–15)
AST: 14 U/L (ref 0–37)
Alkaline Phosphatase: 48 U/L (ref 39–117)
BUN: 12 mg/dL (ref 6–23)
CHLORIDE: 107 mmol/L (ref 96–112)
CO2: 23 mmol/L (ref 19–32)
Calcium: 8 mg/dL — ABNORMAL LOW (ref 8.4–10.5)
Creatinine, Ser: 0.75 mg/dL (ref 0.50–1.10)
GFR calc non Af Amer: >90 mL/min (ref >90)
Glucose, Bld: 81 mg/dL (ref 70–99)
POTASSIUM: 4.4 mmol/L (ref 3.5–5.1)
Sodium: 131 mmol/L — ABNORMAL LOW (ref 135–145)
TOTAL PROTEIN: 5.3 g/dL — AB (ref 6.0–8.3)
Total Bilirubin: 0.2 mg/dL — ABNORMAL LOW (ref 0.3–1.2)

## 2014-04-12 MED ORDER — MENTHOL 3 MG MT LOZG
1.0000 | LOZENGE | OROMUCOSAL | Status: DC | PRN
  Filled 2014-04-12: qty 9

## 2014-04-12 MED ORDER — BIOTENE DRY MOUTH MT LIQD: 15.0000 mL | OROMUCOSAL | Status: DC | PRN

## 2014-04-12 NOTE — Progress Notes (Addendum)
[facility] PROGRESS NOTE  Filed Weights   04/11/14 1133 04/12/14 0553  Weight: 98 kg (216 lb 0.8 oz) 97.886 kg (215 lb 12.8 oz)        Intake/Output Summary (Last 24 hours) at 04/12/14 0854 Last data filed at 04/12/14 0600  Gross per 24 hour  Intake   1160 ml  Output   2600 ml  Net  -1440 ml     Assessment/Plan: Syncope and collapse: - Check orthostatics, her specific gravity on UA was 1008 - I will add a urinary sodium and urinary creatinine. - She is on Lasix, at home all go ahead and hold this. She has had no events on telemetry. - Recently had a 2-D echo on 03/11/2014 that showed an ejection fraction of 60-65%  Hypovelemic hyponatremia: - Unclear etiology but she is on SSRI which can cause hyponatremia. Also in the differential may be intravascularly, SIADH, and hypovolemia  check urinary sodium and urinary creatinine. She was given hot salt on admission. - Her sodium improved from 120s 131, hold her IV fluids. We'll try to avoid correcting the sodium too fast. - I agree with restricting her free water intake.  Acute encephalopathy: - Multifactorial likely due to hyponatremia and multiple psychotropic medications. These were held on admission her decreased by 50%.  Vitamin B 12 deficiency/ Iron deficiency anemia due to chronic blood loss: -Receives monthly vitamin B12 injections as well as Feraheme at hematology office  Fibromyalgia/chronic pain: -Hold narcotics -Previously documented with narcotic seeking behavior   Bipolar I disorder: -Current Paxil being held in setting of acute encephalopathy -Xanax dose decreased by 50%  OSA (obstructive sleep apnea): - Cpap  Hypothyroidism - cont synthroid.     Code Status: full Family Communication: none  Disposition Plan: inpatient   Consultants:  none  Procedures: ECHO: one  Antibiotics:  None  HPI/Subjective: More alert today, no complains  Objective: Filed Vitals:   04/11/14 2017  04/11/14 2020 04/12/14 0215 04/12/14 0553  BP: 101/62 103/62 100/64 98/58  Pulse: 78 72 74 65  Temp:   98 F (36.7 C) 97.6 F (36.4 C)  TempSrc:   Oral Oral  Resp:   16 18  Height:      Weight:    97.886 kg (215 lb 12.8 oz)  SpO2: 97% 97% 98% 97%     Exam:  General: Alert, awake, oriented x3, in no acute distress.  HEENT: No bruits, no goiter.  Heart: Regular rate and rhythm. Lungs: Good air movement, clear Abdomen: Soft, nontender, nondistended, positive bowel sounds.  Neuro: Grossly intact, nonfocal.   Data Reviewed: Basic Metabolic Panel:  Recent Labs Lab 04/09/14 1350 04/11/14 0730 04/11/14 1700 04/12/14 0330  NA 132 120* 127* 131*  K 3.8 3.1* 4.4 4.4  CL 93* 95* 99 107  CO2 21 19 20 23   GLUCOSE 86 107* 67* 81  BUN 16 17 13 12   CREATININE 0.8 0.78 0.71 0.75  CALCIUM 8.7 7.4* 8.2* 8.0*   Liver Function Tests:  Recent Labs Lab 04/09/14 1350 04/12/14 0330  AST 24 14  ALT 22 16  ALKPHOS 55 48  BILITOT 0.40 0.2*  PROT 7.2 5.3*  ALBUMIN  --  3.0*   No results for input(s): LIPASE, AMYLASE in the last 168 hours. No results for input(s): AMMONIA in the last 168 hours. CBC:  Recent Labs Lab 04/09/14 1349 04/11/14 0730 04/12/14 0330  WBC 4.8 3.8* 4.1  NEUTROABS 3.5  --   --   HGB 11.4*  9.6* 9.9*  HCT 33.9* 27.8* 29.7*  MCV 94 90.8 92.8  PLT 207 146* 174   Cardiac Enzymes: No results for input(s): CKTOTAL, CKMB, CKMBINDEX, TROPONINI in the last 168 hours. BNP (last 3 results)  Recent Labs  03/11/14 0146  BNP 58.6    ProBNP (last 3 results) No results for input(s): PROBNP in the last 8760 hours.  CBG:  Recent Labs Lab 04/11/14 0756  GLUCAP 91    No results found for this or any previous visit (from the past 240 hour(s)).   Studies: Dg Chest Portable 1 View  04/11/2014   CLINICAL DATA:  Loss of consciousness. Shortness of breath. Hypotension.  EXAM: PORTABLE CHEST - 1 VIEW  COMPARISON:  03/10/2014  FINDINGS: Heart size and  pulmonary vascularity are normal and the lungs are clear. No acute osseous abnormality.  IMPRESSION: No acute abnormalities.   Electronically Signed   By: Francene Boyers M.D.   On: 04/11/2014 07:37    Scheduled Meds: . diclofenac  75 mg Oral BID  . fluticasone  2 spray Each Nare Daily  . gatorade (BH)  240 mL Oral QID  . heparin  5,000 Units Subcutaneous 3 times per day  . levothyroxine  224 mcg Oral Once per day on Sun Tue Wed Thu Fri Sat   And  . [START ON 04/14/2014] levothyroxine  112 mcg Oral Once per day on Mon  . magnesium oxide  200 mg Oral Daily  . modafinil  400 mg Oral q morning - 10a  . morphine  30 mg Oral Q12H  . pantoprazole  40 mg Oral Daily  . sodium chloride  3 mL Intravenous Q12H  . topiramate  100 mg Oral BID   Continuous Infusions: . sodium chloride 100 mL/hr at 04/12/14 0001     FELIZ Rosine Beat  [facility] Pager [phone] If 7PM-7AM, please contact night-coverage at [email or web address], password [facility] 04/12/2014, 8:54 AM  LOS: 1 day

## 2014-04-12 NOTE — Progress Notes (Signed)
Patient continues to refuse CPAP 

## 2014-04-13 DIAGNOSIS — M797 Fibromyalgia: Secondary | ICD-10-CM

## 2014-04-13 LAB — BASIC METABOLIC PANEL
Anion gap: 6 (ref 5–15)
BUN: 17 mg/dL (ref 6–23)
CO2: 23 mmol/L (ref 19–32)
Calcium: 8.5 mg/dL (ref 8.4–10.5)
Chloride: 105 mmol/L (ref 96–112)
Creatinine, Ser: 0.81 mg/dL (ref 0.50–1.10)
GFR calc Af Amer: >90 mL/min (ref >90)
GFR, EST NON AFRICAN AMERICAN: 81 mL/min — AB (ref >90)
Glucose, Bld: 79 mg/dL (ref 70–99)
Potassium: 4.7 mmol/L (ref 3.5–5.1)
SODIUM: 134 mmol/L — AB (ref 135–145)

## 2014-04-13 LAB — OSMOLALITY, URINE: Osmolality, Ur: 294 mOsm/kg — ABNORMAL LOW (ref 390–1090)

## 2014-04-13 LAB — SODIUM, URINE, RANDOM: Sodium, Ur: 70 mmol/L

## 2014-04-13 LAB — CREATININE, URINE, RANDOM: CREATININE, URINE: 32.24 mg/dL

## 2014-04-13 MED ORDER — VITAMIN D-3 125 MCG (5000 UT) PO TABS: 2.0000 | ORAL_TABLET | Freq: Every day | ORAL | Status: DC

## 2014-04-13 MED ORDER — VITAMIN D3 25 MCG (1000 UNIT) PO TABS
2000.0000 [IU] | ORAL_TABLET | Freq: Every day | ORAL | Status: DC
Start: 2014-04-13 — End: 2014-04-14
  Administered 2014-04-13 – 2014-04-14 (×2): 2000 [IU] via ORAL
  Filled 2014-04-13 (×2): qty 2

## 2014-04-13 MED ORDER — SODIUM CHLORIDE 0.9 % IV BOLUS (SEPSIS)
250.0000 mL | Freq: Once | INTRAVENOUS | Status: AC
  Administered 2014-04-13: 250 mL via INTRAVENOUS

## 2014-04-13 MED ORDER — PAROXETINE HCL 20 MG PO TABS
20.0000 mg | ORAL_TABLET | Freq: Two times a day (BID) | ORAL | Status: DC
  Administered 2014-04-13 – 2014-04-14 (×3): 20 mg via ORAL
  Filled 2014-04-13 (×4): qty 1

## 2014-04-13 MED ORDER — CYCLOBENZAPRINE HCL 10 MG PO TABS
10.0000 mg | ORAL_TABLET | Freq: Three times a day (TID) | ORAL | Status: DC
  Administered 2014-04-13 – 2014-04-14 (×3): 10 mg via ORAL
  Filled 2014-04-13 (×6): qty 1

## 2014-04-13 NOTE — Progress Notes (Addendum)
[facility] PROGRESS NOTE  Filed Weights   04/11/14 1133 04/12/14 0553 04/13/14 0627  Weight: 98 kg (216 lb 0.8 oz) 97.886 kg (215 lb 12.8 oz) 96.253 kg (212 lb 3.2 oz)        Intake/Output Summary (Last 24 hours) at 04/13/14 [id number] Last data filed at 04/13/14 [id number]  Gross per 24 hour  Intake 3470.17 ml  Output   3500 ml  Net -29.83 ml     Assessment/Plan: Syncope and collapse: - Check orthostatics, her specific gravity on UA was 1008, likely due to overmedications due to narcotics, BZD, phenergan, flexeril.. - Her fractional excretion of sodium is greater than 1%. - She has had no events on telemetry. - Recently had a 2-D echo on 03/11/2014 that showed an ejection fraction of 60-65%. - pt consult, would send home on lower dose of narcotics or BZD.  Hypervolemia Hyponatremia: - Multifactorial she is on SSRI and consumes > 64 oz daily for dry mouth. Which can cause hyponatremia.  - Her sodium improved from 120s 131, hold her IV fluids. Her basic metabolic panel is pending. - I agree with restricting her free water intake.  Acute encephalopathy: - now resolved - Multifactorial likely due to hyponatremia and multiple psychotropic medications. These were held on admission her decreased by 50%.  Vitamin B 12 deficiency/ Iron deficiency anemia due to chronic blood loss: -Receives monthly vitamin B12 injections as well as Feraheme at hematology office  Fibromyalgia/chronic pain: - resume -Previously documented with narcotic seeking behavior   Bipolar I disorder: -Current Paxil being held in setting of acute encephalopathy -Xanax dose decreased by 50%  OSA (obstructive sleep apnea): - Cpap  Hypothyroidism - cont synthroid.     Code Status: full Family Communication: none  Disposition Plan: inpatient   Consultants:  none  Procedures: ECHO: one  Antibiotics:  None  HPI/Subjective: More alert today, no complains, wants to go  home.  Objective: Filed Vitals:   04/12/14 2031 04/13/14 0151 04/13/14 0558 04/13/14 0627  BP: 106/69 97/64 83/57    Pulse: 66 67 72   Temp: 98.2 F (36.8 C) 98.2 F (36.8 C) 98.2 F (36.8 C)   TempSrc: Oral Oral Oral   Resp: 18 20 20    Height:      Weight:    96.253 kg (212 lb 3.2 oz)  SpO2: 99% 98% 98%      Exam:  General: Alert, awake, oriented x3, in no acute distress.  HEENT: No bruits, no goiter.  Heart: Regular rate and rhythm. Lungs: Good air movement, clear Abdomen: Soft, nontender, nondistended, positive bowel sounds.  Neuro: Grossly intact, nonfocal.   Data Reviewed: Basic Metabolic Panel:  Recent Labs Lab 04/09/14 1350 04/11/14 0730 04/11/14 1700 04/12/14 0330  NA 132 120* 127* 131*  K 3.8 3.1* 4.4 4.4  CL 93* 95* 99 107  CO2 21 19 20 23   GLUCOSE 86 107* 67* 81  BUN 16 17 13 12   CREATININE 0.8 0.78 0.71 0.75  CALCIUM 8.7 7.4* 8.2* 8.0*   Liver Function Tests:  Recent Labs Lab 04/09/14 1350 04/12/14 0330  AST 24 14  ALT 22 16  ALKPHOS 55 48  BILITOT 0.40 0.2*  PROT 7.2 5.3*  ALBUMIN  --  3.0*   No results for input(s): LIPASE, AMYLASE in the last 168 hours. No results for input(s): AMMONIA in the last 168 hours. CBC:  Recent Labs Lab 04/09/14 1349 04/11/14 0730 04/12/14 0330  WBC 4.8 3.8* 4.1  NEUTROABS 3.5  --   --  HGB 11.4* 9.6* 9.9*  HCT 33.9* 27.8* 29.7*  MCV 94 90.8 92.8  PLT 207 146* 174   Cardiac Enzymes: No results for input(s): CKTOTAL, CKMB, CKMBINDEX, TROPONINI in the last 168 hours. BNP (last 3 results)  Recent Labs  03/11/14 0146  BNP 58.6    ProBNP (last 3 results) No results for input(s): PROBNP in the last 8760 hours.  CBG:  Recent Labs Lab 04/11/14 0756  GLUCAP 91    No results found for this or any previous visit (from the past 240 hour(s)).   Studies: No results found.  Scheduled Meds: . diclofenac  75 mg Oral BID  . fluticasone  2 spray Each Nare Daily  . heparin  5,000 Units  Subcutaneous 3 times per day  . levothyroxine  224 mcg Oral Once per day on Sun Tue Wed Thu Fri Sat   And  . [START ON 04/14/2014] levothyroxine  112 mcg Oral Once per day on Mon  . magnesium oxide  200 mg Oral Daily  . modafinil  400 mg Oral q morning - 10a  . morphine  30 mg Oral Q12H  . pantoprazole  40 mg Oral Daily  . sodium chloride  3 mL Intravenous Q12H  . topiramate  100 mg Oral BID   Continuous Infusions: . sodium chloride 10 mL/hr at 04/12/14 1405     FELIZ Rosine Beat  [facility] Pager [phone] If 7PM-7AM, please contact night-coverage at [email or web address], password [email or web address] 04/13/2014, 8:22 AM  LOS: 2 days

## 2014-04-14 DIAGNOSIS — E871 Hypo-osmolality and hyponatremia: Secondary | ICD-10-CM | POA: Diagnosis present

## 2014-04-14 DIAGNOSIS — F1190 Opioid use, unspecified, uncomplicated: Secondary | ICD-10-CM | POA: Diagnosis present

## 2014-04-14 DIAGNOSIS — R631 Polydipsia: Secondary | ICD-10-CM | POA: Diagnosis present

## 2014-04-14 DIAGNOSIS — R55 Syncope and collapse: Secondary | ICD-10-CM

## 2014-04-14 LAB — BASIC METABOLIC PANEL
ANION GAP: 5 (ref 5–15)
BUN: 17 mg/dL (ref 6–23)
CHLORIDE: 104 mmol/L (ref 96–112)
CO2: 25 mmol/L (ref 19–32)
Calcium: 8.3 mg/dL — ABNORMAL LOW (ref 8.4–10.5)
Creatinine, Ser: 0.98 mg/dL (ref 0.50–1.10)
GFR calc Af Amer: 75 mL/min — ABNORMAL LOW (ref >90)
GFR calc non Af Amer: 65 mL/min — ABNORMAL LOW (ref >90)
GLUCOSE: 81 mg/dL (ref 70–99)
POTASSIUM: 5.2 mmol/L — AB (ref 3.5–5.1)
SODIUM: 134 mmol/L — AB (ref 135–145)

## 2014-04-14 MED ORDER — ALPRAZOLAM 2 MG PO TABS: ORAL_TABLET | ORAL | Status: DC

## 2014-04-14 MED ORDER — ALPRAZOLAM 2 MG PO TABS
ORAL_TABLET | ORAL | Status: DC
Start: 2014-04-14 — End: 2014-05-15

## 2014-04-14 MED ORDER — OXYCODONE-ACETAMINOPHEN 10-325 MG PO TABS: 1.0000 | ORAL_TABLET | Freq: Four times a day (QID) | ORAL | Status: DC | PRN

## 2014-04-14 NOTE — Progress Notes (Signed)
Discharge instructions complete. Med list reviewed with patient.

## 2014-04-14 NOTE — Discharge Summary (Signed)
Physician Discharge Summary  Valerie Silva ZOX:[id number] DOB: 05-20-1960 DOA: 04/11/2014  PCP: [name], MD  Admit date: 04/11/2014 Discharge date: 04/14/2014  Time spent: 30 minutes  Recommendations for Outpatient Follow-up:  Discharge home with outpt PCP follow up. ( has appt tomorrow)  please repeat Na and  k level during follow up Patient needs to follow-up with a psychiatrist within 4 weeks to adjust her home medications for anxiety and depression.  Discharge Diagnoses:  Principal Problem:   Acute encephalopathy   Active Problems:   Acute on  Chronic hyponatremia     Polydipsia   Hypokalemia   Syncope and collapse   Hypothyroidism   Fibromyalgia   OSA (obstructive sleep apnea)   Vitamin B 12 deficiency   Bipolar I disorder   Iron deficiency anemia due to chronic blood loss   Chronic pain   Chronic narcotic use    Discharge Condition: fair  Diet recommendation:regular with strict  fluid restriction to 1500 cc  Filed Weights   04/12/14 0553 04/13/14 0627 04/14/14 0602  Weight: 97.886 kg (215 lb 12.8 oz) 96.253 kg (212 lb 3.2 oz) 96.798 kg (213 lb 6.4 oz)    History of present illness:  Please refer to admission H&P for details, in brief, 54 year old female with history of hypothyroidism, fibromyalgia, chronic pain on narcotics, anxiety and depression, multiple hospitalization for hyponatremia thought to be secondary to polydipsia presented with altered mental status. Patient had a witnessed syncope at home. A friend of her son who is an EMT was unable to palpate her pulse and CPR was initiated for about 2 minutes following which she responded but was lethargic and had another 2 minutes of CPR. On arrival to the ED she had low normal blood pressure but otherwise her vitals were stable. Sodium was found to be 120, potassium of 3.1. Patient admitted to hospitalist service on telemetry.   Hospital Course:  Acute encephalopathy Secondary to hyponatremia with sodium  of 120 on presentation. Her home anxiolytic and antidepressant depressions were held and patient placed on fluid restriction with good improvement of her sodium level. Mentation has returned to baseline. Since patient has been on antianxiety medications, and medications and antidepressants for quite some time as outpatient I have resumed them. It was noted that patient was taking her Xanax more frequent than she is supposed to be. I will reduce her Xanax to 2 mg twice a day as needed and at that time. Also will reduce the frequency of Percocet 2 every 6 hours as needed for pain. Resume long acting pain medication and antidepressants. She has an appointment with her PCP tomorrow. She should see her psychiatrist within the next 4 weeks to adjust her home medications.   Hypervolemic hyponatremia Patient drinking about >64 oz water daily, and planing of being dehydrated all the time. This is likely causing hyponatremia. Patient has been hospitalized for the same problem in the past. Sodium has improved from 120-134 since admission after fluid dissection. She has been instructed clearly on restricting her water intake to 1500 cc or less daily. I think Paxil is also contributing to her hyponatremia. She should follow-up with her psychiatrist.  Syncope and collapse Likely secondary to hyponatremia and multiple medications. Orthostasis was negative. Seen by PT and recommended no further home health needs .   Vitamin B 12 deficiency/ Iron deficiency anemia due to chronic blood loss: -Receives monthly vitamin B12 injections as well as Feraheme at hematology office  Fibromyalgia/chronic pain: - resume long acting  pain med ( opana). Reduce percocet to q6hr prn -Previously documented with narcotic seeking behavior  Bipolar I disorder: -paxil held and resumed upo d/c. Follow up with psychiatrist in 4 weeks. -Xanax dose reduced.  OSA (obstructive sleep apnea): - Cpap  Hypothyroidism - cont  synthroid.  hypo/ hyperkalemia Potassium replenished on admission. Now elevated to 5.2. I have held home potassium supplement. Follow-up during outpatient visit  Code Status: full Family Communication: none  Disposition Plan:  home with outpt follow up   Consultants:  none  Procedures: none  Antibiotics:  None   Discharge Exam: Filed Vitals:   04/14/14 0602  BP: 99/59  Pulse: 62  Temp: 98 F (36.7 C)  Resp: 18    General: Middle aged female in no acute distress HEENT: No pallor, moist oral mucosa, supple neck  Cardiovascular: N S1&S2, no murmurs Respiratory: clear b/l, no added sounds GI: soft, NT, ND, BS+ Musculoskeletal: warm, no edema CNS: alert and oriented.   Discharge Instructions    Current Discharge Medication List    CONTINUE these medications which have CHANGED   Details  alprazolam (XANAX) 2 MG tablet Take one  tablet by mouth twice daily as needed for anxiety; Take one tablet by mouth at bedtime as needed for sleep or anxiety Qty: as prescribed outpt ( no new prescription given)   Associated Diagnoses: Depression with anxiety  oxyCODONE-acetaminophen (PERCOCET) 10-325 MG per tablet Take 1 tablet by mouth every 6 hours hours as needed for pain.      CONTINUE these medications which have NOT CHANGED   Details  acarbose (PRECOSE) 25 MG tablet Take 25 mg by mouth daily as needed (for low blood sugar).    Associated Diagnoses: Depression with anxiety; Pernicious anemia; Iron deficiency anemia, unspecified    Cholecalciferol (VITAMIN D-3) 5000 UNITS TABS Take 2 tablets by mouth daily.    Cyanocobalamin (VITAMIN B-12 IJ) Inject as directed every 28 (twenty-eight) days.    cyclobenzaprine (FLEXERIL) 10 MG tablet Take 1 tablet (10 mg total) by mouth 3 (three) times daily. Qty: 90 tablet, Refills: 0    diclofenac (VOLTAREN) 75 MG EC tablet Take 75 mg by mouth 2 (two) times daily.    docusate sodium 100 MG CAPS Take 100 mg by mouth 2 (two) times  daily. Qty: 10 capsule, Refills: 0    furosemide (LASIX) 20 MG tablet Take 20 mg by mouth daily at 12 noon.    levothyroxine (SYNTHROID, LEVOTHROID) 112 MCG tablet Take 112-224 mcg by mouth daily before breakfast. Pt takes every day, but on Monday she takes 112 mcg.    loratadine (CLARITIN) 10 MG tablet Take 1 tablet (10 mg total) by mouth 2 (two) times daily.    modafinil (PROVIGIL) 200 MG tablet Take 2 tablets (400 mg total) by mouth every morning. Qty: 60 tablet, Refills: 5   Associated Diagnoses: Depression with anxiety; Pernicious anemia    mometasone (NASONEX) 50 MCG/ACT nasal spray Place 2 sprays into the nose daily.    omeprazole (PRILOSEC) 40 MG capsule Take 1 capsule (40 mg total) by mouth daily. Qty: 30 capsule, Refills: 1         oxymorphone (OPANA ER) 30 MG 12 hr tablet Take 30 mg by mouth every 12 (twelve) hours.   Associated Diagnoses: Pernicious anemia; Iron deficiency anemia due to chronic blood loss    PARoxetine (PAXIL) 20 MG tablet Take 20 mg by mouth 2 (two) times daily.    promethazine (PHENERGAN) 25 MG tablet Take 25  mg by mouth every 8 (eight) hours as needed for nausea.     SUMAtriptan (IMITREX) 100 MG tablet Take 100 mg by mouth every 2 (two) hours as needed for migraine.     topiramate (TOPAMAX) 200 MG tablet Take 1 tablet (200 mg total) by mouth at bedtime. Qty: 30 tablet, Refills: 0    valACYclovir (VALTREX) 1000 MG tablet Take 1,000 mg by mouth daily as needed (for flare ups).     Magnesium 250 MG TABS Take 1 tablet by mouth daily.      STOP taking these medications     potassium chloride (KLOR-CON) 20 MEQ packet        Allergies  Allergen Reactions  . Iodine Other (See Comments)    Sensitive when used gynecologically (intra-vaginally).  . Iron Itching    Iron taken by mouth causes this reaction. The patient has had several iron infusions plus Feraheme that has not caused any side effects.   . Lovaza [Omega-3-Acid Ethyl Esters]  Itching  . Soma [Carisoprodol] Other (See Comments)    Pt states that this medication causes panic attacks.   . Sulfonamide Derivatives Other (See Comments)    Sensitive when used intra-vaginally.  . Benazepril Hcl Other (See Comments)    Reaction:  Severe cough   . Codeine Other (See Comments)    Pt states that this medication causes rapid heartbeat.   Roselee Nova. Lyrica [Pregabalin]     Pt can not remember  . Penicillins Other (See Comments)    Reaction: Severe family allergy.  Family physician long ago stated she shouldn't take this med since mother and brother nearly died from anaphylaxis.  Marland Kitchen. Zolpidem Tartrate Itching and Other (See Comments)    Pt states that this medication causes blackouts.   . Amitriptyline Other (See Comments)    Pt states that this medication causes sleep walking.    Follow-up Information    Follow up with [name],[name], MD. Schedule an appointment as soon as possible for a visit in 1 week.   Specialty:  Family Medicine   Contact information:   [phone] [address] [place] [address] [phone]        The results of significant diagnostics from this hospitalization (including imaging, microbiology, ancillary and laboratory) are listed below for reference.    Significant Diagnostic Studies: Dg Chest Portable 1 View  04/11/2014   CLINICAL DATA:  Loss of consciousness. Shortness of breath. Hypotension.  EXAM: PORTABLE CHEST - 1 VIEW  COMPARISON:  03/10/2014  FINDINGS: Heart size and pulmonary vascularity are normal and the lungs are clear. No acute osseous abnormality.  IMPRESSION: No acute abnormalities.   Electronically Signed   By: Francene BoyersJames  Maxwell M.D.   On: 04/11/2014 07:37    Microbiology: No results found for this or any previous visit (from the past 240 hour(s)).   Labs: Basic Metabolic Panel:  Recent Labs Lab 04/11/14 0730 04/11/14 1700 04/12/14 0330 04/13/14 1200 04/14/14 0405  NA 120* 127* 131* 134* 134*  K 3.1* 4.4 4.4 4.7 5.2*   CL 95* 99 107 105 104  CO2 19 20 23 23 25   GLUCOSE 107* 67* 81 79 81  BUN 17 13 12 17 17   CREATININE 0.78 0.71 0.75 0.81 0.98  CALCIUM 7.4* 8.2* 8.0* 8.5 8.3*   Liver Function Tests:  Recent Labs Lab 04/09/14 1350 04/12/14 0330  AST 24 14  ALT 22 16  ALKPHOS 55 48  BILITOT 0.40 0.2*  PROT 7.2 5.3*  ALBUMIN  --  3.0*   No results for input(s): LIPASE, AMYLASE in the last 168 hours. No results for input(s): AMMONIA in the last 168 hours. CBC:  Recent Labs Lab 04/09/14 1349 04/11/14 0730 04/12/14 0330  WBC 4.8 3.8* 4.1  NEUTROABS 3.5  --   --   HGB 11.4* 9.6* 9.9*  HCT 33.9* 27.8* 29.7*  MCV 94 90.8 92.8  PLT 207 146* 174   Cardiac Enzymes: No results for input(s): CKTOTAL, CKMB, CKMBINDEX, TROPONINI in the last 168 hours. BNP: BNP (last 3 results)  Recent Labs  03/11/14 0146  BNP 58.6    ProBNP (last 3 results) No results for input(s): PROBNP in the last 8760 hours.  CBG:  Recent Labs Lab 04/11/14 0756  GLUCAP 91       Signed:  Shenea Giacobbe  [facility] 04/14/2014, 10:22 AM

## 2014-04-14 NOTE — Evaluation (Signed)
Physical Therapy Evaluation Patient Details Name: Valerie Silva MRN: [id number] DOB: 09-Aug-1960 Today's Date: 04/14/2014   History of Present Illness     54 year old with chronic pain on narcotics and benzos came into the hospital because of syncope and collapse. Pt with recent history of hospital stay due to hypnatremia.  Clinical Impression  Pt functioning near baseline. Pt with low activity at baseline due to chronic back pain. Pt functioning at mod I and good awareness of deficits. Pt safe to d/c home once medically cleared. Pt with good home set up and support of son 24/7. Acute PT to follow to improve overall strength and endurance while in hospital.    Follow Up Recommendations No PT follow up;Supervision - Intermittent    Equipment Recommendations  None recommended by PT    Recommendations for Other Services       Precautions / Restrictions Precautions Precautions: Fall Restrictions Weight Bearing Restrictions: No      Mobility  Bed Mobility Overal bed mobility: Modified Independent             General bed mobility comments: used mattress to pull self up, educated on log roll technique but she reports her chest is too sore  Transfers Overall transfer level: Needs assistance Equipment used: None Transfers: Sit to/from Stand Sit to Stand: Min guard         General transfer comment: pt with good technique, increased time  Ambulation/Gait Ambulation/Gait assistance: Min guard Ambulation Distance (Feet): 120 Feet Assistive device: None Gait Pattern/deviations: WFL(Within Functional Limits) Gait velocity: decreased   General Gait Details: pt with no episodes of LOB, slow cadence, shuffled steps/decreased step length  Stairs Stairs: Yes Stairs assistance: Min guard Stair Management: One rail Right;Forwards;Step to pattern Number of Stairs: 2 General stair comments: pt with good step to techinque  Wheelchair Mobility    Modified Rankin (Stroke  Patients Only)       Balance Overall balance assessment: Modified Independent                                           Pertinent Vitals/Pain Pain Assessment: 0-10 Pain Score: 10-Worst pain ever Pain Location: back and L side of chest Pain Descriptors / Indicators: Constant;Sharp;Aching Pain Intervention(s): Monitored during session;Patient requesting pain meds-RN notified    Home Living Family/patient expects to be discharged to:: Private residence Living Arrangements: Children Available Help at Discharge: Family;Available 24 hours/day Type of Home: Apartment Home Access: Stairs to enter Entrance Stairs-Rails: Right Entrance Stairs-Number of Steps: 2 Home Layout: One level Home Equipment: Cane - single point;Wheelchair - manual Additional Comments: pt reports son to currenlty be living with her    Prior Function Level of Independence: Independent with assistive device(s)         Comments: uses cane, reprots she doesn't start her day until 10-11am. She stays in bed most of the time due her back pain.     Hand Dominance        Extremity/Trunk Assessment   Upper Extremity Assessment: Overall WFL for tasks assessed           Lower Extremity Assessment: Generalized weakness      Cervical / Trunk Assessment: Normal  Communication   Communication: No difficulties  Cognition Arousal/Alertness: Lethargic;Suspect due to medications Behavior During Therapy: Flat affect Overall Cognitive Status:  (slow processing)  General Comments General comments (skin integrity, edema, etc.): Pt assisted to bathroom, pt mod I with hygiene. pt with multiple bruising on LEs and she reports this to be from her dog.    Exercises        Assessment/Plan    PT Assessment Patient needs continued PT services  PT Diagnosis Generalized weakness   PT Problem List Decreased strength;Decreased range of motion;Decreased activity  tolerance;Decreased balance;Decreased mobility  PT Treatment Interventions Gait training;Stair training;Functional mobility training;Therapeutic activities;Therapeutic exercise;Balance training   PT Goals (Current goals can be found in the Care Plan section) Acute Rehab PT Goals Patient Stated Goal: home PT Goal Formulation: With patient Time For Goal Achievement: 04/21/14 Potential to Achieve Goals: Good    Frequency Min 3X/week   Barriers to discharge        Co-evaluation               End of Session Equipment Utilized During Treatment: Gait belt Activity Tolerance: Patient tolerated treatment well Patient left: in bed;with call bell/phone within reach (pt deferred sitting in chair due to back pain) Nurse Communication: Mobility status         Time: [id number] PT Time Calculation (min) (ACUTE ONLY): 35 min   Charges:   PT Evaluation $Initial PT Evaluation Tier I: 1 Procedure PT Treatments $Gait Training: 8-22 mins   PT G CodesMarcene Brawn:        [name] [name] 04/14/2014, 8:42 AM   Lewis ShockAshly Shayley Medlin, PT, DPT Pager #: [phone] Office #: [phone]

## 2014-04-14 NOTE — Care Management Note (Signed)
    Page 1 of 1   04/14/2014     3:12:06 PM CARE MANAGEMENT NOTE 04/14/2014  Patient:  Valerie Silva,Valerie Silva   Account Number:  [id number]  Date Initiated:  04/14/2014  Documentation initiated by:  Adrieanna Boteler  Subjective/Objective Assessment:   Pt adm on 04/11/14 with hyponatremia, syncope.  PTA, pt resides at home with family.     Action/Plan:   Will follow for dc needs as pt progresses.  PT/OT recommending no OP follow up.   Anticipated DC Date:  04/14/2014   Anticipated DC Plan:  HOME/SELF CARE      DC Planning Services  CM consult      Choice offered to / List presented to:             Status of service:  Completed, signed off Medicare Important Message given?  YES (If response is "NO", the following Medicare IM given date fields will be blank) Date Medicare IM given:  04/14/2014 Medicare IM given by:  Zyair Russi Date Additional Medicare IM given:   Additional Medicare IM given by:    Discharge Disposition:  HOME/SELF CARE  Per UR Regulation:  Reviewed for med. necessity/level of care/duration of stay  If discussed at Long Length of Stay Meetings, dates discussed:    Comments:

## 2014-04-14 NOTE — Discharge Instructions (Signed)
Hyponatremia ° Hyponatremia is when the salt (sodium) in your blood is low. When salt becomes low, your cells take in extra water and puff up (swell). The puffiness can happen in the whole body. It mostly affects the brain and is very serious.  °HOME CARE °· Only take medicine as told by your doctor. °· Follow any diet instructions you were given. This includes limiting how much fluid you drink. °· Keep all doctor visits for tests as told. °· Avoid alcohol and drugs. °GET HELP RIGHT AWAY IF: °· You start to twitch and shake (seize). °· You pass out (faint). °· You continue to have watery poop (diarrhea) or you throw up (vomit). °· You feel sick to your stomach (nauseous). °· You are tired (fatigued), have a headache, are confused, or feel weak. °· Your problems that first brought you to the doctor come back. °· You have trouble following your diet instructions. °MAKE SURE YOU:  °· Understand these instructions. °· Will watch your condition. °· Will get help right away if you are not doing well or get worse. °Document Released: 09/08/2010 Document Revised: 03/21/2011 Document Reviewed: 09/08/2010 °[employer]® Patient Information ©2015 [employer]. This information is not intended to replace advice given to you by your health care provider. Make sure you discuss any questions you have with your health care provider. ° °

## 2014-04-16 ENCOUNTER — Other Ambulatory Visit: Payer: Self-pay | Admitting: Family Medicine

## 2014-04-16 DIAGNOSIS — N6321 Unspecified lump in the left breast, upper outer quadrant: Secondary | ICD-10-CM

## 2014-04-21 ENCOUNTER — Other Ambulatory Visit: Payer: Medicare Other

## 2014-05-05 ENCOUNTER — Ambulatory Visit: Payer: Medicare Other | Admitting: Hematology & Oncology

## 2014-05-05 ENCOUNTER — Other Ambulatory Visit: Payer: Medicare Other

## 2014-05-13 ENCOUNTER — Observation Stay (HOSPITAL_COMMUNITY)
Admission: EM | Admit: 2014-05-13 | Discharge: 2014-05-15 | Disposition: A | Discharge status: Skilled Nursing Facility | Payer: Medicare Other | Attending: Family Medicine | Admitting: Family Medicine

## 2014-05-13 ENCOUNTER — Emergency Department (HOSPITAL_COMMUNITY): Payer: Medicare Other

## 2014-05-13 ENCOUNTER — Encounter (HOSPITAL_COMMUNITY): Payer: Self-pay | Admitting: *Deleted

## 2014-05-13 DIAGNOSIS — E11649 Type 2 diabetes mellitus with hypoglycemia without coma: Secondary | ICD-10-CM | POA: Insufficient documentation

## 2014-05-13 DIAGNOSIS — Z6831 Body mass index (BMI) 31.0-31.9, adult: Secondary | ICD-10-CM | POA: Diagnosis not present

## 2014-05-13 DIAGNOSIS — F418 Other specified anxiety disorders: Secondary | ICD-10-CM

## 2014-05-13 DIAGNOSIS — E871 Hypo-osmolality and hyponatremia: Secondary | ICD-10-CM | POA: Diagnosis not present

## 2014-05-13 DIAGNOSIS — D500 Iron deficiency anemia secondary to blood loss (chronic): Secondary | ICD-10-CM | POA: Diagnosis present

## 2014-05-13 DIAGNOSIS — D510 Vitamin B12 deficiency anemia due to intrinsic factor deficiency: Secondary | ICD-10-CM | POA: Diagnosis not present

## 2014-05-13 DIAGNOSIS — G4733 Obstructive sleep apnea (adult) (pediatric): Secondary | ICD-10-CM | POA: Diagnosis present

## 2014-05-13 DIAGNOSIS — W19XXXA Unspecified fall, initial encounter: Secondary | ICD-10-CM | POA: Diagnosis not present

## 2014-05-13 DIAGNOSIS — R296 Repeated falls: Secondary | ICD-10-CM | POA: Insufficient documentation

## 2014-05-13 DIAGNOSIS — Z91041 Radiographic dye allergy status: Secondary | ICD-10-CM | POA: Insufficient documentation

## 2014-05-13 DIAGNOSIS — Y92009 Unspecified place in unspecified non-institutional (private) residence as the place of occurrence of the external cause: Secondary | ICD-10-CM | POA: Insufficient documentation

## 2014-05-13 DIAGNOSIS — S32049A Unspecified fracture of fourth lumbar vertebra, initial encounter for closed fracture: Secondary | ICD-10-CM | POA: Diagnosis not present

## 2014-05-13 DIAGNOSIS — E669 Obesity, unspecified: Secondary | ICD-10-CM | POA: Diagnosis not present

## 2014-05-13 DIAGNOSIS — K219 Gastro-esophageal reflux disease without esophagitis: Secondary | ICD-10-CM | POA: Diagnosis present

## 2014-05-13 DIAGNOSIS — Z9049 Acquired absence of other specified parts of digestive tract: Secondary | ICD-10-CM | POA: Diagnosis not present

## 2014-05-13 DIAGNOSIS — E039 Hypothyroidism, unspecified: Secondary | ICD-10-CM | POA: Diagnosis present

## 2014-05-13 DIAGNOSIS — F1190 Opioid use, unspecified, uncomplicated: Secondary | ICD-10-CM | POA: Diagnosis present

## 2014-05-13 DIAGNOSIS — F419 Anxiety disorder, unspecified: Secondary | ICD-10-CM | POA: Insufficient documentation

## 2014-05-13 DIAGNOSIS — Z981 Arthrodesis status: Secondary | ICD-10-CM | POA: Diagnosis not present

## 2014-05-13 DIAGNOSIS — M549 Dorsalgia, unspecified: Secondary | ICD-10-CM | POA: Diagnosis present

## 2014-05-13 DIAGNOSIS — M810 Age-related osteoporosis without current pathological fracture: Secondary | ICD-10-CM | POA: Diagnosis not present

## 2014-05-13 DIAGNOSIS — F319 Bipolar disorder, unspecified: Secondary | ICD-10-CM | POA: Diagnosis not present

## 2014-05-13 DIAGNOSIS — E119 Type 2 diabetes mellitus without complications: Secondary | ICD-10-CM

## 2014-05-13 DIAGNOSIS — R0789 Other chest pain: Secondary | ICD-10-CM

## 2014-05-13 DIAGNOSIS — S32000A Wedge compression fracture of unspecified lumbar vertebra, initial encounter for closed fracture: Secondary | ICD-10-CM | POA: Diagnosis not present

## 2014-05-13 DIAGNOSIS — Z79891 Long term (current) use of opiate analgesic: Secondary | ICD-10-CM | POA: Insufficient documentation

## 2014-05-13 DIAGNOSIS — F1099 Alcohol use, unspecified with unspecified alcohol-induced disorder: Secondary | ICD-10-CM | POA: Insufficient documentation

## 2014-05-13 DIAGNOSIS — Z888 Allergy status to other drugs, medicaments and biological substances status: Secondary | ICD-10-CM | POA: Diagnosis not present

## 2014-05-13 DIAGNOSIS — Z9884 Bariatric surgery status: Secondary | ICD-10-CM | POA: Insufficient documentation

## 2014-05-13 DIAGNOSIS — Z886 Allergy status to analgesic agent status: Secondary | ICD-10-CM | POA: Diagnosis not present

## 2014-05-13 DIAGNOSIS — Z880 Allergy status to penicillin: Secondary | ICD-10-CM | POA: Diagnosis not present

## 2014-05-13 DIAGNOSIS — D509 Iron deficiency anemia, unspecified: Secondary | ICD-10-CM | POA: Insufficient documentation

## 2014-05-13 HISTORY — DX: Bariatric surgery status: Z98.84

## 2014-05-13 HISTORY — DX: Gastro-esophageal reflux disease without esophagitis: K21.9

## 2014-05-13 HISTORY — DX: Gastrointestinal hemorrhage, unspecified: K92.2

## 2014-05-13 LAB — URINALYSIS, ROUTINE W REFLEX MICROSCOPIC
Bilirubin Urine: NEGATIVE
GLUCOSE, UA: NEGATIVE mg/dL
Hgb urine dipstick: NEGATIVE
KETONES UR: NEGATIVE mg/dL
Leukocytes, UA: NEGATIVE
Nitrite: NEGATIVE
PH: 6.5 (ref 5.0–8.0)
Protein, ur: NEGATIVE mg/dL
Specific Gravity, Urine: 1.011 (ref 1.005–1.030)
UROBILINOGEN UA: 1 mg/dL (ref 0.0–1.0)

## 2014-05-13 LAB — RAPID URINE DRUG SCREEN, HOSP PERFORMED
Amphetamines: NOT DETECTED
Barbiturates: NOT DETECTED
Benzodiazepines: POSITIVE — AB
Cocaine: NOT DETECTED
Opiates: POSITIVE — AB
Tetrahydrocannabinol: NOT DETECTED

## 2014-05-13 LAB — CBC WITH DIFFERENTIAL/PLATELET
BASOS PCT: 0 % (ref 0–1)
Basophils Absolute: 0 10*3/uL (ref 0.0–0.1)
Eosinophils Absolute: 0.1 10*3/uL (ref 0.0–0.7)
Eosinophils Relative: 1 % (ref 0–5)
HEMATOCRIT: 31.1 % — AB (ref 36.0–46.0)
HEMOGLOBIN: 10 g/dL — AB (ref 12.0–15.0)
LYMPHS ABS: 1 10*3/uL (ref 0.7–4.0)
Lymphocytes Relative: 19 % (ref 12–46)
MCH: 30.8 pg (ref 26.0–34.0)
MCHC: 32.2 g/dL (ref 30.0–36.0)
MCV: 95.7 fL (ref 78.0–100.0)
MONOS PCT: 6 % (ref 3–12)
Monocytes Absolute: 0.3 10*3/uL (ref 0.1–1.0)
Neutro Abs: 3.8 10*3/uL (ref 1.7–7.7)
Neutrophils Relative %: 74 % (ref 43–77)
Platelets: 198 10*3/uL (ref 150–400)
RBC: 3.25 MIL/uL — ABNORMAL LOW (ref 3.87–5.11)
RDW: 13.4 % (ref 11.5–15.5)
WBC: 5.2 10*3/uL (ref 4.0–10.5)

## 2014-05-13 LAB — BASIC METABOLIC PANEL
Anion gap: 6 (ref 5–15)
BUN: 17 mg/dL (ref 6–20)
CHLORIDE: 102 mmol/L (ref 101–111)
CO2: 23 mmol/L (ref 22–32)
Calcium: 8.5 mg/dL — ABNORMAL LOW (ref 8.9–10.3)
Creatinine, Ser: 0.67 mg/dL (ref 0.44–1.00)
GFR calc non Af Amer: >60 mL/min (ref >60)
Glucose, Bld: 68 mg/dL — ABNORMAL LOW (ref 70–99)
POTASSIUM: 4.2 mmol/L (ref 3.5–5.1)
Sodium: 131 mmol/L — ABNORMAL LOW (ref 135–145)

## 2014-05-13 LAB — GLUCOSE, CAPILLARY: GLUCOSE-CAPILLARY: 58 mg/dL — AB (ref 70–99)

## 2014-05-13 LAB — ETHANOL

## 2014-05-13 LAB — PROTIME-INR
INR: 1.09 (ref 0.00–1.49)
PROTHROMBIN TIME: 14.3 s (ref 11.6–15.2)

## 2014-05-13 MED ORDER — ALPRAZOLAM 0.25 MG PO TABS
0.2500 mg | ORAL_TABLET | Freq: Two times a day (BID) | ORAL | Status: DC | PRN
  Administered 2014-05-13 – 2014-05-15 (×2): 0.25 mg via ORAL
  Filled 2014-05-13 (×2): qty 1

## 2014-05-13 MED ORDER — LEVOTHYROXINE SODIUM 112 MCG PO TABS: 112.0000 ug | ORAL_TABLET | ORAL | Status: DC

## 2014-05-13 MED ORDER — HYDROMORPHONE HCL 1 MG/ML IJ SOLN
1.0000 mg | Freq: Once | INTRAMUSCULAR | Status: AC
  Administered 2014-05-13: 1 mg via INTRAMUSCULAR
  Filled 2014-05-13: qty 1

## 2014-05-13 MED ORDER — SUMATRIPTAN SUCCINATE 100 MG PO TABS
100.0000 mg | ORAL_TABLET | ORAL | Status: DC | PRN
  Filled 2014-05-13: qty 1

## 2014-05-13 MED ORDER — SODIUM CHLORIDE 0.9 % IJ SOLN: 3.0000 mL | Freq: Two times a day (BID) | INTRAMUSCULAR | Status: DC

## 2014-05-13 MED ORDER — FLUTICASONE PROPIONATE 50 MCG/ACT NA SUSP
1.0000 | Freq: Every day | NASAL | Status: DC
  Administered 2014-05-14 – 2014-05-15 (×3): 1 via NASAL
  Filled 2014-05-13: qty 16

## 2014-05-13 MED ORDER — HYDROMORPHONE HCL 1 MG/ML IJ SOLN
1.0000 mg | Freq: Once | INTRAMUSCULAR | Status: AC
  Administered 2014-05-13: 1 mg via INTRAVENOUS
  Filled 2014-05-13: qty 1

## 2014-05-13 MED ORDER — MODAFINIL 200 MG PO TABS
400.0000 mg | ORAL_TABLET | Freq: Every morning | ORAL | Status: DC
  Administered 2014-05-14 – 2014-05-15 (×2): 400 mg via ORAL
  Filled 2014-05-13 (×2): qty 2

## 2014-05-13 MED ORDER — LEVOTHYROXINE SODIUM 112 MCG PO TABS
224.0000 ug | ORAL_TABLET | ORAL | Status: DC
  Administered 2014-05-14 – 2014-05-15 (×2): 224 ug via ORAL
  Filled 2014-05-13 (×4): qty 2

## 2014-05-13 MED ORDER — MAGNESIUM OXIDE 400 (241.3 MG) MG PO TABS
400.0000 mg | ORAL_TABLET | Freq: Every day | ORAL | Status: DC
Start: 2014-05-13 — End: 2014-05-15
  Administered 2014-05-14 – 2014-05-15 (×2): 400 mg via ORAL
  Filled 2014-05-13 (×2): qty 1

## 2014-05-13 MED ORDER — DICLOFENAC SODIUM 75 MG PO TBEC
75.0000 mg | DELAYED_RELEASE_TABLET | Freq: Two times a day (BID) | ORAL | Status: DC
  Administered 2014-05-13 – 2014-05-15 (×4): 75 mg via ORAL
  Filled 2014-05-13 (×5): qty 1

## 2014-05-13 MED ORDER — CYCLOBENZAPRINE HCL 10 MG PO TABS
10.0000 mg | ORAL_TABLET | Freq: Three times a day (TID) | ORAL | Status: DC
  Administered 2014-05-13 – 2014-05-15 (×6): 10 mg via ORAL
  Filled 2014-05-13 (×6): qty 1

## 2014-05-13 MED ORDER — SODIUM CHLORIDE 0.9 % IV SOLN
250.0000 mL | INTRAVENOUS | Status: DC | PRN
Start: 2014-05-13 — End: 2014-05-14

## 2014-05-13 MED ORDER — ACETAMINOPHEN 650 MG RE SUPP: 650.0000 mg | Freq: Four times a day (QID) | RECTAL | Status: DC | PRN

## 2014-05-13 MED ORDER — ACETAMINOPHEN 325 MG PO TABS: 650.0000 mg | ORAL_TABLET | Freq: Four times a day (QID) | ORAL | Status: DC | PRN

## 2014-05-13 MED ORDER — AMLODIPINE BESYLATE 5 MG PO TABS
5.0000 mg | ORAL_TABLET | Freq: Every day | ORAL | Status: DC
  Administered 2014-05-14 – 2014-05-15 (×2): 5 mg via ORAL
  Filled 2014-05-13 (×2): qty 1

## 2014-05-13 MED ORDER — MORPHINE SULFATE ER 15 MG PO TBCR
15.0000 mg | EXTENDED_RELEASE_TABLET | Freq: Two times a day (BID) | ORAL | Status: DC
  Administered 2014-05-13 – 2014-05-15 (×4): 15 mg via ORAL
  Filled 2014-05-13 (×4): qty 1

## 2014-05-13 MED ORDER — TOPIRAMATE 100 MG PO TABS
200.0000 mg | ORAL_TABLET | Freq: Every day | ORAL | Status: DC
  Administered 2014-05-13 – 2014-05-14 (×2): 200 mg via ORAL
  Filled 2014-05-13 (×3): qty 2

## 2014-05-13 MED ORDER — LIDOCAINE 5 % EX PTCH
1.0000 | MEDICATED_PATCH | Freq: Every day | CUTANEOUS | Status: DC
  Filled 2014-05-13 (×3): qty 1

## 2014-05-13 MED ORDER — ONDANSETRON HCL 4 MG/2ML IJ SOLN
4.0000 mg | Freq: Four times a day (QID) | INTRAMUSCULAR | Status: DC | PRN
Start: 2014-05-13 — End: 2014-05-15

## 2014-05-13 MED ORDER — SODIUM CHLORIDE 0.9 % IV SOLN
INTRAVENOUS | Status: DC
  Administered 2014-05-13 – 2014-05-14 (×3): via INTRAVENOUS

## 2014-05-13 MED ORDER — PAROXETINE HCL 20 MG PO TABS
20.0000 mg | ORAL_TABLET | Freq: Two times a day (BID) | ORAL | Status: DC
  Administered 2014-05-13 – 2014-05-15 (×4): 20 mg via ORAL
  Filled 2014-05-13 (×4): qty 1

## 2014-05-13 MED ORDER — ALUM & MAG HYDROXIDE-SIMETH 200-200-20 MG/5ML PO SUSP: 30.0000 mL | Freq: Four times a day (QID) | ORAL | Status: DC | PRN

## 2014-05-13 MED ORDER — SODIUM CHLORIDE 0.9 % IV SOLN: 250.0000 mL | INTRAVENOUS | Status: DC | PRN

## 2014-05-13 MED ORDER — HEPARIN SODIUM (PORCINE) 5000 UNIT/ML IJ SOLN
5000.0000 [IU] | Freq: Three times a day (TID) | INTRAMUSCULAR | Status: DC
  Administered 2014-05-13 – 2014-05-14 (×3): 5000 [IU] via SUBCUTANEOUS
  Filled 2014-05-13 (×2): qty 1

## 2014-05-13 MED ORDER — VITAMIN D 1000 UNITS PO TABS
10000.0000 [IU] | ORAL_TABLET | Freq: Every day | ORAL | Status: DC
  Administered 2014-05-14 – 2014-05-15 (×2): 10000 [IU] via ORAL
  Filled 2014-05-13 (×2): qty 10

## 2014-05-13 MED ORDER — DOCUSATE SODIUM 100 MG PO CAPS
100.0000 mg | ORAL_CAPSULE | ORAL | Status: DC
  Administered 2014-05-14: 100 mg via ORAL
  Filled 2014-05-13: qty 1

## 2014-05-13 MED ORDER — SODIUM CHLORIDE 0.9 % IJ SOLN: 3.0000 mL | INTRAMUSCULAR | Status: DC | PRN

## 2014-05-13 MED ORDER — PANTOPRAZOLE SODIUM 40 MG PO TBEC
40.0000 mg | DELAYED_RELEASE_TABLET | Freq: Every day | ORAL | Status: DC
  Administered 2014-05-14 – 2014-05-15 (×2): 40 mg via ORAL
  Filled 2014-05-13 (×2): qty 1

## 2014-05-13 MED ORDER — HYDRALAZINE HCL 20 MG/ML IJ SOLN: 5.0000 mg | INTRAMUSCULAR | Status: DC | PRN

## 2014-05-13 MED ORDER — LORATADINE 10 MG PO TABS: 10.0000 mg | ORAL_TABLET | Freq: Every day | ORAL | Status: DC | PRN

## 2014-05-13 MED ORDER — OXYCODONE-ACETAMINOPHEN 5-325 MG PO TABS
1.0000 | ORAL_TABLET | Freq: Three times a day (TID) | ORAL | Status: DC | PRN
  Administered 2014-05-13 – 2014-05-14 (×2): 1 via ORAL
  Filled 2014-05-13 (×3): qty 1

## 2014-05-13 MED ORDER — ONDANSETRON HCL 4 MG PO TABS: 4.0000 mg | ORAL_TABLET | Freq: Four times a day (QID) | ORAL | Status: DC | PRN

## 2014-05-13 MED ORDER — INSULIN ASPART 100 UNIT/ML ~~LOC~~ SOLN: 0.0000 [IU] | Freq: Three times a day (TID) | SUBCUTANEOUS | Status: DC

## 2014-05-13 NOTE — Progress Notes (Signed)
Pt refused nocturnal CPAP.  Pt states that she has never been able to tolerate wearing CPAP due to wearing anything on her head causing her to have migraines.  Pt instructed to contact RT shoud she change her mind or need further asisstance.

## 2014-05-13 NOTE — Progress Notes (Signed)
Hypoglycemic Event  CBG: 58  Treatment: 15 GM carbohydrate snack  Symptoms: None  Follow-up CBG: Time: CBG Result:73  Possible Reasons for Event: Unknown  Comments/MD notified:N/A    Valerie Silva [name]  Remember to initiate Hypoglycemia Order Set & complete

## 2014-05-13 NOTE — H&P (Signed)
[facility] History and Physical  Valerie Silva ZOX:[id number] DOB: February 09, 1960 DOA: 05/13/2014  Referring physician: ED physician PCP: [name], MD  Specialists:   Chief Complaint: Fall and back pain  HPI: Valerie Silva is a 55 y.o. female with past medical history diabetes mellitus, GERD, hypothyroidism, depression, anxiety, anemia, osteoporosis, arthritis, history of gastric bypass, history of GI bleeding, iron deficiency anemia, pernicious anemia, who presents with fall and back pain.  Patient has chronic back pain. She has been taking narcotic (Percocet and Opana) chronically. She is also taking Xanax for anxiety. Per patient's son, she has chronic intermittent fall at home. Her son believe that it is most likely due to narcotic and Xanax use. Sometimes patient walks after taking xanaxa at very drowsy condition per his son.   Patient states she fell twice since last evening. The first time she got up from her bed and stumbled. Patient had taken her sleeping medications. Patient fell forward hitting the right side of her chest. Later in the evening. patient walked downstairs and was looking out the back door when her dog jumped up on her. The patient then fell backwards striking her head. She is not sure if she passed out. It took her several hours to get up. Patient is now having severe pain in the back of her neck, her mid and lower back as well as the right side of her chest. Patient denies unilateral weakness, numbness or tingling sensations. No seizure activity.  Currently patient is very drowsy in ED. She denies fever, chills, running nose, ear pain, headaches, cough, chest pain, SOB, abdominal pain, diarrhea, constipation, dysuria, urgency, frequency, hematuria, skin rashes or leg swelling. No vision change or hearing loss.  In ED, patient was found to have possible L4 compression fracture by x-ray of lumbar spin. CT head and C-spine did not show acute intracranial  abnormalities, but showed mild frontal soft tissue swelling. X-ray of ribs and thoracic spine is negative for acute abnormalities. WBC 5.2, temperature normal, no tachycardia, sodium 131, renal function okay. Patient is admitted to inpatient for further evaluation and treatment.  Review of Systems:   Constitutional: drowsy,  No fever, no chills; Appetite normal; No weight loss, no weight gain HEENT: No blurry vision, no diplopia, no pharyngitis, no dysphagia  CV: No chest pain, no palpitations, no PND, no orthopnea, no edema. Resp: No SOB, no cough, no pleuritic pain. GI: No nausea, no vomiting, no diarrhea, no melena, no hematochezia, no constipation, no abdominal pain.  GU: No dysuria, no hematuria, no frequency, no urgency.  MSK: has back pain, neck pain.  Neuro: no focal neurological deficits, no history of seizures. Psych: No suicidal or homicidal idiation Endo: No heat intolerance, no cold intolerance, no polyuria, no polydipsia  Skin: has multiple bruises.  Heme: No lymphadenopathy Travel history:No recent long distant travel.   Where does patient live?  At home Can patient participate in ADLs? some  Allergy:  Allergies  Allergen Reactions  . Iodine Other (See Comments)    Sensitive when used gynecologically (intra-vaginally).  . Iron Itching    Iron taken by mouth causes this reaction. The patient has had several iron infusions plus Feraheme that has not caused any side effects.   . Lovaza [Omega-3-Acid Ethyl Esters] Itching  . Soma [Carisoprodol] Other (See Comments)    Pt states that this medication causes panic attacks.   . Sulfonamide Derivatives Other (See Comments)    Sensitive when used intra-vaginally.  . Benazepril Hcl Other (  See Comments)    Reaction:  Severe cough   . Codeine Other (See Comments)    Pt states that this medication causes rapid heartbeat.   Roselee Nova [Pregabalin]     Pt can not remember  . Penicillins Other (See Comments)    Reaction: Severe  family allergy.  Family physician long ago stated she shouldn't take this med since mother and brother nearly died from anaphylaxis.  Marland Kitchen Zolpidem Tartrate Itching and Other (See Comments)    Pt states that this medication causes blackouts.   . Amitriptyline Other (See Comments)    Pt states that this medication causes sleep walking.     Past Medical History  Diagnosis Date  . Osteoporosis   . Hypothyroidism   . Diabetes mellitus   . Exogenous obesity   . Rhinitis   . Arthritis   . Fibromyalgia   . Somnolence   . Sleep apnea   . Anemia, iron deficiency 04/06/2011  . Pernicious anemia 04/06/2011  . Depression   . Anxiety   . Iron deficiency anemia, unspecified 12/12/2012  . Iron deficiency anemia due to chronic blood loss 01/15/2014  . GERD (gastroesophageal reflux disease)   . GIB (gastrointestinal bleeding)   . H/O gastric bypass     Past Surgical History  Procedure Laterality Date  . Gastric bypass    . Cholecystectomy    . Tracheostomy    . Uvulectomy    . Tonsillectomy    . Mandible reconstruction    . Spinal fusion    . Dilation and curettage of uterus    . Cesarean section      Social History:  reports that she has never smoked. She has never used smokeless tobacco. She reports that she drinks alcohol. She reports that she does not use illicit drugs.  Family History:  Family History  Problem Relation Age of Onset  . CAD Father   . Diabetes Father   . CAD Brother      X3  . Hypertension Brother   . Colon cancer Brother      Prior to Admission medications   Medication Sig Start Date End Date Taking? Authorizing Provider  acarbose (PRECOSE) 25 MG tablet Take 25 mg by mouth daily as needed (for low blood sugar).  10/09/12   Historical Provider, MD  alprazolam Prudy Feeler) 2 MG tablet Take 1 tablets by mouth twice daily as needed for anxiety; Take one  tablets by mouth at bedtime as needed for sleep or anxiety 04/14/14   Nishant Dhungel, MD  Cholecalciferol (VITAMIN D-3)  5000 UNITS TABS Take 2 tablets by mouth daily.    Historical Provider, MD  Cyanocobalamin (VITAMIN B-12 IJ) Inject as directed every 28 (twenty-eight) days.    Historical Provider, MD  cyclobenzaprine (FLEXERIL) 10 MG tablet Take 1 tablet (10 mg total) by mouth 3 (three) times daily. 11/03/13   Renae Fickle, MD  diclofenac (VOLTAREN) 75 MG EC tablet Take 75 mg by mouth 2 (two) times daily.    Historical Provider, MD  docusate sodium 100 MG CAPS Take 100 mg by mouth 2 (two) times daily. Patient taking differently: Take 100 mg by mouth 3 (three) times a week.  11/03/13   Renae Fickle, MD  furosemide (LASIX) 20 MG tablet Take 20 mg by mouth daily at 12 noon. 03/24/14   Historical Provider, MD  levothyroxine (SYNTHROID, LEVOTHROID) 112 MCG tablet Take 112-224 mcg by mouth daily before breakfast. Pt takes every day, but on Monday she takes  112 mcg.    Historical Provider, MD  loratadine (CLARITIN) 10 MG tablet Take 1 tablet (10 mg total) by mouth 2 (two) times daily. Patient taking differently: Take 10 mg by mouth daily as needed for allergies.  07/13/12   Thermon LeylandLaura A Davis, NP  Magnesium 250 MG TABS Take 1 tablet by mouth daily.    Historical Provider, MD  modafinil (PROVIGIL) 200 MG tablet Take 2 tablets (400 mg total) by mouth every morning. 11/05/13   Mahima Glade LloydPandey, MD  mometasone (NASONEX) 50 MCG/ACT nasal spray Place 2 sprays into the nose daily.    Historical Provider, MD  omeprazole (PRILOSEC) 40 MG capsule Take 1 capsule (40 mg total) by mouth daily. 07/13/12   Thermon LeylandLaura A Davis, NP  oxyCODONE-acetaminophen (PERCOCET) 10-325 MG per tablet Take 1 tablet by mouth every 6 (six) hours as needed for pain. 04/14/14   Nishant Dhungel, MD  oxymorphone (OPANA ER) 30 MG 12 hr tablet Take 30 mg by mouth every 12 (twelve) hours.    Historical Provider, MD  PARoxetine (PAXIL) 20 MG tablet Take 20 mg by mouth 2 (two) times daily. 03/24/14   Historical Provider, MD  promethazine (PHENERGAN) 25 MG tablet Take 25  mg by mouth every 8 (eight) hours as needed for nausea.     Historical Provider, MD  SUMAtriptan (IMITREX) 100 MG tablet Take 100 mg by mouth every 2 (two) hours as needed for migraine.  05/01/12   Historical Provider, MD  topiramate (TOPAMAX) 200 MG tablet Take 1 tablet (200 mg total) by mouth at bedtime. 07/13/12   Thermon LeylandLaura A Davis, NP  valACYclovir (VALTREX) 1000 MG tablet Take 1,000 mg by mouth daily as needed (for flare ups).  08/16/11   Historical Provider, MD    Physical Exam: Filed Vitals:   05/13/14 1532 05/13/14 1932 05/13/14 2019  BP: 120/76 104/66 116/66  Pulse: 78 76   Temp: 98.9 F (37.2 C)  97.4 F (36.3 C)  TempSrc: Oral  Oral  Resp: 16 14   Height:   5\' 8"  (1.727 m)  Weight:   94.711 kg (208 lb 12.8 oz)  SpO2: 98% 90%    General: Not in acute distress HEENT: Bruising left forehead (patient states this is from a prior fall)        Eyes: PERRL, EOMI, no scleral icterus       ENT: No discharge from the ears and nose, no pharynx injection, no tonsillar enlargement.        Neck: No JVD, no bruit, no mass felt. Heme: No neck or axillary lymph node enlargement Cardiac: S1/S2, RRR, No murmurs, No gallops or rubs Pulm: Good air movement bilaterally. Clear to auscultation bilaterally. No rales, wheezing, rhonchi or rubs. Abd: Soft, nondistended, nontender, no rebound pain, no organomegaly, BS present Ext: No edema bilaterally. 2+DP/PT pulse bilaterally Musculoskeletal: there is tenderness over midline of c spin, thoracic and lumbar spin. No limitation of movement. Skin: No rashes. Bruises over right knee Neuro: drowsy, oriented to person and place, not to time, cranial nerves II-XII grossly intact, muscle strength 4/5 in all extremities (symmetric), sensation to light touch intact. Brachial reflex 2+ bilaterally. Knee reflex 1+ bilaterally. Negative Babinski's sign. Slow finger to nose test. Psych: Patient is not psychotic, no suicidal or hemocidal ideation.  Labs on Admission:   Basic Metabolic Panel:  Recent Labs Lab 05/13/14 1608  NA 131*  K 4.2  CL 102  CO2 23  GLUCOSE 68*  BUN 17  CREATININE 0.67  CALCIUM  8.5*   Liver Function Tests: No results for input(s): AST, ALT, ALKPHOS, BILITOT, PROT, ALBUMIN in the last 168 hours. No results for input(s): LIPASE, AMYLASE in the last 168 hours. No results for input(s): AMMONIA in the last 168 hours. CBC:  Recent Labs Lab 05/13/14 1608  WBC 5.2  NEUTROABS 3.8  HGB 10.0*  HCT 31.1*  MCV 95.7  PLT 198   Cardiac Enzymes: No results for input(s): CKTOTAL, CKMB, CKMBINDEX, TROPONINI in the last 168 hours.  BNP (last 3 results)  Recent Labs  03/11/14 0146  BNP 58.6    ProBNP (last 3 results) No results for input(s): PROBNP in the last 8760 hours.  CBG: No results for input(s): GLUCAP in the last 168 hours.  Radiological Exams on Admission: Dg Ribs Unilateral W/chest Right  05/13/2014   CLINICAL DATA:  Fall at home today. Right-sided rib pain. Initial encounter.  EXAM: RIGHT RIBS AND CHEST - 3+ VIEW  COMPARISON:  One-view chest 04/11/2014  FINDINGS: The heart size is normal. The lungs are clear. Rightward curvature of the thoracic spine is again noted. Cervical spine fusion is present.  Dedicated imaging of the right ribs demonstrates no acute or healing fracture. There is no pneumothorax.  IMPRESSION: No acute fracture.   Electronically Signed   By: Marin Roberts M.D.   On: 05/13/2014 17:04   Dg Thoracic Spine 2 View  05/13/2014   CLINICAL DATA:  Fall at home today. Severe low back pain and right leg pain. Right anterior rib pain.  EXAM: THORACIC SPINE - 2 VIEW  COMPARISON:  Two-view chest x-ray 03/10/2014  FINDINGS: Twelve rib-bearing thoracic type vertebral bodies are present. Rightward curvature of the lower thoracic spine is stable. Endplate degenerative changes are noted throughout the thoracic spine. Vertebral body heights are stable. Cervical spine fusion is noted.  IMPRESSION: 1.  Multilevel degenerative changes throughout the thoracic spine without evidence for acute fracture or traumatic subluxation.   Electronically Signed   By: Marin Roberts M.D.   On: 05/13/2014 17:02   Dg Lumbar Spine Complete  05/13/2014   CLINICAL DATA:  Larey Seat at home earlier this morning, severe low back pain, RIGHT leg pain, RIGHT anterior side mid rib cage pain, history osteoporosis  EXAM: LUMBAR SPINE - COMPLETE 4+ VIEW  COMPARISON:  05/03/2013  FINDINGS: Five non-rib-bearing lumbar vertebrae.  Diffuse osseous demineralization.  Prior spinal augmentation procedures at L1 and L2 with mild residual anterior height loss greater at L1.  Slight disc space narrowing at L2-L3 and L3-L4.  New slight anterior height loss of the L4 vertebral body consistent with a subtle compression fracture, better appreciated on coned-down view.  No subluxation, bone destruction or spondylolysis.  SI joints symmetric.  Scattered surgical clips and pelvic phleboliths.  IMPRESSION: Osseous demineralization with prior spinal augmentation procedures at fractures of L1 and L2.  New subtle compression fracture L4 vertebral body.   Electronically Signed   By: Ulyses Southward M.D.   On: 05/13/2014 17:04   Ct Head Wo Contrast  05/13/2014   CLINICAL DATA:  Patient fell over pet, hitting head, yesterday. Head pain. Neck pain.  EXAM: CT HEAD WITHOUT CONTRAST  CT CERVICAL SPINE WITHOUT CONTRAST  TECHNIQUE: Multidetector CT imaging of the head and cervical spine was performed following the standard protocol without intravenous contrast. Multiplanar CT image reconstructions of the cervical spine were also generated.  COMPARISON:  None.  FINDINGS: CT HEAD FINDINGS  No evidence for acute infarction, hemorrhage, mass lesion, hydrocephalus, or extra-axial fluid.  Mild atrophy. Hypoattenuation of white matter suggesting chronic microvascular ischemic change. Calvarium intact. Mild frontal soft tissue swelling. No sinus or mastoid air fluid level.  Negative appearing orbits.  CT CERVICAL SPINE FINDINGS  There is no visible cervical spine fracture, traumatic subluxation, prevertebral soft tissue swelling, or intraspinal hematoma. Previous cervical fusion with anterior plating from C4 through C7. 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 Minor disc pathology at C7-T1 and T1-2. Lung apices clear. No neck masses. No significant atherosclerosis. Airway midline.  IMPRESSION: Mild frontal soft tissue swelling. No skull fracture or intracranial hemorrhage.  Status post C4-C7 ACDF. 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 No acute cervical spine pathology.   Electronically Signed   By: Davonna Belling M.D.   On: 05/13/2014 16:47   Ct Cervical Spine Wo Contrast  05/13/2014   CLINICAL DATA:  Patient fell over pet, hitting head, yesterday. Head pain. Neck pain.  EXAM: CT HEAD WITHOUT CONTRAST  CT CERVICAL SPINE WITHOUT CONTRAST  TECHNIQUE: Multidetector CT imaging of the head and cervical spine was performed following the standard protocol without intravenous contrast. Multiplanar CT image reconstructions of the cervical spine were also generated.  COMPARISON:  None.  FINDINGS: CT HEAD FINDINGS  No evidence for acute infarction, hemorrhage, mass lesion, hydrocephalus, or extra-axial fluid. Mild atrophy. Hypoattenuation of white matter suggesting chronic microvascular ischemic change. Calvarium intact. Mild frontal soft tissue swelling. No sinus or mastoid air fluid level. Negative appearing orbits.  CT CERVICAL SPINE FINDINGS  There is no visible cervical spine fracture, traumatic subluxation, prevertebral soft tissue swelling, or intraspinal hematoma. Previous cervical fusion with anterior plating from C4 through C7. 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 Minor disc pathology at C7-T1 and T1-2. Lung apices clear. No neck masses. No significant atherosclerosis. Airway midline.  IMPRESSION: Mild frontal soft tissue swelling. No skull fracture or intracranial hemorrhage.  Status post C4-C7 ACDF. 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 No  acute cervical spine pathology.   Electronically Signed   By: Davonna Belling M.D.   On: 05/13/2014 16:47    EKG: not done in ED, will get one.   Assessment/Plan Principal Problem:   Lumbar compression fracture Active Problems:   Hypothyroidism   OSA (obstructive sleep apnea)   Pernicious anemia   Bipolar I disorder   Iron deficiency anemia due to chronic blood loss   Chronic hyponatremia   Chronic narcotic use   Fall   Diabetes mellitus without complication   GERD (gastroesophageal reflux disease)   Back pain  Lumbar compression fracture: Patient has chronic back pain, now presents with possible lumbar compression fracture after fall. Patient is very drowsy after received IV Dilaudid in the emergency room. Need to limit her narcotic use. -will admit to tele bed -Decrease Percocet from 10/325 mg every 6 hours to 5/325 every 8 hours -D/C Phenergan -Decrease Opana from 30 mg to 15 mg twice a day -Continue diclofenac -Start Lidoderm for local pain control  Fall: Etiology is not clear, but likely due to narcotic and Xanax use per her son. Orthostatic status is also possible given hat patient is taking Lasix with unclear reason. -limit Narcotic use as above -Decrease the Xanax to 0.25 mg bid -check orthostatic sign -D/C Lasix -Check UDS, alcohol level, HIV -IV fluid: Normal sitting 100 mL per hour  Hypothyroidism: TSH was 0.661 on 03/11/14. -continue Synthroid  Anemia: Including iron deficiency and pernicious anemia. Patient had history of gastric bypass and history of GI bleeding. She has been followed up by hematologist, Dr. [name]. Last seen was 04/09/14 by NP, who gave patient Vb12  injection. Hgb 9.9on 04/12/14-->10.9 today. -follow up with Dr. [name]  Chronic hyponatremia:  Na=131. Recently admitted the hospital for hyponatremia and hypotension felt to be related to excessive water ingestion. Patient is on Lasix which may have also contributed. -Hold her Lasix -IV fluid: Normal  sitting 100 mL per hour  Diabetes mellitus: A1c was 5.0 on 10/04/13. Patient is Acarbose at home. -Sliding-scale insulin  Depression and Anxiety: No suicidal or homicidal ideations. Patient is taking when necessary Xanax at home, which may have contributed to her fall. -Decreased dose of Xanax to 0.25 mg twice a day when necessary -Continue Paxil  GERD: -Protonix  Possible hypertension: Patient does not have hypertension on her problem list. She cannot tell whether she has hypertension. Patient is on Lasix with unclear etiology. She seems not to have congestive heart failure (EF of 60-65% by 2-D echo on 03/10/04). -DC Lasix due to hyponatremia and possible orthostatic sign which may have contributed to her fall -When necessary hydralazine  DVT ppx: SQ Heparin     Code Status: Full code Family Communication: Yes, patient's   daughter     at bed side Disposition Plan: Admit to inpatient   Date of Service 05/13/2014    Lorretta Harp [facility] Pager [phone]  If 7PM-7AM, please contact night-coverage [email or web address] Password [email or web address] 05/13/2014, 9:01 PM

## 2014-05-13 NOTE — Discharge Instructions (Signed)
Chest Wall Pain Chest wall pain is pain in or around the bones and muscles of your chest. It may take up to 6 weeks to get better. It may take longer if you must stay physically active in your work and activities.  CAUSES  Chest wall pain may happen on its own. However, it may be caused by:  A viral illness like the flu.  Injury.  Coughing.  Exercise.  Arthritis.  Fibromyalgia.  Shingles. HOME CARE INSTRUCTIONS   Avoid overtiring physical activity. Try not to strain or perform activities that cause pain. This includes any activities using your chest or your abdominal and side muscles, especially if heavy weights are used.  Put ice on the sore area.  Put ice in a plastic bag.  Place a towel between your skin and the bag.  Leave the ice on for 15-20 minutes per hour while awake for the first 2 days.  Only take over-the-counter or prescription medicines for pain, discomfort, or fever as directed by your caregiver. SEEK IMMEDIATE MEDICAL CARE IF:   Your pain increases, or you are very uncomfortable.  You have a fever.  Your chest pain becomes worse.  You have new, unexplained symptoms.  You have nausea or vomiting.  You feel sweaty or lightheaded.  You have a cough with phlegm (sputum), or you cough up blood. MAKE SURE YOU:   Understand these instructions.  Will watch your condition.  Will get help right away if you are not doing well or get worse. Document Released: 12/27/2004 Document Revised: 03/21/2011 Document Reviewed: 08/23/2010 [facility] Patient Information 2015 [place], [place]. This information is not intended to replace advice given to you by your health care provider. Make sure you discuss any questions you have with your health care provider. Back Pain, Adult Low back pain is very common. About 1 in 5 people have back pain.The cause of low back pain is rarely dangerous. The pain often gets better over time.About half of people with a sudden onset of  back pain feel better in just 2 weeks. About 8 in 10 people feel better by 6 weeks.  CAUSES Some common causes of back pain include:  Strain of the muscles or ligaments supporting the spine.  Wear and tear (degeneration) of the spinal discs.  Arthritis.  Direct injury to the back. DIAGNOSIS Most of the time, the direct cause of low back pain is not known.However, back pain can be treated effectively even when the exact cause of the pain is unknown.Answering your caregiver's questions about your overall health and symptoms is one of the most accurate ways to make sure the cause of your pain is not dangerous. If your caregiver needs more information, he or she may order lab work or imaging tests (X-rays or MRIs).However, even if imaging tests show changes in your back, this usually does not require surgery. HOME CARE INSTRUCTIONS For many people, back pain returns.Since low back pain is rarely dangerous, it is often a condition that people can learn to [facility] LLCmanageon their own.   Remain active. It is stressful on the back to sit or stand in one place. Do not sit, drive, or stand in one place for more than 30 minutes at a time. Take short walks on level surfaces as soon as pain allows.Try to increase the length of time you walk each day.  Do not stay in bed.Resting more than 1 or 2 days can delay your recovery.  Do not avoid exercise or work.Your body is made  to move.It is not dangerous to be active, even though your back may hurt.Your back will likely heal faster if you return to being active before your pain is gone.  Pay attention to your body when you bend and lift. Many people have less discomfortwhen lifting if they bend their knees, keep the load close to their bodies,and avoid twisting. Often, the most comfortable positions are those that put less stress on your recovering back.  Find a comfortable position to sleep. Use a firm mattress and lie on your side with your knees  slightly bent. If you lie on your back, put a pillow under your knees.  Only take over-the-counter or prescription medicines as directed by your caregiver. Over-the-counter medicines to reduce pain and inflammation are often the most helpful.Your caregiver may prescribe muscle relaxant drugs.These medicines help dull your pain so you can more quickly return to your normal activities and healthy exercise.  Put ice on the injured area.  Put ice in a plastic bag.  Place a towel between your skin and the bag.  Leave the ice on for 15-20 minutes, 03-04 times a day for the first 2 to 3 days. After that, ice and heat may be alternated to reduce pain and spasms.  Ask your caregiver about trying back exercises and gentle massage. This may be of some benefit.  Avoid feeling anxious or stressed.Stress increases muscle tension and can worsen back pain.It is important to recognize when you are anxious or stressed and learn ways to manage it.Exercise is a great option. SEEK MEDICAL CARE IF:  You have pain that is not relieved with rest or medicine.  You have pain that does not improve in 1 week.  You have new symptoms.  You are generally not feeling well. SEEK IMMEDIATE MEDICAL CARE IF:   You have pain that radiates from your back into your legs.  You develop new bowel or bladder control problems.  You have unusual weakness or numbness in your arms or legs.  You develop nausea or vomiting.  You develop abdominal pain.  You feel faint. Document Released: 12/27/2004 Document Revised: 06/28/2011 Document Reviewed: 04/30/2013 [facility] TownshipExitCare Patient Information 2015 [place], [place]. This information is not intended to replace advice given to you by your health care provider. Make sure you discuss any questions you have with your health care provider.

## 2014-05-13 NOTE — ED Provider Notes (Addendum)
CSN: [id number]     Arrival date & time 05/13/14  1520 History   First MD Initiated Contact with Patient 05/13/14 1539     Chief Complaint  Patient presents with  . Back Pain    HPI Pt has history of chronic narcotic use, back pain.  Recently admitted the hospital for hyponatremia and hypotension felt to be related to excessive water ingestion.  Patient states she fell twice since last evening. The first time she got up from her bed and stumbled. Patient had taken her sleeping medications. Patient fell forward hitting the right side of her chest. Later in the evening the patient walked downstairs and was looking out the back door when her dog jumped up on her. The patient then fell backwards striking her head. She is not sure if she got knocked out. It took her several hours to get up. Patient is now having pain in the back of her neck, her mid and lower back as well as the right side of her chest. She denies any shortness of breath. No nausea or vomiting. She does have chronic back pain issues. She did not take her medications this morning. She denies any fevers or chills. She denies vomiting diarrhea or other complaints. Past Medical History  Diagnosis Date  . Osteoporosis   . Hypothyroidism   . Diabetes mellitus   . Exogenous obesity   . Rhinitis   . Arthritis   . Fibromyalgia   . Somnolence   . Sleep apnea   . Anemia, iron deficiency 04/06/2011  . Pernicious anemia 04/06/2011  . Depression   . Anxiety   . Iron deficiency anemia, unspecified 12/12/2012  . Iron deficiency anemia due to chronic blood loss 01/15/2014   Past Surgical History  Procedure Laterality Date  . Gastric bypass    . Cholecystectomy    . Tracheostomy    . Uvulectomy    . Tonsillectomy    . Mandible reconstruction    . Spinal fusion    . Dilation and curettage of uterus    . Cesarean section     Family History  Problem Relation Age of Onset  . CAD Father   . Diabetes Father   . CAD Brother      X3  .  Hypertension Brother   . Colon cancer Brother    History  Substance Use Topics  . Smoking status: Never Smoker   . Smokeless tobacco: Never Used     Comment: never used tobacco  . Alcohol Use: 0.0 oz/week    0 Standard drinks or equivalent per week     Comment: one mixed drink twice yearly   OB History    No data available     Review of Systems  All other systems reviewed and are negative.     Allergies  Iodine; Iron; Lovaza; Soma; Sulfonamide derivatives; Benazepril hcl; Codeine; Lyrica; Penicillins; Zolpidem tartrate; and Amitriptyline  Home Medications   Prior to Admission medications   Medication Sig Start Date End Date Taking? Authorizing Provider  acarbose (PRECOSE) 25 MG tablet Take 25 mg by mouth daily as needed (for low blood sugar).  10/09/12   Historical Provider, MD  alprazolam Prudy Feeler) 2 MG tablet Take 1 tablets by mouth twice daily as needed for anxiety; Take one  tablets by mouth at bedtime as needed for sleep or anxiety 04/14/14   Nishant Dhungel, MD  Cholecalciferol (VITAMIN D-3) 5000 UNITS TABS Take 2 tablets by mouth daily.    Historical Provider,  MD  Cyanocobalamin (VITAMIN B-12 IJ) Inject as directed every 28 (twenty-eight) days.    Historical Provider, MD  cyclobenzaprine (FLEXERIL) 10 MG tablet Take 1 tablet (10 mg total) by mouth 3 (three) times daily. 11/03/13   Renae Fickle, MD  diclofenac (VOLTAREN) 75 MG EC tablet Take 75 mg by mouth 2 (two) times daily.    Historical Provider, MD  docusate sodium 100 MG CAPS Take 100 mg by mouth 2 (two) times daily. Patient taking differently: Take 100 mg by mouth 3 (three) times a week.  11/03/13   Renae Fickle, MD  furosemide (LASIX) 20 MG tablet Take 20 mg by mouth daily at 12 noon. 03/24/14   Historical Provider, MD  levothyroxine (SYNTHROID, LEVOTHROID) 112 MCG tablet Take 112-224 mcg by mouth daily before breakfast. Pt takes every day, but on Monday she takes 112 mcg.    Historical Provider, MD   loratadine (CLARITIN) 10 MG tablet Take 1 tablet (10 mg total) by mouth 2 (two) times daily. Patient taking differently: Take 10 mg by mouth daily as needed for allergies.  07/13/12   Thermon Leyland, NP  Magnesium 250 MG TABS Take 1 tablet by mouth daily.    Historical Provider, MD  modafinil (PROVIGIL) 200 MG tablet Take 2 tablets (400 mg total) by mouth every morning. 11/05/13   Mahima Glade Lloyd, MD  mometasone (NASONEX) 50 MCG/ACT nasal spray Place 2 sprays into the nose daily.    Historical Provider, MD  omeprazole (PRILOSEC) 40 MG capsule Take 1 capsule (40 mg total) by mouth daily. 07/13/12   Thermon Leyland, NP  oxyCODONE-acetaminophen (PERCOCET) 10-325 MG per tablet Take 1 tablet by mouth every 6 (six) hours as needed for pain. 04/14/14   Nishant Dhungel, MD  oxymorphone (OPANA ER) 30 MG 12 hr tablet Take 30 mg by mouth every 12 (twelve) hours.    Historical Provider, MD  PARoxetine (PAXIL) 20 MG tablet Take 20 mg by mouth 2 (two) times daily. 03/24/14   Historical Provider, MD  promethazine (PHENERGAN) 25 MG tablet Take 25 mg by mouth every 8 (eight) hours as needed for nausea.     Historical Provider, MD  SUMAtriptan (IMITREX) 100 MG tablet Take 100 mg by mouth every 2 (two) hours as needed for migraine.  05/01/12   Historical Provider, MD  topiramate (TOPAMAX) 200 MG tablet Take 1 tablet (200 mg total) by mouth at bedtime. 07/13/12   Thermon Leyland, NP  valACYclovir (VALTREX) 1000 MG tablet Take 1,000 mg by mouth daily as needed (for flare ups).  08/16/11   Historical Provider, MD   BP 120/76 mmHg  Pulse 78  Temp(Src) 98.9 F (37.2 C) (Oral)  Resp 16  SpO2 98% Physical Exam  Constitutional: She appears well-developed and well-nourished. No distress.  HENT:  Head: Normocephalic and atraumatic.  Right Ear: External ear normal.  Left Ear: External ear normal.  Bruising left forehead(patient states this is from a prior fall)  Eyes: Conjunctivae are normal. Right eye exhibits no discharge. Left eye  exhibits no discharge. No scleral icterus.  Neck: Neck supple. No tracheal deviation present.  Cardiovascular: Normal rate, regular rhythm and intact distal pulses.   Pulmonary/Chest: Effort normal and breath sounds normal. No stridor. No respiratory distress. She has no wheezes. She has no rales.  Abdominal: Soft. Bowel sounds are normal. She exhibits no distension. There is tenderness. There is no rebound and no guarding.  The patient had her shirt on but there was no ecchymosis on  the anterior superior part of her chest wall  Musculoskeletal: She exhibits no edema.       Right hip: Normal.       Left hip: Normal.       Right knee: She exhibits ecchymosis.       Cervical back: She exhibits tenderness.       Thoracic back: She exhibits tenderness and bony tenderness.       Lumbar back: She exhibits tenderness and bony tenderness.  Patient does have old ecchymoses on her right knee that she states was from a prior fall  Neurological: She is alert. She has normal strength. No cranial nerve deficit (no facial droop, extraocular movements intact, no slurred speech) or sensory deficit. She exhibits normal muscle tone. She displays no seizure activity. Coordination normal.  Skin: Skin is warm and dry. No rash noted.  Psychiatric: She has a normal mood and affect.  Nursing note and vitals reviewed.   ED Course  Procedures (including critical care time) Labs Review Labs Reviewed  CBC WITH DIFFERENTIAL/PLATELET - Abnormal; Notable for the following:    RBC 3.25 (*)    Hemoglobin 10.0 (*)    HCT 31.1 (*)    All other components within normal limits  BASIC METABOLIC PANEL - Abnormal; Notable for the following:    Sodium 131 (*)    Glucose, Bld 68 (*)    Calcium 8.5 (*)    All other components within normal limits    Imaging Review Dg Ribs Unilateral W/chest Right  05/13/2014   CLINICAL DATA:  Fall at home today. Right-sided rib pain. Initial encounter.  EXAM: RIGHT RIBS AND CHEST - 3+ VIEW   COMPARISON:  One-view chest 04/11/2014  FINDINGS: The heart size is normal. The lungs are clear. Rightward curvature of the thoracic spine is again noted. Cervical spine fusion is present.  Dedicated imaging of the right ribs demonstrates no acute or healing fracture. There is no pneumothorax.  IMPRESSION: No acute fracture.   Electronically Signed   By: Marin Roberts M.D.   On: 05/13/2014 17:04   Dg Thoracic Spine 2 View  05/13/2014   CLINICAL DATA:  Fall at home today. Severe low back pain and right leg pain. Right anterior rib pain.  EXAM: THORACIC SPINE - 2 VIEW  COMPARISON:  Two-view chest x-ray 03/10/2014  FINDINGS: Twelve rib-bearing thoracic type vertebral bodies are present. Rightward curvature of the lower thoracic spine is stable. Endplate degenerative changes are noted throughout the thoracic spine. Vertebral body heights are stable. Cervical spine fusion is noted.  IMPRESSION: 1. Multilevel degenerative changes throughout the thoracic spine without evidence for acute fracture or traumatic subluxation.   Electronically Signed   By: Marin Roberts M.D.   On: 05/13/2014 17:02   Dg Lumbar Spine Complete  05/13/2014   CLINICAL DATA:  Larey Seat at home earlier this morning, severe low back pain, RIGHT leg pain, RIGHT anterior side mid rib cage pain, history osteoporosis  EXAM: LUMBAR SPINE - COMPLETE 4+ VIEW  COMPARISON:  05/03/2013  FINDINGS: Five non-rib-bearing lumbar vertebrae.  Diffuse osseous demineralization.  Prior spinal augmentation procedures at L1 and L2 with mild residual anterior height loss greater at L1.  Slight disc space narrowing at L2-L3 and L3-L4.  New slight anterior height loss of the L4 vertebral body consistent with a subtle compression fracture, better appreciated on coned-down view.  No subluxation, bone destruction or spondylolysis.  SI joints symmetric.  Scattered surgical clips and pelvic phleboliths.  IMPRESSION: Osseous demineralization  with prior spinal  augmentation procedures at fractures of L1 and L2.  New subtle compression fracture L4 vertebral body.   Electronically Signed   By: Ulyses SouthwardMark  Boles M.D.   On: 05/13/2014 17:04   Ct Head Wo Contrast  05/13/2014   CLINICAL DATA:  Patient fell over pet, hitting head, yesterday. Head pain. Neck pain.  EXAM: CT HEAD WITHOUT CONTRAST  CT CERVICAL SPINE WITHOUT CONTRAST  TECHNIQUE: Multidetector CT imaging of the head and cervical spine was performed following the standard protocol without intravenous contrast. Multiplanar CT image reconstructions of the cervical spine were also generated.  COMPARISON:  None.  FINDINGS: CT HEAD FINDINGS  No evidence for acute infarction, hemorrhage, mass lesion, hydrocephalus, or extra-axial fluid. Mild atrophy. Hypoattenuation of white matter suggesting chronic microvascular ischemic change. Calvarium intact. Mild frontal soft tissue swelling. No sinus or mastoid air fluid level. Negative appearing orbits.  CT CERVICAL SPINE FINDINGS  There is no visible cervical spine fracture, traumatic subluxation, prevertebral soft tissue swelling, or intraspinal hematoma. Previous cervical fusion with anterior plating from C4 through C7. 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 Minor disc pathology at C7-T1 and T1-2. Lung apices clear. No neck masses. No significant atherosclerosis. Airway midline.  IMPRESSION: Mild frontal soft tissue swelling. No skull fracture or intracranial hemorrhage.  Status post C4-C7 ACDF. 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 No acute cervical spine pathology.   Electronically Signed   By: Davonna BellingJohn  Curnes M.D.   On: 05/13/2014 16:47   Ct Cervical Spine Wo Contrast  05/13/2014   CLINICAL DATA:  Patient fell over pet, hitting head, yesterday. Head pain. Neck pain.  EXAM: CT HEAD WITHOUT CONTRAST  CT CERVICAL SPINE WITHOUT CONTRAST  TECHNIQUE: Multidetector CT imaging of the head and cervical spine was performed following the standard protocol without intravenous contrast. Multiplanar CT image reconstructions  of the cervical spine were also generated.  COMPARISON:  None.  FINDINGS: CT HEAD FINDINGS  No evidence for acute infarction, hemorrhage, mass lesion, hydrocephalus, or extra-axial fluid. Mild atrophy. Hypoattenuation of white matter suggesting chronic microvascular ischemic change. Calvarium intact. Mild frontal soft tissue swelling. No sinus or mastoid air fluid level. Negative appearing orbits.  CT CERVICAL SPINE FINDINGS  There is no visible cervical spine fracture, traumatic subluxation, prevertebral soft tissue swelling, or intraspinal hematoma. Previous cervical fusion with anterior plating from C4 through C7. 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 Minor disc pathology at C7-T1 and T1-2. Lung apices clear. No neck masses. No significant atherosclerosis. Airway midline.  IMPRESSION: Mild frontal soft tissue swelling. No skull fracture or intracranial hemorrhage.  Status post C4-C7 ACDF. 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 No acute cervical spine pathology.   Electronically Signed   By: Davonna BellingJohn  Curnes M.D.   On: 05/13/2014 16:47   Medications  HYDROmorphone (DILAUDID) injection 1 mg (1 mg Intramuscular Given 05/13/14 1700)     MDM   Final diagnoses:  Chest wall pain  Fall    Xrays reviewed.  Possible l4 compression fx.  Pt has history of multiple falls in the past.  Chronic pain on long standing opiates.  Pt appears comfortable in the ED.   Does have difficulty with mobility. Labs are stable.  Will ask for social work, home health assessment;  Linwood DibblesJon Analya Louissaint, MD 05/13/14 1752  Pt does not feel she will be safe at home.  Will try to see if she can ambulate.  Offered home health assistance but will make sure she can walk with assistance.  Linwood DibblesJon Trigg Delarocha, MD 05/13/14 1820  Pt tried to stand up but could not  bear weight because of the lower back pain.  I will consult with the medical service for pain management, overnight observation.  She may require nursing care placement for rehab.  Linwood Dibbles, MD 05/13/14 1851  Pt now  appears very somnolent after her last dose of narcotic.  Family was recently here.  I asked her if they gave her any of her home medications.  I was trying to ask her who she was seeing for her back problems but she cant give me an answer right now.  Linwood Dibbles, MD 05/13/14 [phone]

## 2014-05-13 NOTE — ED Notes (Signed)
Per EMS pt from home with c/o chronic neck and back pain, sts fell last night which made pain worse. Pt also sts she is unable to sit.

## 2014-05-13 NOTE — ED Notes (Signed)
Bed: [facility] Expected date:  Expected time:  Means of arrival:  Comments: EMS back pain 

## 2014-05-13 NOTE — ED Notes (Addendum)
Since family came, pt now seems more lethargic than before. Dr. Lynelle DoctorKnapp at bedside.

## 2014-05-13 NOTE — ED Notes (Signed)
Case manager at the bedside.  

## 2014-05-13 NOTE — Clinical Social Work Note (Signed)
Clinical Social Work Assessment  Patient Details  Name: Valerie HongLaura J Galanti MRN: [id number] Date of Birth: 06-19-1960  Date of referral:  05/13/14               Reason for consult:   (Possible SNF Placement.)                Permission sought to share information with:   (None.) Permission granted to share information::  No  Name::        Agency::     Relationship::     Contact Information:     Housing/Transportation Living arrangements for the past 2 months:  Apartment Source of Information:  Adult Children (Son/ Ronaldo MiyamotoKyle [phone]) Patient Interpreter Needed:  None Criminal Activity/Legal Involvement Pertinent to Current Situation/Hospitalization:  No - Comment as needed Significant Relationships:  Adult Children Lives with:  Self (Son informed CWS that he usually lives with pt .but is not there at this time due to handling other reponsisiblies. He states that he has been cleaning out an apartment. ) Do you feel safe going back to the place where you live?   (Unable to assess. Patient was asleep.) Need for family participation in patient care:  Yes (Comment) (Son states that he is the pt's primary support. He states that he does not belive the pt would be interested in facility.)  Care giving concerns:  Per son, he is the pt's primary support. He states that he usually lives with the pt but is not there at this time due to him having to clean an apartment within a certain amount of time.   Social Worker assessment / plan:  CSW attempted to speak with pt at bedside. However, the pt feel asleep. Son was present. Admission Nurse was present.   [facility] Son confirms that the pt comes from home. He states that he usually lives with the pt but has not been with her recently due to personal responsibilities mentioned above. Son states that he checks on the pt daily. Son states that the pt completes her ADL's independently. However, he states that he  believes the pt needs assistance.  Son states that the pt falls often. He says that he believes the pt has fallen about x15 in a 6 month time frame. He informed CSW that the pt usually walks without assistance, but at a slow pace.  Son states that he does not believe the pt would like to go to a facility.  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 However, son states that he does not believe the pt would like to go to a facility.) Information / Referral to community resources:     Patient/Family's Response to care:  Son is aware that pt is being admitted. He confirms that his primary concern is the pt's well being at this time.   Patient/Family's Understanding of and Emotional Response to Diagnosis, Current Treatment, and Prognosis:  Patient was asleep during the interview. Son is aware that the pt is being admitted, [facility]  Emotional Assessment Appearance:  Appears older than stated age Attitude/Demeanor/Rapport:   (Patient was asleep during the interview.) Affect (typically observed):  Unable to Assess Orientation:   (Unable to assess.) Alcohol /  Substance use:  Never Used (Son states that the pt never used alcohol or substances.) Psych involvement (Current and /or in the community):  No (Comment)  Discharge Needs  Concerns to be addressed:  Adjustment to Illness Readmission within the last 30 days:  Yes Current discharge risk:  Lack of support system Barriers to Discharge:  No Barriers Identified   Alene Mires, LCSW 05/13/2014, 11:02 PM

## 2014-05-13 NOTE — Progress Notes (Signed)
ED CM consulted by ED RN prior to pt d/c to see if pt needed resources at home CM spoke with pt who states she lives alone.  Female at bedside.  Later in interaction with pt another female and female visited who pt states lives near her Female stated that First female at pt bedside could stay with pt if needed "tonight" CM reviewed with pt that resources for DME and home health services are available if she chooses.  CM reviewed in details medicare guidelines, home health [facility](HH) (length of stay in home, [facility] available, coverage, primary caregiver, up to 24 hrs before services may be started),CM provided pt with a list of [place] home health agencies. Cm discussed HHRN & PT to assist with evaluation and treatment at home plus able to speak with her pcp, Dr Cliffton AstersWhite, and/or orthopedic provider about how she is progressing with the compression fracture.  Cm discussed with pt that standardly compression fractures heal with rest. Pt confirmed with CM she has a w/c and cane at home only. Pt not willing to make decision about home health She prefers to speak to EDP again to determine if she has a fracture.  States she was informed she may or may not have a compression fracture.  Pt states it feels like she make have a compression fracture like she previous had years ago that the orthopedic dr placed "bone glue" or " some type of substance in" and "I was able to sit up and move around."  Pt states she comes from [place] with a hx of a back injury managed by worker compensation in which she [place] pain management [place] in [place].  She reports that she stopped going to this assigned pain management clinic because of the travel distance and since then her orthopedic dr has been managing her pain.  Pt reports her recent fall is not related to work comp.  Reports falling at home. When CM asked pt her anticipated d/c plan she stated she would prefer to stay in the hospital sand treated until she could definitive  be told she had a compression fracture.   ED CM discussed this with EDP, [place] plus pt request to definitively know if she has a compression fracture.  Cm re visited pt after EDP spoke with her and she stated EDP wanted to see if she could ambulate and to have another MD see her for observation  [facility] ED PM CM updated

## 2014-05-14 DIAGNOSIS — F1190 Opioid use, unspecified, uncomplicated: Secondary | ICD-10-CM | POA: Diagnosis not present

## 2014-05-14 DIAGNOSIS — M545 Low back pain: Secondary | ICD-10-CM | POA: Diagnosis not present

## 2014-05-14 DIAGNOSIS — W19XXXA Unspecified fall, initial encounter: Secondary | ICD-10-CM | POA: Diagnosis not present

## 2014-05-14 DIAGNOSIS — S32000A Wedge compression fracture of unspecified lumbar vertebra, initial encounter for closed fracture: Secondary | ICD-10-CM | POA: Diagnosis not present

## 2014-05-14 LAB — COMPREHENSIVE METABOLIC PANEL
ALBUMIN: 3.4 g/dL — AB (ref 3.5–5.0)
ALK PHOS: 67 U/L (ref 38–126)
ALT: 23 U/L (ref 14–54)
AST: 25 U/L (ref 15–41)
Anion gap: 7 (ref 5–15)
BUN: 14 mg/dL (ref 6–20)
CO2: 25 mmol/L (ref 22–32)
Calcium: 8.3 mg/dL — ABNORMAL LOW (ref 8.9–10.3)
Chloride: 102 mmol/L (ref 101–111)
Creatinine, Ser: 0.64 mg/dL (ref 0.44–1.00)
GFR calc non Af Amer: >60 mL/min (ref >60)
GLUCOSE: 87 mg/dL (ref 70–99)
POTASSIUM: 4.2 mmol/L (ref 3.5–5.1)
Sodium: 134 mmol/L — ABNORMAL LOW (ref 135–145)
Total Bilirubin: 0.5 mg/dL (ref 0.3–1.2)
Total Protein: 5.9 g/dL — ABNORMAL LOW (ref 6.5–8.1)

## 2014-05-14 LAB — GLUCOSE, CAPILLARY
GLUCOSE-CAPILLARY: 59 mg/dL — AB (ref 70–99)
GLUCOSE-CAPILLARY: 73 mg/dL (ref 70–99)
GLUCOSE-CAPILLARY: 73 mg/dL (ref 70–99)
Glucose-Capillary: 111 mg/dL — ABNORMAL HIGH (ref 70–99)
Glucose-Capillary: 81 mg/dL (ref 70–99)

## 2014-05-14 LAB — CBC
HCT: 31.3 % — ABNORMAL LOW (ref 36.0–46.0)
HEMOGLOBIN: 10 g/dL — AB (ref 12.0–15.0)
MCH: 31 pg (ref 26.0–34.0)
MCHC: 31.9 g/dL (ref 30.0–36.0)
MCV: 96.9 fL (ref 78.0–100.0)
PLATELETS: 164 10*3/uL (ref 150–400)
RBC: 3.23 MIL/uL — ABNORMAL LOW (ref 3.87–5.11)
RDW: 13.4 % (ref 11.5–15.5)
WBC: 4.1 10*3/uL (ref 4.0–10.5)

## 2014-05-14 LAB — HIV ANTIBODY (ROUTINE TESTING W REFLEX): HIV Screen 4th Generation wRfx: NONREACTIVE

## 2014-05-14 MED ORDER — OXYCODONE HCL 5 MG PO TABS
5.0000 mg | ORAL_TABLET | Freq: Four times a day (QID) | ORAL | Status: DC | PRN
  Administered 2014-05-14 – 2014-05-15 (×4): 5 mg via ORAL
  Filled 2014-05-14 (×4): qty 1

## 2014-05-14 MED ORDER — OXYCODONE-ACETAMINOPHEN 10-325 MG PO TABS: 1.0000 | ORAL_TABLET | Freq: Four times a day (QID) | ORAL | Status: DC | PRN

## 2014-05-14 MED ORDER — OXYCODONE-ACETAMINOPHEN 5-325 MG PO TABS
1.0000 | ORAL_TABLET | Freq: Four times a day (QID) | ORAL | Status: DC | PRN
  Administered 2014-05-14 – 2014-05-15 (×5): 1 via ORAL
  Filled 2014-05-14 (×5): qty 1

## 2014-05-14 NOTE — Clinical Social Work Placement (Signed)
CSW reviewed PT evaluation recommending SNF at discharge, spoke with patient who is agreeable with SNF at discharge. [facility], [facility].     Lincoln MaxinKelly Jericho Alcorn, LCSW [facility] Clinical Social Worker cell #: [phone]    CLINICAL SOCIAL WORK PLACEMENT  NOTE  Date:  05/14/2014  Patient Details  Name: Valerie HongLaura J Kondracki MRN: [id number] Date of Birth: 1960-10-09  Clinical Social Work is seeking post-discharge placement for this patient at the Skilled  Nursing Facility level of care (*CSW will initial, date and re-position this form in  chart as items are completed):  Yes   Patient/family provided with [place] Clinical Social Work Department's list of facilities offering this level of care within the geographic area requested by the patient (or if unable, by the patient's family).  Yes   Patient/family informed of their freedom to choose among providers that offer the needed level of care, that participate in Medicare, Medicaid or managed care program needed by the patient, have an available bed and are willing to accept the patient.  Yes   Patient/family informed of [facility]'s ownership interest in [facility] and [facility], as well as of the fact that they are under no obligation to receive care at these facilities.  PASRR submitted to EDS on 05/14/14     PASRR number received on 05/14/14     Existing PASRR number confirmed on       FL2 transmitted to all facilities in geographic area requested by pt/family on 05/14/14     FL2 transmitted to all facilities within larger geographic area on       Patient informed that his/her managed care company has contracts with or will negotiate with certain facilities, including the following:        Yes   Patient/family informed of bed offers received.  Patient chooses bed at       Physician recommends and patient chooses bed at       Patient to be transferred to   on  .  Patient to be transferred to facility by       Patient family notified on   of transfer.  Name of family member notified:        PHYSICIAN       Additional Comment:    _______________________________________________ Arlyss RepressHarrison, Melessa Cowell F, LCSW 05/14/2014, 3:00 PM

## 2014-05-14 NOTE — Evaluation (Signed)
Occupational Therapy Evaluation Patient Details Name: Valerie Silva MRN: [id number] DOB: 1960/02/22 Today's Date: 05/14/2014    History of Present Illness 54 yo female admitted with acute L4 compression fracture after sustaining falls at home. Hx of C4-C7 fusion, L1-L2 comp fx, chronic back and neck pain, chronic narcotic use, osteoporosis, fibromyalgia.    Clinical Impression   Pt was admitted for the above.  She was mod I with ADLs prior to admission, using SPC.  Pt has fallen twice in past couple of weeks and reports this is when she is awakened at night after taking medications.  Pt currently needs mod to max A for LB adls. Goals in acute are for supervision level.    Follow Up Recommendations  SNF (If pt refuses, HHOT)    Equipment Recommendations  3 in 1 bedside comode    Recommendations for Other Services       Precautions / Restrictions Precautions Precautions: Fall Restrictions Weight Bearing Restrictions: No      Mobility Bed Mobility Overal bed mobility: Needs Assistance Bed Mobility: Supine to Sit;Sit to Supine     Supine to sit: Min assist Sit to supine: Min assist   General bed mobility comments: assist for trunk for OOB; assist for legs for back to bed  Transfers Overall transfer level: Needs assistance Equipment used: Rolling walker (2 wheeled) Transfers: Sit to/from Stand Sit to Stand: Min assist         General transfer comment: Assist to rise, stabilize, control descent. VCs safety, technique, hand placement.     Balance Overall balance assessment: History of Falls         Standing balance support: Bilateral upper extremity supported Standing balance-Leahy Scale: Poor                              ADL Overall ADL's : Needs assistance/impaired     Grooming: Brushing hair;Sitting   Upper Body Bathing: Set up;Standing   Lower Body Bathing: Moderate assistance;Sit to/from stand   Upper Body Dressing : Minimal  assistance;Sitting (iv)   Lower Body Dressing: Maximal assistance;Sit to/from stand   Toilet Transfer: Minimal assistance;Stand-pivot;BSC;RW   Toileting- Clothing Manipulation and Hygiene: Minimal assistance;Sit to/from stand         General ADL Comments: pt used to bend forward to don LB ADLs.  Will educate on AE next visit.  Pt had a bad experience at SNF in the past. She states she falls if she is awoken from deep sleep and gets up.  Encouraged her to think about a different SNF as d/c plan.  If she will not consider SNF, encouraged her to go back to sleep without getting up.     Vision     Perception     Praxis      Pertinent Vitals/Pain Pain Assessment: 0-10 Pain Score: 10-Worst pain ever Pain Location: back, chest, ribs Pain Descriptors / Indicators: Aching Pain Intervention(s): Limited activity within patient's tolerance;Monitored during session;Premedicated before session;Repositioned (pt applied icy hot to back:  OK with RN)     Hand Dominance     Extremity/Trunk Assessment Upper Extremity Assessment Upper Extremity Assessment: Overall WFL for tasks assessed      Cervical / Trunk Assessment Cervical / Trunk Assessment: Normal   Communication Communication Communication: No difficulties   Cognition Arousal/Alertness: Awake/alert Behavior During Therapy: WFL for tasks assessed/performed Overall Cognitive Status: Within Functional Limits for tasks assessed  General Comments       Exercises       Shoulder Instructions      Home Living Family/patient expects to be discharged to:: Private residence                   Bathroom Shower/Tub: Tub/shower unit   Bathroom Toilet: Standard     Home Equipment: Cane - single point;Wheelchair - manual   Additional Comments: son was staying with her, but isn't at this time.  She states he isn't home much when he is there      Prior Functioning/Environment Level of  Independence: Independent with assistive device(s)        Comments: has fallen 2xs in past month.      OT Diagnosis: Acute pain;Generalized weakness   OT Problem List: Decreased strength;Decreased activity tolerance;Impaired balance (sitting and/or standing);Decreased knowledge of use of DME or AE;Pain   OT Treatment/Interventions: Self-care/ADL training;DME and/or AE instruction;Patient/family education;Balance training;Splinting    OT Goals(Current goals can be found in the care plan section) Acute Rehab OT Goals Patient Stated Goal: less pain. home OT Goal Formulation: With patient Time For Goal Achievement: 05/28/14 Potential to Achieve Goals: Good ADL Goals Pt Will Perform Lower Body Bathing: with supervision;with adaptive equipment;sit to/from stand Pt Will Perform Lower Body Dressing: with adaptive equipment;sit to/from stand;with supervision Pt Will Transfer to Toilet: with min guard assist;ambulating;bedside commode Pt Will Perform Toileting - Clothing Manipulation and hygiene: with supervision;sit to/from stand  OT Frequency: Min 2X/week   Barriers to D/C:            Co-evaluation              End of Session    Activity Tolerance: Patient limited by pain Patient left: in bed;with call bell/phone within reach;with bed alarm set   Time: 5621-30861359-1428 OT Time Calculation (min): 29 min Charges:  OT General Charges $OT Visit: 1 Procedure OT Evaluation $Initial OT Evaluation Tier I: 1 Procedure OT Treatments $Self Care/Home Management : 8-22 mins G-Codes:    An Lannan 05/14/2014, 3:27 PM  Marica OtterMaryellen Larance Ratledge, OTR/L [phone] 05/14/2014

## 2014-05-14 NOTE — Progress Notes (Signed)
Patient stated that she has not wore CPAP in years due to not being able to place anything on her head because it causes her to have headaches. RT will monitor as needed.

## 2014-05-14 NOTE — Progress Notes (Signed)
PROGRESS NOTE  Valerie HongLaura J Silva ZOX:[id number] DOB: [birth year] DOA: 05/13/2014 PCP: [name],[name], MD  Summary: 54 year old woman with multiple medical problems presented with a fall with acute on chronic back pain. On chronic narcotics and Xanax. Admitted for acute on chronic back pain, possible new lumbar compression fracture.  Assessment/Plan: 1. Subtle new lumbar compression fracture. No focal motor deficits or neurologic signs. No evidence of complicating features. Complains of significant pain, she has had vertebroplasty in the past and requests assessment by interventional radiology. 2. Fall prior to admission. Likely secondary to narcotics and Xanax. Consider orthostatic hypotension. She has had multiple falls in the past. 3. Hypoglycemia, chronic issue per patient. Asymptomatic. 4. Hypothyroidism 5. Anemia   Overall appears to be doing well. Interventional radiology consultation 5/5.  Anticipate transfer to skilled nursing facility 5/5.  Code Status: full code DVT prophylaxis: heparin Family Communication:  Disposition Plan: SNF  Brendia Sacksaniel Truc Winfree, MD  [facility]  Pager [phone] If 7PM-7AM, please contact night-coverage at [email or web address], password [email or web address] 05/14/2014, 4:49 PM  LOS: 1 day   Consultants:  OT: SNF  PT: SNF  Procedures:    Antibiotics:    HPI/Subjective: Complains of significant low back pain. Some right leg pain continues.  Objective: Filed Vitals:   05/14/14 0438 05/14/14 1001 05/14/14 1228 05/14/14 1512  BP: 98/69 112/72  121/79  Pulse: 58 62  63  Temp: 97 F (36.1 C)   98.3 F (36.8 C)  TempSrc: Oral   Oral  Resp: 15   20  Height:   5\' 8"  (1.727 m)   Weight:   94.7 kg (208 lb 12.4 oz)   SpO2: 100%   98%    Intake/Output Summary (Last 24 hours) at 05/14/14 1649 Last data filed at 05/14/14 1600  Gross per 24 hour  Intake 1481.67 ml  Output   2350 ml  Net -868.33 ml     Filed Weights   05/13/14 2019 05/14/14 1228  Weight:  94.711 kg (208 lb 12.8 oz) 94.7 kg (208 lb 12.4 oz)    Exam:     Afebrile, VSS, no hypoxia General:  Appears calm and comfortable Cardiovascular: RRR, no m/r/g.  Respiratory: CTA bilaterally, no w/r/r. Normal respiratory effort. Musculoskeletal: grossly normal tone BUE/BLE. She is able to sit in bed without assistance. She does have some bruising over her back. She does have multiple bruises over her lower legs as well. Strength bilateral lower extremities intact. Able to lift both legs off the bed. Psychiatric: grossly normal mood and affect, speech fluent and appropriate Neurologic: grossly non-focal.  New data reviewed: Sodium stable 134. Basic metabolic panel unremarkable. Hepatic function panel unremarkable.  Hemoglobin stable 10.0  Pertinent data since admission:  CT head and cervical spine unremarkable.  Rib films and thoracic spine film unremarkable  Lumbar spine film L4 vertebral body compression fracture, new   Scheduled Meds: . amLODipine  5 mg Oral Daily  . cholecalciferol  10,000 Units Oral Daily  . cyclobenzaprine  10 mg Oral TID  . diclofenac  75 mg Oral BID  . docusate sodium  100 mg Oral Once per day on Mon Wed Fri  . fluticasone  1 spray Each Nare Daily  . heparin  5,000 Units Subcutaneous 3 times per day  . insulin aspart  0-9 Units Subcutaneous TID WC  . [START ON 05/19/2014] levothyroxine  112 mcg Oral Once per day on Mon  . levothyroxine  224 mcg Oral Once per day on Sun Tue Wed Thu Fri Sat  .  lidocaine  1 patch Transdermal Daily  . magnesium oxide  400 mg Oral Daily  . modafinil  400 mg Oral q morning - 10a  . morphine  15 mg Oral Q12H  . pantoprazole  40 mg Oral Daily  . PARoxetine  20 mg Oral BID  . topiramate  200 mg Oral QHS   Continuous Infusions: . sodium chloride 100 mL/hr at 05/14/14 1640    Principal Problem:   Lumbar compression fracture Active Problems:   Hypothyroidism   OSA (obstructive sleep apnea)   Pernicious anemia    Bipolar I disorder   Iron deficiency anemia due to chronic blood loss   Chronic hyponatremia   Chronic narcotic use   Fall   Diabetes mellitus without complication   GERD (gastroesophageal reflux disease)   Back pain   Time spent 20 minutes

## 2014-05-14 NOTE — Evaluation (Signed)
Physical Therapy Evaluation Patient Details Name: Erlinda HongLaura J Disbrow MRN: [id number] DOB: [birth year] Today's Date: 05/14/2014   History of Present Illness  54 yo female admitted with acute L4 compression fracture after sustaining falls at home. Hx of C4-C7 fusion, L1-L2 comp fx, chronic back and neck pain, chronic narcotic use, osteoporosis, fibromyalgia.   Clinical Impression  On eval, pt required Min assist for mobility-only able to tolerate short distance of ~15 feet with RW. Pt tearful while ambulating-requested pain meds from RN at end of session. Discussed d/c plan-pt is hesitant to agree to placement due to poor previous experience. At this time recommend ST rehab at SNF if pt is agreeable.     Follow Up Recommendations SNF;Supervision/Assistance - 24 hour (if pt agreeable. If not agreeable, will recommend HHPT and 24 hour supervision/assist )    Equipment Recommendations  Rolling walker with 5" wheels    Recommendations for Other Services OT consult     Precautions / Restrictions Precautions Precautions: Fall Restrictions Weight Bearing Restrictions: No      Mobility  Bed Mobility Overal bed mobility: Needs Assistance Bed Mobility: Supine to Sit;Sit to Supine     Supine to sit: Min assist Sit to supine: Min assist   General bed mobility comments: Assist for trunk and LEs. Attempted to get pt to logroll but she performed task her own way. Increased time.   Transfers Overall transfer level: Needs assistance Equipment used: Rolling walker (2 wheeled) Transfers: Sit to/from Stand Sit to Stand: Min assist         General transfer comment: Assist to rise, stabilize, control descent. VCs safety, technique, hand placement.   Ambulation/Gait Ambulation/Gait assistance: Min assist Ambulation Distance (Feet): 15 Feet Assistive device: Rolling walker (2 wheeled) Gait Pattern/deviations: Trunk flexed;Decreased stride length;Step-through pattern     General Gait Details: VCs  safety. Assist to stabilize intermittently. slow gait speed. Pt tearful while walking  Stairs            Wheelchair Mobility    Modified Rankin (Stroke Patients Only)       Balance Overall balance assessment: History of Falls;Needs assistance         Standing balance support: Bilateral upper extremity supported;During functional activity Standing balance-Leahy Scale: Poor                               Pertinent Vitals/Pain Pain Assessment: 0-10 Pain Score: 10-Worst pain ever Pain Location: back, R LE Pain Descriptors / Indicators: Aching;Shooting Pain Intervention(s): Monitored during session;Limited activity within patient's tolerance    Home Living Family/patient expects to be discharged to:: Private residence Living Arrangements: Alone   Type of Home: Apartment Home Access: Stairs to enter Entrance Stairs-Rails: Right Entrance Stairs-Number of Steps: 2 Home Layout: One level Home Equipment: Cane - single point;Wheelchair - manual      Prior Function Level of Independence: Independent with assistive device(s)         Comments: uses cane, reprots she doesn't start her day until 10-11am. She stays in bed most of the time due her back pain.     Hand Dominance        Extremity/Trunk Assessment   Upper Extremity Assessment: Defer to OT evaluation           Lower Extremity Assessment: Generalized weakness      Cervical / Trunk Assessment: Normal  Communication   Communication: No difficulties  Cognition Arousal/Alertness: Awake/alert Behavior During Therapy: [facility]  for tasks assessed/performed Overall Cognitive Status: Within Functional Limits for tasks assessed                      General Comments      Exercises        Assessment/Plan    PT Assessment Patient needs continued PT services  PT Diagnosis Difficulty walking;Generalized weakness;Acute pain   PT Problem List Decreased activity tolerance;Decreased  balance;Decreased mobility;Pain  PT Treatment Interventions DME instruction;Gait training;Functional mobility training;Therapeutic activities;Therapeutic exercise;Patient/family education;Balance training   PT Goals (Current goals can be found in the Care Plan section) Acute Rehab PT Goals Patient Stated Goal: less pain. home PT Goal Formulation: With patient Time For Goal Achievement: 05/28/14 Potential to Achieve Goals: Fair    Frequency Min 3X/week   Barriers to discharge        Co-evaluation               End of Session Equipment Utilized During Treatment: Gait belt Activity Tolerance: Patient limited by pain Patient left: in bed;with call bell/phone within reach           Time: 1105-1120 PT Time Calculation (min) (ACUTE ONLY): 15 min   Charges:   PT Evaluation $Initial PT Evaluation Tier I: 1 Procedure     PT G Codes:        Rebeca AlertJannie Alohilani Levenhagen, MPT Pager: [phone]

## 2014-05-14 NOTE — Progress Notes (Signed)
Initial Nutrition Assessment  DOCUMENTATION CODES:  Obesity unspecified  INTERVENTION: - Continue current diet  NUTRITION DIAGNOSIS:  Inadequate oral intake related to other (see comment) (depression, lack of interest in preparing meals) as evidenced by per patient/family report.  GOAL:  Patient will meet greater than or equal to 90% of their needs  MONITOR:  PO intake, Labs, Weight trends, I & O's  REASON FOR ASSESSMENT:  Malnutrition Screening Tool    ASSESSMENT: Pt seen for MST. Pt with hx of GERD, DM, hypothyroidism, depression and anxiety, anemia, osteoporosis, arthritis, gastric bypass, and GIB.  Pt with BMI indicating obesity. She states that her parents passed away 5 years ago and since that time she has not had a good appetite. She also states that she lives alone and does not feel motivated to cook for one person so she often eats items such as yogurt, chili from [facility] or a cheese sandwich. Pt also states she has many malabsorptive issues for which she takes multiple vitamins.  No intakes documented; unable to assess if she is meeting needs fully. Will monitor for need for supplements. Labs and medications reviewed. Physical assessment does not show signs of muscle wasting.  Height:  Ht Readings from Last 1 Encounters:  05/14/14 5\' 8"  (1.727 m)    Weight:  Wt Readings from Last 1 Encounters:  05/14/14 208 lb 12.4 oz (94.7 kg)    Ideal Body Weight:  63.6 kg (kg)  Wt Readings from Last 10 Encounters:  05/14/14 208 lb 12.4 oz (94.7 kg)  04/14/14 213 lb 6.4 oz (96.798 kg)  04/09/14 208 lb (94.348 kg)  03/15/14 208 lb 6.4 oz (94.53 kg)  01/15/14 206 lb (93.441 kg)  11/04/13 230 lb 6.1 oz (104.5 kg)  10/07/13 216 lb (97.977 kg)  09/30/13 218 lb (98.884 kg)  07/01/13 236 lb (107.049 kg)  03/06/13 261 lb (118.389 kg)    BMI:  Body mass index is 31.75 kg/(m^2).  Estimated Nutritional Needs:  Kcal:  1400-1600  Protein:  90-110 grams  Fluid:   1.5-2L/day  Skin:  Reviewed, no issues  Diet Order:  Diet Carb Modified Fluid consistency:: Thin; Room service appropriate?: Yes  EDUCATION NEEDS:  No education needs identified at this time   Intake/Output Summary (Last 24 hours) at 05/14/14 1233 Last data filed at 05/14/14 0548  Gross per 24 hour  Intake 1241.67 ml  Output    650 ml  Net 591.67 ml    Last BM:  PTA   Trenton GammonJessica Sadat Sliwa, RD, LDN Inpatient Clinical Dietitian Pager # [phone] After hours/weekend pager # [phone]

## 2014-05-15 ENCOUNTER — Observation Stay (HOSPITAL_COMMUNITY): Payer: Medicare Other

## 2014-05-15 DIAGNOSIS — S32000A Wedge compression fracture of unspecified lumbar vertebra, initial encounter for closed fracture: Secondary | ICD-10-CM | POA: Diagnosis not present

## 2014-05-15 DIAGNOSIS — W19XXXA Unspecified fall, initial encounter: Secondary | ICD-10-CM | POA: Diagnosis not present

## 2014-05-15 DIAGNOSIS — F1190 Opioid use, unspecified, uncomplicated: Secondary | ICD-10-CM | POA: Diagnosis not present

## 2014-05-15 DIAGNOSIS — M545 Low back pain: Secondary | ICD-10-CM | POA: Diagnosis not present

## 2014-05-15 LAB — GLUCOSE, CAPILLARY: GLUCOSE-CAPILLARY: 74 mg/dL (ref 70–99)

## 2014-05-15 MED ORDER — ALPRAZOLAM 2 MG PO TABS: ORAL_TABLET | ORAL | Status: DC

## 2014-05-15 MED ORDER — OXYMORPHONE HCL ER 30 MG PO TB12: 30.0000 mg | ORAL_TABLET | Freq: Two times a day (BID) | ORAL | Status: DC

## 2014-05-15 MED ORDER — OXYCODONE-ACETAMINOPHEN 10-325 MG PO TABS: 1.0000 | ORAL_TABLET | Freq: Four times a day (QID) | ORAL | Status: DC | PRN

## 2014-05-15 MED ORDER — ALPRAZOLAM 1 MG PO TABS
1.0000 mg | ORAL_TABLET | Freq: Two times a day (BID) | ORAL | Status: DC | PRN
Start: 2014-05-15 — End: 2014-05-15

## 2014-05-15 NOTE — Progress Notes (Signed)
[facility] RN accepting report for facility. VSS  No Acute changes with Pt'sassesment at time of discharge

## 2014-05-15 NOTE — Care Management Note (Addendum)
Case Management Note  Patient Details  Name: Valerie Silva MRN: [id number] Date of Birth: 05/27/1960  Subjective/Objective:                    Action/Plan: CM met with pt, gave ABN; pt refused to sign and CSW, Kelly witnessed pt receipt of ABN at 15:15.  Pt states she is not going anywhere at this time.  16:40CM met with pt in room and pt has decided to go to SNF; CSW arranged and Floor RN is calling report.  Ambulance transport has been arranged by CSW.  No other CM needs were communicated.   , BSN, CM [phone].  Expected Discharge Date:   (unknown)               Expected Discharge Plan:  Skilled Nursing Facility  In-House Referral:     Discharge planning Services  CM Consult  Post Acute Care Choice:    Choice offered to:     DME Arranged:    DME Agency:     HH Arranged:    HH Agency:     Status of Service:  Completed, signed off  Medicare Important Message Given:    Date Medicare IM Given:    Medicare IM give by:    Date Additional Medicare IM Given:    Additional Medicare Important Message give by:     If discussed at Long Length of Stay Meetings, dates discussed:    Additional Comments:  ,  Christine, RN 05/15/2014, 4:46 PM  

## 2014-05-15 NOTE — Consult Note (Signed)
Chief Complaint: Chief Complaint  Patient presents with  . Back Pain  pt suffered fall 3 days ago  Referring Physician(s): Dr Irene LimboGoodrich  History of Present Illness: Valerie HongLaura J Heiler is a 54 y.o. 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 Osteoporosis   . Hypothyroidism   . Diabetes mellitus   . Exogenous obesity   . Rhinitis   . Arthritis   . Fibromyalgia   . Somnolence   . Sleep apnea   . Anemia, iron deficiency 04/06/2011  . Pernicious anemia 04/06/2011  . Depression   . Anxiety   . Iron deficiency anemia, unspecified 12/12/2012  . Iron deficiency anemia due to chronic blood loss 01/15/2014  . GERD (gastroesophageal reflux disease)   . GIB (gastrointestinal bleeding)   . H/O gastric bypass     Past Surgical History  Procedure Laterality Date  . Gastric bypass    . Cholecystectomy    . Tracheostomy    . Uvulectomy    . Tonsillectomy    . Mandible reconstruction    . Spinal fusion    . Dilation and curettage of uterus    . Cesarean section      Allergies: Iodine; Iron; Lovaza; Soma; Sulfonamide derivatives; Benazepril hcl; Codeine; Lyrica; Penicillins; Zolpidem tartrate; and Amitriptyline  Medications: Prior to Admission medications   Medication Sig Start Date End Date Taking? Authorizing Provider  acarbose (PRECOSE) 25 MG tablet Take 25 mg by mouth daily as needed (for low blood sugar).  10/09/12  Yes Historical Provider, MD  Cholecalciferol (VITAMIN D-3) 5000 UNITS TABS Take 2 tablets by mouth daily.   Yes Historical  Provider, MD  Cyanocobalamin (VITAMIN B-12 IJ) Inject as directed every 28 (twenty-eight) days.   Yes Historical Provider, MD  cyclobenzaprine (FLEXERIL) 10 MG tablet Take 1 tablet (10 mg total) by mouth 3 (three) times daily. 11/03/13  Yes Renae FickleMackenzie Short, MD  diclofenac (VOLTAREN) 75 MG EC tablet Take 75 mg by mouth 2 (two) times daily.   Yes Historical Provider, MD  docusate sodium 100 MG CAPS Take 100 mg by mouth 2 (two) times daily. Patient taking differently: Take 100 mg by mouth 3 (three) times a week.  11/03/13  Yes Renae FickleMackenzie Short, MD  furosemide (LASIX) 20 MG tablet Take 20 mg by mouth daily at 12 noon. 03/24/14  Yes Historical Provider, MD  KLOR-CON M20 20 MEQ tablet Take 1 tablet by mouth daily. 03/24/14  Yes Historical Provider, MD  levothyroxine (SYNTHROID, LEVOTHROID) 112 MCG tablet Take 112-224 mcg by mouth daily before breakfast. Pt takes 224mcg every day, but on Monday she takes 112 mcg.   Yes Historical Provider, MD  loratadine (CLARITIN) 10 MG tablet Take 1 tablet (10 mg total) by mouth 2 (two) times daily. Patient taking differently: Take 10 mg by mouth daily as needed for allergies.  07/13/12  Yes Thermon LeylandLaura A Davis, NP  Magnesium 250 MG TABS Take 1 tablet by mouth daily.   Yes Historical Provider, MD  modafinil (PROVIGIL) 200 MG tablet Take 2 tablets (400 mg  total) by mouth every morning. 11/05/13  Yes Mahima Pandey, MD  mometasone (NASONEX) 50 MCG/ACT nasal spray Place 2 sprays into the nose daily.   Yes Historical Provider, MD  omeprazole (PRILOSEC) 40 MG capsule Take 1 capsule (40 mg total) by mouth daily. 07/13/12  Yes Thermon Leyland, NP  PARoxetine (PAXIL) 20 MG tablet Take 20 mg by mouth 2 (two) times daily. 03/24/14  Yes Historical Provider, MD  promethazine (PHENERGAN) 25 MG tablet Take 25 mg by mouth every 8 (eight) hours as needed for nausea.    Yes Historical Provider, MD  SUMAtriptan (IMITREX) 100 MG tablet Take 100 mg by mouth every 2 (two) hours as needed for migraine.   05/01/12  Yes Historical Provider, MD  topiramate (TOPAMAX) 200 MG tablet Take 1 tablet (200 mg total) by mouth at bedtime. 07/13/12  Yes Thermon Leyland, NP  valACYclovir (VALTREX) 1000 MG tablet Take 1,000 mg by mouth daily as needed (for flare ups).  08/16/11  Yes Historical Provider, MD  alprazolam Prudy Feeler) 2 MG tablet Take 1 tablet by mouth twice daily as needed for anxiety 05/15/14   Standley Brooking, MD  oxyCODONE-acetaminophen (PERCOCET) 10-325 MG per tablet Take 1 tablet by mouth every 6 (six) hours as needed (severe pain). 05/15/14   Standley Brooking, MD  oxymorphone (OPANA ER) 30 MG 12 hr tablet Take 1 tablet (30 mg total) by mouth every 12 (twelve) hours. 05/15/14   Standley Brooking, MD     Family History  Problem Relation Age of Onset  . CAD Father   . Diabetes Father   . CAD Brother      X3  . Hypertension Brother   . Colon cancer Brother     History   Social History  . Marital Status: Married    Spouse Name: N/A  . Number of Children: 1  . Years of Education: hs   Occupational History  . disability    Social History Main Topics  . Smoking status: Never Smoker   . Smokeless tobacco: Never Used     Comment: never used tobacco  . Alcohol Use: 0.0 oz/week    0 Standard drinks or equivalent per week     Comment: one mixed drink twice yearly  . Drug Use: No     Comment: never used illicit drugs  . Sexual Activity: No   Other Topics Concern  . None   Social History Narrative    Review of Systems: A 12 point ROS discussed and pertinent positives are indicated in the HPI above.  All other systems are negative.  Review of Systems  Constitutional: Positive for activity change. Negative for fever, appetite change and unexpected weight change.  Respiratory: Negative for shortness of breath.   Gastrointestinal: Negative for abdominal pain.  Genitourinary: Negative for difficulty urinating.  Musculoskeletal: Positive for back pain and gait problem.  Neurological: Positive  for weakness.  Psychiatric/Behavioral: Negative for behavioral problems and confusion.    Vital Signs: BP 107/67 mmHg  Pulse 58  Temp(Src) 98.6 F (37 C) (Oral)  Resp 19  Ht  (1.727 m)  Wt 94.7 kg (208 lb 12.4 oz)  BMI 31.75 kg/m2  SpO2 100%  Physical Exam  Constitutional: She is oriented to person, place, and time. She appears well-nourished.  Cardiovascular: Normal rate, regular rhythm and normal heart sounds.   No murmur heard. Pulmonary/Chest: Effort normal and breath sounds normal. She has no wheezes.  Abdominal: Soft. Bowel sounds are normal.  Musculoskeletal: Normal range of motion. She exhibits tenderness.  Low back pain Difficulty moving in bed  Neurological: She is alert and oriented to person, place, and time.  Skin: Skin is warm and dry.  Psychiatric: She has a normal mood and affect. Her behavior is normal. Judgment and thought content normal.  Nursing note and vitals reviewed.   Mallampati Score:  MD Evaluation Airway: WNL Heart: WNL Abdomen: WNL Chest/ Lungs: WNL ASA  Classification: 3 Mallampati/Airway Score: One  Imaging: Dg Ribs Unilateral W/chest Right  05/13/2014   CLINICAL DATA:  Fall at home today. Right-sided rib pain. Initial encounter.  EXAM: RIGHT RIBS AND CHEST - 3+ VIEW  COMPARISON:  One-view chest 04/11/2014  FINDINGS: The heart size is normal. The lungs are clear. Rightward curvature of the thoracic spine is again noted. Cervical spine fusion is present.  Dedicated imaging of the right ribs demonstrates no acute or healing fracture. There is no pneumothorax.  IMPRESSION: No acute fracture.   Electronically Signed   By: Marin Roberts M.D.   On: 05/13/2014 17:04   Dg Thoracic Spine 2 View  05/13/2014   CLINICAL DATA:  Fall at home today. Severe low back pain and right leg pain. Right anterior rib pain.  EXAM: THORACIC SPINE - 2 VIEW  COMPARISON:  Two-view chest x-ray 03/10/2014  FINDINGS: Twelve rib-bearing thoracic type vertebral  bodies are present. Rightward curvature of the lower thoracic spine is stable. Endplate degenerative changes are noted throughout the thoracic spine. Vertebral body heights are stable. Cervical spine fusion is noted.  IMPRESSION: 1. Multilevel degenerative changes throughout the thoracic spine without evidence for acute fracture or traumatic subluxation.   Electronically Signed   By: Marin Roberts M.D.   On: 05/13/2014 17:02   Dg Lumbar Spine Complete  05/13/2014   CLINICAL DATA:  Larey Seat at home earlier this morning, severe low back pain, RIGHT leg pain, RIGHT anterior side mid rib cage pain, history osteoporosis  EXAM: LUMBAR SPINE - COMPLETE 4+ VIEW  COMPARISON:  05/03/2013  FINDINGS: Five non-rib-bearing lumbar vertebrae.  Diffuse osseous demineralization.  Prior spinal augmentation procedures at L1 and L2 with mild residual anterior height loss greater at L1.  Slight disc space narrowing at L2-L3 and L3-L4.  New slight anterior height loss of the L4 vertebral body consistent with a subtle compression fracture, better appreciated on coned-down view.  No subluxation, bone destruction or spondylolysis.  SI joints symmetric.  Scattered surgical clips and pelvic phleboliths.  IMPRESSION: Osseous demineralization with prior spinal augmentation procedures at fractures of L1 and L2.  New subtle compression fracture L4 vertebral body.   Electronically Signed   By: Ulyses Southward M.D.   On: 05/13/2014 17:04   Ct Head Wo Contrast  05/13/2014   CLINICAL DATA:  Patient fell over pet, hitting head, yesterday. Head pain. Neck pain.  EXAM: CT HEAD WITHOUT CONTRAST  CT CERVICAL SPINE WITHOUT CONTRAST  TECHNIQUE: Multidetector CT imaging of the head and cervical spine was performed following the standard protocol without intravenous contrast. Multiplanar CT image reconstructions of the cervical spine were also generated.  COMPARISON:  None.  FINDINGS: CT HEAD FINDINGS  No evidence for acute infarction, hemorrhage, mass  lesion, hydrocephalus, or extra-axial fluid. Mild atrophy. Hypoattenuation of white matter suggesting chronic microvascular ischemic change. Calvarium intact. Mild frontal soft tissue swelling. No sinus or mastoid air fluid level. Negative appearing orbits.  CT CERVICAL SPINE FINDINGS  There is no visible cervical spine fracture, traumatic subluxation, prevertebral soft tissue swelling,  or intraspinal hematoma. Previous cervical fusion with anterior plating from C4 through C7. 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 Minor disc pathology at C7-T1 and T1-2. Lung apices clear. No neck masses. No significant atherosclerosis. Airway midline.  IMPRESSION: Mild frontal soft tissue swelling. No skull fracture or intracranial hemorrhage.  Status post C4-C7 ACDF. 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 No acute cervical spine pathology.   Electronically Signed   By: Davonna Belling M.D.   On: 05/13/2014 16:47   Ct Cervical Spine Wo Contrast  05/13/2014   CLINICAL DATA:  Patient fell over pet, hitting head, yesterday. Head pain. Neck pain.  EXAM: CT HEAD WITHOUT CONTRAST  CT CERVICAL SPINE WITHOUT CONTRAST  TECHNIQUE: Multidetector CT imaging of the head and cervical spine was performed following the standard protocol without intravenous contrast. Multiplanar CT image reconstructions of the cervical spine were also generated.  COMPARISON:  None.  FINDINGS: CT HEAD FINDINGS  No evidence for acute infarction, hemorrhage, mass lesion, hydrocephalus, or extra-axial fluid. Mild atrophy. Hypoattenuation of white matter suggesting chronic microvascular ischemic change. Calvarium intact. Mild frontal soft tissue swelling. No sinus or mastoid air fluid level. Negative appearing orbits.  CT CERVICAL SPINE FINDINGS  There is no visible cervical spine fracture, traumatic subluxation, prevertebral soft tissue swelling, or intraspinal hematoma. Previous cervical fusion with anterior plating from C4 through C7. 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 Minor disc pathology at C7-T1 and T1-2.  Lung apices clear. No neck masses. No significant atherosclerosis. Airway midline.  IMPRESSION: Mild frontal soft tissue swelling. No skull fracture or intracranial hemorrhage.  Status post C4-C7 ACDF. 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 No acute cervical spine pathology.   Electronically Signed   By: Davonna Belling M.D.   On: 05/13/2014 16:47   Mr Lumbar Spine Wo Contrast  05/15/2014   CLINICAL DATA:  Fall 2 days ago.  Evaluate compression fracture.  EXAM: MRI LUMBAR SPINE WITHOUT CONTRAST  TECHNIQUE: Multiplanar, multisequence MR imaging of the lumbar spine was performed. No intravenous contrast was administered.  COMPARISON:  Lumbar radiographs 05/13/2014, lumbar MRI 06/23/2010  FINDINGS: Mild compression fracture L4. There is a horizontal cleft in the vertebra with mild surrounding edema. This appears to be an acute or subacute fracture and was not present on the prior MRI. Mild loss of height as noted on the MRI and recent radiographic study. No other acute fracture. Chronic fracture deformities with cement vertebral augmentation L1 and L2. No evidence of metastatic disease.  Conus medullaris is normal and terminates at L1-2.  T12-L1:  Mild disc and facet degeneration  L1-2:  Mild disc and facet degeneration  L2-3: Mild disc bulging and spurring with mild facet hypertrophy. Mild spinal stenosis  L3-4: Disc bulging and spurring with mild facet hypertrophy and mild spinal stenosis.  L4-5: Mild disc bulging and mild facet degeneration with mild spinal stenosis. Mild foraminal narrowing bilaterally  L5-S1:  Small central disc protrusion without neural impingement.  IMPRESSION: Acute/subacute mild fracture of L4 without compromise of the spinal canal  Chronic fractures with cement vertebral augmentation at L1 and L2  Multilevel lumbar degenerative changes with mild spinal stenosis at L2-3 and L3-4 and L4-5. Small central disc protrusion L5-S1.   Electronically Signed   By: Marlan Palau M.D.   On: 05/15/2014 12:12     Labs:  CBC:  Recent Labs  04/11/14 0730 04/12/14 0330 05/13/14 1608 05/14/14 0508  WBC 3.8* 4.1 5.2 4.1  HGB 9.6* 9.9* 10.0* 10.0*  HCT 27.8* 29.7* 31.1* 31.3*  PLT 146* 174 198 164    COAGS:  Recent Labs  03/14/14 1056 05/13/14 2200  INR 1.02 1.09  APTT 37  --     BMP:  Recent Labs  04/13/14 1200 04/14/14 0405 05/13/14 1608 05/14/14 0508  NA 134* 134* 131* 134*  K 4.7 5.2* 4.2 4.2  CL 105 104 102 102  CO2 23 25 23 25   GLUCOSE 79 81 68* 87  BUN 17 17 17 14   CALCIUM 8.5 8.3* 8.5* 8.3*  CREATININE 0.81 0.98 0.67 0.64  GFRNONAA 81* 65* >60 >60  GFRAA >90 75* >60 >60    LIVER FUNCTION TESTS:  Recent Labs  10/04/13 1336  03/10/14 2224 04/09/14 1350 04/12/14 0330 05/14/14 0508  BILITOT 0.2*  < > 0.7 0.40 0.2* 0.5  AST 14  < > 26 24 14 25   ALT 19  < > 25 22 16 23   ALKPHOS 83  < > 51 55 48 67  PROT 7.1  < > 6.5 7.2 5.3* 5.9*  ALBUMIN 3.8  --  3.7  --  3.0* 3.4*  < > = values in this interval not displayed.  TUMOR MARKERS: No results for input(s): AFPTM, CEA, CA199, CHROMGRNA in the last 8760 hours.  Assessment and Plan:  Worsening back pain since fall 5/2 at home L4 fx on xray Acute fx confirmed on MRI Plan for possible L4 KP[facility]/14 Risks and Benefits discussed with the patient including, but not limited to education regarding the natural healing process of compression fractures without intervention, bleeding, infection, cement migration which may cause spinal cord damage, paralysis, pulmonary embolism or even death. All of the patient's questions were answered, patient is agreeable to proceed. Consent signed and in chart. We will move forward asap when can  Insurance is in pre certification now---must be approved to move forward If pt discharged---this procedure can be performed as OP  Thank you for this interesting consult.  I greatly enjoyed meeting Valerie Silva and look forward to participating in their  care.  Signed: Ozzie Remmers A 05/15/2014, 4:12 PM   I spent a total of 40 Minutes    in face to face in clinical consultation, greater than 50% of which was counseling/coordinating care for L4 KP/VP

## 2014-05-15 NOTE — Progress Notes (Signed)
PROGRESS NOTE  Erlinda HongLaura J Callicott ZOX:[id number] DOB: 1960/08/17 DOA: 05/13/2014 PCP: [name],[name], MD  Summary: 54 year old woman with multiple medical problems presented with a fall with acute on chronic back pain. On chronic narcotics and Xanax. Admitted for acute on chronic back pain, possible new lumbar compression fracture.  Assessment/Plan 1. acute lumbar compression fracture L4.as noted on MRI. No focal motor deficits or neurologic signs. No evidence by imaging of complicating features.  2. Multiple falls prior to admission. Suspect related to narcotics and Xanax. Suggest outpatient follow-up, consider weaning these medications as able. 3. Chronic asymptomatic hypoglycemia, stable.  4.  normocytic anemia. Stable.     Overall doing well. No complicating features.   Pain management with oral therapy.   Consideration of intervention per radiology   D/c telemetry   Stable for discharge pending case management review and scheduling of radiology procedure.  Code Status: full code DVT prophylaxis: SCDs Family Communication: none  Disposition Plan: SNF 5/5 or 5/6  Brendia Sacksaniel Josephina Melcher, MD  [facility]  Pager [phone] If 7PM-7AM, please contact night-coverage at [email or web address], password [email or web address] 05/15/2014, 12:47 PM  LOS: 2 days   Consultants:  Interventional radiology  Procedures:    Antibiotics:    HPI/Subjective: Continues to have some back pain. No paresthesias. No bowel or bladder dysfunction. No sacral dysesthesia. No weakness.  Objective: Filed Vitals:   05/14/14 1228 05/14/14 1512 05/14/14 2100 05/15/14 0444  BP:  121/79 109/73 107/67  Pulse:  63 68 58  Temp:  98.3 F (36.8 C) 97.5 F (36.4 C) 98.6 F (37 C)  TempSrc:  Oral Oral Oral  Resp:  20 18 19   Height: 5\' 8"  (1.727 m)     Weight: 94.7 kg (208 lb 12.4 oz)     SpO2:  98% 100% 100%    Intake/Output Summary (Last 24 hours) at 05/15/14 1247 Last data filed at 05/15/14 1119  Gross per 24 hour    Intake    240 ml  Output   4850 ml  Net  -4610 ml     Filed Weights   05/13/14 2019 05/14/14 1228  Weight: 94.711 kg (208 lb 12.8 oz) 94.7 kg (208 lb 12.4 oz)    Exam:     Afebrile, vital signs stable. No hypoxia.  General:  Appears calm and comfortable Cardiovascular: RRR, no m/r/g.  Telemetry: SR, no arrhythmias  Respiratory: CTA bilaterally, no w/r/r. Normal respiratory effort. Musculoskeletal: grossly normal tone BUE/BLE. Able to lift both legs off the bed. Sensation appears intact. Able to move around in bed unassisted.  Psychiatric: grossly normal mood and affect, speech fluent and appropriate   New data   CBG stable.  MRI lumbar spine with acute/subacute fracture L4 without compromise of the spinal canal.  Scheduled Meds: . amLODipine  5 mg Oral Daily  . cholecalciferol  10,000 Units Oral Daily  . cyclobenzaprine  10 mg Oral TID  . diclofenac  75 mg Oral BID  . docusate sodium  100 mg Oral Once per day on Mon Wed Fri  . fluticasone  1 spray Each Nare Daily  . [START ON 05/19/2014] levothyroxine  112 mcg Oral Once per day on Mon  . levothyroxine  224 mcg Oral Once per day on Sun Tue Wed Thu Fri Sat  . lidocaine  1 patch Transdermal Daily  . magnesium oxide  400 mg Oral Daily  . modafinil  400 mg Oral q morning - 10a  . morphine  15 mg Oral Q12H  . pantoprazole  40 mg Oral Daily  . PARoxetine  20 mg Oral BID  . topiramate  200 mg Oral QHS   Continuous Infusions:   Principal Problem:   Lumbar compression fracture Active Problems:   Hypothyroidism   OSA (obstructive sleep apnea)   Pernicious anemia   Bipolar I disorder   Iron deficiency anemia due to chronic blood loss   Chronic hyponatremia   Chronic narcotic use   Fall   Diabetes mellitus without complication   GERD (gastroesophageal reflux disease)   Back pain

## 2014-05-15 NOTE — Sedation Documentation (Signed)
Pt's procedure is covered by her insurance company under her inpatient stay. JM

## 2014-05-15 NOTE — Progress Notes (Signed)
Patient ID: Valerie Silva, female   DOB: [birth year], 54 y.o.   MRN: [id number]   New L4 fracture per plain xrays  Post fall at home on Tue Pain in low back "10" on pain scale Can move in the bed  Has had L1 and L2 previous VP 2012 with good relief  Discussed with Dr Corliss Skainseveshwar MRI T10-S1 today Scheduler is pre certifying Ins  Will await results of MRI and to hear from Ins

## 2014-05-15 NOTE — Progress Notes (Signed)
   05/14/14 1400  OT Time Calculation  OT Start Time (ACUTE ONLY) 1359  OT Stop Time (ACUTE ONLY) 1428  OT Time Calculation (min) 29 min  OT G-codes **NOT FOR INPATIENT CLASS**  Functional Assessment Tool Used clinical judgment and observation  Functional Limitation Self care  Self Care Current Status (X9147(G8987) CL  Self Care Goal Status (W2956(G8988) CI  OT General Charges  $OT Visit 1 Procedure  OT Evaluation  $Initial OT Evaluation Tier I 1 Procedure  OT Treatments  $Self Care/Home Management  8-22 mins  Marica OtterMaryellen Makennah Omura, OTR/L [phone] 05/15/2014

## 2014-05-15 NOTE — Progress Notes (Signed)
Patient ID: Valerie Silva, female   DOB: [birth year], 54 y.o.   MRN: [id number]    MRI has been reviewed with Dr Corliss Skainseveshwar Pt is an appropriate candidate for Lumbar 4 Vertebrolasty/kyphoplasty  Planned for DC to SNF today per Dr Irene LimboGoodrich We will contact pt with time and date of procedure to be performed as OP

## 2014-05-15 NOTE — Progress Notes (Signed)
Code 44 and ABN given to pt. Pt states, "I called my insurance co and they said I could stay here." Pt refuse to sign ABN and continue to want to stay here in the hospital. Copy on chart.

## 2014-05-15 NOTE — Progress Notes (Signed)
   05/14/14 1259  PT Time Calculation  PT Start Time (ACUTE ONLY) 1105  PT Stop Time (ACUTE ONLY) 1120  PT Time Calculation (min) (ACUTE ONLY) 15 min  PT G-Codes **NOT FOR INPATIENT CLASS**  Functional Assessment Tool Used (clinical judgement)  Functional Limitation Mobility: Walking and moving around  Mobility: Walking and Moving Around Current Status (Z6109(G8978) CJ  Mobility: Walking and Moving Around Goal Status (U0454(G8979) CI  PT General Charges  $$ ACUTE PT VISIT 1 Procedure  PT Evaluation  $Initial PT Evaluation Tier I 1 Procedure   Rebeca AlertJannie Druscilla Petsch, MPT [phone]

## 2014-05-15 NOTE — Discharge Summary (Addendum)
Physician Discharge Summary  Erlinda HongLaura J Brunell ZOX:[id number] DOB: [birth year] DOA: 05/13/2014  PCP: [name],[name], MD  Admit date: 05/13/2014 Discharge date: 05/15/2014  Recommendations for Outpatient Follow-up:  1. Follow-up acute lumbar compression fracture L4. Outpatient intervention per radiology interventional as clinically indicated. Check with office for appointment (not available currently, they are waiting for insurance) 2. Multiple falls prior to admission. Suspect related to narcotics and Xanax. Suggest consideration of weaning narcotics and adnexa as an outpatient. 3. Follow-up normocytic anemia   Follow-up Information    Follow up with [name],[name], MD. Schedule an appointment as soon as possible for a visit in 2 weeks.   Specialty:  Family Medicine   Contact information:   [id number] [place] [address] [phone]       Follow up with TURPIN,PAMELA A, PA-C.   Specialty:  Radiology   Why:  office will call you for appointment for spine procedure   Contact information:   [address] [place] [address] [phone]      Discharge Diagnoses:  1. Acute lumbar compression fracture L4 2. Multiple falls prior to admission 3. Normocytic anemia  Discharge Condition: Improved Disposition: Home  Diet recommendation: Regular  Filed Weights   05/13/14 2019 05/14/14 1228  Weight: 94.711 kg (208 lb 12.8 oz) 94.7 kg (208 lb 12.4 oz)    History of present illness:  54 year old woman with multiple medical problems presented with a fall with acute on chronic back pain. On chronic narcotics and Xanax. Admitted for acute on chronic back pain, possible new lumbar compression fracture.  Hospital Course:  [name] was admitted for further evaluation of suspected acute compression fracture. This was confirmed with MRI. Pain was controlled with oral agents. MRI imaging and clinical exam revealed no evidence of complicating features. She was  evaluated by therapy with recommendations for skilled nursing facility placement. She will be discharged to facility today and will return as an outpatient for follow-up with interventional radiology.  1. Acute lumbar compression fracture L4. As noted on MRI. No focal motor deficits or neurologic signs. No evidence by imaging of complicating features.  2. Multiple falls prior to admission. Suspect related to narcotics and Xanax. Suggest outpatient follow-up, consider weaning these medications as able. 3. Chronic asymptomatic hypoglycemia, stable.  4. normocytic anemia. Stable.  Consultants:  Interventional radiology  Discharge Instructions   Current Discharge Medication List    CONTINUE these medications which have CHANGED   Details  alprazolam (XANAX) 2 MG tablet Take 1 tablet by mouth twice daily as needed for anxiety Qty: 20 tablet, Refills: 0   Associated Diagnoses: Depression with anxiety    oxyCODONE-acetaminophen (PERCOCET) 10-325 MG per tablet Take 1 tablet by mouth every 6 (six) hours as needed (severe pain). Qty: 30 tablet, Refills: 0    oxymorphone (OPANA ER) 30 MG 12 hr tablet Take 1 tablet (30 mg total) by mouth every 12 (twelve) hours. Qty: 10 tablet, Refills: 0   Associated Diagnoses: Pernicious anemia; Iron deficiency anemia due to chronic blood loss      CONTINUE these medications which have NOT CHANGED   Details  acarbose (PRECOSE) 25 MG tablet Take 25 mg by mouth daily as needed (for low blood sugar).    Associated Diagnoses: Depression with anxiety; Pernicious anemia; Iron deficiency anemia, unspecified    Cholecalciferol (VITAMIN D-3) 5000 UNITS TABS Take 2 tablets by mouth daily.    Cyanocobalamin (VITAMIN B-12 IJ) Inject as directed every 28 (twenty-eight)  days.    cyclobenzaprine (FLEXERIL) 10 MG tablet Take 1 tablet (10 mg total) by mouth 3 (three) times daily. Qty: 90 tablet, Refills: 0    diclofenac (VOLTAREN) 75 MG EC tablet Take 75 mg by mouth  2 (two) times daily.    docusate sodium 100 MG CAPS Take 100 mg by mouth 2 (two) times daily. Qty: 10 capsule, Refills: 0    furosemide (LASIX) 20 MG tablet Take 20 mg by mouth daily at 12 noon.    KLOR-CON M20 20 MEQ tablet Take 1 tablet by mouth daily.    levothyroxine (SYNTHROID, LEVOTHROID) 112 MCG tablet Take 112-224 mcg by mouth daily before breakfast. Pt takes every day, but on Monday she takes 112 mcg.    loratadine (CLARITIN) 10 MG tablet Take 1 tablet (10 mg total) by mouth 2 (two) times daily.    Magnesium 250 MG TABS Take 1 tablet by mouth daily.    modafinil (PROVIGIL) 200 MG tablet Take 2 tablets (400 mg total) by mouth every morning. Qty: 60 tablet, Refills: 5   Associated Diagnoses: Depression with anxiety; Pernicious anemia    mometasone (NASONEX) 50 MCG/ACT nasal spray Place 2 sprays into the nose daily.    omeprazole (PRILOSEC) 40 MG capsule Take 1 capsule (40 mg total) by mouth daily. Qty: 30 capsule, Refills: 1    PARoxetine (PAXIL) 20 MG tablet Take 20 mg by mouth 2 (two) times daily.    SUMAtriptan (IMITREX) 100 MG tablet Take 100 mg by mouth every 2 (two) hours as needed for migraine.     topiramate (TOPAMAX) 200 MG tablet Take 1 tablet (200 mg total) by mouth at bedtime. Qty: 30 tablet, Refills: 0    valACYclovir (VALTREX) 1000 MG tablet Take 1,000 mg by mouth daily as needed (for flare ups).       STOP taking these medications     promethazine (PHENERGAN) 25 MG tablet        Allergies  Allergen Reactions  . Iodine Other (See Comments)    Sensitive when used gynecologically (intra-vaginally).  . Iron Itching    Iron taken by mouth causes this reaction. The patient has had several iron infusions plus Feraheme that has not caused any side effects.   . Lovaza [Omega-3-Acid Ethyl Esters] Itching  . Soma [Carisoprodol] Other (See Comments)    Pt states that this medication causes panic attacks.   . Sulfonamide Derivatives Other (See  Comments)    Sensitive when used intra-vaginally.  . Benazepril Hcl Other (See Comments)    Reaction:  Severe cough   . Codeine Other (See Comments)    Pt states that this medication causes rapid heartbeat.   Roselee Nova [Pregabalin]     Pt can not remember  . Penicillins Other (See Comments)    Reaction: Severe family allergy.  Family physician long ago stated she shouldn't take this med since mother and brother nearly died from anaphylaxis.  Marland Kitchen Zolpidem Tartrate Itching and Other (See Comments)    Pt states that this medication causes blackouts.   . Amitriptyline Other (See Comments)    Pt states that this medication causes sleep walking.     The results of significant diagnostics from this hospitalization (including imaging, microbiology, ancillary and laboratory) are listed below for reference.    Significant Diagnostic Studies: Dg Ribs Unilateral W/chest Right  05/13/2014   CLINICAL DATA:  Fall at home today. Right-sided rib pain. Initial encounter.  EXAM: RIGHT RIBS AND CHEST - 3+ VIEW  COMPARISON:  One-view chest 04/11/2014  FINDINGS: The heart size is normal. The lungs are clear. Rightward curvature of the thoracic spine is again noted. Cervical spine fusion is present.  Dedicated imaging of the right ribs demonstrates no acute or healing fracture. There is no pneumothorax.  IMPRESSION: No acute fracture.   Electronically Signed   By: Marin Roberts M.D.   On: 05/13/2014 17:04   Dg Thoracic Spine 2 View  05/13/2014   CLINICAL DATA:  Fall at home today. Severe low back pain and right leg pain. Right anterior rib pain.  EXAM: THORACIC SPINE - 2 VIEW  COMPARISON:  Two-view chest x-ray 03/10/2014  FINDINGS: Twelve rib-bearing thoracic type vertebral bodies are present. Rightward curvature of the lower thoracic spine is stable. Endplate degenerative changes are noted throughout the thoracic spine. Vertebral body heights are stable. Cervical spine fusion is noted.  IMPRESSION: 1. Multilevel  degenerative changes throughout the thoracic spine without evidence for acute fracture or traumatic subluxation.   Electronically Signed   By: Marin Roberts M.D.   On: 05/13/2014 17:02   Dg Lumbar Spine Complete  05/13/2014   CLINICAL DATA:  Larey Seat at home earlier this morning, severe low back pain, RIGHT leg pain, RIGHT anterior side mid rib cage pain, history osteoporosis  EXAM: LUMBAR SPINE - COMPLETE 4+ VIEW  COMPARISON:  05/03/2013  FINDINGS: Five non-rib-bearing lumbar vertebrae.  Diffuse osseous demineralization.  Prior spinal augmentation procedures at L1 and L2 with mild residual anterior height loss greater at L1.  Slight disc space narrowing at L2-L3 and L3-L4.  New slight anterior height loss of the L4 vertebral body consistent with a subtle compression fracture, better appreciated on coned-down view.  No subluxation, bone destruction or spondylolysis.  SI joints symmetric.  Scattered surgical clips and pelvic phleboliths.  IMPRESSION: Osseous demineralization with prior spinal augmentation procedures at fractures of L1 and L2.  New subtle compression fracture L4 vertebral body.   Electronically Signed   By: Ulyses Southward M.D.   On: 05/13/2014 17:04   Ct Head Wo Contrast  05/13/2014   CLINICAL DATA:  Patient fell over pet, hitting head, yesterday. Head pain. Neck pain.  EXAM: CT HEAD WITHOUT CONTRAST  CT CERVICAL SPINE WITHOUT CONTRAST  TECHNIQUE: Multidetector CT imaging of the head and cervical spine was performed following the standard protocol without intravenous contrast. Multiplanar CT image reconstructions of the cervical spine were also generated.  COMPARISON:  None.  FINDINGS: CT HEAD FINDINGS  No evidence for acute infarction, hemorrhage, mass lesion, hydrocephalus, or extra-axial fluid. Mild atrophy. Hypoattenuation of white matter suggesting chronic microvascular ischemic change. Calvarium intact. Mild frontal soft tissue swelling. No sinus or mastoid air fluid level. Negative  appearing orbits.  CT CERVICAL SPINE FINDINGS  There is no visible cervical spine fracture, traumatic subluxation, prevertebral soft tissue swelling, or intraspinal hematoma. Previous cervical fusion with anterior plating from C4 through C7. 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 Minor disc pathology at C7-T1 and T1-2. Lung apices clear. No neck masses. No significant atherosclerosis. Airway midline.  IMPRESSION: Mild frontal soft tissue swelling. No skull fracture or intracranial hemorrhage.  Status post C4-C7 ACDF. 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 No acute cervical spine pathology.   Electronically Signed   By: Davonna Belling M.D.   On: 05/13/2014 16:47   Ct Cervical Spine Wo Contrast  05/13/2014   CLINICAL DATA:  Patient fell over pet, hitting head, yesterday. Head pain. Neck pain.  EXAM: CT HEAD WITHOUT CONTRAST  CT CERVICAL SPINE WITHOUT CONTRAST  TECHNIQUE:  Multidetector CT imaging of the head and cervical spine was performed following the standard protocol without intravenous contrast. Multiplanar CT image reconstructions of the cervical spine were also generated.  COMPARISON:  None.  FINDINGS: CT HEAD FINDINGS  No evidence for acute infarction, hemorrhage, mass lesion, hydrocephalus, or extra-axial fluid. Mild atrophy. Hypoattenuation of white matter suggesting chronic microvascular ischemic change. Calvarium intact. Mild frontal soft tissue swelling. No sinus or mastoid air fluid level. Negative appearing orbits.  CT CERVICAL SPINE FINDINGS  There is no visible cervical spine fracture, traumatic subluxation, prevertebral soft tissue swelling, or intraspinal hematoma. Previous cervical fusion with anterior plating from C4 through C7. 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 Minor disc pathology at C7-T1 and T1-2. Lung apices clear. No neck masses. No significant atherosclerosis. Airway midline.  IMPRESSION: Mild frontal soft tissue swelling. No skull fracture or intracranial hemorrhage.  Status post C4-C7 ACDF. 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 No acute  cervical spine pathology.   Electronically Signed   By: Davonna BellingJohn  Curnes M.D.   On: 05/13/2014 16:47   Mr Lumbar Spine Wo Contrast  05/15/2014   CLINICAL DATA:  Fall 2 days ago.  Evaluate compression fracture.  EXAM: MRI LUMBAR SPINE WITHOUT CONTRAST  TECHNIQUE: Multiplanar, multisequence MR imaging of the lumbar spine was performed. No intravenous contrast was administered.  COMPARISON:  Lumbar radiographs 05/13/2014, lumbar MRI 06/23/2010  FINDINGS: Mild compression fracture L4. There is a horizontal cleft in the vertebra with mild surrounding edema. This appears to be an acute or subacute fracture and was not present on the prior MRI. Mild loss of height as noted on the MRI and recent radiographic study. No other acute fracture. Chronic fracture deformities with cement vertebral augmentation L1 and L2. No evidence of metastatic disease.  Conus medullaris is normal and terminates at L1-2.  T12-L1:  Mild disc and facet degeneration  L1-2:  Mild disc and facet degeneration  L2-3: Mild disc bulging and spurring with mild facet hypertrophy. Mild spinal stenosis  L3-4: Disc bulging and spurring with mild facet hypertrophy and mild spinal stenosis.  L4-5: Mild disc bulging and mild facet degeneration with mild spinal stenosis. Mild foraminal narrowing bilaterally  L5-S1:  Small central disc protrusion without neural impingement.  IMPRESSION: Acute/subacute mild fracture of L4 without compromise of the spinal canal  Chronic fractures with cement vertebral augmentation at L1 and L2  Multilevel lumbar degenerative changes with mild spinal stenosis at L2-3 and L3-4 and L4-5. Small central disc protrusion L5-S1.   Electronically Signed   By: Marlan Palauharles  Clark M.D.   On: 05/15/2014 12:12      Labs: Basic Metabolic Panel:  Recent Labs Lab 05/13/14 1608 05/14/14 0508  NA 131* 134*  K 4.2 4.2  CL 102 102  CO2 23 25  GLUCOSE 68* 87  BUN 17 14  CREATININE 0.67 0.64  CALCIUM 8.5* 8.3*   Liver Function  Tests:  Recent Labs Lab 05/14/14 0508  AST 25  ALT 23  ALKPHOS 67  BILITOT 0.5  PROT 5.9*  ALBUMIN 3.4*    CBC:  Recent Labs Lab 05/13/14 1608 05/14/14 0508  WBC 5.2 4.1  NEUTROABS 3.8  --   HGB 10.0* 10.0*  HCT 31.1* 31.3*  MCV 95.7 96.9  PLT 198 164       Recent Labs  03/11/14 0146  BNP 58.6     CBG:  Recent Labs Lab 05/14/14 0755 05/14/14 1147 05/14/14 1635 05/14/14 2106 05/15/14 0810  GLUCAP 73 59* 111* 81 74    Principal Problem:  Lumbar compression fracture Active Problems:   Hypothyroidism   OSA (obstructive sleep apnea)   Pernicious anemia   Bipolar I disorder   Iron deficiency anemia due to chronic blood loss   Chronic hyponatremia   Chronic narcotic use   Fall   Diabetes mellitus without complication   GERD (gastroesophageal reflux disease)   Back pain   Time coordinating discharge: 40 minutes  Signed:  Brendia Sacks, MD [facility] 05/15/2014, 1:25 PM

## 2014-05-15 NOTE — Progress Notes (Signed)
Occupational Therapy Treatment Patient Details Name: Valerie Silva MRN: [id number] DOB: 06/27/1960 Today's Date: 05/15/2014    History of present illness 54 yo female admitted with acute L4 compression fracture after sustaining falls at home. Hx of C4-C7 fusion, L1-L2 comp fx, chronic back and neck pain, chronic narcotic use, osteoporosis, fibromyalgia.    OT comments  Pt limited by pain, but worked with sock aide this am.  Will need reinforcement  Follow Up Recommendations  SNF    Equipment Recommendations   (HHOT if pt refuses)    Recommendations for Other Services      Precautions / Restrictions Precautions Precautions: Fall Restrictions Weight Bearing Restrictions: No       Mobility Bed Mobility       Sidelying to sit: Min assist       General bed mobility comments: assist for RLE  Transfers   Equipment used: Rolling walker (2 wheeled) Transfers: Sit to/from Stand Sit to Stand: Min assist         General transfer comment: Assist to rise, stabilize, control descent. VCs safety, technique, hand placement.     Balance Overall balance assessment: History of Falls                                 ADL                       Lower Body Dressing: Maximal assistance;With adaptive equipment (socks, seated with sock aide)   Toilet Transfer: Minimal assistance;Stand-pivot;BSC   Toileting- Clothing Manipulation and Hygiene: Minimal assistance;Sit to/from stand         General ADL Comments: pt was on commode when I arrived.  Assisted to EOB and practiced with sock aide.  Pt was in pain, so returned to supine and educated on rest of AE.  Issued kit.      Vision                     Perception     Praxis      Cognition   Behavior During Therapy: WFL for tasks assessed/performed Overall Cognitive Status: Within Functional Limits for tasks assessed                       Extremity/Trunk Assessment                Exercises     Shoulder Instructions       General Comments      Pertinent Vitals/ Pain       Pain Score: 10-Worst pain ever Pain Location: back Pain Descriptors / Indicators: Aching Pain Intervention(s): Limited activity within patient's tolerance;Monitored during session;RN gave pain meds during session;Repositioned  Home Living                                          Prior Functioning/Environment              Frequency       Progress Toward Goals  OT Goals(current goals can now be found in the care plan section)  Progress towards OT goals: Progressing toward goals     Plan      Co-evaluation                 End of Session  Activity Tolerance Patient limited by pain   Patient Left in bed;with call bell/phone within reach;with bed alarm set   Nurse Communication          Time: 4562-5638 OT Time Calculation (min): 13 min  Charges: OT General Charges $OT Visit: 1 Procedure OT Treatments $Self Care/Home Management : 8-22 mins  Isaiahs Chancy 05/15/2014, 9:02 AM  Lesle Chris, OTR/L [phone] 05/15/2014

## 2014-05-16 NOTE — Clinical Social Work Placement (Signed)
   CLINICAL SOCIAL WORK PLACEMENT  NOTE  Date:  05/16/2014  Patient Details  Name: Valerie Silva MRN: [id number] Date of Birth: [birth year]  Clinical Social Work is seeking post-discharge placement for this patient at the Skilled  Nursing Facility level of care (*CSW will initial, date and re-position this form in  chart as items are completed):  Yes   Patient/family provided with [facility] Clinical Social Work Department's list of facilities offering this level of care within the geographic area requested by the patient (or if unable, by the patient's family).  Yes   Patient/family informed of their freedom to choose among providers that offer the needed level of care, that participate in Medicare, Medicaid or managed care program needed by the patient, have an available bed and are willing to accept the patient.  Yes   Patient/family informed of [facility]'s ownership interest in [facility] and [facility], as well as of the fact that they are under no obligation to receive care at these facilities.  PASRR submitted to EDS on 05/14/14     PASRR number received on 05/14/14     Existing PASRR number confirmed on       FL2 transmitted to all facilities in geographic area requested by pt/family on 05/14/14     FL2 transmitted to all facilities within larger geographic area on       Patient informed that his/her managed care company has contracts with or will negotiate with certain facilities, including the following:        Yes   Patient/family informed of bed offers received.  [facility]     Physician recommends and patient chooses bed at      [facility].  Patient to be transferred to facility by [facility]     Patient family notified on 05/16/14 of transfer.  Name of family member notified:  patient informed her son via phone     PHYSICIAN       Additional Comment:     _______________________________________________ Arlyss RepressHarrison, Saad Buhl F, LCSW 05/16/2014, 9:32 AM

## 2014-05-19 ENCOUNTER — Other Ambulatory Visit: Payer: Self-pay | Admitting: Internal Medicine

## 2014-05-21 ENCOUNTER — Other Ambulatory Visit: Payer: Self-pay | Admitting: Internal Medicine

## 2014-05-21 DIAGNOSIS — IMO0002 Reserved for concepts with insufficient information to code with codable children: Secondary | ICD-10-CM

## 2014-05-27 ENCOUNTER — Other Ambulatory Visit: Payer: Medicare Other

## 2014-06-03 ENCOUNTER — Ambulatory Visit
Admission: RE | Admit: 2014-06-03 | Discharge: 2014-06-03 | Disposition: A | Discharge status: Home / Self Care | Payer: Medicare Other | Source: Ambulatory Visit | Attending: Internal Medicine | Admitting: Internal Medicine

## 2014-06-03 VITALS — BP 107/67 | HR 73 | Temp 98.3°F | Resp 14

## 2014-06-03 DIAGNOSIS — IMO0002 Reserved for concepts with insufficient information to code with codable children: Secondary | ICD-10-CM

## 2014-06-03 DIAGNOSIS — S32000A Wedge compression fracture of unspecified lumbar vertebra, initial encounter for closed fracture: Secondary | ICD-10-CM

## 2014-06-03 MED ORDER — MIDAZOLAM HCL 2 MG/2ML IJ SOLN
1.0000 mg | INTRAMUSCULAR | Status: DC | PRN
  Administered 2014-06-03 (×2): 1 mg via INTRAVENOUS

## 2014-06-03 MED ORDER — FENTANYL CITRATE (PF) 100 MCG/2ML IJ SOLN
25.0000 ug | INTRAMUSCULAR | Status: DC | PRN
  Administered 2014-06-03: 50 ug via INTRAVENOUS
  Administered 2014-06-03 (×3): 25 ug via INTRAVENOUS

## 2014-06-03 MED ORDER — KETOROLAC TROMETHAMINE 30 MG/ML IJ SOLN
30.0000 mg | Freq: Once | INTRAMUSCULAR | Status: AC
  Administered 2014-06-03: 30 mg via INTRAVENOUS

## 2014-06-03 MED ORDER — SODIUM CHLORIDE 0.9 % IV SOLN
Freq: Once | INTRAVENOUS | Status: AC
  Administered 2014-06-03: 12:00:00 via INTRAVENOUS

## 2014-06-03 MED ORDER — VANCOMYCIN HCL IN DEXTROSE 1-5 GM/200ML-% IV SOLN
1000.0000 mg | Freq: Once | INTRAVENOUS | Status: AC
  Administered 2014-06-03: 1000 mg via INTRAVENOUS

## 2014-06-03 NOTE — Discharge Instructions (Signed)
Vertebroplasty Post Procedure Discharge Instructions  1. May resume a regular diet and any medications that you routinely take (including pain medications). 2. No driving day of procedure. 3. Upon discharge go home and rest for at least 4 hours.  May use an ice pack as needed to injection sites on back. 4. Remove dressing after shower in the morning. Replace with bandaide and change daily until healed. 5. No heavy lifting.  6. Follow up with your primary care physician to see if bone therapy is needed.  7. Follow up with Dr. Alfredo BattyMattern in about 2 weeks.    Please contact our office at [phone] for the following symptoms:   Fever greater than 100 degrees  Increased swelling, pain, or redness at injection site.   [facility] you [facility] visiting [facility].

## 2014-06-11 ENCOUNTER — Other Ambulatory Visit: Payer: Self-pay | Admitting: *Deleted

## 2014-06-11 DIAGNOSIS — F418 Other specified anxiety disorders: Secondary | ICD-10-CM

## 2014-06-11 MED ORDER — ALPRAZOLAM 2 MG PO TABS: ORAL_TABLET | ORAL | Status: DC

## 2014-06-12 ENCOUNTER — Ambulatory Visit (INDEPENDENT_AMBULATORY_CARE_PROVIDER_SITE_OTHER): Payer: Medicare Other | Admitting: Neurology

## 2014-06-12 ENCOUNTER — Encounter: Payer: Self-pay | Admitting: Neurology

## 2014-06-12 VITALS — BP 124/80 | HR 79 | Ht 67.0 in | Wt 209.0 lb

## 2014-06-12 DIAGNOSIS — M545 Low back pain: Secondary | ICD-10-CM | POA: Diagnosis not present

## 2014-06-12 DIAGNOSIS — G43019 Migraine without aura, intractable, without status migrainosus: Secondary | ICD-10-CM

## 2014-06-12 DIAGNOSIS — R269 Unspecified abnormalities of gait and mobility: Secondary | ICD-10-CM

## 2014-06-12 HISTORY — DX: Unspecified abnormalities of gait and mobility: R26.9

## 2014-06-12 HISTORY — DX: Migraine without aura, intractable, without status migrainosus: G43.019

## 2014-06-12 MED ORDER — PROPRANOLOL HCL 10 MG PO TABS: ORAL_TABLET | ORAL | Status: DC

## 2014-06-12 NOTE — Progress Notes (Signed)
Reason for visit: Migraine headache  Valerie HongLaura J Silva is an 54 y.o. female  History of present illness:  [name] is a 54 year old left-handed white female with a history of chronic migraine headache. She has not been seen through this office since 01/23/2012. The patient has had a series of hospitalizations associated with falling. The patient has been noted to have significant hyponatremia with some of these admissions, the sodium level was down to 120 around 04/11/2014. The patient indicates that she has sustained several concussions because of the falls. She has had a chronic gait disorder. She has chronic low back pain, followed by Dr. Charlett BlakeVoytek. The patient indicates that she will occasionally fall at nighttime when she takes her alprazolam, 2 mg tablets, for sleep. The patient is on chronic daily opiate drugs for her back pain. She continues to have her migraine headaches on a daily basis. The headaches are on the top of the head, sometimes in the back of the head associated with a viselike squeezing sensation, occasional throbbing. She may have photophobia and phonophobia with the headache, and she may have nausea, no vomiting. She may have some blurring of vision as well. She denies any numbness or weakness of the extremities. She comes to this office for further evaluation.  Past Medical History  Diagnosis Date  . Osteoporosis   . Hypothyroidism   . Diabetes mellitus   . Exogenous obesity   . Rhinitis   . Arthritis   . Fibromyalgia   . Somnolence   . Sleep apnea   . Anemia, iron deficiency 04/06/2011  . Pernicious anemia 04/06/2011  . Depression   . Anxiety   . Iron deficiency anemia, unspecified 12/12/2012  . Iron deficiency anemia due to chronic blood loss 01/15/2014  . GERD (gastroesophageal reflux disease)   . GIB (gastrointestinal bleeding)   . H/O gastric bypass   . Common migraine with intractable migraine 06/12/2014  . Gait disorder 06/12/2014    Past Surgical History    Procedure Laterality Date  . Gastric bypass    . Cholecystectomy    . Tracheostomy    . Uvulectomy    . Tonsillectomy    . Mandible reconstruction    . Spinal fusion    . Dilation and curettage of uterus    . Cesarean section      Family History  Problem Relation Age of Onset  . CAD Father   . Diabetes Father   . CAD Brother      X3  . Hypertension Brother   . Colon cancer Brother     Social history:  reports that she has never smoked. She has never used smokeless tobacco. She reports that she does not drink alcohol or use illicit drugs.    Allergies  Allergen Reactions  . Ambien [Zolpidem Tartrate] Itching and Other (See Comments)    Pt states that this medication causes blackouts.   . Benazepril Hcl Other (See Comments)    Severe cough   . Codeine Other (See Comments)    Pt states that this medication causes rapid heartbeat.   . Iodine Other (See Comments)    Sensitive when used gynecologically (intra-vaginally).  . Iron Itching    Iron taken by mouth causes this reaction. The patient has had several iron infusions plus Feraheme that has not caused any side effects.   . Lovaza [Omega-3-Acid Ethyl Esters] Itching  . Soma [Carisoprodol] Other (See Comments)    Pt states that this medication causes panic  attacks.   . Sulfonamide Derivatives Other (See Comments)    Sensitive when used intra-vaginally.  Roselee Nova [Pregabalin]     Pt can not remember  . Penicillins Other (See Comments)    Severe family allergy.  Family physician long ago stated she shouldn't take this med since mother and brother nearly died from anaphylaxis.  . Amitriptyline Other (See Comments)    Pt states that this medication causes sleep walking.     Medications:  Prior to Admission medications   Medication Sig Start Date End Date Taking? Authorizing Provider  acarbose (PRECOSE) 25 MG tablet Take 25 mg by mouth daily as needed (for low blood sugar).  10/09/12  Yes Historical Provider, MD   alprazolam Prudy Feeler) 2 MG tablet Take two tablets by mouth twice daily as needed for anxiety; Take two tablets by mouth at bedtime as needed for sleep or anxiety 06/11/14  Yes Josph Macho, MD  baclofen (LIORESAL) 10 MG tablet Take 5 mg by mouth every 4 (four) hours as needed for muscle spasms.   Yes Historical Provider, MD  Cholecalciferol (VITAMIN D-3) 5000 UNITS TABS Take 2 tablets by mouth daily.   Yes Historical Provider, MD  Cyanocobalamin (VITAMIN B-12 IJ) Inject as directed every 28 (twenty-eight) days.   Yes Historical Provider, MD  cyclobenzaprine (FLEXERIL) 10 MG tablet Take 1 tablet (10 mg total) by mouth 3 (three) times daily. 11/03/13  Yes Renae Fickle, MD  diclofenac (VOLTAREN) 75 MG EC tablet Take 75 mg by mouth 2 (two) times daily.   Yes Historical Provider, MD  docusate sodium 100 MG CAPS Take 100 mg by mouth 2 (two) times daily. Patient taking differently: Take 100 mg by mouth 3 (three) times a week.  11/03/13  Yes Renae Fickle, MD  furosemide (LASIX) 20 MG tablet Take 20 mg by mouth daily at 12 noon. 03/24/14  Yes Historical Provider, MD  ibuprofen (ADVIL,MOTRIN) 800 MG tablet  04/22/14  Yes Historical Provider, MD  KLOR-CON M20 20 MEQ tablet Take 1 tablet by mouth daily. 03/24/14  Yes Historical Provider, MD  levothyroxine (SYNTHROID, LEVOTHROID) 300 MCG tablet Take 300 mcg by mouth daily before breakfast.   Yes Historical Provider, MD  loratadine (CLARITIN) 10 MG tablet Take 1 tablet (10 mg total) by mouth 2 (two) times daily. Patient taking differently: Take 10 mg by mouth daily as needed for allergies.  07/13/12  Yes Thermon Leyland, NP  Magnesium 250 MG TABS Take 1 tablet by mouth daily.   Yes Historical Provider, MD  modafinil (PROVIGIL) 200 MG tablet Take 2 tablets (400 mg total) by mouth every morning. 11/05/13  Yes Mahima Pandey, MD  mometasone (NASONEX) 50 MCG/ACT nasal spray Place 2 sprays into the nose daily.   Yes Historical Provider, MD  omeprazole (PRILOSEC) 40 MG  capsule Take 1 capsule (40 mg total) by mouth daily. 07/13/12  Yes Thermon Leyland, NP  Oxycodone HCl 10 MG TABS Take 10 mg by mouth every 4 (four) hours as needed.    Yes Historical Provider, MD  oxymorphone (OPANA ER) 30 MG 12 hr tablet Take 1 tablet (30 mg total) by mouth every 12 (twelve) hours. 05/15/14  Yes Standley Brooking, MD  PARoxetine (PAXIL) 20 MG tablet Take 20 mg by mouth 2 (two) times daily. 03/24/14  Yes Historical Provider, MD  SUMAtriptan (IMITREX) 100 MG tablet Take 100 mg by mouth every 2 (two) hours as needed for migraine.  05/01/12  Yes Historical Provider, MD  topiramate (TOPAMAX)  200 MG tablet Take 1 tablet (200 mg total) by mouth at bedtime. 07/13/12  Yes Thermon Leyland, NP  valACYclovir (VALTREX) 1000 MG tablet Take 1,000 mg by mouth daily as needed (for flare ups).  08/16/11  Yes Historical Provider, MD    ROS:  Out of a complete 14 system review of symptoms, the patient complains only of the following symptoms, and all other reviewed systems are negative.  Hearing loss, ringing in the ears, runny nose Decreased appetite Eye itching, eye sensitivity, double vision Shortness of breath Leg swelling Cold intolerance, heat intolerance, excessive thirst Abdominal pain, rectal bleeding, constipation, diarrhea, nausea Insomnia Environmental allergies Joint pain, joint swelling, back pain, achy muscles, muscle cramps, walking difficulty, neck pain, neck stiffness Itching Bruising easily, anemia Memory loss, headache, seizures, weakness, tremor Depression, anxiety  Blood pressure 124/80, pulse 79, height  (1.702 m), weight 209 lb (94.802 kg).  Physical Exam  General: The patient is alert and cooperative at the time of the examination.  Skin: No significant peripheral edema is noted.   Neurologic Exam  Mental status: The patient is alert and oriented x 3 at the time of the examination. The patient has apparent normal recent and remote memory, with an apparently normal  attention span and concentration ability.   Cranial nerves: Facial symmetry is present. Speech is normal, no aphasia or dysarthria is noted. Extraocular movements are full. Visual fields are full.  Motor: The patient has good strength in all 4 extremities.  Sensory examination: Soft touch sensation is symmetric on the face, arms, and legs.  Coordination: The patient has good finger-nose-finger and heel-to-shin bilaterally.  Gait and station: The patient has a normal gait. Tandem gait is slightly unsteady. Romberg is negative. No drift is seen.  Reflexes: Deep tendon reflexes are symmetric.   Assessment/Plan:  1. Intractable migraine  2. Gait disturbance  The patient has multiple medical issues. She will be sent for blood work today to evaluate the gait problem. She is to be set up for physical therapy in the home environment. The patient will be placed on low-dose propranolol for the migraine, she can will continue on the Topamax 200 mg at night. The patient takes Imitrex if needed for the migraine which does seem to help. She will follow-up in 3-4 months.  Marlan Palau MD 06/12/2014 8:21 PM  [facility] [address] [place], [place] [address]  Phone [phone] Fax [phone]

## 2014-06-12 NOTE — Patient Instructions (Signed)

## 2014-06-14 LAB — COPPER, SERUM: Copper: 73 ug/dL (ref 72–166)

## 2014-06-14 LAB — RPR: RPR Ser Ql: NONREACTIVE

## 2014-06-14 LAB — METHYLMALONIC ACID, SERUM: METHYLMALONIC ACID: 341 nmol/L (ref 0–378)

## 2014-06-16 ENCOUNTER — Telehealth: Payer: Self-pay

## 2014-06-16 NOTE — Telephone Encounter (Signed)
Spoke to patient and relayed results 

## 2014-06-16 NOTE — Telephone Encounter (Signed)
-----   Message from York Spanielharles K Willis, MD sent at 06/15/2014  6:59 PM EDT -----  The blood work results are unremarkable. Please call the patient.  ----- Message -----    From: [facility] Lab Results In Interface    Sent: 06/13/2014   7:44 AM      To: York Spanielharles K Willis, MD

## 2014-07-10 ENCOUNTER — Ambulatory Visit: Payer: Medicare Other

## 2014-07-10 ENCOUNTER — Ambulatory Visit: Payer: Medicare Other | Admitting: Hematology & Oncology

## 2014-07-10 ENCOUNTER — Other Ambulatory Visit: Payer: Medicare Other

## 2014-07-28 ENCOUNTER — Emergency Department (HOSPITAL_COMMUNITY)
Admission: EM | Admit: 2014-07-28 | Discharge: 2014-07-28 | Disposition: A | Discharge status: Home / Self Care | Payer: Medicare Other | Attending: Emergency Medicine | Admitting: Emergency Medicine

## 2014-07-28 ENCOUNTER — Emergency Department (HOSPITAL_COMMUNITY): Payer: Medicare Other

## 2014-07-28 ENCOUNTER — Encounter (HOSPITAL_COMMUNITY): Payer: Self-pay

## 2014-07-28 DIAGNOSIS — M797 Fibromyalgia: Secondary | ICD-10-CM | POA: Diagnosis not present

## 2014-07-28 DIAGNOSIS — M1990 Unspecified osteoarthritis, unspecified site: Secondary | ICD-10-CM | POA: Insufficient documentation

## 2014-07-28 DIAGNOSIS — M546 Pain in thoracic spine: Secondary | ICD-10-CM

## 2014-07-28 DIAGNOSIS — S3992XA Unspecified injury of lower back, initial encounter: Secondary | ICD-10-CM | POA: Diagnosis not present

## 2014-07-28 DIAGNOSIS — G43909 Migraine, unspecified, not intractable, without status migrainosus: Secondary | ICD-10-CM | POA: Diagnosis not present

## 2014-07-28 DIAGNOSIS — W19XXXA Unspecified fall, initial encounter: Secondary | ICD-10-CM

## 2014-07-28 DIAGNOSIS — Z9884 Bariatric surgery status: Secondary | ICD-10-CM | POA: Diagnosis not present

## 2014-07-28 DIAGNOSIS — M810 Age-related osteoporosis without current pathological fracture: Secondary | ICD-10-CM | POA: Diagnosis not present

## 2014-07-28 DIAGNOSIS — F419 Anxiety disorder, unspecified: Secondary | ICD-10-CM | POA: Insufficient documentation

## 2014-07-28 DIAGNOSIS — W1839XA Other fall on same level, initial encounter: Secondary | ICD-10-CM | POA: Insufficient documentation

## 2014-07-28 DIAGNOSIS — Y929 Unspecified place or not applicable: Secondary | ICD-10-CM | POA: Diagnosis not present

## 2014-07-28 DIAGNOSIS — G8929 Other chronic pain: Secondary | ICD-10-CM

## 2014-07-28 DIAGNOSIS — Z79899 Other long term (current) drug therapy: Secondary | ICD-10-CM | POA: Diagnosis not present

## 2014-07-28 DIAGNOSIS — F329 Major depressive disorder, single episode, unspecified: Secondary | ICD-10-CM | POA: Insufficient documentation

## 2014-07-28 DIAGNOSIS — Z8709 Personal history of other diseases of the respiratory system: Secondary | ICD-10-CM | POA: Insufficient documentation

## 2014-07-28 DIAGNOSIS — Z880 Allergy status to penicillin: Secondary | ICD-10-CM | POA: Insufficient documentation

## 2014-07-28 DIAGNOSIS — Y939 Activity, unspecified: Secondary | ICD-10-CM | POA: Diagnosis not present

## 2014-07-28 DIAGNOSIS — Y999 Unspecified external cause status: Secondary | ICD-10-CM | POA: Diagnosis not present

## 2014-07-28 DIAGNOSIS — Z862 Personal history of diseases of the blood and blood-forming organs and certain disorders involving the immune mechanism: Secondary | ICD-10-CM | POA: Diagnosis not present

## 2014-07-28 DIAGNOSIS — R001 Bradycardia, unspecified: Secondary | ICD-10-CM | POA: Diagnosis not present

## 2014-07-28 DIAGNOSIS — R110 Nausea: Secondary | ICD-10-CM | POA: Insufficient documentation

## 2014-07-28 DIAGNOSIS — K219 Gastro-esophageal reflux disease without esophagitis: Secondary | ICD-10-CM | POA: Diagnosis not present

## 2014-07-28 DIAGNOSIS — M545 Low back pain, unspecified: Secondary | ICD-10-CM

## 2014-07-28 DIAGNOSIS — E119 Type 2 diabetes mellitus without complications: Secondary | ICD-10-CM | POA: Diagnosis not present

## 2014-07-28 DIAGNOSIS — E039 Hypothyroidism, unspecified: Secondary | ICD-10-CM | POA: Diagnosis not present

## 2014-07-28 DIAGNOSIS — S299XXA Unspecified injury of thorax, initial encounter: Secondary | ICD-10-CM | POA: Insufficient documentation

## 2014-07-28 DIAGNOSIS — R296 Repeated falls: Secondary | ICD-10-CM

## 2014-07-28 MED ORDER — KETOROLAC TROMETHAMINE 30 MG/ML IJ SOLN: 30.0000 mg | Freq: Once | INTRAMUSCULAR | Status: DC

## 2014-07-28 MED ORDER — KETOROLAC TROMETHAMINE 30 MG/ML IJ SOLN
30.0000 mg | Freq: Once | INTRAMUSCULAR | Status: AC
  Administered 2014-07-28: 30 mg via INTRAVENOUS
  Filled 2014-07-28: qty 1

## 2014-07-28 NOTE — ED Notes (Signed)
Pt very sleepy during assessment with slight slurred words.  Pt states she is sleepy because "I do not sleep at night".  Pt states she took Xanax  and flexiril this morning. Pt states she tripped over door jamb when taking dogs out and fell backwards.  Pt is c/o mid and lower back pain - no injuries noted.  Pt moving without assistance in bed.

## 2014-07-28 NOTE — Discharge Instructions (Signed)
Use your home pain medications as needed for pain. Use heat to the areas of pain as needed for additional pain relief. Call your regular back specialist/pain specialist today to schedule a follow up appointment for your ongoing back pain. Follow up with your regular doctor in 1 week to discuss ongoing care of your chronic pain as well as to discuss the options for home physical therapy. Return to the ER for changes or worsening symptoms.   Back Pain, Adult Back pain is very common. The pain often gets better over time. The cause of back pain is usually not dangerous. Most people can learn to manage their back pain on their own.  HOME CARE   Stay active. Start with short walks on flat ground if you can. Try to walk farther each day.  Do not sit, drive, or stand in one place for more than 30 minutes. Do not stay in bed.  Do not avoid exercise or work. Activity can help your back heal faster.  Be careful when you bend or lift an object. Bend at your knees, keep the object close to you, and do not twist.  Sleep on a firm mattress. Lie on your side, and bend your knees. If you lie on your back, put a pillow under your knees.  Only take medicines as told by your doctor.  Put ice on the injured area.  Put ice in a plastic bag.  Place a towel between your skin and the bag.  Leave the ice on for 15-20 minutes, 03-04 times a day for the first 2 to 3 days. After that, you can switch between ice and heat packs.  Ask your doctor about back exercises or massage.  Avoid feeling anxious or stressed. Find good ways to deal with stress, such as exercise. GET HELP RIGHT AWAY IF:   Your pain does not go away with rest or medicine.  Your pain does not go away in 1 week.  You have new problems.  You do not feel well.  The pain spreads into your legs.  You cannot control when you poop (bowel movement) or pee (urinate).  Your arms or legs feel weak or lose feeling (numbness).  You feel sick to  your stomach (nauseous) or throw up (vomit).  You have belly (abdominal) pain.  You feel like you may pass out (faint). MAKE SURE YOU:   Understand these instructions.  Will watch your condition.  Will get help right away if you are not doing well or get worse. Document Released: 06/15/2007 Document Revised: 03/21/2011 Document Reviewed: 04/30/2013 [facility] Patient Information 2015 [place], [place]. This information is not intended to replace advice given to you by your health care provider. Make sure you discuss any questions you have with your health care provider.  Back Injury Prevention The following tips can help you to prevent a back injury. PHYSICAL FITNESS  Exercise often. Try to develop strong stomach (abdominal) muscles.  Do aerobic exercises often. This includes walking, jogging, biking, swimming.  Do exercises that help with balance and strength often. This includes tai chi and yoga.  Stretch before and after you exercise.  Keep a healthy weight. DIET   Ask your doctor how much calcium and vitamin D you need every day.  Include calcium in your diet. Foods high in calcium include dairy products; green, leafy vegetables; and products with calcium added (fortified).  Include vitamin D in your diet. Foods high in vitamin D include milk and products with vitamin D  added.  Think about taking a multivitamin or other nutritional products called " supplements."  Stop smoking if you smoke. POSTURE   Sit and stand up straight. Avoid leaning forward or hunching over.  Choose chairs that support your lower back.  If you work at a desk:  Sit close to your work so you do not lean over.  Keep your chin tucked in.  Keep your neck drawn back.  Keep your elbows bent at a right angle. Your arms should look like the letter "L."  Sit high and close to the steering wheel when you drive. Add low back support to your car seat if needed.  Avoid sitting or standing in one  position for too long. Get up and move around every hour. Take breaks if you are driving for a long time.  Sleep on your side with your knees slightly bent. You can also sleep on your back with a pillow under your knees. Do not sleep on your stomach. LIFTING, TWISTING, AND REACHING  Avoid heavy lifting, especially lifting over and over again. If you must do heavy lifting:  Stretch before lifting.  Work slowly.  Rest between lifts.  Use carts and dollies to move objects when possible.  Make several small trips instead of carrying 1 heavy load.  Ask for help when you need it.  Ask for help when moving big, awkward objects.  Follow these steps when lifting:  Stand with your feet shoulder-width apart.  Get as close to the object as you can. Do not pick up heavy objects that are far from your body.  Use handles or lifting straps when possible.  Bend at your knees. Squat down, but keep your heels off the floor.  Keep your shoulders back, your chin tucked in, and your back straight.  Lift the object slowly. Tighten the muscles in your legs, stomach, and butt. Keep the object as close to the center of your body as possible.  Reverse these directions when you put a load down.  Do not:  Lift the object above your waist.  Twist at the waist while lifting or carrying a load. Move your feet if you need to turn, not your waist.  Bend over without bending at your knees.  Avoid reaching over your head, across a table, or for an object on a high surface. OTHER TIPS  Avoid wet floors and keep sidewalks clear of ice.  Do not sleep on a mattress that is too soft or too hard.  Keep items that you use often within easy reach.  Put heavier objects on shelves at waist level. Put lighter objects on lower or higher shelves.  Find ways to lessen your stress. You can try exercise, massage, or relaxation.  Get help for depression or anxiety if needed. GET HELP IF:  You injure your  back.  You have questions about diet, exercise, or other ways to prevent back injuries. MAKE SURE YOU:  Understand these instructions.  Will watch your condition.  Will get help right away if you are not doing well or get worse. Document Released: 06/15/2007 Document Revised: 03/21/2011 Document Reviewed: 02/07/2011 [facility] Patient Information 2015 [place], [place]. This information is not intended to replace advice given to you by your health care provider. Make sure you discuss any questions you have with your health care provider.  Back Exercises Back exercises help treat and prevent back injuries. The goal is to increase your strength in your belly (abdominal) and back muscles. These exercises  can also help with flexibility. Start these exercises when told by your doctor. HOME CARE Back exercises include: Pelvic Tilt.  Lie on your back with your knees bent. Tilt your pelvis until the lower part of your back is against the floor. Hold this position 5 to 10 sec. Repeat this exercise 5 to 10 times. Knee to Chest.  Pull 1 knee up against your chest and hold for 20 to 30 seconds. Repeat this with the other knee. This may be done with the other leg straight or bent, whichever feels better. Then, pull both knees up against your chest. Sit-Ups or Curl-Ups.  Bend your knees 90 degrees. Start with tilting your pelvis, and do a partial, slow sit-up. Only lift your upper half 30 to 45 degrees off the floor. Take at least 2 to 3 seonds for each sit-up. Do not do sit-ups with your knees out straight. If partial sit-ups are difficult, simply do the above but with only tightening your belly (abdominal) muscles and holding it as told. Hip-Lift.  Lie on your back with your knees flexed 90 degrees. Push down with your feet and shoulders as you raise your hips 2 inches off the floor. Hold for 10 seconds, repeat 5 to 10 times. Back Arches.  Lie on your stomach. Prop yourself up on bent elbows. Slowly  press on your hands, causing an arch in your low back. Repeat 3 to 5 times. Shoulder-Lifts.  Lie face down with arms beside your body. Keep hips and belly pressed to floor as you slowly lift your head and shoulders off the floor. Do not overdo your exercises. Be careful in the beginning. Exercises may cause you some mild back discomfort. If the pain lasts for more than 15 minutes, stop the exercises until you see your doctor. Improvement with exercise for back problems is slow.  Document Released: 01/29/2010 Document Revised: 03/21/2011 Document Reviewed: 10/28/2010 [facility] Patient Information 2015 [place], [place]. This information is not intended to replace advice given to you by your health care provider. Make sure you discuss any questions you have with your health care provider.  Fall Prevention and Home Safety Falls cause injuries and can affect all age groups. It is possible to prevent falls.  HOW TO PREVENT FALLS  Wear shoes with rubber soles that do not have an opening for your toes.  Keep the inside and outside of your house well lit.  Use night lights throughout your home.  Remove clutter from floors.  Clean up floor spills.  Remove throw rugs or fasten them to the floor with carpet tape.  Do not place electrical cords across pathways.  Put grab bars by your tub, shower, and toilet. Do not use towel bars as grab bars.  Put handrails on both sides of the stairway. Fix loose handrails.  Do not climb on stools or stepladders, if possible.  Do not wax your floors.  Repair uneven or unsafe sidewalks, walkways, or stairs.  Keep items you use a lot within reach.  Be aware of pets.  Keep emergency numbers next to the telephone.  Put smoke detectors in your home and near bedrooms. Ask your doctor what other things you can do to prevent falls. Document Released: 10/23/2008 Document Revised: 06/28/2011 Document Reviewed: 03/29/2011 [facility] Patient Information 2015  [place], [place]. This information is not intended to replace advice given to you by your health care provider. Make sure you discuss any questions you have with your health care provider.  Heat Therapy Heat therapy  can help make painful, stiff muscles and joints feel better. Do not use heat on new injuries. Wait at least 48 hours after an injury to use heat. Do not use heat when you have aches or pains right after an activity. If you still have pain 3 hours after stopping the activity, then you may use heat. HOME CARE Wet heat pack  Soak a clean towel in warm water. Squeeze out the extra water.  Put the warm, wet towel in a plastic bag.  Place a thin, dry towel between your skin and the bag.  Put the heat pack on the area for 5 minutes, and check your skin. Your skin may be pink, but it should not be red.  Leave the heat pack on the area for 15 to 30 minutes.  Repeat this every 2 to 4 hours while awake. Do not use heat while you are sleeping. Warm water bath  Fill a tub with warm water.  Place the affected body part in the tub.  Soak the area for 20 to 40 minutes.  Repeat as needed. Hot water bottle  Fill the water bottle half full with hot water.  Press out the extra air. Close the cap tightly.  Place a dry towel between your skin and the bottle.  Put the bottle on the area for 5 minutes, and check your skin. Your skin may be pink, but it should not be red.  Leave the bottle on the area for 15 to 30 minutes.  Repeat this every 2 to 4 hours while awake. Electric heating pad  Place a dry towel between your skin and the heating pad.  Set the heating pad on low heat.  Put the heating pad on the area for 10 minutes, and check your skin. Your skin may be pink, but it should not be red.  Leave the heating pad on the area for 20 to 40 minutes.  Repeat this every 2 to 4 hours while awake.  Do not lie on the heating pad.  Do not fall asleep while using the heating  pad.  Do not use the heating pad near water. GET HELP RIGHT AWAY IF:  You get blisters or red skin.  Your skin is puffy (swollen), or you lose feeling (numbness) in the affected area.  You have any new problems.  Your problems are getting worse.  You have any questions or concerns. If you have any problems, stop using heat therapy until you see your doctor. MAKE SURE YOU:  Understand these instructions.  Will watch your condition.  Will get help right away if you are not doing well or get worse. Document Released: 03/21/2011 Document Reviewed: 02/19/2013 [facility] Patient Information 2015 [place]. This information is not intended to replace advice given to you by your health care provider. Make sure you discuss any questions you have with your health care provider.

## 2014-07-28 NOTE — ED Notes (Signed)
Called pt's son Ronaldo MiyamotoKyle and left him a msg.  Notified him that pt is d/c home and needs to be picked up.

## 2014-07-28 NOTE — ED Notes (Signed)
Bed: WA08 Expected date:  Expected time:  Means of arrival:  Comments: fall 

## 2014-07-28 NOTE — ED Provider Notes (Signed)
CSN: [id number]     [id number] date & time 07/28/14  1130 History   First MD Initiated Contact with Patient 07/28/14 1151     Chief Complaint  Patient presents with  . Fall    Fall approx 40min PTA.       (Consider location/radiation/quality/duration/timing/severity/associated sxs/prior Treatment) HPI Comments: Valerie Silva is a 54 y.o. female with a PMHx of recurrent falls, osteoporosis, hypothyroidism, DM2, exogenous obesity, arthritis, fibromyalgia, somnolence, sleep apnea, iron deficiency/pernicious anemia, anxiety, depression, GERD, gastrointestinal bleeding, hx of gastric bypass, chronic migraines, and PSHx of spinal fusion as well as multiple other surgical procedures as listed below, who presents to the ED with complaints of mechanical fall after she tripped over her dorsum while taking her dogs out, landing on her buttocks around 10:30 AM. She denies head injury or loss of consciousness. She endorses 10/10 thoracic constant sharp nonradiating pain worse with movement with no treatments tried prior to arrival. She has recurrent falls, and was seen by for neurology one month ago and is scheduled to have PT has an outpatient. She was also started on metoprolol last month for migraine prophylaxis, which she takes regularly. She states that around 9 AM she took 2 mg of Xanax and the Flexeril because she did not sleep last night and wanted to try to sleep. She denies feeling any lightheadedness or dizziness prior to fall. Feels some nausea now due to pain. Denies any headache, vision changes, dizziness, lightheadedness, fevers, chills, chest pain, shortness breath, abdominal pain, vomiting, diarrhea, constipation, melena, hematochezia, dysuria, hematuria, vaginal bleeding or discharge, numbness, tingling, weakness, cauda equina symptoms, incontinence of urine or stool, history of IV drug use, or history of cancer.  Patient is a 54 y.o. female presenting with fall. The history is provided by the  patient. No language interpreter was used.  Fall This is a recurrent problem. The current episode started today. The problem occurs every several days. The problem has been unchanged. Associated symptoms include nausea (mild). Pertinent negatives include no abdominal pain, arthralgias, chest pain, chills, fever, headaches, myalgias, neck pain, numbness, urinary symptoms, visual change, vomiting or weakness. The symptoms are aggravated by walking. She has tried nothing for the symptoms. The treatment provided no relief.    Past Medical History  Diagnosis Date  . Osteoporosis   . Hypothyroidism   . Diabetes mellitus   . Exogenous obesity   . Rhinitis   . Arthritis   . Fibromyalgia   . Somnolence   . Sleep apnea   . Anemia, iron deficiency 04/06/2011  . Pernicious anemia 04/06/2011  . Depression   . Anxiety   . Iron deficiency anemia, unspecified 12/12/2012  . Iron deficiency anemia due to chronic blood loss 01/15/2014  . GERD (gastroesophageal reflux disease)   . GIB (gastrointestinal bleeding)   . H/O gastric bypass   . Common migraine with intractable migraine 06/12/2014  . Gait disorder 06/12/2014   Past Surgical History  Procedure Laterality Date  . Gastric bypass    . Cholecystectomy    . Tracheostomy    . Uvulectomy    . Tonsillectomy    . Mandible reconstruction    . Spinal fusion    . Dilation and curettage of uterus    . Cesarean section     Family History  Problem Relation Age of Onset  . CAD Father   . Diabetes Father   . CAD Brother      X3  . Hypertension Brother   . Colon  cancer Brother    History  Substance Use Topics  . Smoking status: Never Smoker   . Smokeless tobacco: Never Used     Comment: never used tobacco  . Alcohol Use: No     Comment: one mixed drink twice yearly   OB History    No data available     Review of Systems  Constitutional: Negative for fever and chills.  HENT: Negative for facial swelling (no head injury).   Eyes: Negative for  visual disturbance.  Respiratory: Negative for shortness of breath.   Cardiovascular: Negative for chest pain.  Gastrointestinal: Positive for nausea (mild). Negative for vomiting, abdominal pain, diarrhea, constipation and blood in stool.  Genitourinary: Negative for dysuria, hematuria, vaginal bleeding, vaginal discharge and difficulty urinating (no incontinence).  Musculoskeletal: Positive for back pain. Negative for myalgias, arthralgias and neck pain.  Skin: Negative for color change and wound.  Allergic/Immunologic: Positive for immunocompromised state (diabetic).  Neurological: Negative for dizziness, syncope, weakness, light-headedness, numbness and headaches.  Psychiatric/Behavioral: Negative for confusion.   10 Systems reviewed and are negative for acute change except as noted in the HPI.    Allergies  Ambien; Benazepril hcl; Codeine; Iodine; Iron; Lovaza; Soma; Sulfonamide derivatives; Lyrica; Penicillins; Sulfa antibiotics; and Amitriptyline  Home Medications   Prior to Admission medications   Medication Sig Start Date End Date Taking? Authorizing Provider  acarbose (PRECOSE) 25 MG tablet Take 25 mg by mouth daily as needed (for low blood sugar).  10/09/12   Historical Provider, MD  alprazolam Prudy Feeler) 2 MG tablet Take two tablets by mouth twice daily as needed for anxiety; Take two tablets by mouth at bedtime as needed for sleep or anxiety 06/11/14   Josph Macho, MD  baclofen (LIORESAL) 10 MG tablet Take 5 mg by mouth every 4 (four) hours as needed for muscle spasms.    Historical Provider, MD  Cholecalciferol (VITAMIN D-3) 5000 UNITS TABS Take 2 tablets by mouth daily.    Historical Provider, MD  Cyanocobalamin (VITAMIN B-12 IJ) Inject as directed every 28 (twenty-eight) days.    Historical Provider, MD  cyclobenzaprine (FLEXERIL) 10 MG tablet Take 1 tablet (10 mg total) by mouth 3 (three) times daily. 11/03/13   Renae Fickle, MD  diclofenac (VOLTAREN) 75 MG EC tablet Take  75 mg by mouth 2 (two) times daily.    Historical Provider, MD  docusate sodium 100 MG CAPS Take 100 mg by mouth 2 (two) times daily. Patient taking differently: Take 100 mg by mouth 3 (three) times a week.  11/03/13   Renae Fickle, MD  furosemide (LASIX) 20 MG tablet Take 20 mg by mouth daily at 12 noon. 03/24/14   Historical Provider, MD  ibuprofen (ADVIL,MOTRIN) 800 MG tablet  04/22/14   Historical Provider, MD  KLOR-CON M20 20 MEQ tablet Take 1 tablet by mouth daily. 03/24/14   Historical Provider, MD  levothyroxine (SYNTHROID, LEVOTHROID) 300 MCG tablet Take 300 mcg by mouth daily before breakfast.    Historical Provider, MD  loratadine (CLARITIN) 10 MG tablet Take 1 tablet (10 mg total) by mouth 2 (two) times daily. Patient taking differently: Take 10 mg by mouth daily as needed for allergies.  07/13/12   Thermon Leyland, NP  Magnesium 250 MG TABS Take 1 tablet by mouth daily.    Historical Provider, MD  modafinil (PROVIGIL) 200 MG tablet Take 2 tablets (400 mg total) by mouth every morning. 11/05/13   Mahima Glade Lloyd, MD  mometasone (NASONEX) 50 MCG/ACT nasal spray  Place 2 sprays into the nose daily.    Historical Provider, MD  omeprazole (PRILOSEC) 40 MG capsule Take 1 capsule (40 mg total) by mouth daily. 07/13/12   Thermon Leyland, NP  Oxycodone HCl 10 MG TABS Take 10 mg by mouth every 4 (four) hours as needed.     Historical Provider, MD  oxymorphone (OPANA ER) 30 MG 12 hr tablet Take 1 tablet (30 mg total) by mouth every 12 (twelve) hours. 05/15/14   Standley Brooking, MD  PARoxetine (PAXIL) 20 MG tablet Take 20 mg by mouth 2 (two) times daily. 03/24/14   Historical Provider, MD  propranolol (INDERAL) 10 MG tablet One tablet twice a day for 2 weeks, then take 2 tablets twice a day 06/12/14   York Spaniel, MD  SUMAtriptan (IMITREX) 100 MG tablet Take 100 mg by mouth every 2 (two) hours as needed for migraine.  05/01/12   Historical Provider, MD  topiramate (TOPAMAX) 200 MG tablet Take 1 tablet (200  mg total) by mouth at bedtime. 07/13/12   Thermon Leyland, NP  valACYclovir (VALTREX) 1000 MG tablet Take 1,000 mg by mouth daily as needed (for flare ups).  08/16/11   Historical Provider, MD   BP 118/70 mmHg  Pulse 55  Temp(Src) 97.9 F (36.6 C) (Oral)  Resp 17  SpO2 100% Physical Exam  Constitutional: She is oriented to person, place, and time. Vital signs are normal. She appears well-developed and well-nourished.  Non-toxic appearance. No distress.  Afebrile, nontoxic, NAD  HENT:  Head: Normocephalic and atraumatic.  Mouth/Throat: Oropharynx is clear and moist and mucous membranes are normal.  Eyes: Conjunctivae and EOM are normal. Right eye exhibits no discharge. Left eye exhibits no discharge.  Neck: Normal range of motion. Neck supple. No spinous process tenderness and no muscular tenderness present. No rigidity. Normal range of motion present.  FROM intact without spinous process TTP, no bony stepoffs or deformities, no paraspinous muscle TTP or muscle spasms. No rigidity or meningeal signs. No bruising or swelling.   Cardiovascular: Regular rhythm, normal heart sounds and intact distal pulses.  Bradycardia present.  Exam reveals no gallop and no friction rub.   No murmur heard. Mild bradycardia, HR 55-60 during exam  Pulmonary/Chest: Effort normal and breath sounds normal. No respiratory distress. She has no decreased breath sounds. She has no wheezes. She has no rhonchi. She has no rales.  Abdominal: Soft. Normal appearance and bowel sounds are normal. She exhibits no distension. There is no tenderness. There is no rigidity, no rebound and no guarding.  Musculoskeletal: Normal range of motion.       Thoracic back: She exhibits tenderness and bony tenderness. She exhibits normal range of motion, no deformity and no spasm.       Lumbar back: She exhibits tenderness and bony tenderness. She exhibits normal range of motion, no deformity and no spasm.       Back:  MAE x4 Strength and  sensation grossly intact Distal pulses intact Thoracic and lumbar spine with FROM intact with diffuse midline spinous process TTP, no bony stepoffs or deformities, mild b/l lumbar paraspinous muscle TTP without muscle spasms. Strength 5/5 in all extremities, sensation grossly intact in all extremities. No overlying skin changes.   Neurological: She is alert and oriented to person, place, and time. She has normal strength. No sensory deficit. GCS eye subscore is 4. GCS verbal subscore is 5. GCS motor subscore is 6.  Speech somewhat slurred but goal-oriented A&O x4  GCS 15 Sensation and strength intact  Skin: Skin is warm, dry and intact. No rash noted.  Psychiatric: She has a normal mood and affect.  Nursing note and vitals reviewed.   ED Course  Procedures (including critical care time) Labs Review Labs Reviewed - No data to display  Imaging Review Dg Thoracic Spine W/swimmers  07/28/2014   CLINICAL DATA:  Tripped over stoop while walking dogs, falling backwards with back pain.  EXAM: THORACIC SPINE - 2 VIEW + SWIMMERS  COMPARISON:  05/13/2014  FINDINGS: Mild curvature convex to the right. Old vertebral augmentation at L1. Old cervical fusion. Chronic degenerative changes throughout the thoracic region without evidence of acute fracture.  IMPRESSION: No acute finding. Chronic degenerative changes in the thoracic region.   Electronically Signed   By: Paulina Fusi M.D.   On: 07/28/2014 13:15   Dg Lumbar Spine Complete  07/28/2014   CLINICAL DATA:  Pt very sleepy during assessment with slight slurred words. Pt states she is sleepy because "I do not sleep at night". Pt states she took Xanax 2mg  and flexiril this morning. Pt states she tripped over door jamb when taking dogs out and fell backwards today. Pt is c/o mid and lower back pain - no injuries noted. Pt unable to lay on left side, right laterals obtained.  EXAM: LUMBAR SPINE - COMPLETE 4+ VIEW  COMPARISON:  05/13/2014  FINDINGS: Compression  fractures of L1, L2 and L4 have been treated with vertebroplasty. The treated L4 fracture is new from the prior study. The L1-L2 changes are stable.  No other fractures. Mild to moderate loss of disc height at L1-L2 and L2-L3. Moderate loss disc height at L3-L4. Mild loss disc height at L4-L5-L5-S1. Small endplate osteophytes throughout the visualized spine.  Bones are diffusely demineralized.  Multiple surgical vascular clips are noted in the abdomen, stable the prior study.  IMPRESSION: 1. No acute fracture. 2. Compression fractures of L1, L2 and L4 treated with vertebroplasty. L4 treated fracture is new from the prior study. No other change.   Electronically Signed   By: Amie Portland M.D.   On: 07/28/2014 13:16     EKG Interpretation None      MDM   Final diagnoses:  Fall  Midline thoracic back pain  Midline low back pain without sciatica  Chronic pain  Recurrent falls    54 y.o. female here with mechanical fall PTA, complaining of thoracic/lumbar pain. States she didn't sleep well last night, took xanax 1hr prior to fall, slurred words but A&O x4. MAE x4, strength and sensation grossly intact, distal pulses intact, all extremities neurovascularly intact. Tenderness to midline thoracic and lumbar spine. No bruising or abrasions. No head inj or LOC, no dizziness/lightheadedness/HA. Doubt need for labs, will get xrays. Pt has been evaluated by neuro last month for her recurrent falls, recommended outpt PT. Also recommended metoprolol for HA prophylaxis, pt taking this which is likely contributing to her mild bradycardia. Will give toradol now and reassess. Want to avoid narcotics given her chronic narcotics and her borderline somnolence. Will reassess shortly.   2:51 PM Xrays without acute fracture, evidence of prior treated compression fx of lumbar spine found. Pt ambulatory here. Discussed use of cane at home (pt has this) and f/up with her back specialist/pain specialist for ongoing  management of her back pain. Discussed use of home pain meds (pt with opana and flexeril, xanax, etc). Discussed that these could be contributing to her recurrent falls but since pt is  ambulatory without assistance here, doubt need for admission today, but advised pt to be aware that med overuse/misuse can cause respiratory depression and falls. Pt lives at home with sons who are able to assist her if needed, and again she is ambulatory and able to get herself out of bed and onto bedside commode. Discussed use of tylenol/motrin/home pain meds and heat. Will have her f/up with her regular doctors. I explained the diagnosis and have given explicit precautions to return to the ER including for any other new or worsening symptoms. The patient understands and accepts the medical plan as it's been dictated and I have answered their questions. Discharge instructions concerning home care and prescriptions have been given. The patient is STABLE and is discharged to home in good condition.   BP 128/78 mmHg  Pulse 50  Temp(Src) 97.9 F (36.6 C) (Oral)  Resp 18  SpO2 100%  Meds ordered this encounter  Medications  . ketorolac (TORADOL) 30 MG/ML injection 30 mg    Sig:      [place] Virrueta Camprubi-Soms, PA-C 07/28/14 1454  Toy Cookey, MD 07/28/14 1556

## 2014-07-28 NOTE — ED Notes (Signed)
Patient transported to X-ray 

## 2014-07-28 NOTE — ED Notes (Addendum)
Pt transported by EMS for fall approx 40 min ago.  Pt c/o pain to entire back - no injuries noted.  EMS reports patient may have taken more xanax than prescribed - pt states she took  of xanax at approx 0900.  Pt sleepy en route to ER.  Pt denies LOC, hitting head.

## 2014-07-28 NOTE — ED Notes (Signed)
Pt placed on bedside commode. Unsteady upon standing to ambulate to RR

## 2014-08-07 ENCOUNTER — Other Ambulatory Visit: Payer: Medicare Other

## 2014-08-07 ENCOUNTER — Ambulatory Visit: Payer: Medicare Other

## 2014-08-07 ENCOUNTER — Ambulatory Visit: Payer: Medicare Other | Admitting: Family

## 2014-08-12 ENCOUNTER — Ambulatory Visit: Payer: Medicare Other | Admitting: Hematology & Oncology

## 2014-08-12 ENCOUNTER — Other Ambulatory Visit: Payer: Medicare Other

## 2014-08-12 ENCOUNTER — Ambulatory Visit: Payer: Medicare Other

## 2014-08-15 ENCOUNTER — Ambulatory Visit: Payer: Medicare Other | Admitting: Hematology & Oncology

## 2014-08-15 ENCOUNTER — Other Ambulatory Visit: Payer: Medicare Other

## 2014-08-15 ENCOUNTER — Ambulatory Visit: Payer: Medicare Other

## 2014-09-16 ENCOUNTER — Ambulatory Visit: Payer: Medicare Other | Admitting: Nurse Practitioner

## 2014-09-17 ENCOUNTER — Telehealth: Payer: Self-pay | Admitting: Neurology

## 2014-09-17 ENCOUNTER — Encounter: Payer: Self-pay | Admitting: Nurse Practitioner

## 2014-09-17 MED ORDER — TIZANIDINE HCL 2 MG PO TABS: 2.0000 mg | ORAL_TABLET | Freq: Three times a day (TID) | ORAL | Status: DC

## 2014-09-17 NOTE — Telephone Encounter (Signed)
Pt called and states she went to PCP and while there told her to stop taking propranolol (INDERAL) 10 MG tablet. The pt states she fell around Aug 29th in her drive way. She states she had a few other black outs and had been standing and walking around for a while. She fell on her face. Pt explains her black outs start with tunnel vision. She seems to think that they are caused by her blood sugar levels.  Please call and advise [phone], Niece's house.

## 2014-09-17 NOTE — Telephone Encounter (Signed)
I called patient. The patient was having some blackouts on propranolol. She has had documented low blood pressure. She has stopped the medication, I will try tizanidine instead. She remains on Topamax.

## 2014-10-14 ENCOUNTER — Ambulatory Visit: Payer: Medicare Other | Admitting: Nurse Practitioner

## 2014-10-16 ENCOUNTER — Ambulatory Visit (HOSPITAL_BASED_OUTPATIENT_CLINIC_OR_DEPARTMENT_OTHER): Payer: Medicare Other

## 2014-10-16 ENCOUNTER — Encounter: Payer: Self-pay | Admitting: Hematology & Oncology

## 2014-10-16 ENCOUNTER — Ambulatory Visit (HOSPITAL_BASED_OUTPATIENT_CLINIC_OR_DEPARTMENT_OTHER): Payer: Medicare Other | Admitting: Hematology & Oncology

## 2014-10-16 ENCOUNTER — Other Ambulatory Visit (HOSPITAL_BASED_OUTPATIENT_CLINIC_OR_DEPARTMENT_OTHER): Payer: Medicare Other

## 2014-10-16 VITALS — BP 115/61 | HR 78 | Temp 97.7°F | Resp 16 | Ht 67.0 in | Wt 189.0 lb

## 2014-10-16 DIAGNOSIS — D510 Vitamin B12 deficiency anemia due to intrinsic factor deficiency: Secondary | ICD-10-CM

## 2014-10-16 DIAGNOSIS — Z23 Encounter for immunization: Secondary | ICD-10-CM | POA: Diagnosis not present

## 2014-10-16 DIAGNOSIS — F411 Generalized anxiety disorder: Secondary | ICD-10-CM | POA: Diagnosis not present

## 2014-10-16 DIAGNOSIS — E559 Vitamin D deficiency, unspecified: Secondary | ICD-10-CM

## 2014-10-16 DIAGNOSIS — D500 Iron deficiency anemia secondary to blood loss (chronic): Secondary | ICD-10-CM

## 2014-10-16 DIAGNOSIS — D509 Iron deficiency anemia, unspecified: Secondary | ICD-10-CM

## 2014-10-16 DIAGNOSIS — Z9884 Bariatric surgery status: Secondary | ICD-10-CM

## 2014-10-16 DIAGNOSIS — E538 Deficiency of other specified B group vitamins: Secondary | ICD-10-CM

## 2014-10-16 LAB — CMP (CANCER CENTER ONLY)
ALBUMIN: 3.5 g/dL (ref 3.3–5.5)
ALT(SGPT): 42 U/L (ref 10–47)
AST: 36 U/L (ref 11–38)
Alkaline Phosphatase: 68 U/L (ref 26–84)
BUN, Bld: 22 mg/dL (ref 7–22)
CHLORIDE: 103 meq/L (ref 98–108)
CO2: 22 meq/L (ref 18–33)
Calcium: 8.8 mg/dL (ref 8.0–10.3)
Creat: 0.9 mg/dl (ref 0.6–1.2)
Glucose, Bld: 82 mg/dL (ref 73–118)
Potassium: 4.1 mEq/L (ref 3.3–4.7)
Sodium: 135 mEq/L (ref 128–145)
Total Bilirubin: 0.4 mg/dl (ref 0.20–1.60)
Total Protein: 6.7 g/dL (ref 6.4–8.1)

## 2014-10-16 LAB — CBC WITH DIFFERENTIAL (CANCER CENTER ONLY)
BASO#: 0 10*3/uL (ref 0.0–0.2)
BASO%: 0.4 % (ref 0.0–2.0)
EOS%: 2.9 % (ref 0.0–7.0)
Eosinophils Absolute: 0.1 10*3/uL (ref 0.0–0.5)
HEMATOCRIT: 32.6 % — AB (ref 34.8–46.6)
HGB: 10.5 g/dL — ABNORMAL LOW (ref 11.6–15.9)
LYMPH#: 1.3 10*3/uL (ref 0.9–3.3)
LYMPH%: 26.2 % (ref 14.0–48.0)
MCH: 31.8 pg (ref 26.0–34.0)
MCHC: 32.2 g/dL (ref 32.0–36.0)
MCV: 99 fL (ref 81–101)
MONO#: 0.3 10*3/uL (ref 0.1–0.9)
MONO%: 5.9 % (ref 0.0–13.0)
NEUT#: 3.1 10*3/uL (ref 1.5–6.5)
NEUT%: 64.6 % (ref 39.6–80.0)
Platelets: 216 10*3/uL (ref 145–400)
RBC: 3.3 10*6/uL — ABNORMAL LOW (ref 3.70–5.32)
RDW: 13.3 % (ref 11.1–15.7)
WBC: 4.8 10*3/uL (ref 3.9–10.0)

## 2014-10-16 LAB — VITAMIN B12: VITAMIN B 12: 302 pg/mL (ref 211–911)

## 2014-10-16 MED ORDER — METHYLPREDNISOLONE SODIUM SUCC 40 MG IJ SOLR
20.0000 mg | Freq: Once | INTRAMUSCULAR | Status: AC
  Administered 2014-10-16: 20 mg via INTRAVENOUS

## 2014-10-16 MED ORDER — DIPHENHYDRAMINE HCL 50 MG/ML IJ SOLN
INTRAMUSCULAR | Status: AC
  Filled 2014-10-16: qty 1

## 2014-10-16 MED ORDER — FERUMOXYTOL INJECTION 510 MG/17 ML
510.0000 mg | Freq: Once | INTRAVENOUS | Status: AC
  Administered 2014-10-16: 510 mg via INTRAVENOUS
  Filled 2014-10-16: qty 17

## 2014-10-16 MED ORDER — METHYLPREDNISOLONE SODIUM SUCC 40 MG IJ SOLR
INTRAMUSCULAR | Status: AC
  Filled 2014-10-16: qty 1

## 2014-10-16 MED ORDER — DIPHENHYDRAMINE HCL 50 MG/ML IJ SOLN
25.0000 mg | Freq: Once | INTRAMUSCULAR | Status: AC
  Administered 2014-10-16: 25 mg via INTRAVENOUS

## 2014-10-16 MED ORDER — INFLUENZA VAC SPLIT QUAD 0.5 ML IM SUSY
0.5000 mL | PREFILLED_SYRINGE | Freq: Once | INTRAMUSCULAR | Status: AC
  Administered 2014-10-16: 0.5 mL via INTRAMUSCULAR
  Filled 2014-10-16: qty 0.5

## 2014-10-16 NOTE — Progress Notes (Signed)
Hematology and Oncology Follow Up Visit  Valerie Silva [id number] 12-10-1960 54 y.o. 10/16/2014   Principle Diagnosis:   ntermittent iron deficiency anemia  Vitamin B12 deficiency  Gastric bypass  Severe anxiety  Current Therapy:    IV iron as indicated  Vitamin B12 1 mg IM q. 3 months     Interim History:  [name] is back for followup. As always, there is a lot of drama in her life. Her ex-husband died. As always, there are issues with respect to his will. Apparently, her psychotic son has gotten the money. This will probably last less than a year with him with his drug use.  Thank you, [name] is living with her niece. Her niece is very nice and really is doing good job trying to help [name].  [name] has had some falling episodes.  She's had no obvious bleeding.  She does feel tired. It has been quite a loss as we gave her any iron.  She has had no infections. She has had no cough. She has had some leg swelling which is chronic.  She has had no change in her medications.  Overall, her performance status is ECOG 2-3.     Medications:  Current outpatient prescriptions:  .  acarbose (PRECOSE) 25 MG tablet, Take 25 mg by mouth daily as needed (for low blood sugar). , Disp: , Rfl:  .  alprazolam (XANAX) 2 MG tablet, Take two tablets by mouth twice daily as needed for anxiety; Take two tablets by mouth at bedtime as needed for sleep or anxiety (Patient taking differently: Take 2 mg by mouth at bedtime. Take two tablets by mouth twice daily as needed for anxiety; Take two tablets by mouth at bedtime as needed for sleep or anxiety), Disp: 180 tablet, Rfl: 5 .  Cholecalciferol (VITAMIN D-3) 5000 UNITS TABS, Take 2 tablets by mouth daily., Disp: , Rfl:  .  Cyanocobalamin (VITAMIN B-12 IJ), Inject as directed every 28 (twenty-eight) days., Disp: , Rfl:  .  diclofenac (VOLTAREN) 75 MG EC tablet, Take 75 mg by mouth 2 (two) times daily., Disp: , Rfl:  .  docusate  sodium 100 MG CAPS, Take 100 mg by mouth 2 (two) times daily. (Patient taking differently: Take 100 mg by mouth 3 (three) times a week. ), Disp: 10 capsule, Rfl: 0 .  furosemide (LASIX) 20 MG tablet, Take 20 mg by mouth daily at 12 noon., Disp: , Rfl:  .  ibuprofen (ADVIL,MOTRIN) 800 MG tablet, , Disp: , Rfl:  .  KLOR-CON M20 20 MEQ tablet, Take 1 tablet by mouth daily., Disp: , Rfl:  .  levothyroxine (SYNTHROID, LEVOTHROID) 125 MCG tablet, , Disp: , Rfl:  .  loratadine (CLARITIN) 10 MG tablet, Take 1 tablet (10 mg total) by mouth 2 (two) times daily. (Patient taking differently: Take 10 mg by mouth daily as needed for allergies. ), Disp: , Rfl:  .  Magnesium 250 MG TABS, Take 1 tablet by mouth daily., Disp: , Rfl:  .  modafinil (PROVIGIL) 200 MG tablet, Take 2 tablets (400 mg total) by mouth every morning., Disp: 60 tablet, Rfl: 5 .  mometasone (NASONEX) 50 MCG/ACT nasal spray, Place 2 sprays into the nose daily., Disp: , Rfl:  .  omeprazole (PRILOSEC) 40 MG capsule, Take 1 capsule (40 mg total) by mouth daily., Disp: 30 capsule, Rfl: 1 .  OPANA ER, CRUSH RESISTANT, 30 MG T12A, , Disp: , Rfl:  .  Oxycodone HCl 10  MG TABS, Take 10 mg by mouth every 4 (four) hours as needed. , Disp: , Rfl:  .  oxymorphone (OPANA ER) 30 MG 12 hr tablet, Take 1 tablet (30 mg total) by mouth every 12 (twelve) hours., Disp: 10 tablet, Rfl: 0 .  PARoxetine (PAXIL) 20 MG tablet, Take 20 mg by mouth 2 (two) times daily., Disp: , Rfl:  .  promethazine (PHENERGAN) 25 MG tablet, Take 1 tablet by mouth at bedtime., Disp: , Rfl:  .  SUMAtriptan (IMITREX) 100 MG tablet, Take 100 mg by mouth every 2 (two) hours as needed for migraine. , Disp: , Rfl:  .  tiZANidine (ZANAFLEX) 2 MG tablet, Take 1 tablet (2 mg total) by mouth 3 (three) times daily., Disp: 90 tablet, Rfl: 1 .  topiramate (TOPAMAX) 200 MG tablet, Take 1 tablet (200 mg total) by mouth at bedtime., Disp: 30 tablet, Rfl: 0 .  valACYclovir (VALTREX) 1000 MG tablet, Take  1,000 mg by mouth daily as needed (for flare ups). , Disp: , Rfl:   Current facility-administered medications:  .  Influenza vac split quadrivalent PF (FLUARIX) injection 0.5 mL, 0.5 mL, Intramuscular, Once, Josph Macho, MD  Facility-Administered Medications Ordered in Other Visits:  .  ferumoxytol (FERAHEME) 510 mg in sodium chloride 0.9 % 100 mL IVPB, 510 mg, Intravenous, Once, Josph Macho, MD  Allergies:  Allergies  Allergen Reactions  . Ambien [Zolpidem Tartrate] Itching and Other (See Comments)    Pt states that this medication causes blackouts.   . Benazepril Hcl Other (See Comments)    Severe cough   . Codeine Other (See Comments)    Pt states that this medication causes rapid heartbeat.   . Iodine Other (See Comments)    Sensitive when used gynecologically (intra-vaginally).  . Iron Itching    Iron taken by mouth causes this reaction. The patient has had several iron infusions plus Feraheme that has not caused any side effects.   . Lovaza [Omega-3-Acid Ethyl Esters] Itching  . Soma [Carisoprodol] Other (See Comments)    Pt states that this medication causes panic attacks.   . Sulfonamide Derivatives Other (See Comments)    Sensitive when used intra-vaginally.  Roselee Nova [Pregabalin]     Pt can not remember  . Penicillins Other (See Comments)    Severe family allergy.  Family physician long ago stated she shouldn't take this med since mother and brother nearly died from anaphylaxis.  . Sulfa Antibiotics Other (See Comments)    Pt unable to recall reaction - occurred as a child.  . Amitriptyline Other (See Comments)    Pt states that this medication causes sleep walking.     Past Medical History, Surgical history, Social history, and Family History were reviewed and updated.  Review of Systems: As above  Physical Exam:  height is  (1.702 m) and weight is 189 lb (85.73 kg). Her oral temperature is 97.7 F (36.5 C). Her blood pressure is 115/61 and her pulse  is 78. Her respiration is 16.   Obese white female. Head and neck exam shows no ocular or oral lesions. She has no palpable lymph nodes in her neck. Thyroid is not palpable. Lungs are clear. Cardiac exam shows a regular rate and rhythm with no murmurs, rubs or bruits.. Abdomen is soft. Has good bowel sounds. She has well-healed laparotomy scars. Back exam shows no tenderness over the spine. She does have healed laminectomy scars. Extremities shows some chronic trace edema in her legs.  Skin exam shows no rashes, ecchymoses or petechia. Neurological exam is nonfocal.   Lab Results  Component Value Date   WBC 4.8 10/16/2014   HGB 10.5* 10/16/2014   HCT 32.6* 10/16/2014   MCV 99 10/16/2014   PLT 216 10/16/2014     Chemistry      Component Value Date/Time   NA 135 10/16/2014 1318   NA 134* 05/14/2014 0508   NA 136 03/06/2013 1619   K 4.1 10/16/2014 1318   K 4.2 05/14/2014 0508   K 4.4 03/06/2013 1619   CL 103 10/16/2014 1318   CL 102 05/14/2014 0508   CO2 22 10/16/2014 1318   CO2 25 05/14/2014 0508   CO2 23 03/06/2013 1619   BUN 22 10/16/2014 1318   BUN 14 05/14/2014 0508   BUN 27.1* 03/06/2013 1619   CREATININE 0.9 10/16/2014 1318   CREATININE 0.64 05/14/2014 0508   CREATININE 1.0 03/06/2013 1619      Component Value Date/Time   CALCIUM 8.8 10/16/2014 1318   CALCIUM 8.3* 05/14/2014 0508   CALCIUM 8.8 03/06/2013 1619   ALKPHOS 68 10/16/2014 1318   ALKPHOS 67 05/14/2014 0508   ALKPHOS 105 03/06/2013 1619   AST 36 10/16/2014 1318   AST 25 05/14/2014 0508   AST 44* 03/06/2013 1619   ALT 42 10/16/2014 1318   ALT 23 05/14/2014 0508   ALT 36 03/06/2013 1619   BILITOT 0.40 10/16/2014 1318   BILITOT 0.5 05/14/2014 0508   BILITOT 0.50 03/06/2013 1619         Impression and Plan: Ms. Dempsey is a 53 year old female. She has a history gastric bypass. She is iron deficient and B12 deficient.  I will go ahead and give her iron today. I am confident that she will need  iron.  She also needs vitamin B 12. No surprise, there now is a new role that says we cannot give iron and vitamin B-12 the same day. I'm not sure as to why we cannot do this. There is no physiologic reason that we cannot give the 2 together. It clearly will make it harder for the patient as she has to come back for another visit for another injection.  Thankfully, her niece really is helping out as much as she can. This is providing some stability for Ms. Haas.  I will plan to get her back to see [place] in another 3-4 months.   Josph Macho, MD 10/6/20163:10 PM

## 2014-10-16 NOTE — Patient Instructions (Signed)

## 2014-10-17 LAB — IRON AND TIBC CHCC
%SAT: 39 % (ref 21–57)
Iron: 94 ug/dL (ref 41–142)
TIBC: 241 ug/dL (ref 236–444)
UIBC: 147 ug/dL (ref 120–384)

## 2014-10-17 LAB — FERRITIN CHCC: Ferritin: 525 ng/ml — ABNORMAL HIGH (ref 9–269)

## 2014-11-03 ENCOUNTER — Ambulatory Visit: Payer: Medicare Other

## 2014-11-13 ENCOUNTER — Encounter (HOSPITAL_COMMUNITY): Payer: Self-pay | Admitting: Emergency Medicine

## 2014-11-13 ENCOUNTER — Emergency Department (HOSPITAL_COMMUNITY)
Admission: EM | Admit: 2014-11-13 | Discharge: 2014-11-14 | Disposition: A | Discharge status: Home / Self Care | Payer: Medicare Other | Attending: Emergency Medicine | Admitting: Emergency Medicine

## 2014-11-13 DIAGNOSIS — G43909 Migraine, unspecified, not intractable, without status migrainosus: Secondary | ICD-10-CM | POA: Diagnosis not present

## 2014-11-13 DIAGNOSIS — Z7951 Long term (current) use of inhaled steroids: Secondary | ICD-10-CM | POA: Diagnosis not present

## 2014-11-13 DIAGNOSIS — F329 Major depressive disorder, single episode, unspecified: Secondary | ICD-10-CM | POA: Diagnosis not present

## 2014-11-13 DIAGNOSIS — M1990 Unspecified osteoarthritis, unspecified site: Secondary | ICD-10-CM | POA: Diagnosis not present

## 2014-11-13 DIAGNOSIS — E559 Vitamin D deficiency, unspecified: Secondary | ICD-10-CM | POA: Diagnosis not present

## 2014-11-13 DIAGNOSIS — Z79891 Long term (current) use of opiate analgesic: Secondary | ICD-10-CM | POA: Insufficient documentation

## 2014-11-13 DIAGNOSIS — E669 Obesity, unspecified: Secondary | ICD-10-CM | POA: Diagnosis not present

## 2014-11-13 DIAGNOSIS — M797 Fibromyalgia: Secondary | ICD-10-CM | POA: Diagnosis not present

## 2014-11-13 DIAGNOSIS — Z791 Long term (current) use of non-steroidal anti-inflammatories (NSAID): Secondary | ICD-10-CM | POA: Diagnosis not present

## 2014-11-13 DIAGNOSIS — D510 Vitamin B12 deficiency anemia due to intrinsic factor deficiency: Secondary | ICD-10-CM | POA: Insufficient documentation

## 2014-11-13 DIAGNOSIS — E039 Hypothyroidism, unspecified: Secondary | ICD-10-CM | POA: Diagnosis not present

## 2014-11-13 DIAGNOSIS — F419 Anxiety disorder, unspecified: Secondary | ICD-10-CM | POA: Diagnosis not present

## 2014-11-13 DIAGNOSIS — E119 Type 2 diabetes mellitus without complications: Secondary | ICD-10-CM | POA: Diagnosis not present

## 2014-11-13 DIAGNOSIS — E871 Hypo-osmolality and hyponatremia: Secondary | ICD-10-CM | POA: Diagnosis present

## 2014-11-13 DIAGNOSIS — K219 Gastro-esophageal reflux disease without esophagitis: Secondary | ICD-10-CM | POA: Insufficient documentation

## 2014-11-13 DIAGNOSIS — Z8679 Personal history of other diseases of the circulatory system: Secondary | ICD-10-CM | POA: Diagnosis not present

## 2014-11-13 DIAGNOSIS — Z79899 Other long term (current) drug therapy: Secondary | ICD-10-CM | POA: Diagnosis not present

## 2014-11-13 DIAGNOSIS — Z880 Allergy status to penicillin: Secondary | ICD-10-CM | POA: Diagnosis not present

## 2014-11-13 HISTORY — DX: Hypo-osmolality and hyponatremia: E87.1

## 2014-11-13 HISTORY — DX: Hypotension, unspecified: I95.9

## 2014-11-13 HISTORY — DX: Hypoglycemia, unspecified: E16.2

## 2014-11-13 HISTORY — DX: Vitamin D deficiency, unspecified: E55.9

## 2014-11-13 LAB — CBC WITH DIFFERENTIAL/PLATELET
BASOS ABS: 0 10*3/uL (ref 0.0–0.1)
BASOS PCT: 1 %
EOS PCT: 3 %
Eosinophils Absolute: 0.2 10*3/uL (ref 0.0–0.7)
HCT: 32.4 % — ABNORMAL LOW (ref 36.0–46.0)
Hemoglobin: 10.7 g/dL — ABNORMAL LOW (ref 12.0–15.0)
Lymphocytes Relative: 28 %
Lymphs Abs: 1.6 10*3/uL (ref 0.7–4.0)
MCH: 31.6 pg (ref 26.0–34.0)
MCHC: 33 g/dL (ref 30.0–36.0)
MCV: 95.6 fL (ref 78.0–100.0)
MONO ABS: 0.4 10*3/uL (ref 0.1–1.0)
MONOS PCT: 7 %
Neutro Abs: 3.5 10*3/uL (ref 1.7–7.7)
Neutrophils Relative %: 61 %
PLATELETS: 165 10*3/uL (ref 150–400)
RBC: 3.39 MIL/uL — ABNORMAL LOW (ref 3.87–5.11)
RDW: 12.8 % (ref 11.5–15.5)
WBC: 5.7 10*3/uL (ref 4.0–10.5)

## 2014-11-13 LAB — COMPREHENSIVE METABOLIC PANEL
ALT: 28 U/L (ref 14–54)
AST: 24 U/L (ref 15–41)
Albumin: 4 g/dL (ref 3.5–5.0)
Alkaline Phosphatase: 56 U/L (ref 38–126)
Anion gap: 9 (ref 5–15)
BUN: 24 mg/dL — AB (ref 6–20)
CHLORIDE: 95 mmol/L — AB (ref 101–111)
CO2: 23 mmol/L (ref 22–32)
Calcium: 8.6 mg/dL — ABNORMAL LOW (ref 8.9–10.3)
Creatinine, Ser: 0.75 mg/dL (ref 0.44–1.00)
Glucose, Bld: 82 mg/dL (ref 65–99)
POTASSIUM: 4.1 mmol/L (ref 3.5–5.1)
Sodium: 127 mmol/L — ABNORMAL LOW (ref 135–145)
Total Bilirubin: 0.4 mg/dL (ref 0.3–1.2)
Total Protein: 6.6 g/dL (ref 6.5–8.1)

## 2014-11-13 LAB — MAGNESIUM: MAGNESIUM: 2 mg/dL (ref 1.7–2.4)

## 2014-11-13 MED ORDER — OXYCODONE HCL 5 MG PO TABS
10.0000 mg | ORAL_TABLET | Freq: Once | ORAL | Status: AC
  Administered 2014-11-13: 10 mg via ORAL
  Filled 2014-11-13: qty 2

## 2014-11-13 MED ORDER — SODIUM CHLORIDE 0.9 % IV BOLUS (SEPSIS)
1000.0000 mL | Freq: Once | INTRAVENOUS | Status: AC
  Administered 2014-11-14: 1000 mL via INTRAVENOUS

## 2014-11-13 MED ORDER — SODIUM CHLORIDE 0.9 % IV BOLUS (SEPSIS)
1000.0000 mL | Freq: Once | INTRAVENOUS | Status: AC
Start: 2014-11-13 — End: 2014-11-14
  Administered 2014-11-13: 1000 mL via INTRAVENOUS

## 2014-11-13 NOTE — ED Notes (Addendum)
EKG given to EDP,Zackowski,MD., for review. 

## 2014-11-13 NOTE — ED Notes (Signed)
Patient sent by PCP. Patient was seen today for follow up related to syncope. Patient states she was called today and told to come to the ER for her sodium being 123. [facility]

## 2014-11-13 NOTE — ED Provider Notes (Addendum)
CSN: [id number]     Arrival date & time 11/13/14  1955 History   First MD Initiated Contact with Patient 11/13/14 2121     Chief Complaint  Patient presents with  . low sodium     sent by PCP     (Consider location/radiation/quality/duration/timing/severity/associated sxs/prior Treatment) HPI  Sent here by PCP for hyponatremia found on a syncope workup. No symptoms. No recent syncope. 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 No n/v, cramping, weakness or other symptoms.   Past Medical History  Diagnosis Date  . Osteoporosis   . Hypothyroidism   . Exogenous obesity   . Rhinitis   . Arthritis   . Fibromyalgia   . Somnolence   . Sleep apnea   . Anemia, iron deficiency 04/06/2011  . Pernicious anemia 04/06/2011  . Depression   . Anxiety   . Iron deficiency anemia, unspecified 12/12/2012  . Iron deficiency anemia due to chronic blood loss 01/15/2014  . GERD (gastroesophageal reflux disease)   . GIB (gastrointestinal bleeding)   . H/O gastric bypass   . Common migraine with intractable migraine 06/12/2014  . Gait disorder 06/12/2014  . Diabetes mellitus     denies 11/13/2014  . Hypoglycemia   . Vitamin D deficiency   . Hyponatremia   . Hypotension    Past Surgical History  Procedure Laterality Date  . Gastric bypass    . Cholecystectomy    . Tracheostomy    . Uvulectomy    . Tonsillectomy    . Mandible reconstruction    . Spinal fusion      2 cervical fusion  . Dilation and curettage of uterus    . Cesarean section     Family History  Problem Relation Age of Onset  . CAD Father   . Diabetes Father   . CAD Brother      X3  . Hypertension Brother   . Colon cancer Brother    Social History  Substance Use Topics  . Smoking status: Never Smoker   . Smokeless tobacco: Never Used     Comment: never used tobacco  . Alcohol Use: No     Comment: one mixed drink twice yearly   OB History    No data available     Review of Systems  Constitutional:  Positive for fatigue. Negative for fever.  HENT: Negative for congestion and facial swelling.   Eyes: Negative for pain, discharge and redness.  Respiratory: Negative for cough and shortness of breath.   Cardiovascular: Negative for chest pain.  Gastrointestinal: Negative for abdominal pain and abdominal distention.  Endocrine: Negative for polydipsia.  Genitourinary: Negative for dysuria.  Musculoskeletal: Negative for back pain.  Skin: Negative for wound.  Neurological: Negative for headaches.  All other systems reviewed and are negative.     Allergies  Ambien; Benazepril hcl; Codeine; Iodine; Iron; Lovaza; Soma; Sulfonamide derivatives; Abilify; Cymbalta; Lunesta; Lyrica; Penicillins; Sulfa antibiotics; and Amitriptyline  Home Medications   Prior to Admission medications   Medication Sig Start Date End Date Taking? Authorizing Provider  acarbose (PRECOSE) 25 MG tablet Take 25 mg by mouth daily as needed (for low blood sugar).  10/09/12   Historical Provider, MD  alprazolam Prudy Feeler) 2 MG tablet Take two tablets by mouth twice daily as needed for anxiety; Take two tablets by mouth at bedtime as needed for sleep or anxiety Patient taking differently: Take 2 mg by mouth at bedtime. Take two tablets by mouth twice daily as  needed for anxiety; Take two tablets by mouth at bedtime as needed for sleep or anxiety 06/11/14   Josph MachoPeter R Ennever, MD  Cholecalciferol (VITAMIN D-3) 5000 UNITS TABS Take 2 tablets by mouth daily.    Historical Provider, MD  Cyanocobalamin (VITAMIN B-12 IJ) Inject as directed every 28 (twenty-eight) days.    Historical Provider, MD  diclofenac (VOLTAREN) 75 MG EC tablet Take 75 mg by mouth 2 (two) times daily.    Historical Provider, MD  docusate sodium 100 MG CAPS Take 100 mg by mouth 2 (two) times daily. Patient taking differently: Take 100 mg by mouth 3 (three) times a week.  11/03/13   Renae FickleMackenzie Short, MD  furosemide (LASIX) 20 MG tablet Take 20 mg by mouth daily at 12  noon. 03/24/14   Historical Provider, MD  ibuprofen (ADVIL,MOTRIN) 800 MG tablet  04/22/14   Historical Provider, MD  KLOR-CON M20 20 MEQ tablet Take 1 tablet by mouth daily. 03/24/14   Historical Provider, MD  levothyroxine (SYNTHROID, LEVOTHROID) 125 MCG tablet  09/24/14   Historical Provider, MD  loratadine (CLARITIN) 10 MG tablet Take 1 tablet (10 mg total) by mouth 2 (two) times daily. Patient taking differently: Take 10 mg by mouth daily as needed for allergies.  07/13/12   Thermon LeylandLaura A Davis, NP  Magnesium 250 MG TABS Take 1 tablet by mouth daily.    Historical Provider, MD  modafinil (PROVIGIL) 200 MG tablet Take 2 tablets (400 mg total) by mouth every morning. 11/05/13   Mahima Glade LloydPandey, MD  mometasone (NASONEX) 50 MCG/ACT nasal spray Place 2 sprays into the nose daily.    Historical Provider, MD  omeprazole (PRILOSEC) 40 MG capsule Take 1 capsule (40 mg total) by mouth daily. 07/13/12   Thermon LeylandLaura A Davis, NP  OPANA ER, CRUSH RESISTANT, 30 MG T12A  10/02/14   Historical Provider, MD  Oxycodone HCl 10 MG TABS Take 10 mg by mouth every 4 (four) hours as needed.     Historical Provider, MD  oxymorphone (OPANA ER) 30 MG 12 hr tablet Take 1 tablet (30 mg total) by mouth every 12 (twelve) hours. 05/15/14   Standley Brookinganiel P Goodrich, MD  PARoxetine (PAXIL) 20 MG tablet Take 20 mg by mouth 2 (two) times daily. 03/24/14   Historical Provider, MD  promethazine (PHENERGAN) 25 MG tablet Take 1 tablet by mouth at bedtime. 07/21/14   Historical Provider, MD  SUMAtriptan (IMITREX) 100 MG tablet Take 100 mg by mouth every 2 (two) hours as needed for migraine.  05/01/12   Historical Provider, MD  tiZANidine (ZANAFLEX) 2 MG tablet Take 1 tablet (2 mg total) by mouth 3 (three) times daily. 09/17/14   York Spanielharles K Willis, MD  topiramate (TOPAMAX) 200 MG tablet Take 1 tablet (200 mg total) by mouth at bedtime. 07/13/12   Thermon LeylandLaura A Davis, NP  valACYclovir (VALTREX) 1000 MG tablet Take 1,000 mg by mouth daily as needed (for flare ups).  08/16/11    Historical Provider, MD   BP 122/76 mmHg  Pulse 67  Temp(Src) 98.4 F (36.9 C) (Oral)  Resp 11  SpO2 100% Physical Exam  Constitutional: She appears well-developed and well-nourished.  HENT:  Head: Normocephalic and atraumatic.  Neck: Normal range of motion.  Cardiovascular: Normal rate and regular rhythm.   Pulmonary/Chest: No stridor. No respiratory distress.  Abdominal: She exhibits no distension.  Neurological: She is alert.  No altered mental status, able to give full seemingly accurate history.  Face is symmetric, EOM's intact, pupils equal and  reactive, vision intact, tongue and uvula midline without deviation Upper and Lower extremity motor 5/5, intact pain perception in distal extremities, 2+ reflexes in biceps, patella and achilles tendons. Finger to nose normal, heel to shin normal. Walks without assistance or evident ataxia.  Nursing note and vitals reviewed.   ED Course  Procedures (including critical care time) Labs Review Labs Reviewed  CBC WITH DIFFERENTIAL/PLATELET - Abnormal; Notable for the following:    RBC 3.39 (*)    Hemoglobin 10.7 (*)    HCT 32.4 (*)    All other components within normal limits  COMPREHENSIVE METABOLIC PANEL - Abnormal; Notable for the following:    Sodium 127 (*)    Chloride 95 (*)    BUN 24 (*)    Calcium 8.6 (*)    All other components within normal limits  MAGNESIUM    Imaging Review No results found. I have personally reviewed and evaluated these images and lab results as part of my medical decision-making.   EKG Interpretation None      MDM   Final diagnoses:  Hyponatremia   Patient with sodium of 123 at PCP, here it is 127, and is asymptomatic. However she was being worked up for this by PCP. Will discuss with hospitalist team about admission.   Hospitalist, Dr. Julian Reil, evaluated patient, reviewed labs and history and does not feel inpatietn workup is needed at this time. Feels it is likely psychogenic in  nature. She has not had recent manifestations of hyponatremia. I agree and feel patient is stable for dc with decreased free water intake and PCP follow up in a couple days for sodium recheck.   I have personally and contemperaneously reviewed labs and imaging and used in my decision making as above.   A medical screening exam was performed and I feel the patient has had an appropriate workup for their chief complaint at this time and likelihood of emergent condition existing is low. They have been counseled on decision, discharge, follow up and which symptoms necessitate immediate return to the emergency department. They or their family verbally stated understanding and agreement with plan and discharged in stable condition.     Marily Memos, MD 11/18/14 [phone]

## 2014-11-14 DIAGNOSIS — E871 Hypo-osmolality and hyponatremia: Secondary | ICD-10-CM | POA: Diagnosis not present

## 2014-11-14 NOTE — Consult Note (Signed)
[facility] Medical Consultation  Valerie Silva ZOX:[id number] DOB: 1961/01/07 DOA: 11/13/2014 PCP: [name], MD   Requesting physician: EDP Date of consultation: 11/14/14 Reason for consultation: Hyponatremia  Impression/Recommendations Active Problems:   Chronic hyponatremia    1. Chronic hyponatremia - Sodium is 127 today, for her this actually is about baseline, much better than prior visits of 120 when she required admission. Given the lack of acute symptoms: 1. Feel that it is reasonable to discharge patient home 2. Recommend a 1.5 cc /day fluid restriction 3. Please see HPI below and work up in April of this year for more information    Chief Complaint: Hyponatremia  HPI:  54 yo F who was sent in by her PCP due to report of sodium of 123 on lab work today.  She has history of hyponatremia believed to be due primarily to polydipsia (see the workup done on the April admission from this year).  She admits that she drinks an "unknown" amount of water daily, she has a large jug of ice and water that she frequently refills during the day due to feeling of dry mouth and throat.  Last syncopal episode (symptoms) was in August of this year, she is essentially asymptomatic at this time except when she stands up very quickly.  Review of Systems:  12 systems reviewed and otherwise negative  Past Medical History  Diagnosis Date  . Osteoporosis   . Hypothyroidism   . Exogenous obesity   . Rhinitis   . Arthritis   . Fibromyalgia   . Somnolence   . Sleep apnea   . Anemia, iron deficiency 04/06/2011  . Pernicious anemia 04/06/2011  . Depression   . Anxiety   . Iron deficiency anemia, unspecified 12/12/2012  . Iron deficiency anemia due to chronic blood loss 01/15/2014  . GERD (gastroesophageal reflux disease)   . GIB (gastrointestinal bleeding)   . H/O gastric bypass   . Common migraine with intractable migraine 06/12/2014  . Gait disorder 06/12/2014  . Diabetes mellitus      denies 11/13/2014  . Hypoglycemia   . Vitamin D deficiency   . Hyponatremia   . Hypotension    Past Surgical History  Procedure Laterality Date  . Gastric bypass    . Cholecystectomy    . Tracheostomy    . Uvulectomy    . Tonsillectomy    . Mandible reconstruction    . Spinal fusion      2 cervical fusion  . Dilation and curettage of uterus    . Cesarean section     Social History:  reports that she has never smoked. She has never used smokeless tobacco. She reports that she does not drink alcohol or use illicit drugs.  Allergies  Allergen Reactions  . Ambien [Zolpidem Tartrate] Itching and Other (See Comments)    Pt states that this medication causes blackouts.   . Benazepril Hcl Other (See Comments)    Severe cough   . Codeine Other (See Comments)    Pt states that this medication causes rapid heartbeat.   . Iodine Other (See Comments)    Sensitive when used gynecologically (intra-vaginally).  . Iron Itching    Iron taken by mouth causes this reaction. The patient has had several iron infusions plus Feraheme that has not caused any side effects.   . Lovaza [Omega-3-Acid Ethyl Esters] Itching  . Soma [Carisoprodol] Other (See Comments)    Pt states that this medication causes panic attacks.   . Sulfonamide  Derivatives Other (See Comments)    Sensitive when used intra-vaginally.  . Abilify [Aripiprazole] Other (See Comments)    Chest tightness  . Cymbalta [Duloxetine Hcl] Swelling  . Lunesta [Eszopiclone] Other (See Comments)    unknown  . Lyrica [Pregabalin]     Pt can not remember  . Penicillins Other (See Comments)    Severe family allergy.  Family physician long ago stated she shouldn't take this med since mother and brother nearly died from anaphylaxis.  . Sulfa Antibiotics Other (See Comments)    Pt unable to recall reaction - occurred as a child.  . Amitriptyline Other (See Comments)    Pt states that this medication causes sleep walking.    Family  History  Problem Relation Age of Onset  . CAD Father   . Diabetes Father   . CAD Brother      X3  . Hypertension Brother   . Colon cancer Brother     Prior to Admission medications   Medication Sig Start Date End Date Taking? Authorizing Provider  acarbose (PRECOSE) 25 MG tablet Take 25 mg by mouth daily as needed (for low blood sugar).  10/09/12   Historical Provider, MD  alprazolam Prudy Feeler) 2 MG tablet Take two tablets by mouth twice daily as needed for anxiety; Take two tablets by mouth at bedtime as needed for sleep or anxiety Patient taking differently: Take 2 mg by mouth at bedtime. Take two tablets by mouth twice daily as needed for anxiety; Take two tablets by mouth at bedtime as needed for sleep or anxiety 06/11/14   Josph Macho, MD  Cholecalciferol (VITAMIN D-3) 5000 UNITS TABS Take 2 tablets by mouth daily.    Historical Provider, MD  Cyanocobalamin (VITAMIN B-12 IJ) Inject as directed every 28 (twenty-eight) days.    Historical Provider, MD  diclofenac (VOLTAREN) 75 MG EC tablet Take 75 mg by mouth 2 (two) times daily.    Historical Provider, MD  docusate sodium 100 MG CAPS Take 100 mg by mouth 2 (two) times daily. Patient taking differently: Take 100 mg by mouth 3 (three) times a week.  11/03/13   Renae Fickle, MD  furosemide (LASIX) 20 MG tablet Take 20 mg by mouth daily at 12 noon. 03/24/14   Historical Provider, MD  ibuprofen (ADVIL,MOTRIN) 800 MG tablet  04/22/14   Historical Provider, MD  KLOR-CON M20 20 MEQ tablet Take 1 tablet by mouth daily. 03/24/14   Historical Provider, MD  levothyroxine (SYNTHROID, LEVOTHROID) 125 MCG tablet  09/24/14   Historical Provider, MD  loratadine (CLARITIN) 10 MG tablet Take 1 tablet (10 mg total) by mouth 2 (two) times daily. Patient taking differently: Take 10 mg by mouth daily as needed for allergies.  07/13/12   Thermon Leyland, NP  Magnesium 250 MG TABS Take 1 tablet by mouth daily.    Historical Provider, MD  modafinil (PROVIGIL) 200 MG  tablet Take 2 tablets (400 mg total) by mouth every morning. 11/05/13   Mahima Glade Lloyd, MD  mometasone (NASONEX) 50 MCG/ACT nasal spray Place 2 sprays into the nose daily.    Historical Provider, MD  omeprazole (PRILOSEC) 40 MG capsule Take 1 capsule (40 mg total) by mouth daily. 07/13/12   Thermon Leyland, NP  OPANA ER, CRUSH RESISTANT, 30 MG T12A  10/02/14   Historical Provider, MD  Oxycodone HCl 10 MG TABS Take 10 mg by mouth every 4 (four) hours as needed.     Historical Provider, MD  oxymorphone (OPANA  ER) 30 MG 12 hr tablet Take 1 tablet (30 mg total) by mouth every 12 (twelve) hours. 05/15/14   Standley Brookinganiel P Goodrich, MD  PARoxetine (PAXIL) 20 MG tablet Take 20 mg by mouth 2 (two) times daily. 03/24/14   Historical Provider, MD  promethazine (PHENERGAN) 25 MG tablet Take 1 tablet by mouth at bedtime. 07/21/14   Historical Provider, MD  SUMAtriptan (IMITREX) 100 MG tablet Take 100 mg by mouth every 2 (two) hours as needed for migraine.  05/01/12   Historical Provider, MD  tiZANidine (ZANAFLEX) 2 MG tablet Take 1 tablet (2 mg total) by mouth 3 (three) times daily. 09/17/14   York Spanielharles K Willis, MD  topiramate (TOPAMAX) 200 MG tablet Take 1 tablet (200 mg total) by mouth at bedtime. 07/13/12   Thermon LeylandLaura A Davis, NP  valACYclovir (VALTREX) 1000 MG tablet Take 1,000 mg by mouth daily as needed (for flare ups).  08/16/11   Historical Provider, MD   Physical Exam: Blood pressure 116/74, pulse 67, temperature 98.4 F (36.9 C), temperature source Oral, resp. rate 13, SpO2 97 %. Filed Vitals:   11/13/14 2330  BP: 116/74  Pulse: 67  Temp:   Resp: 13    General:  NAD, resting comfortably in bed Eyes: PEERLA EOMI ENT: mucous membranes moist Neck: supple w/o JVD Cardiovascular: RRR w/o MRG Respiratory: CTA B Abdomen: soft, nt, nd, bs+ Skin: no rash nor lesion Musculoskeletal: MAE, full ROM all 4 extremities Psychiatric: normal tone and affect Neurologic: AAOx3, grossly non-focal   Labs on Admission:  Basic  Metabolic Panel:  Recent Labs Lab 11/13/14 2158  NA 127*  K 4.1  CL 95*  CO2 23  GLUCOSE 82  BUN 24*  CREATININE 0.75  CALCIUM 8.6*  MG 2.0   Liver Function Tests:  Recent Labs Lab 11/13/14 2158  AST 24  ALT 28  ALKPHOS 56  BILITOT 0.4  PROT 6.6  ALBUMIN 4.0   No results for input(s): LIPASE, AMYLASE in the last 168 hours. No results for input(s): AMMONIA in the last 168 hours. CBC:  Recent Labs Lab 11/13/14 2158  WBC 5.7  NEUTROABS 3.5  HGB 10.7*  HCT 32.4*  MCV 95.6  PLT 165   Cardiac Enzymes: No results for input(s): CKTOTAL, CKMB, CKMBINDEX, TROPONINI in the last 168 hours. BNP: Invalid input(s): POCBNP CBG: No results for input(s): GLUCAP in the last 168 hours.  Radiological Exams on Admission: No results found.  EKG: Independently reviewed.  Time spent: 60 min  GARDNER, JARED M. [facility] Pager [phone]  If 7PM-7AM, please contact night-coverage [email or web address] Password [email or web address] 11/14/2014, 12:25 AM

## 2014-12-25 ENCOUNTER — Other Ambulatory Visit: Payer: Self-pay | Admitting: Hematology & Oncology

## 2015-01-20 ENCOUNTER — Encounter: Payer: Self-pay | Admitting: Hematology & Oncology

## 2015-01-20 ENCOUNTER — Ambulatory Visit (HOSPITAL_BASED_OUTPATIENT_CLINIC_OR_DEPARTMENT_OTHER): Payer: Medicare Other | Admitting: Hematology & Oncology

## 2015-01-20 ENCOUNTER — Other Ambulatory Visit (HOSPITAL_BASED_OUTPATIENT_CLINIC_OR_DEPARTMENT_OTHER): Payer: Medicare Other

## 2015-01-20 ENCOUNTER — Ambulatory Visit (HOSPITAL_BASED_OUTPATIENT_CLINIC_OR_DEPARTMENT_OTHER): Payer: Medicare Other

## 2015-01-20 VITALS — BP 113/68 | HR 85 | Temp 97.7°F | Resp 16 | Ht 67.0 in | Wt 196.0 lb

## 2015-01-20 DIAGNOSIS — E538 Deficiency of other specified B group vitamins: Secondary | ICD-10-CM

## 2015-01-20 DIAGNOSIS — D509 Iron deficiency anemia, unspecified: Secondary | ICD-10-CM

## 2015-01-20 DIAGNOSIS — D500 Iron deficiency anemia secondary to blood loss (chronic): Secondary | ICD-10-CM

## 2015-01-20 DIAGNOSIS — Z9884 Bariatric surgery status: Secondary | ICD-10-CM | POA: Diagnosis not present

## 2015-01-20 DIAGNOSIS — D510 Vitamin B12 deficiency anemia due to intrinsic factor deficiency: Secondary | ICD-10-CM

## 2015-01-20 DIAGNOSIS — E559 Vitamin D deficiency, unspecified: Secondary | ICD-10-CM

## 2015-01-20 DIAGNOSIS — Z23 Encounter for immunization: Secondary | ICD-10-CM

## 2015-01-20 DIAGNOSIS — F064 Anxiety disorder due to known physiological condition: Secondary | ICD-10-CM | POA: Diagnosis not present

## 2015-01-20 LAB — CBC WITH DIFFERENTIAL (CANCER CENTER ONLY)
BASO#: 0 10*3/uL (ref 0.0–0.2)
BASO%: 0.6 % (ref 0.0–2.0)
EOS%: 4.7 % (ref 0.0–7.0)
Eosinophils Absolute: 0.2 10*3/uL (ref 0.0–0.5)
HEMATOCRIT: 36.1 % (ref 34.8–46.6)
HGB: 11.4 g/dL — ABNORMAL LOW (ref 11.6–15.9)
LYMPH#: 0.9 10*3/uL (ref 0.9–3.3)
LYMPH%: 28.7 % (ref 14.0–48.0)
MCH: 31.8 pg (ref 26.0–34.0)
MCHC: 31.6 g/dL — AB (ref 32.0–36.0)
MCV: 101 fL (ref 81–101)
MONO#: 0.3 10*3/uL (ref 0.1–0.9)
MONO%: 8.1 % (ref 0.0–13.0)
NEUT#: 1.9 10*3/uL (ref 1.5–6.5)
NEUT%: 57.9 % (ref 39.6–80.0)
PLATELETS: 221 10*3/uL (ref 145–400)
RBC: 3.58 10*6/uL — ABNORMAL LOW (ref 3.70–5.32)
RDW: 12.4 % (ref 11.1–15.7)
WBC: 3.2 10*3/uL — ABNORMAL LOW (ref 3.9–10.0)

## 2015-01-20 LAB — COMPREHENSIVE METABOLIC PANEL
ALBUMIN: 3.7 g/dL (ref 3.5–5.0)
ALK PHOS: 78 U/L (ref 40–150)
ALT: 41 U/L (ref 0–55)
ANION GAP: 9 meq/L (ref 3–11)
AST: 26 U/L (ref 5–34)
BUN: 16.2 mg/dL (ref 7.0–26.0)
CO2: 23 mEq/L (ref 22–29)
CREATININE: 0.8 mg/dL (ref 0.6–1.1)
Calcium: 8.6 mg/dL (ref 8.4–10.4)
Chloride: 102 mEq/L (ref 98–109)
EGFR: 82 mL/min/{1.73_m2} — AB (ref >90)
GLUCOSE: 75 mg/dL (ref 70–140)
Potassium: 3.8 mEq/L (ref 3.5–5.1)
Sodium: 134 mEq/L — ABNORMAL LOW (ref 136–145)
Total Protein: 6.7 g/dL (ref 6.4–8.3)

## 2015-01-20 MED ORDER — CYANOCOBALAMIN 1000 MCG/ML IJ SOLN
1000.0000 ug | Freq: Once | INTRAMUSCULAR | Status: AC
  Administered 2015-01-20: 1000 ug via INTRAMUSCULAR

## 2015-01-20 MED ORDER — ALPRAZOLAM 2 MG PO TABS: ORAL_TABLET | ORAL | Status: DC

## 2015-01-20 MED ORDER — CYANOCOBALAMIN 1000 MCG/ML IJ SOLN
INTRAMUSCULAR | Status: AC
  Filled 2015-01-20: qty 1

## 2015-01-20 NOTE — Patient Instructions (Signed)

## 2015-01-21 LAB — IRON AND TIBC
%SAT: 32 % (ref 21–57)
IRON: 75 ug/dL (ref 41–142)
TIBC: 230 ug/dL — ABNORMAL LOW (ref 236–444)
UIBC: 155 ug/dL (ref 120–384)

## 2015-01-21 LAB — FERRITIN: FERRITIN: 651 ng/mL — AB (ref 9–269)

## 2015-01-21 LAB — VITAMIN D 25 HYDROXY (VIT D DEFICIENCY, FRACTURES): Vitamin D, 25-Hydroxy: 36.8 ng/mL (ref 30.0–100.0)

## 2015-01-21 LAB — VITAMIN B12: Vitamin B12: 317 pg/mL (ref 211–946)

## 2015-01-21 LAB — RETICULOCYTES: Reticulocyte Count: 1.2 % (ref 0.6–2.6)

## 2015-01-21 NOTE — Progress Notes (Signed)
Hematology and Oncology Follow Up Visit  Valerie HongLaura J Silva [id number] 11-02-1960 55 y.o. 01/21/2015   Principle Diagnosis:   ntermittent iron deficiency anemia  Vitamin B12 deficiency  Gastric bypass  Severe anxiety  Current Therapy:    IV iron as indicated  Vitamin B12 1 mg IM q. 3 months     Interim History:  [name] is back for followup. She made it through the holidays. She did have a tell time. Her ex-husband passed away last year and she had sometime not be able to see him over the holidays.  She is moving into a new apartment. This I think will help her out quite a bit.  Her son, who has a lot of personal issues, seems to be doing a little better.  There is still legal issues that she is trying to deal with.  She's had no bleeding.  His been no obvious change in bowel or bladder habits. She's had no leg swelling. She's had no rashes.  I saw her in October, her ferritin was 525 with a iron saturation of 39%. The last time that we gave her iron was back in October 2016.   Overall, her performance status is ECOG 2-3.   Medications:  Current outpatient prescriptions:  .  acarbose (PRECOSE) 25 MG tablet, Take 25 mg by mouth daily as needed (for low blood sugar). , Disp: , Rfl:  .  alprazolam (XANAX) 2 MG tablet, TAKE TWO TABLETS BY MOUTH TWICE DAILY AS NEEDED FOR ANXIETY AND/OR SLEEP, Disp: 180 tablet, Rfl: 0 .  Cholecalciferol (VITAMIN D-3) 5000 UNITS TABS, Take 2 tablets by mouth daily., Disp: , Rfl:  .  diclofenac (VOLTAREN) 75 MG EC tablet, Take 75 mg by mouth 2 (two) times daily., Disp: , Rfl:  .  docusate sodium 100 MG CAPS, Take 100 mg by mouth 2 (two) times daily. (Patient taking differently: Take 400 mg by mouth daily. ), Disp: 10 capsule, Rfl: 0 .  furosemide (LASIX) 20 MG tablet, Take 20 mg by mouth every morning. , Disp: , Rfl:  .  Gelatin-Mineral Combinations (NAIL STRENGTHENER/GELATIN) CAPS, Take 1 capsule by mouth daily., Disp: , Rfl:  .  ibuprofen  (ADVIL,MOTRIN) 800 MG tablet, Take 800 mg by mouth every 8 (eight) hours as needed for moderate pain. , Disp: , Rfl:  .  KLOR-CON M20 20 MEQ tablet, Take 1 tablet by mouth daily., Disp: , Rfl:  .  levothyroxine (SYNTHROID, LEVOTHROID) 125 MCG tablet, Take 250 mcg by mouth daily before breakfast. , Disp: , Rfl:  .  loratadine (CLARITIN) 10 MG tablet, Take 1 tablet (10 mg total) by mouth 2 (two) times daily. (Patient taking differently: Take 20 mg by mouth 2 (two) times daily. ), Disp: , Rfl:  .  Magnesium 250 MG TABS, Take 1 tablet by mouth daily., Disp: , Rfl:  .  modafinil (PROVIGIL) 200 MG tablet, Take 2 tablets (400 mg total) by mouth every morning., Disp: 60 tablet, Rfl: 5 .  mometasone (NASONEX) 50 MCG/ACT nasal spray, Place 2 sprays into the nose daily., Disp: , Rfl:  .  omeprazole (PRILOSEC) 40 MG capsule, Take 1 capsule (40 mg total) by mouth daily., Disp: 30 capsule, Rfl: 1 .  Oxycodone HCl 10 MG TABS, Take 10 mg by mouth every 4 (four) hours as needed (pain). , Disp: , Rfl:  .  oxymorphone (OPANA ER) 30 MG 12 hr tablet, Take 1 tablet (30 mg total) by mouth every 12 (twelve) hours., Disp: 10  tablet, Rfl: 0 .  PARoxetine (PAXIL) 20 MG tablet, Take 20 mg by mouth 2 (two) times daily., Disp: , Rfl:  .  promethazine (PHENERGAN) 25 MG tablet, Take 1 tablet by mouth every 6 (six) hours as needed for nausea. , Disp: , Rfl:  .  SUMAtriptan (IMITREX) 100 MG tablet, Take 100 mg by mouth every 2 (two) hours as needed for migraine. , Disp: , Rfl:  .  tiZANidine (ZANAFLEX) 2 MG tablet, Take 1 tablet (2 mg total) by mouth 3 (three) times daily. (Patient taking differently: Take 2 mg by mouth 2 (two) times daily as needed. ), Disp: 90 tablet, Rfl: 1 .  topiramate (TOPAMAX) 200 MG tablet, Take 1 tablet (200 mg total) by mouth at bedtime., Disp: 30 tablet, Rfl: 0 .  valACYclovir (VALTREX) 1000 MG tablet, Take 1,000 mg by mouth daily as needed (for flare ups). , Disp: , Rfl:   Allergies:  Allergies   Allergen Reactions  . Ambien [Zolpidem Tartrate] Itching and Other (See Comments)    Pt states that this medication causes blackouts.   . Benazepril Hcl Other (See Comments)    Severe cough   . Codeine Other (See Comments)    Pt states that this medication causes rapid heartbeat.   . Iodine Other (See Comments)    Sensitive when used gynecologically (intra-vaginally).  . Iron Itching    Iron taken by mouth causes this reaction. The patient has had several iron infusions plus Feraheme that has not caused any side effects.   . Lovaza [Omega-3-Acid Ethyl Esters] Itching  . Soma [Carisoprodol] Other (See Comments)    Pt states that this medication causes panic attacks.   . Sulfonamide Derivatives Other (See Comments)    Sensitive when used intra-vaginally.  . Abilify [Aripiprazole] Other (See Comments)    Chest tightness  . Cymbalta [Duloxetine Hcl] Swelling  . Lunesta [Eszopiclone] Other (See Comments)    unknown  . Lyrica [Pregabalin]     Pt can not remember  . Penicillins Other (See Comments)    Severe family allergy.  Family physician long ago stated she shouldn't take this med since mother and brother nearly died from anaphylaxis.  . Sulfa Antibiotics Other (See Comments)    Pt unable to recall reaction - occurred as a child.  . Amitriptyline Other (See Comments)    Pt states that this medication causes sleep walking.     Past Medical History, Surgical history, Social history, and Family History were reviewed and updated.  Review of Systems: As above  Physical Exam:  height is 5\' 7"  (1.702 m) and weight is 196 lb (88.905 kg). Her oral temperature is 97.7 F (36.5 C). Her blood pressure is 113/68 and her pulse is 85. Her respiration is 16.   Obese white female. Head and neck exam shows no ocular or oral lesions. She has no palpable lymph nodes in her neck. Thyroid is not palpable. Lungs are clear. Cardiac exam shows a regular rate and rhythm with no murmurs, rubs or bruits..  Abdomen is soft. Has good bowel sounds. She has well-healed laparotomy scars. Back exam shows no tenderness over the spine. She does have healed laminectomy scars. Extremities shows some chronic trace edema in her legs. Skin exam shows no rashes, ecchymoses or petechia. Neurological exam is nonfocal.   Lab Results  Component Value Date   WBC 3.2* 01/20/2015   HGB 11.4* 01/20/2015   HCT 36.1 01/20/2015   MCV 101 01/20/2015   PLT 221 01/20/2015  Chemistry      Component Value Date/Time   NA 134* 01/20/2015 1231   NA 127* 11/13/2014 2158   NA 135 10/16/2014 1318   K 3.8 01/20/2015 1231   K 4.1 11/13/2014 2158   K 4.1 10/16/2014 1318   CL 95* 11/13/2014 2158   CL 103 10/16/2014 1318   CO2 23 01/20/2015 1231   CO2 23 11/13/2014 2158   CO2 22 10/16/2014 1318   BUN 16.2 01/20/2015 1231   BUN 24* 11/13/2014 2158   BUN 22 10/16/2014 1318   CREATININE 0.8 01/20/2015 1231   CREATININE 0.75 11/13/2014 2158   CREATININE 0.9 10/16/2014 1318      Component Value Date/Time   CALCIUM 8.6 01/20/2015 1231   CALCIUM 8.6* 11/13/2014 2158   CALCIUM 8.8 10/16/2014 1318   ALKPHOS 78 01/20/2015 1231   ALKPHOS 56 11/13/2014 2158   ALKPHOS 68 10/16/2014 1318   AST 26 01/20/2015 1231   AST 24 11/13/2014 2158   AST 36 10/16/2014 1318   ALT 41 01/20/2015 1231   ALT 28 11/13/2014 2158   ALT 42 10/16/2014 1318   BILITOT <0.30 01/20/2015 1231   BILITOT 0.4 11/13/2014 2158   BILITOT 0.40 10/16/2014 1318         Impression and Plan: Ms. Ganson is a 55 year old female. She has a history gastric bypass. She is iron deficient and B12 deficient.  I would be surprised if she needs IV iron. Her hemoglobin is better. Her MCV is better.  We will go ahead and give her B-12.  I will plan to have her come back in about 3 months.  Hopefully, the new apartment that she will move into will help her out and provide a little bit of stability for her.   I will plan to get her back to see [place] in another  3-4 months.   Josph Macho, MD 1/11/20177:30 AM

## 2015-03-04 ENCOUNTER — Other Ambulatory Visit: Payer: Self-pay | Admitting: *Deleted

## 2015-03-04 DIAGNOSIS — F064 Anxiety disorder due to known physiological condition: Secondary | ICD-10-CM

## 2015-03-04 DIAGNOSIS — D510 Vitamin B12 deficiency anemia due to intrinsic factor deficiency: Secondary | ICD-10-CM

## 2015-03-04 DIAGNOSIS — D500 Iron deficiency anemia secondary to blood loss (chronic): Secondary | ICD-10-CM

## 2015-03-04 MED ORDER — ALPRAZOLAM 2 MG PO TABS: ORAL_TABLET | ORAL | Status: DC

## 2015-03-11 ENCOUNTER — Encounter: Payer: Self-pay | Admitting: Nurse Practitioner

## 2015-03-11 ENCOUNTER — Ambulatory Visit (INDEPENDENT_AMBULATORY_CARE_PROVIDER_SITE_OTHER): Payer: Medicare Other | Admitting: Nurse Practitioner

## 2015-03-11 VITALS — BP 109/71 | HR 88 | Ht 67.0 in | Wt 189.2 lb

## 2015-03-11 DIAGNOSIS — R269 Unspecified abnormalities of gait and mobility: Secondary | ICD-10-CM | POA: Diagnosis not present

## 2015-03-11 DIAGNOSIS — G43019 Migraine without aura, intractable, without status migrainosus: Secondary | ICD-10-CM

## 2015-03-11 DIAGNOSIS — F1190 Opioid use, unspecified, uncomplicated: Secondary | ICD-10-CM

## 2015-03-11 DIAGNOSIS — W19XXXD Unspecified fall, subsequent encounter: Secondary | ICD-10-CM

## 2015-03-11 MED ORDER — SUMATRIPTAN SUCCINATE 100 MG PO TABS: 100.0000 mg | ORAL_TABLET | ORAL | Status: AC | PRN

## 2015-03-11 MED ORDER — TOPIRAMATE 200 MG PO TABS: 200.0000 mg | ORAL_TABLET | Freq: Every day | ORAL | Status: DC

## 2015-03-11 NOTE — Progress Notes (Signed)
I have read the note, and I agree with the clinical assessment and plan.  Jaris Kohles KEITH   

## 2015-03-11 NOTE — Progress Notes (Signed)
[facility]  PATIENT: Valerie Silva DOB: [birth year]   REASON FOR VISIT: follow-up for gait disorder and frequent falls, migraine, back pain HISTORY FROM: patient    HISTORY OF PRESENT ILLNESS:  Valerie Silva,  55 year old female returns for follow-up. She was last in the office by Dr. Anne Hahn 06/12/2014. She continues to have frequent falls. She also has chronic migraine headaches.  She has some form of headache every day she claims.  Her headaches can be in the back of the head associated with a throbbing sensation with photophobia and phonophobia. She occasionally is nauseated and vomiting. She tells me she gets frequent iron  infusions with Dr. Twanna Hy. Recent ER visit in November due to hyponatremia. She lives alone. She tells me several falls have occurred walking the dog.  She received some physical therapy last year but never followed up with her home exercise program .  She is on narcotic pain medication which has recently been decreased she states.  She is followed for her back pain by Dr. Charlett Blake. She is trying to find a new pain management clinic. She returns for reevaluation   HISTORY: Valerie Silva is a 55 year old left-handed white female with a history of chronic migraine headache. She has not been seen through this office since 01/23/2012. The patient has had a series of hospitalizations associated with falling. The patient has been noted to have significant hyponatremia with some of these admissions, the sodium level was down to 120 around 04/11/2014. The patient indicates that she has sustained several concussions because of the falls. She has had a chronic gait disorder. She has chronic low back pain, followed by Dr. Charlett Blake. The patient indicates that she will occasionally fall at nighttime when she takes her alprazolam, 2 mg tablets, for sleep. The patient is on chronic daily opiate drugs for her back pain. She continues to have her migraine headaches on a daily  basis. The headaches are on the top of the head, sometimes in the back of the head associated with a viselike squeezing sensation, occasional throbbing. She may have photophobia and phonophobia with the headache, and she may have nausea, no vomiting. She may have some blurring of vision as well. She denies any numbness or weakness of the extremities. She comes to this office for further evaluation.   REVIEW OF SYSTEMS: Full 14 system review of systems performed and notable only for those listed, all others are neg:  Constitutional:  fatigue  Cardiovascular: neg Ear/Nose/Throat: neg  Skin: neg Eyes:  Blurred vision, light sensitivity Respiratory:  Shortness of breath Gastroitestinal:  constipation  Hematology/Lymphatic: neg  Endocrine: neg Musculoskeletal: joint pain back pain, neck pain walking difficulty Allergy/Immunology: neg Neurological:  Headache tremors, memory loss Psychiatric:  depression and anxiety Sleep :  Daytime sleepiness   ALLERGIES: Allergies  Allergen Reactions  . Ambien [Zolpidem Tartrate] Itching and Other (See Comments)    Pt states that this medication causes blackouts.   . Benazepril Hcl Other (See Comments)    Severe cough   . Codeine Other (See Comments)    Pt states that this medication causes rapid heartbeat.   . Iodine Other (See Comments)    Sensitive when used gynecologically (intra-vaginally).  . Iron Itching    Iron taken by mouth causes this reaction. The patient has had several iron infusions plus Feraheme that has not caused any side effects.   . Lovaza [Omega-3-Acid Ethyl Esters] Itching  . Soma [Carisoprodol] Other (See Comments)    Pt states  that this medication causes panic attacks.   . Sulfonamide Derivatives Other (See Comments)    Sensitive when used intra-vaginally.  . Abilify [Aripiprazole] Other (See Comments)    Chest tightness  . Cymbalta [Duloxetine Hcl] Swelling  . Lunesta [Eszopiclone] Other (See Comments)    unknown  .  Lyrica [Pregabalin]     Pt can not remember  . Penicillins Other (See Comments)    Severe family allergy.  Family physician long ago stated she shouldn't take this med since mother and brother nearly died from anaphylaxis.  . Sulfa Antibiotics Other (See Comments)    Pt unable to recall reaction - occurred as a child.  . Amitriptyline Other (See Comments)    Pt states that this medication causes sleep walking.     HOME MEDICATIONS: Outpatient Prescriptions Prior to Visit  Medication Sig Dispense Refill  . acarbose (PRECOSE) 25 MG tablet Take 25 mg by mouth daily as needed (for low blood sugar).     Marland Kitchen alprazolam (XANAX) 2 MG tablet TAKE TWO TABLETS BY MOUTH TWICE DAILY AS NEEDED FOR ANXIETY AND/OR SLEEP 180 tablet 0  . Cholecalciferol (VITAMIN D-3) 5000 UNITS TABS Take 2 tablets by mouth daily.    . diclofenac (VOLTAREN) 75 MG EC tablet Take 75 mg by mouth 2 (two) times daily.    Marland Kitchen docusate sodium 100 MG CAPS Take 100 mg by mouth 2 (two) times daily. (Patient taking differently: Take 400 mg by mouth daily. ) 10 capsule 0  . furosemide (LASIX) 20 MG tablet Take 20 mg by mouth every morning.     Haynes Bast Combinations (NAIL STRENGTHENER/GELATIN) CAPS Take 1 capsule by mouth daily.    Marland Kitchen ibuprofen (ADVIL,MOTRIN) 800 MG tablet Take 800 mg by mouth every 8 (eight) hours as needed for moderate pain.     Marland Kitchen KLOR-CON M20 20 MEQ tablet Take 1 tablet by mouth daily.    Marland Kitchen levothyroxine (SYNTHROID, LEVOTHROID) 125 MCG tablet Take 250 mcg by mouth daily before breakfast.     . loratadine (CLARITIN) 10 MG tablet Take 1 tablet (10 mg total) by mouth 2 (two) times daily. (Patient taking differently: Take 20 mg by mouth 2 (two) times daily. )    . Magnesium 250 MG TABS Take 1 tablet by mouth daily.    . modafinil (PROVIGIL) 200 MG tablet Take 2 tablets (400 mg total) by mouth every morning. 60 tablet 5  . mometasone (NASONEX) 50 MCG/ACT nasal spray Place 2 sprays into the nose daily.    Marland Kitchen omeprazole  (PRILOSEC) 40 MG capsule Take 1 capsule (40 mg total) by mouth daily. 30 capsule 1  . Oxycodone HCl 10 MG TABS Take 10 mg by mouth every 4 (four) hours as needed (pain).     Marland Kitchen oxymorphone (OPANA ER) 30 MG 12 hr tablet Take 1 tablet (30 mg total) by mouth every 12 (twelve) hours. 10 tablet 0  . PARoxetine (PAXIL) 20 MG tablet Take 20 mg by mouth 2 (two) times daily.    . promethazine (PHENERGAN) 25 MG tablet Take 1 tablet by mouth every 6 (six) hours as needed for nausea.     . SUMAtriptan (IMITREX) 100 MG tablet Take 100 mg by mouth every 2 (two) hours as needed for migraine.     Marland Kitchen tiZANidine (ZANAFLEX) 2 MG tablet Take 1 tablet (2 mg total) by mouth 3 (three) times daily. (Patient taking differently: Take 2 mg by mouth 2 (two) times daily as needed. ) 90 tablet 1  .  topiramate (TOPAMAX) 200 MG tablet Take 1 tablet (200 mg total) by mouth at bedtime. 30 tablet 0  . valACYclovir (VALTREX) 1000 MG tablet Take 1,000 mg by mouth daily as needed (for flare ups).      No facility-administered medications prior to visit.    PAST MEDICAL HISTORY: Past Medical History  Diagnosis Date  . Osteoporosis   . Hypothyroidism   . Exogenous obesity   . Rhinitis   . Arthritis   . Fibromyalgia   . Somnolence   . Sleep apnea   . Anemia, iron deficiency 04/06/2011  . Pernicious anemia 04/06/2011  . Depression   . Anxiety   . Iron deficiency anemia, unspecified 12/12/2012  . Iron deficiency anemia due to chronic blood loss 01/15/2014  . GERD (gastroesophageal reflux disease)   . GIB (gastrointestinal bleeding)   . H/O gastric bypass   . Common migraine with intractable migraine 06/12/2014  . Gait disorder 06/12/2014  . Diabetes mellitus     denies 11/13/2014  . Hypoglycemia   . Vitamin D deficiency   . Hyponatremia   . Hypotension     PAST SURGICAL HISTORY: Past Surgical History  Procedure Laterality Date  . Gastric bypass    . Cholecystectomy    . Tracheostomy    . Uvulectomy    . Tonsillectomy      . Mandible reconstruction    . Spinal fusion      2 cervical fusion  . Dilation and curettage of uterus    . Cesarean section      FAMILY HISTORY: Family History  Problem Relation Age of Onset  . CAD Father   . Diabetes Father   . CAD Brother      X3  . Hypertension Brother   . Colon cancer Brother     SOCIAL HISTORY: Social History   Social History  . Marital Status: Married    Spouse Name: N/A  . Number of Children: 1  . Years of Education: hs   Occupational History  . disability    Social History Main Topics  . Smoking status: Never Smoker   . Smokeless tobacco: Never Used     Comment: never used tobacco  . Alcohol Use: No     Comment: one mixed drink twice yearly  . Drug Use: No     Comment: never used illicit drugs  . Sexual Activity: No   Other Topics Concern  . Not on file   Social History Narrative   Patient drinks 3-4 cups of caffeine daily.   Patient is left handed.     PHYSICAL EXAM  Filed Vitals:   03/11/15 1336  Height: 5\' 7"  (1.702 m)  Weight: 189 lb 3.2 oz (85.821 kg)   Body mass index is 29.63 kg/(m^2). General: The patient is alert and cooperative at the time of the examination. Skin: No significant peripheral edema is noted.  Neurologic Exam Mental status: The patient is alert and oriented x 3 at the time of the examination. The patient has apparent normal recent and remote memory, with an apparently normal attention span and concentration ability. Cranial nerves: Facial symmetry is present. Speech is normal, no aphasia or dysarthria is noted. Extraocular movements are full. Visual fields are full. Motor: The patient has good strength in the upper extremities.4/5 both lower extremities Sensory examination: Soft touch sensation is symmetric on the face, arms, and legs. Coordination: The patient has good finger-nose-finger and heel-to-shin bilaterally. Gait and station: The patient has a normal gait.  Tandem gait is slightly unsteady.  Romberg is negative. No drift is seen. Reflexes: Deep tendon reflexes are symmetric upper and lower DIAGNOSTIC DATA (LABS, IMAGING, TESTING) - I reviewed patient records, labs, notes, testing and imaging myself where available.  Lab Results  Component Value Date   WBC 3.2* 01/20/2015   HGB 11.4* 01/20/2015   HCT 36.1 01/20/2015   MCV 101 01/20/2015   PLT 221 01/20/2015      Component Value Date/Time   NA 134* 01/20/2015 1231   NA 127* 11/13/2014 2158   NA 135 10/16/2014 1318   K 3.8 01/20/2015 1231   K 4.1 11/13/2014 2158   K 4.1 10/16/2014 1318   CL 95* 11/13/2014 2158   CL 103 10/16/2014 1318   CO2 23 01/20/2015 1231   CO2 23 11/13/2014 2158   CO2 22 10/16/2014 1318   GLUCOSE 75 01/20/2015 1231   GLUCOSE 82 11/13/2014 2158   GLUCOSE 82 10/16/2014 1318   BUN 16.2 01/20/2015 1231   BUN 24* 11/13/2014 2158   BUN 22 10/16/2014 1318   CREATININE 0.8 01/20/2015 1231   CREATININE 0.75 11/13/2014 2158   CREATININE 0.9 10/16/2014 1318   CALCIUM 8.6 01/20/2015 1231   CALCIUM 8.6* 11/13/2014 2158   CALCIUM 8.8 10/16/2014 1318   PROT 6.7 01/20/2015 1231   PROT 6.6 11/13/2014 2158   PROT 6.7 10/16/2014 1318   ALBUMIN 3.7 01/20/2015 1231   ALBUMIN 4.0 11/13/2014 2158   ALBUMIN 3.5 10/16/2014 1318   AST 26 01/20/2015 1231   AST 24 11/13/2014 2158   AST 36 10/16/2014 1318   ALT 41 01/20/2015 1231   ALT 28 11/13/2014 2158   ALT 42 10/16/2014 1318   ALKPHOS 78 01/20/2015 1231   ALKPHOS 56 11/13/2014 2158   ALKPHOS 68 10/16/2014 1318   BILITOT <0.30 01/20/2015 1231   BILITOT 0.4 11/13/2014 2158   BILITOT 0.40 10/16/2014 1318   GFRNONAA >60 11/13/2014 2158   GFRAA >60 11/13/2014 2158    Lab Results  Component Value Date   VITAMINB12 317 01/20/2015       ASSESSMENT AND PLAN  55 y.o. year old female  has a past medical history of intractable migraine and gait disorder with frequent falls. She has multiple medical issues. Increase tizanidine to 4 mg up to 3 times a  day when necessary take 1 dose at night  PLAN Continue Topamax 200 mg for migraine Continue Imitrex acutely Set up for home physical therapy, it is important to continue home exercise program after therapy has concluded continue to use cane Follow-up in 6 months Nilda Riggs, [facility], [facility], APRN  [facility] [address], [address] [place], [place] [address] [phone]

## 2015-03-11 NOTE — Patient Instructions (Addendum)
Continue tizanidine to 4 mg up to 3 times a day when necessary take 1 dose at night, this is prescribed by Dr. Charlett Blake Continue Topamax 200 mg for migraine Continue Imitrex acutely Set up for home physical therapy, it is important to continue home exercise program after therapy has concluded Follow-up in 6 months

## 2015-03-23 ENCOUNTER — Telehealth: Payer: Self-pay | Admitting: Neurology

## 2015-03-23 NOTE — Telephone Encounter (Signed)
Called pt back and she stated she fractured her lower back twice. She thinks Valerie Silva misunderstood the cause of her weakness in legs. She states her back pain is causing problem with weakness. She is receiving cortisone shots in back from orthopaedic doctor and she states she has no cartilage in her right knee. She wants to hold off on PT right now because she does not think it will help at this point. She has done PT prior. 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 She is waiting to go to the pain clinic since her orthopaedic doctor cannot prescribe her pain meds anymore at the dose she was at. She is supposed to go on Wed. She takes her dog out on a walk everyday and uses her cane. She is in the process of moving out of an apt to a new one. Advised Dr Valerie Silva out the the office until Wed. Told her to call back to let us know if she is going to continue with PT or not. Gave her Valerie IdeLia Silva number for her records. She wrote number down. She verbalized understanding.  Pt stated she spoke to Valerie last Sunday and told her to call back in a week (yesterday) to schedule PT. She has had a death in the family. She wanted to wait until things settled down.   Called and LVM for Valerie. Relayed I spoke to pt and pt going to hold off on contacting her and scheduling PT. Told her I gave pt her number just in case.

## 2015-03-23 NOTE — Telephone Encounter (Signed)
Greggory BrandyLia Silva [phone] called to advise, referral was received for PT however patient is refusing therapy, 1st time patient stated, death in family, Greggory BrandyLia called patient today and patient is denying that she is the patient, Greggory BrandyLia double checked number and this was the same person Greggory BrandyLia spoke with the 1st time.

## 2015-04-20 ENCOUNTER — Ambulatory Visit: Payer: Medicare Other

## 2015-04-20 ENCOUNTER — Ambulatory Visit: Payer: Medicare Other | Admitting: Hematology & Oncology

## 2015-04-20 ENCOUNTER — Other Ambulatory Visit: Payer: Medicare Other

## 2015-04-29 ENCOUNTER — Ambulatory Visit: Payer: Medicare Other

## 2015-04-29 ENCOUNTER — Ambulatory Visit: Payer: Medicare Other | Admitting: Family

## 2015-04-29 ENCOUNTER — Ambulatory Visit (HOSPITAL_BASED_OUTPATIENT_CLINIC_OR_DEPARTMENT_OTHER): Payer: Medicare Other

## 2015-04-29 ENCOUNTER — Other Ambulatory Visit (HOSPITAL_BASED_OUTPATIENT_CLINIC_OR_DEPARTMENT_OTHER): Payer: Medicare Other

## 2015-04-29 VITALS — BP 108/72 | HR 76 | Temp 97.8°F | Resp 18

## 2015-04-29 DIAGNOSIS — D500 Iron deficiency anemia secondary to blood loss (chronic): Secondary | ICD-10-CM

## 2015-04-29 DIAGNOSIS — F064 Anxiety disorder due to known physiological condition: Secondary | ICD-10-CM

## 2015-04-29 DIAGNOSIS — D510 Vitamin B12 deficiency anemia due to intrinsic factor deficiency: Secondary | ICD-10-CM

## 2015-04-29 LAB — CBC WITH DIFFERENTIAL (CANCER CENTER ONLY)
BASO#: 0 10*3/uL (ref 0.0–0.2)
BASO%: 0.2 % (ref 0.0–2.0)
EOS ABS: 0.1 10*3/uL (ref 0.0–0.5)
EOS%: 2.8 % (ref 0.0–7.0)
HEMATOCRIT: 37.9 % (ref 34.8–46.6)
HGB: 12.2 g/dL (ref 11.6–15.9)
LYMPH#: 1.3 10*3/uL (ref 0.9–3.3)
LYMPH%: 27.9 % (ref 14.0–48.0)
MCH: 31.9 pg (ref 26.0–34.0)
MCHC: 32.2 g/dL (ref 32.0–36.0)
MCV: 99 fL (ref 81–101)
MONO#: 0.4 10*3/uL (ref 0.1–0.9)
MONO%: 7.8 % (ref 0.0–13.0)
NEUT#: 2.8 10*3/uL (ref 1.5–6.5)
NEUT%: 61.3 % (ref 39.6–80.0)
Platelets: 205 10*3/uL (ref 145–400)
RBC: 3.83 10*6/uL (ref 3.70–5.32)
RDW: 13 % (ref 11.1–15.7)
WBC: 4.6 10*3/uL (ref 3.9–10.0)

## 2015-04-29 LAB — IRON AND TIBC
%SAT: 52 % (ref 21–57)
IRON: 146 ug/dL — AB (ref 41–142)
TIBC: 280 ug/dL (ref 236–444)
UIBC: 134 ug/dL (ref 120–384)

## 2015-04-29 LAB — COMPREHENSIVE METABOLIC PANEL
ALBUMIN: 3.9 g/dL (ref 3.5–5.0)
ALK PHOS: 61 U/L (ref 40–150)
ALT: 47 U/L (ref 0–55)
AST: 36 U/L — ABNORMAL HIGH (ref 5–34)
Anion Gap: 8 mEq/L (ref 3–11)
BILIRUBIN TOTAL: 0.4 mg/dL (ref 0.20–1.20)
BUN: 25.1 mg/dL (ref 7.0–26.0)
CO2: 22 meq/L (ref 22–29)
CREATININE: 1 mg/dL (ref 0.6–1.1)
Calcium: 9 mg/dL (ref 8.4–10.4)
Chloride: 108 mEq/L (ref 98–109)
EGFR: 63 mL/min/{1.73_m2} — AB (ref >90)
GLUCOSE: 87 mg/dL (ref 70–140)
Potassium: 3.3 mEq/L — ABNORMAL LOW (ref 3.5–5.1)
SODIUM: 138 meq/L (ref 136–145)
TOTAL PROTEIN: 7.1 g/dL (ref 6.4–8.3)

## 2015-04-29 LAB — FERRITIN: FERRITIN: 569 ng/mL — AB (ref 9–269)

## 2015-04-29 MED ORDER — CYANOCOBALAMIN 1000 MCG/ML IJ SOLN
INTRAMUSCULAR | Status: AC
  Filled 2015-04-29: qty 1

## 2015-04-29 MED ORDER — CYANOCOBALAMIN 1000 MCG/ML IJ SOLN
1000.0000 ug | Freq: Once | INTRAMUSCULAR | Status: AC
  Administered 2015-04-29: 1000 ug via INTRAMUSCULAR

## 2015-04-29 NOTE — Patient Instructions (Signed)

## 2015-04-30 ENCOUNTER — Telehealth: Payer: Self-pay | Admitting: Nurse Practitioner

## 2015-04-30 NOTE — Telephone Encounter (Addendum)
LVM on patient's personal machine. ----- Message from Josph MachoPeter R Ennever, MD sent at 04/29/2015  1:57 PM EDT ----- Call - iron level is great!!  Valerie Silva

## 2015-05-11 ENCOUNTER — Other Ambulatory Visit: Payer: Self-pay | Admitting: Nurse Practitioner

## 2015-05-11 DIAGNOSIS — D510 Vitamin B12 deficiency anemia due to intrinsic factor deficiency: Secondary | ICD-10-CM

## 2015-05-11 DIAGNOSIS — D500 Iron deficiency anemia secondary to blood loss (chronic): Secondary | ICD-10-CM

## 2015-05-11 DIAGNOSIS — E871 Hypo-osmolality and hyponatremia: Secondary | ICD-10-CM

## 2015-05-12 ENCOUNTER — Ambulatory Visit (HOSPITAL_BASED_OUTPATIENT_CLINIC_OR_DEPARTMENT_OTHER): Payer: Medicare Other

## 2015-05-12 ENCOUNTER — Other Ambulatory Visit (HOSPITAL_BASED_OUTPATIENT_CLINIC_OR_DEPARTMENT_OTHER): Payer: Medicare Other

## 2015-05-12 ENCOUNTER — Encounter: Payer: Self-pay | Admitting: Hematology & Oncology

## 2015-05-12 ENCOUNTER — Ambulatory Visit (HOSPITAL_BASED_OUTPATIENT_CLINIC_OR_DEPARTMENT_OTHER): Payer: Medicare Other | Admitting: Hematology & Oncology

## 2015-05-12 VITALS — BP 101/71 | HR 89 | Resp 16 | Ht 67.0 in | Wt 192.0 lb

## 2015-05-12 DIAGNOSIS — Z9884 Bariatric surgery status: Secondary | ICD-10-CM

## 2015-05-12 DIAGNOSIS — D500 Iron deficiency anemia secondary to blood loss (chronic): Secondary | ICD-10-CM

## 2015-05-12 DIAGNOSIS — F419 Anxiety disorder, unspecified: Secondary | ICD-10-CM | POA: Diagnosis not present

## 2015-05-12 DIAGNOSIS — E538 Deficiency of other specified B group vitamins: Secondary | ICD-10-CM

## 2015-05-12 DIAGNOSIS — D509 Iron deficiency anemia, unspecified: Secondary | ICD-10-CM

## 2015-05-12 DIAGNOSIS — D510 Vitamin B12 deficiency anemia due to intrinsic factor deficiency: Secondary | ICD-10-CM

## 2015-05-12 DIAGNOSIS — F064 Anxiety disorder due to known physiological condition: Secondary | ICD-10-CM

## 2015-05-12 DIAGNOSIS — E611 Iron deficiency: Secondary | ICD-10-CM | POA: Diagnosis not present

## 2015-05-12 DIAGNOSIS — R52 Pain, unspecified: Secondary | ICD-10-CM | POA: Diagnosis not present

## 2015-05-12 DIAGNOSIS — E871 Hypo-osmolality and hyponatremia: Secondary | ICD-10-CM

## 2015-05-12 LAB — CMP (CANCER CENTER ONLY)
ALBUMIN: 3.4 g/dL (ref 3.3–5.5)
ALK PHOS: 59 U/L (ref 26–84)
ALT: 60 U/L — AB (ref 10–47)
AST: 33 U/L (ref 11–38)
BILIRUBIN TOTAL: 0.6 mg/dL (ref 0.20–1.60)
BUN, Bld: 25 mg/dL — ABNORMAL HIGH (ref 7–22)
CALCIUM: 8.6 mg/dL (ref 8.0–10.3)
CO2: 25 mEq/L (ref 18–33)
Chloride: 105 mEq/L (ref 98–108)
Creat: 0.9 mg/dl (ref 0.6–1.2)
GLUCOSE: 119 mg/dL — AB (ref 73–118)
POTASSIUM: 3.5 meq/L (ref 3.3–4.7)
Sodium: 140 mEq/L (ref 128–145)
TOTAL PROTEIN: 6.8 g/dL (ref 6.4–8.1)

## 2015-05-12 LAB — CBC WITH DIFFERENTIAL (CANCER CENTER ONLY)
BASO#: 0 10*3/uL (ref 0.0–0.2)
BASO%: 0.4 % (ref 0.0–2.0)
EOS%: 3 % (ref 0.0–7.0)
Eosinophils Absolute: 0.2 10*3/uL (ref 0.0–0.5)
HEMATOCRIT: 35.9 % (ref 34.8–46.6)
HEMOGLOBIN: 11.7 g/dL (ref 11.6–15.9)
LYMPH#: 1.2 10*3/uL (ref 0.9–3.3)
LYMPH%: 23.4 % (ref 14.0–48.0)
MCH: 32.5 pg (ref 26.0–34.0)
MCHC: 32.6 g/dL (ref 32.0–36.0)
MCV: 100 fL (ref 81–101)
MONO#: 0.3 10*3/uL (ref 0.1–0.9)
MONO%: 5.8 % (ref 0.0–13.0)
NEUT%: 67.4 % (ref 39.6–80.0)
NEUTROS ABS: 3.6 10*3/uL (ref 1.5–6.5)
Platelets: 183 10*3/uL (ref 145–400)
RBC: 3.6 10*6/uL — ABNORMAL LOW (ref 3.70–5.32)
RDW: 12.7 % (ref 11.1–15.7)
WBC: 5.3 10*3/uL (ref 3.9–10.0)

## 2015-05-12 MED ORDER — SODIUM CHLORIDE 0.9 % IV SOLN
Freq: Once | INTRAVENOUS | Status: AC
  Administered 2015-05-12: 13:00:00 via INTRAVENOUS

## 2015-05-12 MED ORDER — HYDROMORPHONE HCL 4 MG/ML IJ SOLN
2.0000 mg | INTRAMUSCULAR | Status: DC | PRN
  Administered 2015-05-12: 2 mg via INTRAVENOUS

## 2015-05-12 MED ORDER — HYDROMORPHONE HCL 1 MG/ML IJ SOLN
INTRAMUSCULAR | Status: AC
  Filled 2015-05-12: qty 2

## 2015-05-12 MED ORDER — CYANOCOBALAMIN 1000 MCG/ML IJ SOLN
INTRAMUSCULAR | Status: AC
  Filled 2015-05-12: qty 1

## 2015-05-12 MED ORDER — ALPRAZOLAM 2 MG PO TABS: ORAL_TABLET | ORAL | Status: DC

## 2015-05-12 MED ORDER — CYANOCOBALAMIN 1000 MCG/ML IJ SOLN
1000.0000 ug | Freq: Once | INTRAMUSCULAR | Status: AC
  Administered 2015-05-12: 1000 ug via INTRAMUSCULAR

## 2015-05-12 NOTE — Patient Instructions (Signed)

## 2015-05-12 NOTE — Progress Notes (Signed)
Hematology and Oncology Follow Up Visit  Valerie HongLaura J Silva [id number] 1960-08-11 55 y.o. 05/12/2015   Principle Diagnosis:   Intermittent iron deficiency anemia  Vitamin B12 deficiency  Gastric bypass  Severe anxiety  Current Therapy:    IV iron as indicated  Vitamin B12 1 mg IM q. 3 months     Interim History:  Valerie Silva is back for followup. As usual, she is really having a very difficult time with her son. Apparently, he was one who inherited her ex-husband's money. This was about $500,000. She days that he is going through this very quickly. She thinks a lot of this is on drugs.  She really is not able to talk with him. He really is "blinded" by the money that he received. He is not going to give her any of it.  She is having issues with pain clinics. She has chronic pain area and she has bad arthritis. She has been going to different pain clinics. Apparently, there has been issues with the staff at certain pain clinics. She does not feel that she is retreated like a "drug addict". I told her that this is the "world" that we are in. A lot of pain clinics have to be very careful with her today given pain medicine to and a significant amount of patients to go to these pain clinics to abuse her medications and sell them.. It is just a reality of the current day. I think she may have a better understanding of this.  She has had no bleeding. She has had some weakness. She has had some falling episodes. She may have some element of neuropathy.  Her iron studies done about 2 weeks ago showed a ferritin of 569 with an iron saturation 52%.  Overall, her performance status is ECOG 2-3.   Medications:  Current outpatient prescriptions:  .  acarbose (PRECOSE) 25 MG tablet, Take 25 mg by mouth daily as needed (for low blood sugar). , Disp: , Rfl:  .  alprazolam (XANAX) 2 MG tablet, TAKE TWO TABLETS BY MOUTH TWICE DAILY AS NEEDED FOR ANXIETY AND/OR SLEEP, Disp: 120 tablet, Rfl: 0 .   Cholecalciferol (VITAMIN D-3) 5000 UNITS TABS, Take 2 tablets by mouth daily., Disp: , Rfl:  .  diclofenac (VOLTAREN) 75 MG EC tablet, Take 75 mg by mouth 2 (two) times daily., Disp: , Rfl:  .  docusate sodium 100 MG CAPS, Take 100 mg by mouth 2 (two) times daily. (Patient taking differently: Take 400 mg by mouth daily. ), Disp: 10 capsule, Rfl: 0 .  furosemide (LASIX) 20 MG tablet, Take 20 mg by mouth every morning. May take additional tablet prn in evening, Disp: , Rfl:  .  Gelatin-Mineral Combinations (NAIL STRENGTHENER/GELATIN) CAPS, Take 1 capsule by mouth daily., Disp: , Rfl:  .  ibuprofen (ADVIL,MOTRIN) 800 MG tablet, Take 800 mg by mouth every 8 (eight) hours as needed for moderate pain. , Disp: , Rfl:  .  KLOR-CON M20 20 MEQ tablet, Take 1 tablet by mouth daily., Disp: , Rfl:  .  levothyroxine (SYNTHROID, LEVOTHROID) 125 MCG tablet, Take 250 mcg by mouth daily before breakfast. , Disp: , Rfl:  .  loratadine (CLARITIN) 10 MG tablet, Take 1 tablet (10 mg total) by mouth 2 (two) times daily. (Patient taking differently: Take 20 mg by mouth 2 (two) times daily. ), Disp: , Rfl:  .  Magnesium 250 MG TABS, Take 1 tablet by mouth daily., Disp: , Rfl:  .  modafinil (  PROVIGIL) 200 MG tablet, Take 2 tablets (400 mg total) by mouth every morning., Disp: 60 tablet, Rfl: 5 .  mometasone (NASONEX) 50 MCG/ACT nasal spray, Place 2 sprays into the nose daily., Disp: , Rfl:  .  omeprazole (PRILOSEC) 40 MG capsule, Take 1 capsule (40 mg total) by mouth daily., Disp: 30 capsule, Rfl: 1 .  OPANA ER, CRUSH RESISTANT, 15 MG T12A, 15 mg., Disp: , Rfl:  .  Oxycodone HCl 10 MG TABS, Take 10 mg by mouth 3 (three) times daily. , Disp: , Rfl:  .  oxymorphone (OPANA ER) 30 MG 12 hr tablet, Take 1 tablet (30 mg total) by mouth every 12 (twelve) hours. (Patient taking differently: Take 15 mg by mouth every 12 (twelve) hours. ), Disp: 10 tablet, Rfl: 0 .  PARoxetine (PAXIL) 20 MG tablet, Take 20 mg by mouth 2 (two) times  daily., Disp: , Rfl:  .  predniSONE (DELTASONE) 10 MG tablet, Take 10 mg by mouth daily with breakfast., Disp: , Rfl:  .  promethazine (PHENERGAN) 25 MG tablet, Take 1 tablet by mouth every 6 (six) hours as needed for nausea. , Disp: , Rfl:  .  SUMAtriptan (IMITREX) 100 MG tablet, Take 1 tablet (100 mg total) by mouth every 2 (two) hours as needed for migraine., Disp: 10 tablet, Rfl: 6 .  tiZANidine (ZANAFLEX) 4 MG tablet, Take 4 mg by mouth every 6 (six) hours as needed for muscle spasms., Disp: , Rfl:  .  topiramate (TOPAMAX) 100 MG tablet, Take 100 mg by mouth 2 (two) times daily., Disp: , Rfl:  .  valACYclovir (VALTREX) 1000 MG tablet, Take 1,000 mg by mouth daily as needed (for flare ups). , Disp: , Rfl:   Current facility-administered medications:  .  HYDROmorphone (DILAUDID) injection 2 mg, 2 mg, Intravenous, Q4H PRN, Josph Macho, MD, 2 mg at 05/12/15 1331  Allergies:  Allergies  Allergen Reactions  . Ambien [Zolpidem Tartrate] Itching and Other (See Comments)    Pt states that this medication causes blackouts.   . Benazepril Hcl Other (See Comments)    Severe cough   . Codeine Other (See Comments)    Pt states that this medication causes rapid heartbeat.   . Iodine Other (See Comments)    Sensitive when used gynecologically (intra-vaginally).  . Iron Itching    Iron taken by mouth causes this reaction. The patient has had several iron infusions plus Feraheme that has not caused any side effects.   . Lovaza [Omega-3-Acid Ethyl Esters] Itching  . Soma [Carisoprodol] Other (See Comments)    Pt states that this medication causes panic attacks.   . Sulfonamide Derivatives Other (See Comments)    Sensitive when used intra-vaginally.  . Abilify [Aripiprazole] Other (See Comments)    Chest tightness  . Cymbalta [Duloxetine Hcl] Swelling  . Lunesta [Eszopiclone] Other (See Comments)    unknown  . Lyrica [Pregabalin]     Pt can not remember  . Penicillins Other (See Comments)     Severe family allergy.  Family physician long ago stated she shouldn't take this med since mother and brother nearly died from anaphylaxis.  . Sulfa Antibiotics Other (See Comments)    Pt unable to recall reaction - occurred as a child.  . Amitriptyline Other (See Comments)    Pt states that this medication causes sleep walking.     Past Medical History, Surgical history, Social history, and Family History were reviewed and updated.  Review of Systems: As above  Physical Exam:  height is 5\' 7"  (1.702 m) and weight is 192 lb (87.091 kg). Her blood pressure is 101/71 and her pulse is 89. Her respiration is 16.   Somewhat chronically ill-appearing white female. Head and neck exam shows no ocular or oral lesions. She has no palpable lymph nodes in her neck. Thyroid is not palpable. Lungs are clear. Cardiac exam shows a regular rate and rhythm with no murmurs, rubs or bruits.. Abdomen is soft. Has good bowel sounds. She has well-healed laparotomy scars. Back exam shows no tenderness over the spine. She does have healed laminectomy scars. Extremities shows some chronic trace edema in her legs. Skin exam shows no rashes, ecchymoses or petechia. Neurological exam is nonfocal.   Lab Results  Component Value Date   WBC 5.3 05/12/2015   HGB 11.7 05/12/2015   HCT 35.9 05/12/2015   MCV 100 05/12/2015   PLT 183 05/12/2015     Chemistry      Component Value Date/Time   NA 140 05/12/2015 1219   NA 138 04/29/2015 1021   NA 127* 11/13/2014 2158   K 3.5 05/12/2015 1219   K 3.3* 04/29/2015 1021   K 4.1 11/13/2014 2158   CL 105 05/12/2015 1219   CL 95* 11/13/2014 2158   CO2 25 05/12/2015 1219   CO2 22 04/29/2015 1021   CO2 23 11/13/2014 2158   BUN 25* 05/12/2015 1219   BUN 25.1 04/29/2015 1021   BUN 24* 11/13/2014 2158   CREATININE 0.9 05/12/2015 1219   CREATININE 1.0 04/29/2015 1021   CREATININE 0.75 11/13/2014 2158      Component Value Date/Time   CALCIUM 8.6 05/12/2015 1219   CALCIUM  9.0 04/29/2015 1021   CALCIUM 8.6* 11/13/2014 2158   ALKPHOS 59 05/12/2015 1219   ALKPHOS 61 04/29/2015 1021   ALKPHOS 56 11/13/2014 2158   AST 33 05/12/2015 1219   AST 36* 04/29/2015 1021   AST 24 11/13/2014 2158   ALT 60* 05/12/2015 1219   ALT 47 04/29/2015 1021   ALT 28 11/13/2014 2158   BILITOT 0.60 05/12/2015 1219   BILITOT 0.40 04/29/2015 1021   BILITOT 0.4 11/13/2014 2158         Impression and Plan: Valerie Silva is a 55 year old female. She has a history gastric bypass. She is iron deficient and B12 deficient.  As always, there is a lot of anxiety that she has. Her personal life is just in total disarray. she is having a lot of issues with other physicians. I told her that we really are not able to help her with the pain medication as she does not have cancer. She understands this.  Her anxiety level just is very significant. This is always been a problem for her. The Xanax that she gets does seem to help her. I will plan to get her back to see [place] in another 3 months.   Josph Macho, MD 5/2/20172:19 PM

## 2015-06-23 ENCOUNTER — Other Ambulatory Visit: Payer: Self-pay | Admitting: *Deleted

## 2015-06-23 DIAGNOSIS — D500 Iron deficiency anemia secondary to blood loss (chronic): Secondary | ICD-10-CM

## 2015-06-23 DIAGNOSIS — D510 Vitamin B12 deficiency anemia due to intrinsic factor deficiency: Secondary | ICD-10-CM

## 2015-06-23 DIAGNOSIS — F064 Anxiety disorder due to known physiological condition: Secondary | ICD-10-CM

## 2015-06-23 MED ORDER — ALPRAZOLAM 2 MG PO TABS: ORAL_TABLET | ORAL | Status: DC

## 2015-08-12 ENCOUNTER — Other Ambulatory Visit: Payer: Self-pay | Admitting: Hematology & Oncology

## 2015-08-12 DIAGNOSIS — D510 Vitamin B12 deficiency anemia due to intrinsic factor deficiency: Secondary | ICD-10-CM

## 2015-08-12 DIAGNOSIS — F064 Anxiety disorder due to known physiological condition: Secondary | ICD-10-CM

## 2015-08-12 DIAGNOSIS — D500 Iron deficiency anemia secondary to blood loss (chronic): Secondary | ICD-10-CM

## 2015-08-13 ENCOUNTER — Ambulatory Visit: Payer: Medicare Other

## 2015-08-13 ENCOUNTER — Ambulatory Visit: Payer: Medicare Other | Admitting: Hematology & Oncology

## 2015-08-13 ENCOUNTER — Other Ambulatory Visit: Payer: Medicare Other

## 2015-08-20 ENCOUNTER — Other Ambulatory Visit: Payer: Medicare Other

## 2015-08-20 ENCOUNTER — Ambulatory Visit: Payer: Medicare Other

## 2015-08-20 ENCOUNTER — Telehealth: Payer: Self-pay | Admitting: Hematology & Oncology

## 2015-08-20 ENCOUNTER — Ambulatory Visit: Payer: Medicare Other | Admitting: Hematology & Oncology

## 2015-08-20 NOTE — Telephone Encounter (Signed)
Patient called and resch her 08/20/15 missed apt to 09/03/15

## 2015-09-03 ENCOUNTER — Encounter: Payer: Self-pay | Admitting: Hematology & Oncology

## 2015-09-03 ENCOUNTER — Ambulatory Visit (HOSPITAL_BASED_OUTPATIENT_CLINIC_OR_DEPARTMENT_OTHER): Payer: Medicare Other

## 2015-09-03 ENCOUNTER — Ambulatory Visit (HOSPITAL_BASED_OUTPATIENT_CLINIC_OR_DEPARTMENT_OTHER): Payer: Medicare Other | Admitting: Hematology & Oncology

## 2015-09-03 ENCOUNTER — Other Ambulatory Visit (HOSPITAL_BASED_OUTPATIENT_CLINIC_OR_DEPARTMENT_OTHER): Payer: Medicare Other

## 2015-09-03 DIAGNOSIS — E611 Iron deficiency: Secondary | ICD-10-CM

## 2015-09-03 DIAGNOSIS — D500 Iron deficiency anemia secondary to blood loss (chronic): Secondary | ICD-10-CM

## 2015-09-03 DIAGNOSIS — E538 Deficiency of other specified B group vitamins: Secondary | ICD-10-CM | POA: Diagnosis not present

## 2015-09-03 DIAGNOSIS — F064 Anxiety disorder due to known physiological condition: Secondary | ICD-10-CM

## 2015-09-03 DIAGNOSIS — D510 Vitamin B12 deficiency anemia due to intrinsic factor deficiency: Secondary | ICD-10-CM

## 2015-09-03 DIAGNOSIS — Z9884 Bariatric surgery status: Secondary | ICD-10-CM

## 2015-09-03 LAB — CBC WITH DIFFERENTIAL (CANCER CENTER ONLY)
BASO#: 0 10*3/uL (ref 0.0–0.2)
BASO%: 0.4 % (ref 0.0–2.0)
EOS%: 2.8 % (ref 0.0–7.0)
Eosinophils Absolute: 0.2 10*3/uL (ref 0.0–0.5)
HEMATOCRIT: 38.5 % (ref 34.8–46.6)
HGB: 13 g/dL (ref 11.6–15.9)
LYMPH#: 1.6 10*3/uL (ref 0.9–3.3)
LYMPH%: 28.8 % (ref 14.0–48.0)
MCH: 31.9 pg (ref 26.0–34.0)
MCHC: 33.8 g/dL (ref 32.0–36.0)
MCV: 95 fL (ref 81–101)
MONO#: 0.3 10*3/uL (ref 0.1–0.9)
MONO%: 5.6 % (ref 0.0–13.0)
NEUT#: 3.4 10*3/uL (ref 1.5–6.5)
NEUT%: 62.4 % (ref 39.6–80.0)
Platelets: 201 10*3/uL (ref 145–400)
RBC: 4.07 10*6/uL (ref 3.70–5.32)
RDW: 12.6 % (ref 11.1–15.7)
WBC: 5.4 10*3/uL (ref 3.9–10.0)

## 2015-09-03 LAB — RETICULOCYTES: RETICULOCYTE COUNT: 1.3 % (ref 0.6–2.6)

## 2015-09-03 MED ORDER — CYANOCOBALAMIN 1000 MCG/ML IJ SOLN
1000.0000 ug | Freq: Once | INTRAMUSCULAR | Status: AC
  Administered 2015-09-03: 1000 ug via INTRAMUSCULAR

## 2015-09-03 MED ORDER — CYANOCOBALAMIN 1000 MCG/ML IJ SOLN
INTRAMUSCULAR | Status: AC
  Filled 2015-09-03: qty 1

## 2015-09-03 MED ORDER — ALPRAZOLAM 2 MG PO TABS
ORAL_TABLET | ORAL | 0 refills | Status: DC
Start: 2015-09-03 — End: 2015-10-05

## 2015-09-03 NOTE — Progress Notes (Signed)
Hematology and Oncology Follow Up Visit  Valerie HongLaura J Silva [id number] Oct 24, 1960 55 y.o. 09/03/2015   Principle Diagnosis:   Intermittent iron deficiency anemia  Vitamin B12 deficiency  Gastric bypass  Severe anxiety  Current Therapy:    IV iron as indicated  Vitamin B12 1 mg IM q. 3 months     Interim History:  [name] is back for followup. Unfortunately, she fell a month ago and fractured her right humerus. She is seen with pink surgery for this. She has some shoulder issues. She will go for an MRI tomorrow.  It sounds like things with her son might be a little better. She has not had any problems with him. This is a blessing.  She is having some pain issues. Again this is from the right arm. Was up a surgery is helping with this..  Back in April when last saw her, her iron stay showed a ferritin of 569 with iron saturation of 52%.  She did have a lot of bruising with the fracture. There is no obvious bleeding.   Overall, her performance status is ECOG 2-3.   Medications:  Current Outpatient Prescriptions:  .  acarbose (PRECOSE) 25 MG tablet, Take 25 mg by mouth daily as needed (for low blood sugar). , Disp: , Rfl:  .  alprazolam (XANAX) 2 MG tablet, TAKE TWO TABLETS BY MOUTH TWICE DAILY AS NEEDED FOR ANXIETY AND/OR  SLEEP, Disp: 120 tablet, Rfl: 0 .  Cholecalciferol (VITAMIN D-3) 5000 UNITS TABS, Take 2 tablets by mouth daily., Disp: , Rfl:  .  diclofenac (VOLTAREN) 75 MG EC tablet, Take 75 mg by mouth 2 (two) times daily., Disp: , Rfl:  .  docusate sodium 100 MG CAPS, Take 100 mg by mouth 2 (two) times daily. (Patient taking differently: Take 400 mg by mouth daily. ), Disp: 10 capsule, Rfl: 0 .  furosemide (LASIX) 20 MG tablet, Take 20 mg by mouth every morning. May take additional tablet prn in evening, Disp: , Rfl:  .  Gelatin-Mineral Combinations (NAIL STRENGTHENER/GELATIN) CAPS, Take 1 capsule by mouth daily., Disp: , Rfl:  .  ibuprofen (ADVIL,MOTRIN) 800 MG  tablet, Take 800 mg by mouth every 8 (eight) hours as needed for moderate pain. , Disp: , Rfl:  .  KLOR-CON M20 20 MEQ tablet, Take 1 tablet by mouth daily., Disp: , Rfl:  .  levothyroxine (SYNTHROID, LEVOTHROID) 125 MCG tablet, Take 250 mcg by mouth daily before breakfast. , Disp: , Rfl:  .  loratadine (CLARITIN) 10 MG tablet, Take 1 tablet (10 mg total) by mouth 2 (two) times daily. (Patient taking differently: Take 20 mg by mouth 2 (two) times daily. ), Disp: , Rfl:  .  Magnesium 250 MG TABS, Take 1 tablet by mouth daily., Disp: , Rfl:  .  modafinil (PROVIGIL) 200 MG tablet, Take 2 tablets (400 mg total) by mouth every morning., Disp: 60 tablet, Rfl: 5 .  mometasone (NASONEX) 50 MCG/ACT nasal spray, Place 2 sprays into the nose daily., Disp: , Rfl:  .  omeprazole (PRILOSEC) 40 MG capsule, Take 1 capsule (40 mg total) by mouth daily., Disp: 30 capsule, Rfl: 1 .  Oxycodone HCl 10 MG TABS, Take 10 mg by mouth 3 (three) times daily. , Disp: , Rfl:  .  oxymorphone (OPANA ER) 20 MG 12 hr tablet, , Disp: , Rfl:  .  PARoxetine (PAXIL) 20 MG tablet, Take 20 mg by mouth 2 (two) times daily., Disp: , Rfl:  .  promethazine (  PHENERGAN) 25 MG tablet, Take 1 tablet by mouth every 6 (six) hours as needed for nausea. , Disp: , Rfl:  .  SUMAtriptan (IMITREX) 100 MG tablet, Take 1 tablet (100 mg total) by mouth every 2 (two) hours as needed for migraine., Disp: 10 tablet, Rfl: 6 .  tiZANidine (ZANAFLEX) 4 MG tablet, Take 4 mg by mouth every 6 (six) hours as needed for muscle spasms., Disp: , Rfl:  .  topiramate (TOPAMAX) 100 MG tablet, Take 100 mg by mouth 2 (two) times daily., Disp: , Rfl:  .  valACYclovir (VALTREX) 1000 MG tablet, Take 1,000 mg by mouth daily as needed (for flare ups). , Disp: , Rfl:  No current facility-administered medications for this visit.   Facility-Administered Medications Ordered in Other Visits:  .  cyanocobalamin ((VITAMIN B-12)) injection 1,000 mcg, 1,000 mcg, Intramuscular, Once,  Josph MachoPeter R Ennever, MD  Allergies:  Allergies  Allergen Reactions  . Ambien [Zolpidem Tartrate] Itching and Other (See Comments)    Pt states that this medication causes blackouts.   . Benazepril Hcl Other (See Comments)    Severe cough   . Codeine Other (See Comments)    Pt states that this medication causes rapid heartbeat.   . Iodine Other (See Comments)    Sensitive when used gynecologically (intra-vaginally).  . Iron Itching    Iron taken by mouth causes this reaction. The patient has had several iron infusions plus Feraheme that has not caused any side effects.   . Lovaza [Omega-3-Acid Ethyl Esters] Itching  . Soma [Carisoprodol] Other (See Comments)    Pt states that this medication causes panic attacks.   . Sulfonamide Derivatives Other (See Comments)    Sensitive when used intra-vaginally.  . Abilify [Aripiprazole] Other (See Comments)    Chest tightness  . Cymbalta [Duloxetine Hcl] Swelling  . Lunesta [Eszopiclone] Other (See Comments)    unknown  . Lyrica [Pregabalin]     Pt can not remember  . Penicillins Other (See Comments)    Severe family allergy.  Family physician long ago stated she shouldn't take this med since mother and brother nearly died from anaphylaxis.  . Sulfa Antibiotics Other (See Comments)    Pt unable to recall reaction - occurred as a child.  . Amitriptyline Other (See Comments)    Pt states that this medication causes sleep walking.     Past Medical History, Surgical history, Social history, and Family History were reviewed and updated.  Review of Systems: As above  Physical Exam:  height is 5\' 7"  (1.702 m) and weight is 193 lb (87.5 kg). Her oral temperature is 97.9 F (36.6 C). Her blood pressure is 93/73 and her pulse is 87. Her respiration is 18.   Somewhat chronically ill-appearing white female. Head and neck exam shows no ocular or oral lesions. She has no palpable lymph nodes in her neck. Thyroid is not palpable. Lungs are clear.  Cardiac exam shows a regular rate and rhythm with no murmurs, rubs or bruits.. Abdomen is soft. Has good bowel sounds. She has well-healed laparotomy scars. Back exam shows no tenderness over the spine. She does have healed laminectomy scars. Extremities shows that she is wearing a sling for the right arm. The shoulder socket is clearly out of place on the right side. She has some muscle atrophy in the right shoulder. There is some chronic trace edema in her legs. Skin exam shows no rashes, ecchymoses or petechia. Neurological exam is nonfocal.   Lab Results  Component Value Date   WBC 5.4 09/03/2015   HGB 13.0 09/03/2015   HCT 38.5 09/03/2015   MCV 95 09/03/2015   PLT 201 09/03/2015     Chemistry      Component Value Date/Time   NA 140 05/12/2015 1219   NA 138 04/29/2015 1021   K 3.5 05/12/2015 1219   K 3.3 (L) 04/29/2015 1021   CL 105 05/12/2015 1219   CO2 25 05/12/2015 1219   CO2 22 04/29/2015 1021   BUN 25 (H) 05/12/2015 1219   BUN 25.1 04/29/2015 1021   CREATININE 0.9 05/12/2015 1219   CREATININE 1.0 04/29/2015 1021      Component Value Date/Time   CALCIUM 8.6 05/12/2015 1219   CALCIUM 9.0 04/29/2015 1021   ALKPHOS 59 05/12/2015 1219   ALKPHOS 61 04/29/2015 1021   AST 33 05/12/2015 1219   AST 36 (H) 04/29/2015 1021   ALT 60 (H) 05/12/2015 1219   ALT 47 04/29/2015 1021   BILITOT 0.60 05/12/2015 1219   BILITOT 0.40 04/29/2015 1021         Impression and Plan: Valerie Silva is a 55 year old female. She has a history gastric bypass. She is iron deficient and B12 deficient.  She now has this broken right humerus. It looks like she has right shoulder issues. She may have toll  undergo shoulder reconstruction. She goes for MRI of the shoulder tomorrow. If she needs surgery, I do not see any problem from my point of view.   I will going give her vitamin B-12 today. We'll see what her iron levels show. I would be surprised if she needed iron given how good her blood count is.   I will plan to get her back to see [place] in another 3 months.   Josph Macho, MD 8/24/20172:28 PM

## 2015-09-04 ENCOUNTER — Telehealth: Payer: Self-pay

## 2015-09-04 LAB — IRON AND TIBC
%SAT: 22 % (ref 21–57)
Iron: 60 ug/dL (ref 41–142)
TIBC: 278 ug/dL (ref 236–444)
UIBC: 218 ug/dL (ref 120–384)

## 2015-09-04 LAB — FERRITIN: Ferritin: 546 ng/ml — ABNORMAL HIGH (ref 9–269)

## 2015-09-04 NOTE — Telephone Encounter (Addendum)
-----   Message from Josph MachoPeter R Ennever, MD sent at 09/04/2015 10:20 AM EDT ----- Call - iron level is ok!!!  pete   Above message given to pt via phone. Pt verbalizes understanding and appreciation. dph

## 2015-09-15 ENCOUNTER — Other Ambulatory Visit: Payer: Self-pay | Admitting: Orthopedic Surgery

## 2015-09-15 DIAGNOSIS — M25511 Pain in right shoulder: Secondary | ICD-10-CM

## 2015-09-17 ENCOUNTER — Ambulatory Visit: Payer: Medicare Other | Admitting: Nurse Practitioner

## 2015-09-20 ENCOUNTER — Other Ambulatory Visit: Payer: Medicare Other

## 2015-09-23 ENCOUNTER — Other Ambulatory Visit: Payer: Medicare Other

## 2015-09-30 ENCOUNTER — Ambulatory Visit
Admission: RE | Admit: 2015-09-30 | Discharge: 2015-09-30 | Disposition: A | Discharge status: Home / Self Care | Payer: Medicare Other | Source: Ambulatory Visit | Attending: Orthopedic Surgery | Admitting: Orthopedic Surgery

## 2015-09-30 DIAGNOSIS — M25511 Pain in right shoulder: Secondary | ICD-10-CM

## 2015-10-05 ENCOUNTER — Other Ambulatory Visit: Payer: Self-pay | Admitting: *Deleted

## 2015-10-05 DIAGNOSIS — D510 Vitamin B12 deficiency anemia due to intrinsic factor deficiency: Secondary | ICD-10-CM

## 2015-10-05 DIAGNOSIS — F064 Anxiety disorder due to known physiological condition: Secondary | ICD-10-CM

## 2015-10-05 DIAGNOSIS — D500 Iron deficiency anemia secondary to blood loss (chronic): Secondary | ICD-10-CM

## 2015-10-05 MED ORDER — ALPRAZOLAM 2 MG PO TABS: ORAL_TABLET | ORAL | 0 refills | Status: DC

## 2015-11-02 ENCOUNTER — Other Ambulatory Visit: Payer: Self-pay | Admitting: Orthopedic Surgery

## 2015-11-02 ENCOUNTER — Other Ambulatory Visit: Payer: Self-pay | Admitting: *Deleted

## 2015-11-02 DIAGNOSIS — F064 Anxiety disorder due to known physiological condition: Secondary | ICD-10-CM

## 2015-11-02 DIAGNOSIS — M25511 Pain in right shoulder: Secondary | ICD-10-CM

## 2015-11-02 DIAGNOSIS — D500 Iron deficiency anemia secondary to blood loss (chronic): Secondary | ICD-10-CM

## 2015-11-02 DIAGNOSIS — D510 Vitamin B12 deficiency anemia due to intrinsic factor deficiency: Secondary | ICD-10-CM

## 2015-11-02 MED ORDER — ALPRAZOLAM 2 MG PO TABS: ORAL_TABLET | ORAL | 0 refills | Status: DC

## 2015-11-03 NOTE — H&P (Signed)
[facility] 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   History of present illness: She is a 55 year old woman who suffered a mechanical fall and a proximal humerus fracture roughly three months ago.  She has been on Vitamin D and Calcium.  She was immobilized initially.  She has been unable to restore any motion.  She has pain significantly.  She did have an arthrogram, but no MRI, given her claustrophobia.  Arthrogram demonstrated an intact rotator cuff.  She has had persistent pain.  She reports that she did have full motion of the shoulder prior to this injury, but is not sure about her strength and felt that it was weak.  She has had multiple cervical surgeries and reports having persistent nerve symptoms prior to this injury.    EXAMINATION: Well appearing female in no apparent distress.  She has significant wasting of her deltoid.  She is able to fire it.  Sensation is intact in her hand, but reportedly sometimes it is numb.  She has tenderness at her proximal humerus.    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    ASSESSMENT/PLAN: We had a long talk about her options.  At this point I feel that she has not united this fracture.  Recommend ORIF with bone grafting and bone stimulator use as well.  Other alternative option would be a reverse total shoulder arthroplasty.  She would like to go forward with ORIF of this.  We will work on getting clearance for that initially.  She is plugged into a chronic pain clinic.  Ideally she would continue to get all pain medicine from her chronic pain clinic and we would write our normal regimen on top of  that.    Jewel Baizeimothy D.  Eulah PontMurphy, M.D.  Electronically verified by Jewel Baizeimothy D. Eulah PontMurphy, M.D. TDM:jjh Cc: Dr. Windy KalataStuart Meloy Fax: [phone] D 11-02-15 T 11-03-15

## 2015-11-04 ENCOUNTER — Ambulatory Visit
Admission: RE | Admit: 2015-11-04 | Discharge: 2015-11-04 | Disposition: A | Discharge status: Home / Self Care | Payer: Medicare Other | Source: Ambulatory Visit | Attending: Orthopedic Surgery | Admitting: Orthopedic Surgery

## 2015-11-04 DIAGNOSIS — M25511 Pain in right shoulder: Secondary | ICD-10-CM

## 2015-11-05 ENCOUNTER — Other Ambulatory Visit: Payer: Medicare Other

## 2015-11-09 ENCOUNTER — Encounter (HOSPITAL_COMMUNITY): Payer: Self-pay | Admitting: *Deleted

## 2015-11-09 NOTE — Progress Notes (Signed)
Spoke with pt for pre-op call. Pt denies cardiac history, chest pain or sob. Pt states she is NOT diabetic, has never been diabetic. States she was once treated with Metformin for Polycystic Ovarian Syndrome. She states she does have low blood sugar at times.

## 2015-11-10 ENCOUNTER — Ambulatory Visit (HOSPITAL_COMMUNITY): Payer: Medicare Other

## 2015-11-10 ENCOUNTER — Ambulatory Visit (HOSPITAL_COMMUNITY): Payer: Medicare Other | Admitting: Anesthesiology

## 2015-11-10 ENCOUNTER — Observation Stay (HOSPITAL_COMMUNITY)
Admission: AD | Admit: 2015-11-10 | Discharge: 2015-11-12 | Disposition: A | Discharge status: Skilled Nursing Facility | Payer: Medicare Other | Source: Ambulatory Visit | Attending: Orthopedic Surgery | Admitting: Orthopedic Surgery

## 2015-11-10 ENCOUNTER — Encounter (HOSPITAL_COMMUNITY)
Admission: AD | Disposition: A | Discharge status: Skilled Nursing Facility | Payer: Self-pay | Source: Ambulatory Visit | Attending: Orthopedic Surgery

## 2015-11-10 ENCOUNTER — Encounter (HOSPITAL_COMMUNITY): Payer: Self-pay | Admitting: *Deleted

## 2015-11-10 DIAGNOSIS — I739 Peripheral vascular disease, unspecified: Secondary | ICD-10-CM | POA: Insufficient documentation

## 2015-11-10 DIAGNOSIS — W19XXXA Unspecified fall, initial encounter: Secondary | ICD-10-CM | POA: Insufficient documentation

## 2015-11-10 DIAGNOSIS — M797 Fibromyalgia: Secondary | ICD-10-CM | POA: Diagnosis present

## 2015-11-10 DIAGNOSIS — G4733 Obstructive sleep apnea (adult) (pediatric): Secondary | ICD-10-CM | POA: Diagnosis not present

## 2015-11-10 DIAGNOSIS — M1990 Unspecified osteoarthritis, unspecified site: Secondary | ICD-10-CM | POA: Diagnosis not present

## 2015-11-10 DIAGNOSIS — S42201A Unspecified fracture of upper end of right humerus, initial encounter for closed fracture: Secondary | ICD-10-CM | POA: Diagnosis not present

## 2015-11-10 DIAGNOSIS — E222 Syndrome of inappropriate secretion of antidiuretic hormone: Secondary | ICD-10-CM | POA: Diagnosis present

## 2015-11-10 DIAGNOSIS — D500 Iron deficiency anemia secondary to blood loss (chronic): Secondary | ICD-10-CM

## 2015-11-10 DIAGNOSIS — G8929 Other chronic pain: Secondary | ICD-10-CM | POA: Diagnosis not present

## 2015-11-10 DIAGNOSIS — F319 Bipolar disorder, unspecified: Secondary | ICD-10-CM | POA: Diagnosis not present

## 2015-11-10 DIAGNOSIS — E538 Deficiency of other specified B group vitamins: Secondary | ICD-10-CM | POA: Diagnosis present

## 2015-11-10 DIAGNOSIS — E039 Hypothyroidism, unspecified: Secondary | ICD-10-CM | POA: Diagnosis present

## 2015-11-10 DIAGNOSIS — K219 Gastro-esophageal reflux disease without esophagitis: Secondary | ICD-10-CM | POA: Insufficient documentation

## 2015-11-10 DIAGNOSIS — M549 Dorsalgia, unspecified: Secondary | ICD-10-CM | POA: Diagnosis present

## 2015-11-10 DIAGNOSIS — S42209A Unspecified fracture of upper end of unspecified humerus, initial encounter for closed fracture: Secondary | ICD-10-CM | POA: Diagnosis present

## 2015-11-10 DIAGNOSIS — E871 Hypo-osmolality and hyponatremia: Secondary | ICD-10-CM | POA: Diagnosis present

## 2015-11-10 DIAGNOSIS — M129 Arthropathy, unspecified: Secondary | ICD-10-CM | POA: Diagnosis present

## 2015-11-10 DIAGNOSIS — F1190 Opioid use, unspecified, uncomplicated: Secondary | ICD-10-CM | POA: Diagnosis present

## 2015-11-10 DIAGNOSIS — S42291P Other displaced fracture of upper end of right humerus, subsequent encounter for fracture with malunion: Secondary | ICD-10-CM | POA: Diagnosis present

## 2015-11-10 DIAGNOSIS — F064 Anxiety disorder due to known physiological condition: Secondary | ICD-10-CM

## 2015-11-10 DIAGNOSIS — D510 Vitamin B12 deficiency anemia due to intrinsic factor deficiency: Secondary | ICD-10-CM

## 2015-11-10 DIAGNOSIS — G894 Chronic pain syndrome: Secondary | ICD-10-CM | POA: Diagnosis present

## 2015-11-10 DIAGNOSIS — Z419 Encounter for procedure for purposes other than remedying health state, unspecified: Secondary | ICD-10-CM

## 2015-11-10 HISTORY — DX: Unspecified convulsions: R56.9

## 2015-11-10 HISTORY — DX: Dyspnea, unspecified: R06.00

## 2015-11-10 HISTORY — DX: Personal history of other diseases of the female genital tract: Z87.42

## 2015-11-10 HISTORY — PX: ORIF HUMERUS FRACTURE: SHX2126

## 2015-11-10 HISTORY — DX: Asymptomatic varicose veins of bilateral lower extremities: I83.93

## 2015-11-10 HISTORY — DX: Pneumonia, unspecified organism: J18.9

## 2015-11-10 LAB — BASIC METABOLIC PANEL
ANION GAP: 8 (ref 5–15)
BUN: 18 mg/dL (ref 6–20)
CALCIUM: 8.9 mg/dL (ref 8.9–10.3)
CO2: 20 mmol/L — AB (ref 22–32)
Chloride: 106 mmol/L (ref 101–111)
Creatinine, Ser: 0.67 mg/dL (ref 0.44–1.00)
GFR calc non Af Amer: >60 mL/min (ref >60)
Glucose, Bld: 90 mg/dL (ref 65–99)
Potassium: 3.8 mmol/L (ref 3.5–5.1)
Sodium: 134 mmol/L — ABNORMAL LOW (ref 135–145)

## 2015-11-10 LAB — CBC
HCT: 35.2 % — ABNORMAL LOW (ref 36.0–46.0)
HEMOGLOBIN: 11.3 g/dL — AB (ref 12.0–15.0)
MCH: 29.9 pg (ref 26.0–34.0)
MCHC: 32.1 g/dL (ref 30.0–36.0)
MCV: 93.1 fL (ref 78.0–100.0)
Platelets: 154 10*3/uL (ref 150–400)
RBC: 3.78 MIL/uL — AB (ref 3.87–5.11)
RDW: 12.9 % (ref 11.5–15.5)
WBC: 3.3 10*3/uL — AB (ref 4.0–10.5)

## 2015-11-10 LAB — GLUCOSE, CAPILLARY: Glucose-Capillary: 83 mg/dL (ref 65–99)

## 2015-11-10 SURGERY — OPEN REDUCTION INTERNAL FIXATION (ORIF) PROXIMAL HUMERUS FRACTURE
Anesthesia: General | Site: Shoulder | Laterality: Right

## 2015-11-10 MED ORDER — METHOCARBAMOL 500 MG PO TABS
500.0000 mg | ORAL_TABLET | Freq: Four times a day (QID) | ORAL | Status: DC | PRN
  Administered 2015-11-11 – 2015-11-12 (×4): 500 mg via ORAL
  Filled 2015-11-10 (×4): qty 1

## 2015-11-10 MED ORDER — ONDANSETRON HCL 4 MG PO TABS: 4.0000 mg | ORAL_TABLET | Freq: Four times a day (QID) | ORAL | Status: DC | PRN

## 2015-11-10 MED ORDER — OXYCODONE HCL 10 MG PO TABS: 10.0000 mg | ORAL_TABLET | Freq: Four times a day (QID) | ORAL | Status: DC

## 2015-11-10 MED ORDER — MIDAZOLAM HCL 2 MG/2ML IJ SOLN
INTRAMUSCULAR | Status: AC
  Filled 2015-11-10: qty 2

## 2015-11-10 MED ORDER — FUROSEMIDE 20 MG PO TABS
20.0000 mg | ORAL_TABLET | Freq: Every day | ORAL | Status: DC
  Administered 2015-11-11: 20 mg via ORAL
  Filled 2015-11-10 (×2): qty 1

## 2015-11-10 MED ORDER — POLYETHYLENE GLYCOL 3350 17 G PO PACK: 17.0000 g | PACK | Freq: Every day | ORAL | Status: DC | PRN

## 2015-11-10 MED ORDER — SUMATRIPTAN SUCCINATE 100 MG PO TABS
100.0000 mg | ORAL_TABLET | ORAL | Status: DC | PRN
  Administered 2015-11-12 (×2): 100 mg via ORAL
  Filled 2015-11-10 (×4): qty 1

## 2015-11-10 MED ORDER — ENOXAPARIN SODIUM 40 MG/0.4ML ~~LOC~~ SOLN
40.0000 mg | SUBCUTANEOUS | Status: DC
  Administered 2015-11-11 – 2015-11-12 (×2): 40 mg via SUBCUTANEOUS
  Filled 2015-11-10 (×2): qty 0.4

## 2015-11-10 MED ORDER — ACETAMINOPHEN 650 MG RE SUPP: 650.0000 mg | Freq: Four times a day (QID) | RECTAL | Status: DC | PRN

## 2015-11-10 MED ORDER — ACETAMINOPHEN 325 MG PO TABS
ORAL_TABLET | ORAL | Status: AC
  Filled 2015-11-10: qty 3

## 2015-11-10 MED ORDER — BUPIVACAINE-EPINEPHRINE 0.5% -1:200000 IJ SOLN
INTRAMUSCULAR | Status: DC | PRN
  Administered 2015-11-10: 30 mL

## 2015-11-10 MED ORDER — DSS 100 MG PO CAPS
200.0000 mg | ORAL_CAPSULE | Freq: Every day | ORAL | Status: DC
  Administered 2015-11-10: 200 mg via ORAL

## 2015-11-10 MED ORDER — HYDROCODONE-ACETAMINOPHEN 5-325 MG PO TABS
1.0000 | ORAL_TABLET | ORAL | Status: DC | PRN
  Administered 2015-11-10: 1 via ORAL
  Administered 2015-11-11 – 2015-11-12 (×5): 2 via ORAL
  Filled 2015-11-10 (×6): qty 2

## 2015-11-10 MED ORDER — LACTATED RINGERS IV SOLN: INTRAVENOUS | Status: DC

## 2015-11-10 MED ORDER — PROPOFOL 10 MG/ML IV BOLUS
INTRAVENOUS | Status: AC
  Filled 2015-11-10: qty 20

## 2015-11-10 MED ORDER — VANCOMYCIN HCL IN DEXTROSE 1-5 GM/200ML-% IV SOLN
INTRAVENOUS | Status: AC
  Filled 2015-11-10: qty 200

## 2015-11-10 MED ORDER — LACTATED RINGERS IV SOLN
INTRAVENOUS | Status: DC
  Administered 2015-11-10: 23:00:00 via INTRAVENOUS

## 2015-11-10 MED ORDER — FENTANYL CITRATE (PF) 100 MCG/2ML IJ SOLN
INTRAMUSCULAR | Status: DC | PRN
  Administered 2015-11-10 (×2): 50 ug via INTRAVENOUS

## 2015-11-10 MED ORDER — LACTATED RINGERS IV SOLN
INTRAVENOUS | Status: DC
Start: 2015-11-10 — End: 2015-11-10
  Administered 2015-11-10: 12:00:00 via INTRAVENOUS

## 2015-11-10 MED ORDER — SUGAMMADEX SODIUM 200 MG/2ML IV SOLN
INTRAVENOUS | Status: AC
  Filled 2015-11-10: qty 2

## 2015-11-10 MED ORDER — FENTANYL CITRATE (PF) 100 MCG/2ML IJ SOLN
INTRAMUSCULAR | Status: AC
  Filled 2015-11-10: qty 2

## 2015-11-10 MED ORDER — FENTANYL CITRATE (PF) 100 MCG/2ML IJ SOLN
25.0000 ug | INTRAMUSCULAR | Status: DC | PRN
  Administered 2015-11-10: 50 ug via INTRAVENOUS
  Administered 2015-11-10 (×2): 25 ug via INTRAVENOUS

## 2015-11-10 MED ORDER — LEVOTHYROXINE SODIUM 50 MCG PO TABS
250.0000 ug | ORAL_TABLET | Freq: Every day | ORAL | Status: DC
  Administered 2015-11-11 – 2015-11-12 (×2): 250 ug via ORAL
  Filled 2015-11-10 (×2): qty 2

## 2015-11-10 MED ORDER — PAROXETINE HCL 20 MG PO TABS
20.0000 mg | ORAL_TABLET | Freq: Two times a day (BID) | ORAL | Status: DC
  Administered 2015-11-11 – 2015-11-12 (×4): 20 mg via ORAL
  Filled 2015-11-10 (×5): qty 1

## 2015-11-10 MED ORDER — METOCLOPRAMIDE HCL 5 MG/ML IJ SOLN: 5.0000 mg | Freq: Three times a day (TID) | INTRAMUSCULAR | Status: DC | PRN

## 2015-11-10 MED ORDER — 0.9 % SODIUM CHLORIDE (POUR BTL) OPTIME
TOPICAL | Status: DC | PRN
  Administered 2015-11-10: 1000 mL

## 2015-11-10 MED ORDER — HYDROCODONE-ACETAMINOPHEN 5-325 MG PO TABS
ORAL_TABLET | ORAL | Status: AC
  Filled 2015-11-10: qty 1

## 2015-11-10 MED ORDER — SUGAMMADEX SODIUM 200 MG/2ML IV SOLN
INTRAVENOUS | Status: DC | PRN
  Administered 2015-11-10: 160 mg via INTRAVENOUS

## 2015-11-10 MED ORDER — ROCURONIUM BROMIDE 100 MG/10ML IV SOLN
INTRAVENOUS | Status: DC | PRN
  Administered 2015-11-10: 40 mg via INTRAVENOUS

## 2015-11-10 MED ORDER — BISACODYL 10 MG RE SUPP: 10.0000 mg | Freq: Every day | RECTAL | Status: DC | PRN

## 2015-11-10 MED ORDER — ALPRAZOLAM 0.5 MG PO TABS
2.0000 mg | ORAL_TABLET | Freq: Every evening | ORAL | Status: DC | PRN
  Administered 2015-11-10: 2 mg via ORAL
  Filled 2015-11-10: qty 4

## 2015-11-10 MED ORDER — PANTOPRAZOLE SODIUM 40 MG PO TBEC
40.0000 mg | DELAYED_RELEASE_TABLET | Freq: Every day | ORAL | Status: DC
  Administered 2015-11-11 – 2015-11-12 (×2): 40 mg via ORAL
  Filled 2015-11-10 (×2): qty 1

## 2015-11-10 MED ORDER — TOPIRAMATE 100 MG PO TABS
100.0000 mg | ORAL_TABLET | Freq: Two times a day (BID) | ORAL | Status: DC
  Administered 2015-11-11 – 2015-11-12 (×4): 100 mg via ORAL
  Filled 2015-11-10 (×5): qty 1

## 2015-11-10 MED ORDER — MODAFINIL 100 MG PO TABS
400.0000 mg | ORAL_TABLET | Freq: Every morning | ORAL | Status: DC
  Administered 2015-11-11: 400 mg via ORAL
  Filled 2015-11-10 (×2): qty 4

## 2015-11-10 MED ORDER — METOCLOPRAMIDE HCL 5 MG PO TABS: 5.0000 mg | ORAL_TABLET | Freq: Three times a day (TID) | ORAL | Status: DC | PRN

## 2015-11-10 MED ORDER — PROPOFOL 10 MG/ML IV BOLUS
INTRAVENOUS | Status: DC | PRN
Start: 2015-11-10 — End: 2015-11-10
  Administered 2015-11-10: 170 mg via INTRAVENOUS

## 2015-11-10 MED ORDER — OXYCODONE-ACETAMINOPHEN 5-325 MG PO TABS: 1.0000 | ORAL_TABLET | ORAL | 0 refills | Status: DC | PRN

## 2015-11-10 MED ORDER — METHOCARBAMOL 500 MG PO TABS: 500.0000 mg | ORAL_TABLET | Freq: Four times a day (QID) | ORAL | 0 refills | Status: AC | PRN

## 2015-11-10 MED ORDER — LIDOCAINE HCL (CARDIAC) 20 MG/ML IV SOLN
INTRAVENOUS | Status: DC | PRN
  Administered 2015-11-10: 60 mg via INTRAVENOUS

## 2015-11-10 MED ORDER — ONDANSETRON HCL 4 MG PO TABS: 4.0000 mg | ORAL_TABLET | Freq: Three times a day (TID) | ORAL | 0 refills | Status: AC | PRN

## 2015-11-10 MED ORDER — MORPHINE SULFATE ER 60 MG PO TBCR
60.0000 mg | EXTENDED_RELEASE_TABLET | Freq: Two times a day (BID) | ORAL | Status: DC
  Administered 2015-11-11 – 2015-11-12 (×2): 60 mg via ORAL
  Filled 2015-11-10 (×3): qty 1

## 2015-11-10 MED ORDER — VANCOMYCIN HCL IN DEXTROSE 1-5 GM/200ML-% IV SOLN
1000.0000 mg | INTRAVENOUS | Status: AC
  Administered 2015-11-10: 1000 mg via INTRAVENOUS

## 2015-11-10 MED ORDER — ROCURONIUM BROMIDE 10 MG/ML (PF) SYRINGE
PREFILLED_SYRINGE | INTRAVENOUS | Status: AC
  Filled 2015-11-10: qty 10

## 2015-11-10 MED ORDER — ONDANSETRON HCL 4 MG/2ML IJ SOLN: 4.0000 mg | Freq: Four times a day (QID) | INTRAMUSCULAR | Status: DC | PRN

## 2015-11-10 MED ORDER — METHOCARBAMOL 1000 MG/10ML IJ SOLN
500.0000 mg | Freq: Four times a day (QID) | INTRAMUSCULAR | Status: DC | PRN
  Filled 2015-11-10: qty 5

## 2015-11-10 MED ORDER — PHENYLEPHRINE HCL 10 MG/ML IJ SOLN
INTRAVENOUS | Status: DC | PRN
  Administered 2015-11-10: 25 ug/min via INTRAVENOUS

## 2015-11-10 MED ORDER — CHLORHEXIDINE GLUCONATE 4 % EX LIQD: 60.0000 mL | Freq: Once | CUTANEOUS | Status: DC

## 2015-11-10 MED ORDER — ONDANSETRON HCL 4 MG/2ML IJ SOLN
INTRAMUSCULAR | Status: DC | PRN
  Administered 2015-11-10: 4 mg via INTRAVENOUS

## 2015-11-10 MED ORDER — DOCUSATE SODIUM 100 MG PO CAPS
200.0000 mg | ORAL_CAPSULE | Freq: Every day | ORAL | Status: DC
Start: 2015-11-10 — End: 2015-11-12
  Administered 2015-11-11: 200 mg via ORAL
  Filled 2015-11-10 (×2): qty 2

## 2015-11-10 MED ORDER — VITAMIN D 1000 UNITS PO TABS
10000.0000 [IU] | ORAL_TABLET | Freq: Every day | ORAL | Status: DC
  Administered 2015-11-11 – 2015-11-12 (×2): 10000 [IU] via ORAL
  Filled 2015-11-10 (×2): qty 10

## 2015-11-10 MED ORDER — OXYCODONE HCL 5 MG PO TABS
10.0000 mg | ORAL_TABLET | Freq: Three times a day (TID) | ORAL | Status: DC
  Administered 2015-11-11: 10 mg via ORAL
  Filled 2015-11-10: qty 2

## 2015-11-10 MED ORDER — SENNA 8.6 MG PO TABS
1.0000 | ORAL_TABLET | Freq: Two times a day (BID) | ORAL | Status: DC
  Administered 2015-11-10 – 2015-11-12 (×3): 8.6 mg via ORAL
  Filled 2015-11-10 (×4): qty 1

## 2015-11-10 MED ORDER — ACETAMINOPHEN 325 MG PO TABS: 650.0000 mg | ORAL_TABLET | Freq: Four times a day (QID) | ORAL | Status: DC | PRN

## 2015-11-10 MED ORDER — ACETAMINOPHEN 500 MG PO TABS
1000.0000 mg | ORAL_TABLET | Freq: Once | ORAL | Status: DC
Start: 2015-11-10 — End: 2015-11-10

## 2015-11-10 MED ORDER — PROMETHAZINE HCL 25 MG/ML IJ SOLN: 6.2500 mg | INTRAMUSCULAR | Status: DC | PRN

## 2015-11-10 MED ORDER — BUPIVACAINE-EPINEPHRINE (PF) 0.5% -1:200000 IJ SOLN
INTRAMUSCULAR | Status: AC
  Filled 2015-11-10: qty 30

## 2015-11-10 MED ORDER — FENTANYL CITRATE (PF) 100 MCG/2ML IJ SOLN
25.0000 ug | INTRAMUSCULAR | Status: DC | PRN
  Administered 2015-11-10: 25 ug via INTRAVENOUS
  Filled 2015-11-10: qty 2

## 2015-11-10 MED ORDER — LIDOCAINE 2% (20 MG/ML) 5 ML SYRINGE
INTRAMUSCULAR | Status: AC
  Filled 2015-11-10: qty 5

## 2015-11-10 SURGICAL SUPPLY — 71 items
APL SKNCLS STERI-STRIP NONHPOA (GAUZE/BANDAGES/DRESSINGS) ×1
BENZOIN TINCTURE PRP APPL 2/3 (GAUZE/BANDAGES/DRESSINGS) ×3 IMPLANT
BIT DRILL 3.2 (BIT) ×3
BIT DRILL 3.2XCALB NS DISP (BIT) IMPLANT
BIT DRILL CALIBRATED 2.7 (BIT) ×1 IMPLANT
BIT DRILL CALIBRATED 2.7MM (BIT) ×1
BIT DRL 3.2XCALB NS DISP (BIT) ×1
CLOSURE WOUND 1/2 X4 (GAUZE/BANDAGES/DRESSINGS) ×1
COVER SURGICAL LIGHT HANDLE (MISCELLANEOUS) ×3 IMPLANT
DRAPE C-ARM 42X72 X-RAY (DRAPES) ×3 IMPLANT
DRAPE IMP U-DRAPE 54X76 (DRAPES) ×3 IMPLANT
DRAPE INCISE IOBAN 66X45 STRL (DRAPES) ×3 IMPLANT
DRAPE U-SHAPE 47X51 STRL (DRAPES) ×3 IMPLANT
DRSG MEPILEX BORDER 4X8 (GAUZE/BANDAGES/DRESSINGS) ×2 IMPLANT
DURAPREP 26ML APPLICATOR (WOUND CARE) ×3 IMPLANT
ELECT REM PT RETURN 9FT ADLT (ELECTROSURGICAL)
ELECTRODE REM PT RTRN 9FT ADLT (ELECTROSURGICAL) IMPLANT
GAUZE SPONGE 4X4 12PLY STRL (GAUZE/BANDAGES/DRESSINGS) ×4 IMPLANT
GAUZE XEROFORM 1X8 LF (GAUZE/BANDAGES/DRESSINGS) ×1 IMPLANT
GLOVE BIO SURGEON STRL SZ7.5 (GLOVE) ×6 IMPLANT
GLOVE BIOGEL PI IND STRL 8 (GLOVE) ×2 IMPLANT
GLOVE BIOGEL PI INDICATOR 8 (GLOVE) ×4
GOWN STRL REUS W/ TWL LRG LVL3 (GOWN DISPOSABLE) ×2 IMPLANT
GOWN STRL REUS W/ TWL XL LVL3 (GOWN DISPOSABLE) ×1 IMPLANT
GOWN STRL REUS W/TWL 2XL LVL3 (GOWN DISPOSABLE) ×2 IMPLANT
GOWN STRL REUS W/TWL LRG LVL3 (GOWN DISPOSABLE) ×6
GOWN STRL REUS W/TWL XL LVL3 (GOWN DISPOSABLE)
K-WIRE 2X5 SS THRDED S3 (WIRE) ×6
KIT BASIN OR (CUSTOM PROCEDURE TRAY) ×3 IMPLANT
KIT ROOM TURNOVER OR (KITS) ×3 IMPLANT
KWIRE 2X5 SS THRDED S3 (WIRE) IMPLANT
MANIFOLD NEPTUNE II (INSTRUMENTS) ×3 IMPLANT
NDL HYPO 25GX1X1/2 BEV (NEEDLE) IMPLANT
NEEDLE HYPO 25GX1X1/2 BEV (NEEDLE) IMPLANT
NS IRRIG 1000ML POUR BTL (IV SOLUTION) ×3 IMPLANT
PACK SHOULDER (CUSTOM PROCEDURE TRAY) ×3 IMPLANT
PACK UNIVERSAL I (CUSTOM PROCEDURE TRAY) ×1 IMPLANT
PAD ABD 8X10 STRL (GAUZE/BANDAGES/DRESSINGS) ×2 IMPLANT
PAD ARMBOARD 7.5X6 YLW CONV (MISCELLANEOUS) ×6 IMPLANT
PEG LOCKING 3.2X 60MM (Peg) ×2 IMPLANT
PEG LOCKING 3.2X36 (Screw) ×2 IMPLANT
PEG LOCKING 3.2X38 (Screw) ×2 IMPLANT
PEG LOCKING 3.2X40 (Peg) ×2 IMPLANT
PEG LOCKING 3.2X50 (Screw) ×4 IMPLANT
PLATE PROX HUM LO R 4H 83 (Plate) ×2 IMPLANT
SCREW CORTICAL LOW PROF 3.5X32 (Screw) ×2 IMPLANT
SCREW LOCK CORT STAR 3.5X30 (Screw) ×2 IMPLANT
SCREW LOW PROFILE 3.5X30MM TIS (Screw) ×2 IMPLANT
SCREW PEG LOCK 3.2X30MM (Screw) ×2 IMPLANT
SCREW T15 LP CORT 3.5X38MM NS (Screw) ×2 IMPLANT
SLEEVE MEASURING 3.2 (BIT) ×2 IMPLANT
SLING ARM FOAM STRAP LRG (SOFTGOODS) ×2 IMPLANT
SPONGE LAP 4X18 X RAY DECT (DISPOSABLE) IMPLANT
STAPLER VISISTAT 35W (STAPLE) IMPLANT
STRIP CLOSURE SKIN 1/2X4 (GAUZE/BANDAGES/DRESSINGS) ×2 IMPLANT
SUCTION FRAZIER HANDLE 10FR (MISCELLANEOUS) ×2
SUCTION TUBE FRAZIER 10FR DISP (MISCELLANEOUS) ×1 IMPLANT
SUPPORT WRAP ARM LG (MISCELLANEOUS) ×3 IMPLANT
SUT FIBERWIRE #2 38 T-5 BLUE (SUTURE)
SUT MNCRL AB 4-0 PS2 18 (SUTURE) ×3 IMPLANT
SUT MON AB 2-0 CT1 27 (SUTURE) ×3 IMPLANT
SUT MON AB 2-0 CT1 36 (SUTURE) ×2 IMPLANT
SUT VIC AB 0 CTB1 27 (SUTURE) IMPLANT
SUTURE FIBERWR #2 38 T-5 BLUE (SUTURE) IMPLANT
SYR BULB IRRIGATION 50ML (SYRINGE) ×3 IMPLANT
SYR CONTROL 10ML LL (SYRINGE) IMPLANT
TISSUE ALLOGRAFT 10CC CELLENTR (Tissue) ×2 IMPLANT
TOWEL OR 17X24 6PK STRL BLUE (TOWEL DISPOSABLE) ×3 IMPLANT
TOWEL OR 17X26 10 PK STRL BLUE (TOWEL DISPOSABLE) ×3 IMPLANT
TOWEL OR NON WOVEN STRL DISP B (DISPOSABLE) ×3 IMPLANT
WATER STERILE IRR 1000ML POUR (IV SOLUTION) ×3 IMPLANT

## 2015-11-10 NOTE — Anesthesia Preprocedure Evaluation (Addendum)
Anesthesia Evaluation  Patient identified by MRN, date of birth, ID band Patient awake    Reviewed: Allergy & Precautions, NPO status , Patient's Chart, lab work & pertinent test results  Airway Mallampati: I       Dental  (+) Edentulous Upper, Edentulous Lower   Pulmonary shortness of breath, sleep apnea , pneumonia,    breath sounds clear to auscultation       Cardiovascular + Peripheral Vascular Disease   Rhythm:Regular Rate:Normal     Neuro/Psych  Headaches,    GI/Hepatic Neg liver ROS, GERD  ,  Endo/Other  diabetesHypothyroidism   Renal/GU negative Renal ROS     Musculoskeletal  (+) Arthritis ,   Abdominal   Peds  Hematology   Anesthesia Other Findings   Reproductive/Obstetrics                            Anesthesia Physical Anesthesia Plan  ASA: III  Anesthesia Plan: General   Post-op Pain Management:    Induction: Intravenous  Airway Management Planned: Oral ETT  Additional Equipment:   Intra-op Plan:   Post-operative Plan: Extubation in OR  Informed Consent: I have reviewed the patients History and Physical, chart, labs and discussed the procedure including the risks, benefits and alternatives for the proposed anesthesia with the patient or authorized representative who has indicated his/her understanding and acceptance.   Dental advisory given  Plan Discussed with: Anesthesiologist  Anesthesia Plan Comments:        Anesthesia Quick Evaluation

## 2015-11-10 NOTE — Interval H&P Note (Signed)
History and Physical Interval Note:  11/10/2015 11:59 AM  Valerie Silva  has presented today for surgery, with the diagnosis of NON-UNION RT PROXIMAL HUMERUS FRACTURE  The various methods of treatment have been discussed with the patient and family. After consideration of risks, benefits and other options for treatment, the patient has consented to  Procedure(s): OPEN REDUCTION INTERNAL FIXATION (ORIF) PROXIMAL HUMERUS FRACTURE (Right) as a surgical intervention .  The patient's history has been reviewed, patient examined, no change in status, stable for surgery.  I have reviewed the patient's chart and labs.  Questions were answered to the patient's satisfaction.     [facility]UR[facility], TIMOTHY D

## 2015-11-10 NOTE — Transfer of Care (Signed)
Immediate Anesthesia Transfer of Care Note  Patient: Erlinda HongLaura J Kroenke  Procedure(s) Performed: Procedure(s): OPEN REDUCTION INTERNAL FIXATION (ORIF) PROXIMAL HUMERUS FRACTURE (Right)  Patient Location: PACU  Anesthesia Type:General  Level of Consciousness: awake, alert  and oriented  Airway & Oxygen Therapy: Patient Spontanous Breathing and Patient connected to nasal cannula oxygen  Post-op Assessment: Report given to RN and Post -op Vital signs reviewed and stable  Post vital signs: Reviewed and stable  Last Vitals:  Vitals:   11/10/15 1117 11/10/15 1526  BP: (!) 89/56 114/69  Pulse: 67 76  Resp: 18 11  Temp: 36.4 C 36.4 C    Last Pain:  Vitals:   11/10/15 1117  TempSrc: Oral         Complications: No apparent anesthesia complications

## 2015-11-10 NOTE — Discharge Instructions (Signed)
Diet: As you were doing prior to hospitalization   Shower:  Keep the wounds and bandage dry, use an occlusive plastic wrap, NO SOAKING IN TUB.  If the bandage gets wet, change with a clean dry gauze.   Dressing:  We will change your bandages during your first follow-up appointment.    If you had hand or foot surgery, we will plan to remove your stitches in about 2 weeks in the office.  For all other surgeries, there are sticky tapes (steri-strips) on your wounds and all the stitches are absorbable.  Leave the steri-strips in place when changing your dressings, they will peel off with time, usually 2-3 weeks.  Activity:  Increase activity slowly as tolerated, but follow the weight bearing instructions below.  The rules on driving is that you can not be taking narcotics while you drive, and you must feel in control of the vehicle.    Weight Bearing:   Non weight bearing right arm    To prevent constipation: you may use a stool softener such as -  Colace (over the counter) 100 mg by mouth twice a day  Drink plenty of fluids (prune juice may be helpful) and high fiber foods Miralax (over the counter) for constipation as needed.    Itching:  If you experience itching with your medications, try taking only a single pain pill, or even half a pain pill at a time.  You may take up to 10 pain pills per day, and you can also use benadryl over the counter for itching or also to help with sleep.   Precautions:  If you experience chest pain or shortness of breath - call 911 immediately for transfer to the hospital emergency department!!  If you develop a fever greater that 101 F, purulent drainage from wound, increased redness or drainage from wound, or calf pain -- Call the office at [phone]                                                Follow- Up Appointment:  Please call for an appointment to be seen in 2 weeks [place] - [phone]

## 2015-11-10 NOTE — Anesthesia Postprocedure Evaluation (Signed)
Anesthesia Post Note  Patient: Valerie Silva  Procedure(s) Performed: Procedure(s) (LRB): OPEN REDUCTION INTERNAL FIXATION (ORIF) PROXIMAL HUMERUS FRACTURE (Right)  Patient location during evaluation: PACU Anesthesia Type: General Level of consciousness: awake and alert Pain management: pain level controlled Vital Signs Assessment: post-procedure vital signs reviewed and stable Respiratory status: spontaneous breathing, nonlabored ventilation, respiratory function stable and patient connected to nasal cannula oxygen Cardiovascular status: blood pressure returned to baseline and stable Postop Assessment: no signs of nausea or vomiting Anesthetic complications: no    Last Vitals:  Vitals:   11/10/15 1117 11/10/15 1526  BP: (!) 89/56 114/69  Pulse: 67 76  Resp: 18 11  Temp: 36.4 C 36.4 C    Last Pain:  Vitals:   11/10/15 1629  TempSrc:   PainSc: 10-Worst pain ever                 Bonita Quinichard S Makalya Nave

## 2015-11-10 NOTE — Op Note (Addendum)
11/10/2015  3:05 PM  PATIENT:  Valerie Silva    PRE-OPERATIVE DIAGNOSIS:  NON-UNION RT PROXIMAL HUMERUS FRACTURE  POST-OPERATIVE DIAGNOSIS:  Same  PROCEDURE:  OPEN REDUCTION INTERNAL FIXATION (ORIF) PROXIMAL HUMERUS FRACTURE  SURGEON:  Sherill Wegener, Jewel BaizeIMOTHY D, MD  PHYSICIAN ASSISTANT: Aquilla HackerHenry Martensen, PA-C, he was present and scrubbed throughout the case, critical for completion in a timely fashion, and for retraction, instrumentation, and closure.   ANESTHESIA:   General  PREOPERATIVE INDICATIONS:  Valerie HongLaura J Jacko is a  55 y.o. female with a diagnosis of NON-UNION RT PROXIMAL HUMERUS FRACTURE who elected for surgical management.    The risks benefits and alternatives were discussed with the patient including but not limited to the risks of nonoperative treatment, versus surgical intervention including infection, bleeding, nerve injury, malunion, nonunion, the need for revision surgery, hardware prominence, hardware failure, the need for hardware removal, blood clots, cardiopulmonary complications, conversion to arthroplasty, morbidity, mortality, among others, and they were willing to proceed.  Predicted outcome is good, although there will be at least a six to nine month expected recovery.    OPERATIVE IMPLANTS: Biomet S3 locking plate  OPERATIVE FINDINGS: Displaced proximal humerus fracture  OPERATIVE PROCEDURE: The patient was brought to the operating room and placed in the supine position. General anesthesia was administered. IV antibiotics were given. She was placed in the beach chair position. All bony prominences were padded. The upper extremity was prepped and draped in usual sterile fashion. Deltopectoral incision was performed.  I exposed the fracture site, and placed deep retractors. I mobilized her fracture and removed her fibrous non-union.   I applied the plate and secured it into the sliding hole first. I confirmed position of the reduction and the plate with C-arm, and I  placed a total of 2 guidewires into the appropriate position in the head. I was satisfied that the plate was distal appropriately, and then secured the plate proximally with smooth pegs, taking care to prevent penetration into the arch articular surface, using C-arm, as well as manual feel using a hand drill.  I placed bone graft in the fracture site  I then secured the plate distally using another cortical screw. Once complete fixation and reduction of been achieved, took final C-arm pictures, and irrigated the wounds copiously, and repaired the deltopectoral interval with Vicryl followed by Vicryl for the subcutaneous tissue with Monocryl and Steri-Strips for the skin. She was placed in a sling. She had a preoperative regional block as well. She tolerated the procedure well with no complications.   POST OPERATIVE PLAN: Sling full time, DVT px: Ambulation and foot pumps

## 2015-11-10 NOTE — Anesthesia Procedure Notes (Signed)
Procedure Name: Intubation Date/Time: 11/10/2015 1:17 PM Performed by: Sharlene DoryWALKER, Jisselle Poth E Pre-anesthesia Checklist: Patient identified, Emergency Drugs available, Suction available and Patient being monitored Patient Re-evaluated:Patient Re-evaluated prior to inductionOxygen Delivery Method: Circle system utilized Preoxygenation: Pre-oxygenation with 100% oxygen Intubation Type: IV induction Ventilation: Mask ventilation without difficulty Laryngoscope Size: Mac and 3 Grade View: Grade I Tube type: Oral Tube size: 7.0 mm Number of attempts: 1 Airway Equipment and Method: Stylet Placement Confirmation: ETT inserted through vocal cords under direct vision,  positive ETCO2 and breath sounds checked- equal and bilateral Secured at: 21 cm Tube secured with: Tape Dental Injury: Teeth and Oropharynx as per pre-operative assessment

## 2015-11-10 NOTE — Progress Notes (Addendum)
Patient very sleepy.  Has taken her pain meds, and drops off to sleep easily.   Dr. Randa EvensEdwards in to speak with patient and niece.  She is deferring judgement call to Dr. Eulah PontMurphy whether we need to proceed with surgery. Dr Eulah PontMurphy has come in and discussed case with patient and niece. He states that tylenol will not be needed. We are to proceed with surgery.   Niece has gone home to children    Venetia Maxonina Policastro  [phone] [place]) [place] -- best friend  [phone] Blood pressure readings 92/57,  89/60,  94/64, 85/55, 86/52.  HR remains consistent in 505-60 range.   Ox sat 96

## 2015-11-11 DIAGNOSIS — S42201A Unspecified fracture of upper end of right humerus, initial encounter for closed fracture: Secondary | ICD-10-CM | POA: Diagnosis not present

## 2015-11-11 DIAGNOSIS — S42209A Unspecified fracture of upper end of unspecified humerus, initial encounter for closed fracture: Secondary | ICD-10-CM | POA: Diagnosis present

## 2015-11-11 MED ORDER — ALPRAZOLAM 0.5 MG PO TABS
2.0000 mg | ORAL_TABLET | Freq: Two times a day (BID) | ORAL | Status: DC | PRN
  Administered 2015-11-11 (×2): 2 mg via ORAL
  Filled 2015-11-11 (×2): qty 4

## 2015-11-11 MED ORDER — OXYCODONE HCL 5 MG PO TABS
10.0000 mg | ORAL_TABLET | Freq: Four times a day (QID) | ORAL | Status: DC | PRN
  Administered 2015-11-11 – 2015-11-12 (×3): 10 mg via ORAL
  Filled 2015-11-11 (×4): qty 2

## 2015-11-11 MED FILL — Docusate Sodium Cap 100 MG: ORAL | Qty: 2 | Status: AC

## 2015-11-11 NOTE — Progress Notes (Signed)
Pt very somnolent this AM.   Pt would respond to pain but then fall right back to sleep.  Belongings checked and patient has home medications of Xanax and Acarbose.  Once patient awakened more, discussed the policy with patient regarding having home medications.  Pt stated she would see if she could get someone to pick up her medications.  Explained to pt that if no one came by the end of my shift, the medications would be locked up in pharmacy.  Will continue to monitor.

## 2015-11-11 NOTE — Care Management CC44 (Signed)
Condition Code 44 Documentation Completed  Patient Details  Name: Valerie HongLaura J Silva MRN: [id number] Date of Birth: 1960-09-10   Condition Code 44 given:    Patient signature on Condition Code 44 notice:    Documentation of 2 MD's agreement:    Code 44 added to claim:       Kingsley PlanWile, Talaysia Pinheiro [name], RN 11/11/2015, 2:56 PM

## 2015-11-11 NOTE — Evaluation (Signed)
Occupational Therapy Evaluation Patient Details Name: Valerie Silva MRN: [id number] DOB: [birth year] Today's Date: 11/11/2015    History of Present Illness Valerie Silva is a  55 y.o. female with a diagnosis of NON-UNION RT PROXIMAL HUMERUS FRACTURE who elected for surgical management.     Clinical Impression   PTA, pt was independent with assistive devices for ADL and IADL and used a cane for functional mobility. Pt currently requires min assist for LB ADL and mod assist for UB ADL to adhere to RUE precautions. Educated pt concerning dressing and bathing techniques, sling wear schedule, and edema control for R hand while wearing sling. Pt reports understanding but would benefit from reinforcement. Pt lives alone and will only have limited assistance from son and daughter-in-law. Feel pt would benefit from short-term stay at SNF for continued rehabilitation to improve independence and safety with ADL prior to going home. OT will continue to follow acutely.    Follow Up Recommendations  SNF;Supervision/Assistance - 24 hour;Other (comment) (Pt may refuse and if so will need HHOT)    Equipment Recommendations  Other (comment) (Defer to next venue of care)    Recommendations for Other Services       Precautions / Restrictions Precautions Precautions: Fall Required Braces or Orthoses: Sling (R shoulder) Restrictions Weight Bearing Restrictions: Yes RUE Weight Bearing: Non weight bearing      Mobility Bed Mobility Overal bed mobility: Needs Assistance Bed Mobility: Supine to Sit;Sit to Supine     Supine to sit: Supervision Sit to supine: Supervision   General bed mobility comments: VC's for technique  Transfers Overall transfer level: Needs assistance Equipment used: None Transfers: Sit to/from Stand Sit to Stand: Min guard         General transfer comment: Pt was able to stand from bed with cues for hand placement.  No assist needed.     Balance Overall balance  assessment: Needs assistance Sitting-balance support: No upper extremity supported;Feet supported Sitting balance-Leahy Scale: Good     Standing balance support: No upper extremity supported Standing balance-Leahy Scale: Fair Standing balance comment: Able to stand statically without UE support, needs support for dynamic             High level balance activites: Direction changes;Turns High Level Balance Comments: min guard assist for above.            ADL Overall ADL's : Needs assistance/impaired     Grooming: Set up;Sitting   Upper Body Bathing: Moderate assistance;Sitting;Adhering to UE precautions   Lower Body Bathing: Minimal assistance;Sit to/from stand   Upper Body Dressing : Moderate assistance;Sitting;Adhering to UE precautions   Lower Body Dressing: Minimal assistance;Sit to/from stand   Toilet Transfer: Theme park managerMin guard;RW;Regular Toilet   Toileting- ArchitectClothing Manipulation and Hygiene: Min guard;Sit to/from stand       Functional mobility during ADLs: Min guard General ADL Comments: Educated pt on R UE NWB precautions, sling wear schedule, dressing/bathing techniques, and edema management with R hand.     Vision Vision Assessment?: No apparent visual deficits   Perception     Praxis      Pertinent Vitals/Pain Pain Assessment: Faces Faces Pain Scale: Hurts even more Pain Location: R UE Pain Descriptors / Indicators: Aching;Operative site guarding;Sore Pain Intervention(s): Limited activity within patient's tolerance;Monitored during session;Repositioned;Ice applied     Hand Dominance Right   Extremity/Trunk Assessment Upper Extremity Assessment Upper Extremity Assessment: RUE deficits/detail RUE Deficits / Details: Increased pain in RUE, unable to fully assess due to  immobilization RUE: Unable to fully assess due to immobilization   Lower Extremity Assessment Lower Extremity Assessment: Generalized weakness   Cervical / Trunk  Assessment Cervical / Trunk Assessment: Normal   Communication Communication Communication: No difficulties   Cognition Arousal/Alertness: Lethargic;Suspect due to medications Behavior During Therapy: Flat affect Overall Cognitive Status: No family/caregiver present to determine baseline cognitive functioning                     General Comments       Exercises       Shoulder Instructions      Home Living Family/patient expects to be discharged to:: Private residence Living Arrangements: Alone Available Help at Discharge: Family;Available PRN/intermittently (Son and daughter in law may assist intermittently) Type of Home: Apartment Home Access: Level entry     Home Layout: One level     Bathroom Shower/Tub: Producer, television/film/videoWalk-in shower   Bathroom Toilet: Handicapped height Bathroom Accessibility: Yes   Home Equipment: Environmental consultantWalker - 2 wheels;Cane - single point;Wheelchair - manual;Grab bars - toilet;Grab bars - tub/shower;Shower seat - built in   Additional Comments: Pt states that sonwill take her dog as she can't care for dog at present      Prior Functioning/Environment Level of Independence: Needs assistance  Gait / Transfers Assistance Needed: Pt used cane at times per pt and states she has taken care of self since july with right fractured UE on her own ADL's / Homemaking Assistance Needed: States she was Modif I.   Comments: Larey SeatFell in July and fractured right UE.  States she has functioned better since gettting the handicapped apartment.        OT Problem List: Decreased strength;Decreased range of motion;Decreased activity tolerance;Impaired balance (sitting and/or standing);Decreased safety awareness;Decreased knowledge of use of DME or AE;Decreased knowledge of precautions;Pain   OT Treatment/Interventions: Self-care/ADL training;Therapeutic exercise;DME and/or AE instruction;Therapeutic activities;Patient/family education;Balance training    OT Goals(Current goals  can be found in the care plan section) Acute Rehab OT Goals Patient Stated Goal: to get better OT Goal Formulation: With patient Time For Goal Achievement: 11/25/15 Potential to Achieve Goals: Good ADL Goals Pt Will Perform Upper Body Bathing: with modified independence;sitting Pt Will Perform Lower Body Bathing: with modified independence;sit to/from stand Pt Will Perform Upper Body Dressing: with modified independence;sitting Pt Will Perform Lower Body Dressing: with modified independence;sit to/from stand Pt Will Transfer to Toilet: with modified independence;ambulating;regular height toilet Pt Will Perform Toileting - Clothing Manipulation and hygiene: with modified independence;sit to/from stand Pt Will Perform Tub/Shower Transfer: with modified independence;ambulating;shower seat  OT Frequency: Min 1X/week   Barriers to D/C:            Co-evaluation              End of Session Equipment Utilized During Treatment: Gait belt;Other (comment) (R shoulder sling) Nurse Communication: Other (comment) (Pt requesting crackers)  Activity Tolerance: Patient tolerated treatment well Patient left: in bed;with call bell/phone within reach;with bed alarm set   Time: [id number] OT Time Calculation (min): 36 min Charges:  OT General Charges $OT Visit: 1 Procedure OT Evaluation $OT Eval Moderate Complexity: 1 Procedure OT Treatments $Self Care/Home Management : 8-22 mins  Doristine SectionCharity A Brelee Renk, OTR/L [phone] 11/11/2015, 1:32 PM

## 2015-11-11 NOTE — Progress Notes (Addendum)
OT Cancellation Note  Patient Details Name: Valerie Silva MRN: [id number] DOB: September 21, 1960   Cancelled Treatment:    Reason Eval/Treat Not Completed: Fatigue/lethargy limiting ability to participate. Pt lethargic and unable to stay awake longer than a few seconds to participate in treatment with OT after multiple attempts. Recent note from PA reports similar findings and feels this is likely due to medication. Discussed with RN who is aware and in agreement. Will check back for OT eval as able.  31 Lawrence StreetCharity A Kaya Pottenger, OTR/L [phone] 11/11/2015, 8:47 AM

## 2015-11-11 NOTE — Evaluation (Signed)
Physical Therapy Evaluation Patient Details Name: Valerie HongLaura J Badeaux MRN: [id number] DOB: [birth year] Today's Date: 11/11/2015   History of Present Illness  Valerie Silva is a  55 y.o. female with a diagnosis of NON-UNION RT PROXIMAL HUMERUS FRACTURE who elected for surgical management.    Clinical Impression  Pt admitted with above diagnosis. Pt currently with functional limitations due to the deficits listed below (see PT Problem List). Pt was able to ambulate with PT without device with overall good safety.  Most likely close to baseline.  States apartment is handicapped accessible and son to take care of her dog.  Most likely short term SNF best option prior to d/c home but if pt refuses, HHPT, HHOT and HHaide.  Pt will benefit from skilled PT to increase their independence and safety with mobility to allow discharge to the venue listed below.      Follow Up Recommendations SNF;Supervision/Assistance - 24 hour (Feel SNF for a short stay best option,pt will likely refuse,and if so, HHServices as above.)    Equipment Recommendations  Hospital bed    Recommendations for Other Services       Precautions / Restrictions Precautions Precautions: Fall Required Braces or Orthoses: Sling Restrictions Weight Bearing Restrictions: Yes RUE Weight Bearing: Non weight bearing      Mobility  Bed Mobility Overal bed mobility: Needs Assistance Bed Mobility: Supine to Sit     Supine to sit: Modified independent (Device/Increase time)     General bed mobility comments: HOB fairly flat. Used rail a little.  Transfers Overall transfer level: Needs assistance Equipment used: None Transfers: Sit to/from Stand Sit to Stand: Min guard         General transfer comment: Pt was able to stand from bed with cues for hand placement.  No assist needed.   Ambulation/Gait Ambulation/Gait assistance: Min guard Ambulation Distance (Feet): 250 Feet Assistive device: None Gait Pattern/deviations:  Step-through pattern;Decreased stride length   Gait velocity interpretation: <1.8 ft/sec, indicative of risk for recurrent falls General Gait Details: Pt able to ambulate well without LOB with some slight challenges to balance.  Pt feels she is at baseline and states she has been not using right UE for some time now and can manage at home.    Stairs            Wheelchair Mobility    Modified Rankin (Stroke Patients Only)       Balance Overall balance assessment: Needs assistance;History of Falls Sitting-balance support: No upper extremity supported;Feet supported Sitting balance-Leahy Scale: Good     Standing balance support: No upper extremity supported;During functional activity Standing balance-Leahy Scale: Fair Standing balance comment: can stand statically without UE support.             High level balance activites: Direction changes;Turns High Level Balance Comments: min guard assist for above.             Pertinent Vitals/Pain Pain Assessment: Faces Faces Pain Scale: Hurts even more Pain Location: right UE Pain Descriptors / Indicators: Aching;Grimacing;Guarding;Operative site guarding Pain Intervention(s): Limited activity within patient's tolerance;Monitored during session;Patient requesting pain meds-RN notified;Repositioned  VSS    Home Living Family/patient expects to be discharged to:: Private residence Living Arrangements: Children;Other relatives (son works, niece assists some) Available Help at Discharge: Family;Available PRN/intermittently Type of Home: Apartment Home Access: Level entry     Home Layout: One level Home Equipment: Walker - 2 wheels;Cane - single point;Wheelchair - manual;Grab bars - toilet;Grab bars - tub/shower;Shower seat -  built in Additional Comments: Pt states that sonwill take her dog as she can't care for dog at present    Prior Function Level of Independence: Needs assistance   Gait / Transfers Assistance  Needed: Pt used cane at times per pt and states she has taken care of self since july with right fractured UE on her own  ADL's / Homemaking Assistance Needed: States she was Modif I.  Comments: Larey SeatFell in July and fractured right UE.  States she has functioned better since gettting the handicapped apartment.     Hand Dominance        Extremity/Trunk Assessment   Upper Extremity Assessment: Defer to OT evaluation           Lower Extremity Assessment: Generalized weakness      Cervical / Trunk Assessment: Normal  Communication   Communication: No difficulties  Cognition Arousal/Alertness: Lethargic;Suspect due to medications Behavior During Therapy: Flat affect Overall Cognitive Status: No family/caregiver present to determine baseline cognitive functioning                      General Comments General comments (skin integrity, edema, etc.): Pt drifted off to sleep quickly upon return to room.  Asking for XAnax and when PT called nursing nursing stated she would talk to pt as she was somnolent earlier.    Exercises     Assessment/Plan    PT Assessment Patient needs continued PT services  PT Problem List Decreased activity tolerance;Decreased balance;Decreased mobility;Decreased knowledge of use of DME;Decreased safety awareness;Decreased knowledge of precautions;Pain          PT Treatment Interventions DME instruction;Gait training;Functional mobility training;Therapeutic activities;Balance training;Therapeutic exercise;Patient/family education    PT Goals (Current goals can be found in the Care Plan section)  Acute Rehab PT Goals Patient Stated Goal: to get better PT Goal Formulation: With patient Time For Goal Achievement: 11/25/15 Potential to Achieve Goals: Good    Frequency Min 3X/week   Barriers to discharge Decreased caregiver support      Co-evaluation               End of Session Equipment Utilized During Treatment: Gait belt;Other  (comment) (sling right UE) Activity Tolerance: Patient tolerated treatment well Patient left: in bed;with call bell/phone within reach;with bed alarm set Nurse Communication: Mobility status;Patient requests pain meds         Time: [id number] PT Time Calculation (min) (ACUTE ONLY): 15 min   Charges:   PT Evaluation $PT Eval Moderate Complexity: 1 Procedure     PT G CodesBerline Lopes:        Ahmani Prehn F 11/11/2015, 11:37 AM  Eber Jonesawn Kainoa Swoboda,PT Acute Rehabilitation [phone] (pager)

## 2015-11-11 NOTE — Progress Notes (Addendum)
Assessment: 1 Day Post-Op  S/P Procedure(s) (LRB): OPEN REDUCTION INTERNAL FIXATION (ORIF) PROXIMAL HUMERUS FRACTURE (Right) by Dr. Jewel Baizeimothy D. Eulah PontMurphy on 11/10/15  Active Problems:   Hypothyroidism   Arthropathy   Fibromyalgia   OSA (obstructive sleep apnea)   Vitamin B 12 deficiency   SIADH (syndrome of inappropriate ADH production) (HCC)   Bipolar I disorder (HCC)   Chronic hyponatremia   Chronic narcotic use   Back pain   Chronic pain associated with significant psychosocial dysfunction   Humerus head fracture, right, with malunion, subsequent encounter  Nursing Report overnight was that she was up, conversant, eating, drinking, and going to the bathroom.  However, she is quite somnolent this morning.  Norco and robaxin given at 0530 this morning.  There was some concern raised to incoming nurse that the patient may have some of her home medicines with her - this was not verified, but attention to same throughout the day is warranted.  When asked, she said that she brought yogurt pills from home and took them dt lactose intolerance.   Plan: Continuous O2 monitoring - please place asap. Up with therapy Monitor somnolence closely- seems to be opioid induced this morning in the setting of chronic pain patient.   PT / OT / SW evaluation - She lives alone, fracture is from a fall, has difficult chronic pain management, with recent operation and decreased ability to care for herself.  SNF may be the most appropriate setting for her.  Weight Bearing: Non Weight Bearing (NWB) right arm Dressings: Mepilex, gauze / ace / tape.  VTE prophylaxis: Lovenox while inpatient, SCDs, ambulation Dispo: Skilled Nursing Facility/Rehab - PT / OT / SW evaluations pending.  Subjective: Patient reports pain as moderate.  Tolerating some diet.  Appetite decreased.  Urinating.  OOB in the room per nursing report.  Objective:   VITALS:   Vitals:   11/10/15 1915 11/10/15 1956 11/10/15 2044 11/11/15  0530  BP:   114/72 113/67  Pulse: 73  77 88  Resp: 14  19 19   Temp:  98.7 F (37.1 C) 99 F (37.2 C) 98.9 F (37.2 C)  TempSrc:   Oral Oral  SpO2: 96%  100% 100%  Weight:      Height:       CBC Latest Ref Rng & Units 11/10/2015 09/03/2015 05/12/2015  WBC 4.0 - 10.5 K/uL 3.3(L) 5.4 5.3  Hemoglobin 12.0 - 15.0 g/dL 11.3(L) 13.0 11.7  Hematocrit 36.0 - 46.0 % 35.2(L) 38.5 35.9  Platelets 150 - 400 K/uL 154 201 183   BMP Latest Ref Rng & Units 11/10/2015 05/12/2015 04/29/2015  Glucose 65 - 99 mg/dL 90 865(H119(H) 87  BUN 6 - 20 mg/dL 18 84(O25(H) 96.225.1  Creatinine 0.44 - 1.00 mg/dL 9.520.67 0.9 1.0  Sodium 841135 - 145 mmol/L 134(L) 140 138  Potassium 3.5 - 5.1 mmol/L 3.8 3.5 3.3(L)  Chloride 101 - 111 mmol/L 106 105 -  CO2 22 - 32 mmol/L 20(L) 25 22  Calcium 8.9 - 10.3 mg/dL 8.9 8.6 9.0   Intake/Output      10/31 0701 - 11/01 0700 11/01 0701 - 11/02 0700   I.V. (mL/kg) 600 (7.4)    Total Intake(mL/kg) 600 (7.4)    Urine (mL/kg/hr) 300    Blood 150    Total Output 450     Net +150          Urine Occurrence 1 x      Physical Exam: General: NAD.  Supine in  bed.  Somnolent, but responds appropriately to questions. Resp: No increased wob Cardio: regular rate and rhythm ABD soft Neurologically intact MSK Right arm in sling Hand warm,  Moves fingers spontaneously.  Expected reduction in grip strength. Sensation intact distally Incision: dressing C/D/I  Albina BilletHenry Calvin Martensen III 11/11/2015, 7:57 AM

## 2015-11-12 ENCOUNTER — Encounter (HOSPITAL_COMMUNITY): Payer: Self-pay | Admitting: Orthopedic Surgery

## 2015-11-12 DIAGNOSIS — S42201A Unspecified fracture of upper end of right humerus, initial encounter for closed fracture: Secondary | ICD-10-CM | POA: Diagnosis not present

## 2015-11-12 MED ORDER — OXYMORPHONE HCL ER 10 MG PO T12A
20.0000 mg | EXTENDED_RELEASE_TABLET | Freq: Once | ORAL | Status: AC
  Administered 2015-11-12: 20 mg via ORAL
  Filled 2015-11-12: qty 2

## 2015-11-12 MED ORDER — ALPRAZOLAM 2 MG PO TABS: ORAL_TABLET | ORAL | 0 refills | Status: DC

## 2015-11-12 MED ORDER — HYDROCODONE-ACETAMINOPHEN 5-325 MG PO TABS: 1.0000 | ORAL_TABLET | Freq: Four times a day (QID) | ORAL | 0 refills | Status: AC | PRN

## 2015-11-12 NOTE — Progress Notes (Signed)
[facility].  Report called and given to [facility] LLCBri, Charity fundraiserN. IV removed.  Pt had medications stored at pharmacy.  Medication returned to pt and form signed.  Pt refused her copy of the form. Forms placed in chart. Pt belongings with her.

## 2015-11-12 NOTE — Discharge Summary (Signed)
Discharge Summary  Patient ID: Valerie Silva MRN: [id number] DOB/AGE: 05/28/1960 55 y.o.  Admit date: 11/10/2015 Discharge date: 11/12/2015  Admission Diagnoses:  Humerus head fracture, right, with malunion, subsequent encounter  Discharge Diagnoses:  Principal Problem:   Humerus head fracture, right, with malunion, subsequent encounter Active Problems:   Hypothyroidism   Arthropathy   Fibromyalgia   OSA (obstructive sleep apnea)   Vitamin B 12 deficiency   SIADH (syndrome of inappropriate ADH production) (HCC)   Bipolar I disorder (HCC)   Chronic hyponatremia   Chronic narcotic use   Back pain   Chronic pain associated with significant psychosocial dysfunction   Proximal humerus fracture   Past Medical History:  Diagnosis Date  . Anemia, iron deficiency 04/06/2011  . Anxiety   . Arthritis   . Common migraine with intractable migraine 06/12/2014  . Depression   . Dyspnea    occasional - thinks it's due to back pain  . Exogenous obesity   . Fibromyalgia   . Gait disorder 06/12/2014  . GERD (gastroesophageal reflux disease)   . GIB (gastrointestinal bleeding)   . H/O gastric bypass   . H/O polycystic ovarian syndrome    Took Metformin at that time - Pt does not have diabetes.  . Hypoglycemia   . Hypoglycemia   . Hyponatremia   . Hypotension   . Hypothyroidism   . Iron deficiency anemia due to chronic blood loss 01/15/2014  . Iron deficiency anemia, unspecified 12/12/2012  . Osteoporosis   . Pernicious anemia 04/06/2011  . Pneumonia   . Rhinitis   . Seizures (HCC)    questionable one time - passed out, someone did chest compressions  . Sleep apnea    does not use cpap   . Somnolence   . Varicose veins of both lower extremities without ulcer or inflammation   . Vitamin D deficiency     Surgeries: Procedure(s): OPEN REDUCTION INTERNAL FIXATION (ORIF) PROXIMAL HUMERUS FRACTURE on 11/10/2015   Consultants (if any):   Discharged Condition:  Improved  Progress  Subjective: Feeling good.  Agrees with plan for short term SNF prior to home, then HHPT, HHOT, HH Nurse Aid. OOB.  Pain controlled with PO meds.  Tolerating diet.  Urinating.  No CP, SOB.  Objective: General: NAD.  Upright in bed Resp: No increased WOB Cardio: regular rate and rhythm ABD soft Neurologically intact MSK Neurovascularly intact Sensation intact distally Intact pulses distally Dorsiflexion/Plantar flexion intact Incision: dressing C/D/I  Plan: Weight Bearing: Non Weight Bearing (NWB) right arm Dressings: reinforce prn. Mepilex, abd, tape. VTE prophylaxis: ambulation, SCDs Dispo: Skilled Nursing Facility/Rehab Today.  Hospital Course: Valerie Silva is an 55 y.o. female who was admitted 11/10/2015 with a diagnosis of Humerus head fracture, right, with malunion, subsequent encounter and went to the operating room on 11/10/2015 and underwent the above named procedures.  Hospital stay was protracted dt difficult pain control with history of chronic pain.  She was given perioperative antibiotics:  Anti-infectives    Start     Dose/Rate Route Frequency Ordered Stop   11/10/15 1107  vancomycin (VANCOCIN) 1-5 GM/200ML-% IVPB    Comments:  [name]   : cabinet override      11/10/15 1107 11/10/15 1310   11/10/15 1104  vancomycin (VANCOCIN) IVPB 1000 mg/200 mL premix     1,000 mg 200 mL/hr over 60 Minutes Intravenous On call to O.R. 11/10/15 1104 11/10/15 1410    .  She was given sequential compression devices, early ambulation,  and lovenox for DVT prophylaxis.  She benefited maximally from the hospital stay and there were no complications.    Recent vital signs:  Vitals:   11/11/15 2126 11/12/15 0437  BP: 116/78 94/62  Pulse: 77 69  Resp: 17   Temp: 98.6 F (37 C) 97.8 F (36.6 C)    Recent laboratory studies:  Lab Results  Component Value Date   HGB 11.3 (L) 11/10/2015   HGB 13.0 09/03/2015   HGB 11.7 05/12/2015   Lab  Results  Component Value Date   WBC 3.3 (L) 11/10/2015   PLT 154 11/10/2015   Lab Results  Component Value Date   INR 1.09 05/13/2014   Lab Results  Component Value Date   NA 134 (L) 11/10/2015   K 3.8 11/10/2015   CL 106 11/10/2015   CO2 20 (L) 11/10/2015   BUN 18 11/10/2015   CREATININE 0.67 11/10/2015   GLUCOSE 90 11/10/2015    Discharge Medications:     Medication List    TAKE these medications   acarbose 25 MG tablet Commonly known as:  PRECOSE Take 25-75 mg by mouth daily as needed (for low blood sugar).   ALLERGY EYE 0.025-0.3 % ophthalmic solution Generic drug:  naphazoline-pheniramine Place 1-2 drops into both eyes 4 (four) times daily as needed for allergies.   alprazolam 2 MG tablet Commonly known as:  XANAX TAKE TWO TABLETS BY MOUTH TWICE DAILY AS NEEDED FOR ANXIETY AND/OR  SLEEP   diclofenac 75 MG EC tablet Commonly known as:  VOLTAREN Take 75 mg by mouth 2 (two) times daily.   DSS 100 MG Caps Take 100 mg by mouth 2 (two) times daily. What changed:  how much to take  when to take this   furosemide 20 MG tablet Commonly known as:  LASIX Take 20-40 mg by mouth daily.   HYDROcodone-acetaminophen 5-325 MG tablet Commonly known as:  NORCO Take 1-2 tablets by mouth every 6 (six) hours as needed for moderate pain. For breakthrough pain only.  Take regularly prescribed pain medicines as directed by prescriber.   ibuprofen 800 MG tablet Commonly known as:  ADVIL,MOTRIN Take 800 mg by mouth every 8 (eight) hours as needed for moderate pain.   ICY HOT 5 % Ptch Generic drug:  Menthol Place 1 patch onto the skin every 12 (twelve) hours as needed (for pain).   KLOR-CON M20 20 MEQ tablet Generic drug:  potassium chloride SA Take 20 mEq by mouth daily.   levothyroxine 125 MCG tablet Commonly known as:  SYNTHROID, LEVOTHROID Take 250 mcg by mouth daily.   loratadine 10 MG tablet Commonly known as:  CLARITIN Take 1 tablet (10 mg total) by mouth 2  (two) times daily.   Magnesium 250 MG Tabs Take 250 mg by mouth daily.   methocarbamol 500 MG tablet Commonly known as:  ROBAXIN Take 1 tablet (500 mg total) by mouth every 6 (six) hours as needed for muscle spasms.   modafinil 200 MG tablet Commonly known as:  PROVIGIL Take 2 tablets (400 mg total) by mouth every morning.   mometasone 50 MCG/ACT nasal spray Commonly known as:  NASONEX Place 2 sprays into the nose daily.   NAIL STRENGTHENER/GELATIN Caps Take 1 capsule by mouth daily.   omeprazole 40 MG capsule Commonly known as:  PRILOSEC Take 1 capsule (40 mg total) by mouth daily.   ondansetron 4 MG tablet Commonly known as:  ZOFRAN Take 1 tablet (4 mg total) by mouth every 8 (eight)  hours as needed for nausea or vomiting.   Oxycodone HCl 10 MG Tabs Take 10 mg by mouth 4 (four) times daily.   oxymorphone 20 MG 12 hr tablet Commonly known as:  OPANA ER Take 20 mg by mouth every 12 (twelve) hours.   PARoxetine 20 MG tablet Commonly known as:  PAXIL Take 20 mg by mouth 2 (two) times daily.   promethazine 25 MG tablet Commonly known as:  PHENERGAN Take 25 mg by mouth every 6 (six) hours as needed for nausea.   SUMAtriptan 100 MG tablet Commonly known as:  IMITREX Take 1 tablet (100 mg total) by mouth every 2 (two) hours as needed for migraine.   tiZANidine 4 MG tablet Commonly known as:  ZANAFLEX Take 4 mg by mouth every 6 (six) hours as needed for muscle spasms.   topiramate 100 MG tablet Commonly known as:  TOPAMAX Take 100 mg by mouth 2 (two) times daily.   valACYclovir 1000 MG tablet Commonly known as:  VALTREX Take 1,000 mg by mouth daily as needed (for fever blister flare ups).   Vitamin D-3 5000 UNITS Tabs Take 10,000 Units by mouth daily.       Diagnostic Studies: Dg Shoulder Right  Result Date: 11/10/2015 CLINICAL DATA:  Open reduction internal fixation for proximal humerus fracture EXAM: DG C-ARM 61-120 MIN; RIGHT SHOULDER - 2+ VIEW  COMPARISON:  CT right shoulder November 04, 2015 FLUOROSCOPY TIME:  0 minutes 33 seconds; 2 acquired images FINDINGS: Frontal and attempted Y scapular images show screw and plate fixation through a comminuted fracture of the proximal humeral metaphysis on the right. Alignment is near anatomic at fracture site. Screw tips are in the proximal right humeral head. No dislocation. No new fracture. IMPRESSION: Alignment near anatomic at fracture site.  No dislocation. Electronically Signed   By: Bretta Bang III M.D.   On: 11/10/2015 14:52   Ct Shoulder Right Wo Contrast  Result Date: 11/05/2015 CLINICAL DATA:  Right shoulder dislocation status post fall July 29, 2015. EXAM: CT OF THE RIGHT SHOULDER WITHOUT CONTRAST TECHNIQUE: Multidetector CT imaging was performed according to the standard protocol. Multiplanar CT image reconstructions were also generated. COMPARISON:  None. FINDINGS: Bones/Joint/Cartilage Ununited comminuted fracture of the surgical neck of the right proximal humerus without significant surrounding callus formation. 8 mm medial displacement. No significant angulation. The fracture margin of the humeral head demonstrates cortication at the fracture site. No other fracture or dislocation. No significant glenohumeral joint effusion. Mild degenerative changes of the acromioclavicular joint. Type II acromion. Anterior cervical fusion from C4 through C7. No significant osseous bridging across the C4-5 disc space consistent with pseudoarthrosis. Ligaments Ligaments are suboptimally evaluated by CT. Muscles and Tendons The muscles are normal. No significant muscle atrophy of the rotator cuff muscles. Soft tissue No fluid collection or hematoma. No soft tissue mass. Visualize right lung is clear. IMPRESSION: 1. Ununited comminuted fracture of the surgical neck of the right proximal humerus. No significant callus formation. Electronically Signed   By: Elige Ko   On: 11/05/2015 08:16   Dg Shoulder  Right Port  Result Date: 11/10/2015 CLINICAL DATA:  Humeral fracture with nonunion, postop EXAM: PORTABLE RIGHT SHOULDER COMPARISON:  Portable exam 1602 hours compared to 11/10/2015 intraoperative images. FINDINGS: Plate and multiple screws/pegs at proximal RIGHT humerus post ORIF of a fracture at the surgical neck. Diffuse osseous demineralization. No dislocation. AC joint alignment normal. IMPRESSION: Post ORIF proximal RIGHT humerus. Electronically Signed   By: Angelyn Punt.D.  On: 11/10/2015 16:13   Dg C-arm 1-60 Min  Result Date: 11/10/2015 CLINICAL DATA:  Open reduction internal fixation for proximal humerus fracture EXAM: DG C-ARM 61-120 MIN; RIGHT SHOULDER - 2+ VIEW COMPARISON:  CT right shoulder November 04, 2015 FLUOROSCOPY TIME:  0 minutes 33 seconds; 2 acquired images FINDINGS: Frontal and attempted Y scapular images show screw and plate fixation through a comminuted fracture of the proximal humeral metaphysis on the right. Alignment is near anatomic at fracture site. Screw tips are in the proximal right humeral head. No dislocation. No new fracture. IMPRESSION: Alignment near anatomic at fracture site.  No dislocation. Electronically Signed   By: Bretta BangWilliam  Woodruff III M.D.   On: 11/10/2015 14:52    Disposition: 01-Home or Self Care    Follow-up Information    MURPHY, TIMOTHY D, MD Follow up in 10 day(s).   Specialty:  Orthopedic Surgery Contact information: [address] [place] [address] [phone]           Signed: Albina BilletHenry Calvin Martensen III PA-C 11/12/2015, 7:21 AM

## 2015-11-12 NOTE — [place] FL2 (Signed)
[place] MEDICAID FL2 LEVEL OF CARE SCREENING TOOL     IDENTIFICATION  Patient Name: Valerie Silva Birthdate: [birth year] Sex: female Admission Date (Current Location): 11/10/2015  [facility] [place]  [employer]   Facility and Address:  [facility], [address], [place], [place] [address]      Provider Number: [id number]  Attending Physician Name and Address:  Sheral Apleyimothy D Murphy, MD  Relative Name and Phone Number:       Current Level of Care: SNF Recommended Level of Care: Skilled Nursing Facility Prior Approval Number:    Date Approved/Denied:   PASRR Number: [id number]  Discharge Plan: SNF    Current Diagnoses: Patient Active Problem List   Diagnosis Date Noted  . Proximal humerus fracture 11/11/2015  . Humerus head fracture, right, with malunion, subsequent encounter 11/10/2015  . Common migraine with intractable migraine 06/12/2014  . Gait disorder 06/12/2014  . Lumbar compression fracture (HCC) 05/13/2014  . Fall 05/13/2014  . Diabetes mellitus without complication (HCC) 05/13/2014  . GERD (gastroesophageal reflux disease) 05/13/2014  . Back pain 05/13/2014  . Chronic narcotic use 04/14/2014  . Polydipsia 04/14/2014  . Acute hyponatremia 04/14/2014  . Chronic hyponatremia 04/11/2014  . Syncope and collapse 04/11/2014  . Chronic pain 04/11/2014  . Iron deficiency anemia due to chronic blood loss 01/15/2014  . Bipolar I disorder (HCC) 11/11/2013  . SIADH (syndrome of inappropriate ADH production) (HCC) 10/06/2013  . Intentional benzodiazepine overdose (HCC) 07/09/2012  . Acute encephalopathy 07/07/2012  . Pain in thoracic spine 05/28/2012  . Pain in shoulder 05/28/2012  . Chronic pain associated with significant psychosocial dysfunction 05/28/2012  . Gonalgia 05/28/2012  . Hypoglycemia 09/20/2011  . Abnormal LFTs 09/20/2011  . Vitamin B 12 deficiency 07/19/2011  . Pernicious anemia 04/06/2011  . Hypothyroidism  01/10/2008  . EXOGENOUS OBESITY 01/10/2008  . Arthropathy 01/10/2008  . Fibromyalgia 01/10/2008  . OSA (obstructive sleep apnea) 01/10/2008    Orientation RESPIRATION BLADDER Height & Weight     Self, Time, Situation, Place  Normal Continent Weight: 180 lb (81.6 kg) Height:  5\' 8"  (172.7 cm)  BEHAVIORAL SYMPTOMS/MOOD NEUROLOGICAL BOWEL NUTRITION STATUS      Continent Diet  AMBULATORY STATUS COMMUNICATION OF NEEDS Skin   Independent Verbally Surgical wounds (shoulder)                       Personal Care Assistance Level of Assistance  Bathing, Dressing Bathing Assistance: Limited assistance   Dressing Assistance: Limited assistance     Functional Limitations Info             SPECIAL CARE FACTORS FREQUENCY  PT (By licensed PT), OT (By licensed OT)     PT Frequency: daily OT Frequency: daily            Contractures Contractures Info: Not present    Additional Factors Info  Allergies   Allergies Info: Penicillins, Ambien Zolpidem Tartrate, Benazepril Hcl, Codeine, Iodine, Iron, Lovaza Omega-3-acid Ethyl Esters, Soma Carisoprodol, Sulfonamide Derivatives, Abilify Aripiprazole, Cymbalta Duloxetine Hcl, Lunesta Eszopiclone, Lyrica Pregabalin, Sulfa Antibiotics, Amitriptyline           Current Medications (11/12/2015):  This is the current hospital active medication list Current Facility-Administered Medications  Medication Dose Route Frequency Provider Last Rate Last Dose  . acetaminophen (TYLENOL) tablet 650 mg  650 mg Oral Q6H PRN Lucretia KernHenry Calvin Martensen III, PA-C       Or  . acetaminophen (TYLENOL) suppository 650 mg  650 mg Rectal Q6H PRN Lucretia Kern Martensen III, PA-C      . ALPRAZolam Prudy Feeler) tablet 2 mg  2 mg Oral BID PRN Lucretia Kern Martensen III, PA-C   2 mg at 11/11/15 2154  . bisacodyl (DULCOLAX) suppository 10 mg  10 mg Rectal Daily PRN Lucretia Kern Martensen III, PA-C      . cholecalciferol (VITAMIN D) tablet 10,000 Units  10,000 Units Oral  Daily Lucretia Kern Martensen III, PA-C   10,000 Units at 11/11/15 1024  . docusate sodium (COLACE) capsule 200-400 mg  200-400 mg Oral QHS Sheral Apley, MD   200 mg at 11/11/15 2154  . enoxaparin (LOVENOX) injection 40 mg  40 mg Subcutaneous Q24H Lucretia Kern Martensen III, PA-C   40 mg at 11/12/15 0829  . fentaNYL (SUBLIMAZE) injection 25-50 mcg  25-50 mcg Intravenous Q5 min PRN Judie Petit, MD   25 mcg at 11/10/15 2254  . furosemide (LASIX) tablet 20 mg  20 mg Oral Daily Lucretia Kern Martensen III, PA-C   20 mg at 11/11/15 1024  . HYDROcodone-acetaminophen (NORCO/VICODIN) 5-325 MG per tablet 1-2 tablet  1-2 tablet Oral Q4H PRN Albina Billet III, PA-C   2 tablet at 11/12/15 0152  . lactated ringers infusion   Intravenous Continuous Lucretia Kern Martensen III, PA-C 100 mL/hr at 11/10/15 2302    . levothyroxine (SYNTHROID, LEVOTHROID) tablet 250 mcg  250 mcg Oral QAC breakfast Lucretia Kern Martensen III, PA-C   250 mcg at 11/12/15 1610  . methocarbamol (ROBAXIN) tablet 500 mg  500 mg Oral Q6H PRN Lucretia Kern Martensen III, PA-C   500 mg at 11/12/15 9604   Or  . methocarbamol (ROBAXIN) 500 mg in dextrose 5 % 50 mL IVPB  500 mg Intravenous Q6H PRN Lucretia Kern Martensen III, PA-C      . metoCLOPramide (REGLAN) tablet 5-10 mg  5-10 mg Oral Q8H PRN Lucretia Kern Martensen III, PA-C       Or  . metoCLOPramide (REGLAN) injection 5-10 mg  5-10 mg Intravenous Q8H PRN Lucretia Kern Martensen III, PA-C      . modafinil (PROVIGIL) tablet 400 mg  400 mg Oral q morning - 10a Lucretia Kern Martensen III, PA-C   400 mg at 11/11/15 1024  . morphine (MS CONTIN) 12 hr tablet 60 mg  60 mg Oral Q12H Lucretia Kern Martensen III, PA-C   60 mg at 11/12/15 1030  . ondansetron (ZOFRAN) tablet 4 mg  4 mg Oral Q6H PRN Lucretia Kern Martensen III, PA-C       Or  . ondansetron [facility]) injection 4 mg  4 mg Intravenous Q6H PRN Lucretia Kern Martensen III, PA-C      . oxyCODONE (Oxy IR/ROXICODONE) immediate release  tablet 10 mg  10 mg Oral Q6H PRN Lucretia Kern Martensen III, PA-C   10 mg at 11/12/15 0831  . pantoprazole (PROTONIX) EC tablet 40 mg  40 mg Oral Daily Lucretia Kern Martensen III, PA-C   40 mg at 11/12/15 1029  . PARoxetine (PAXIL) tablet 20 mg  20 mg Oral BID Lucretia Kern Martensen III, PA-C   20 mg at 11/12/15 1030  . polyethylene glycol (MIRALAX / GLYCOLAX) packet 17 g  17 g Oral Daily PRN Lucretia Kern Martensen III, PA-C      . promethazine (PHENERGAN) injection 6.25-12.5 mg  6.25-12.5 mg Intravenous Q15 min PRN Judie Petit, MD      . senna (SENOKOT) tablet 8.6 mg  1 tablet Oral BID Lucretia Kern  Martensen III, PA-C   8.6 mg at 11/12/15 1028  . SUMAtriptan (IMITREX) tablet 100 mg  100 mg Oral Q2H PRN Lucretia KernHenry Calvin Martensen III, PA-C   100 mg at 11/12/15 0140  . topiramate (TOPAMAX) tablet 100 mg  100 mg Oral BID Lucretia KernHenry Calvin Martensen III, PA-C   100 mg at 11/12/15 1028     Discharge Medications: Please see discharge summary for a list of discharge medications.  Relevant Imaging Results:  Relevant Lab Results:   Additional Information SSN [id number]  Rondel Batonngle, Courtlyn Aki C, LCSW

## 2015-11-12 NOTE — Clinical Social Work Placement (Signed)
   CLINICAL SOCIAL WORK PLACEMENT  NOTE  Date:  11/12/2015  Patient Details  Name: Valerie HongLaura J Bamberg MRN: [id number] Date of Birth: Mar 30, 1960  Clinical Social Work is seeking post-discharge placement for this patient at the Skilled  Nursing Facility level of care (*CSW will initial, date and re-position this form in  chart as items are completed):  Yes   Patient/family provided with [facility] Clinical Social Work Department's list of facilities offering this level of care within the geographic area requested by the patient (or if unable, by the patient's family).  Yes   Patient/family informed of their freedom to choose among providers that offer the needed level of care, that participate in Medicare, Medicaid or managed care program needed by the patient, have an available bed and are willing to accept the patient.  Yes   Patient/family informed of [facility]'s ownership interest in [facility] and [facility], as well as of the fact that they are under no obligation to receive care at these facilities.  PASRR submitted to EDS on       PASRR number received on       Existing PASRR number confirmed on 11/12/15     FL2 transmitted to all facilities in geographic area requested by pt/family on 11/12/15     FL2 transmitted to all facilities within larger geographic area on       Patient informed that his/her managed care company has contracts with or will negotiate with certain facilities, including the following:        Yes   Patient/family informed of bed offers received.  Patient chooses bed at       Physician recommends and patient chooses bed at      Patient to be transferred to   on 11/12/15.  Patient to be transferred to facility by [facility]     Patient family notified on 11/12/15 of transfer.  Name of family member notified:  patient alert and oriented x4 and states will contact family as necessary     PHYSICIAN Please prepare priority discharge summary,  including medications     Additional Comment:    _______________________________________________ Rondel BatonIngle, Xiao Graul C, LCSW 11/12/2015, 10:47 AM

## 2015-11-12 NOTE — Progress Notes (Signed)
Pt was not able to get anyone to pick up home medications.  Pt agreeable to have home medications stored at pharmacy.  Medications taken to pharmacy with appropriate form.  Pt aware that medications will be returned to her at discharge.

## 2015-11-12 NOTE — Progress Notes (Signed)
Late entry for missed gcode for 11/11/15:   11/12/15 1400  PT G-Codes **NOT FOR INPATIENT CLASS**  Functional Assessment Tool Used clinical judgment  Functional Limitation Mobility: Walking and moving around  Mobility: Walking and Moving Around Current Status [phone](G8978) CI  Mobility: Walking and Moving Around Goal Status [phone](G8979) CI     11/12/15 1400  PT G-Codes **NOT FOR INPATIENT CLASS**  Functional Assessment Tool Used clinical judgment  Functional Limitation Mobility: Walking and moving around  Mobility: Walking and Moving Around Current Status [phone](G8978) CI  Mobility: Walking and Moving Around Goal Status [phone](G8979) CI  [facility] Arien Benincasa,PT Acute Rehabilitation [phone] (pager)

## 2015-11-12 NOTE — Progress Notes (Addendum)
Occupational Therapy Treatment Patient Details Name: Valerie HongLaura J Silva MRN: [id number] DOB: 21-Dec-1960 Today's Date: 11/12/2015    History of present illness Valerie Silva is a  55 y.o. female with a diagnosis of NON-UNION RT PROXIMAL HUMERUS FRACTURE who elected for surgical management.     OT comments  Pt progressing toward OT goals. Continues to require moderate assist for UB ADL while adhering to R UE precautions and min guard assist for safety for functional mobility. Pt plans to D/C to SNF this date for continued rehabilitation services to improve independence with ADL and functional mobility prior to returning home. Continue to recommend SNF for D/C plan as pt lives alone and will not have necessary assistance at this time. OT will continue to follow acutely.   Follow Up Recommendations  SNF;Supervision/Assistance - 24 hour    Equipment Recommendations  Other (comment) (TBD at next venue of care)       Precautions / Restrictions Precautions Precautions: Fall Required Braces or Orthoses: Sling (R shoulder ) Restrictions Weight Bearing Restrictions: Yes RUE Weight Bearing: Non weight bearing       Mobility Bed Mobility Overal bed mobility: Needs Assistance Bed Mobility: Supine to Sit;Sit to Supine     Supine to sit: Supervision Sit to supine: Supervision   General bed mobility comments: VC's for technique  Transfers Overall transfer level: Needs assistance Equipment used: None Transfers: Sit to/from Stand Sit to Stand: Min guard              Balance Overall balance assessment: Needs assistance;History of Falls Sitting-balance support: No upper extremity supported;Feet supported Sitting balance-Leahy Scale: Good     Standing balance support: No upper extremity supported;During functional activity Standing balance-Leahy Scale: Fair                     ADL Overall ADL's : Needs assistance/impaired                 Upper Body Dressing :  Moderate assistance;Sitting;Adhering to UE precautions       Toilet Transfer: RW;Regular Toilet;Min guard   Toileting- Clothing Manipulation and Hygiene: Supervision/safety;Sit to/from stand       Functional mobility during ADLs: Min guard General ADL Comments: Continued education on R UE NWB precautions, sling wear, dressing/bathing techniques, and safe ADL transfers.      Vision                 Additional Comments: Glasses for reading          Cognition   Behavior During Therapy: Flat affect Overall Cognitive Status: No family/caregiver present to determine baseline cognitive functioning                                    Pertinent Vitals/ Pain       Pain Assessment: Faces Faces Pain Scale: Hurts even more Pain Location: R shoulder Pain Descriptors / Indicators: Aching;Grimacing;Guarding;Sore Pain Intervention(s): Limited activity within patient's tolerance;Monitored during session;Repositioned         Frequency  Min 1X/week        Progress Toward Goals  OT Goals(current goals can now be found in the care plan section)  Progress towards OT goals: Progressing toward goals  Acute Rehab OT Goals Patient Stated Goal: to get better OT Goal Formulation: With patient Time For Goal Achievement: 11/25/15 Potential to Achieve Goals: Good ADL Goals Pt Will Perform Upper  Body Bathing: with modified independence;sitting Pt Will Perform Lower Body Bathing: with modified independence;sit to/from stand Pt Will Perform Upper Body Dressing: with modified independence;sitting Pt Will Perform Lower Body Dressing: with modified independence;sit to/from stand Pt Will Transfer to Toilet: with modified independence;ambulating;regular height toilet Pt Will Perform Toileting - Clothing Manipulation and hygiene: with modified independence;sit to/from stand Pt Will Perform Tub/Shower Transfer: with modified independence;ambulating;shower seat  Plan Discharge  plan remains appropriate       End of Session Equipment Utilized During Treatment: Gait belt;Other (comment) (R shoulder sling)   Activity Tolerance Patient tolerated treatment well   Patient Left in bed;with call bell/phone within reach   Nurse Communication      Functional Assessment Tool Used: clinical judgement Functional Limitation: Self care Self Care Current Status ([id number]): At least 20 percent but less than 40 percent impaired, limited or restricted Self Care Goal Status (U0454(G8988): At least 1 percent but less than 20 percent impaired, limited or restricted Self Care Discharge Status [phone](G8989): At least 20 percent but less than 40 percent impaired, limited or restricted   Time: 1229-1300 OT Time Calculation (min): 31 min  Charges: OT G-codes **NOT FOR INPATIENT CLASS** Functional Assessment Tool Used: clinical judgement Functional Limitation: Self care Self Care Current Status ([id number](G8987): At least 20 percent but less than 40 percent impaired, limited or restricted Self Care Goal Status (G9562(G8988): At least 1 percent but less than 20 percent impaired, limited or restricted Self Care Discharge Status [phone](G8989): At least 20 percent but less than 40 percent impaired, limited or restricted OT General Charges $OT Visit: 1 Procedure OT Treatments $Self Care/Home Management : 23-37 mins  Doristine SectionCharity A Therese Rocco, OTR/L [phone] 11/12/2015, 1:30 PM

## 2015-11-13 NOTE — Progress Notes (Addendum)
Late addendum to OT evaluation 11/11/15.   [address]Chrisandra Wiemers [place], [place] [phone]   11/13/15 1300  OT G-codes **NOT FOR INPATIENT CLASS**  Functional Assessment Tool Used clinical judgement  Functional Limitation Self care  Self Care Current Status [phone](G8987) CJ  Self Care Goal Status (W2956(G8988) CI

## 2015-11-18 ENCOUNTER — Encounter (HOSPITAL_COMMUNITY): Payer: Self-pay | Admitting: Orthopedic Surgery

## 2015-11-30 ENCOUNTER — Other Ambulatory Visit: Payer: Self-pay | Admitting: *Deleted

## 2015-11-30 DIAGNOSIS — D500 Iron deficiency anemia secondary to blood loss (chronic): Secondary | ICD-10-CM

## 2015-11-30 DIAGNOSIS — F064 Anxiety disorder due to known physiological condition: Secondary | ICD-10-CM

## 2015-11-30 DIAGNOSIS — D510 Vitamin B12 deficiency anemia due to intrinsic factor deficiency: Secondary | ICD-10-CM

## 2015-11-30 MED ORDER — ALPRAZOLAM 2 MG PO TABS: ORAL_TABLET | ORAL | 0 refills | Status: DC

## 2015-12-01 ENCOUNTER — Other Ambulatory Visit: Payer: Self-pay | Admitting: Hematology & Oncology

## 2015-12-01 DIAGNOSIS — D500 Iron deficiency anemia secondary to blood loss (chronic): Secondary | ICD-10-CM

## 2015-12-01 DIAGNOSIS — D510 Vitamin B12 deficiency anemia due to intrinsic factor deficiency: Secondary | ICD-10-CM

## 2015-12-01 DIAGNOSIS — F064 Anxiety disorder due to known physiological condition: Secondary | ICD-10-CM

## 2015-12-11 ENCOUNTER — Ambulatory Visit: Payer: Medicare Other | Admitting: Hematology & Oncology

## 2015-12-11 ENCOUNTER — Other Ambulatory Visit: Payer: Medicare Other

## 2015-12-11 ENCOUNTER — Ambulatory Visit: Payer: Medicare Other

## 2015-12-25 ENCOUNTER — Ambulatory Visit: Payer: Medicare Other

## 2015-12-25 ENCOUNTER — Ambulatory Visit: Payer: Medicare Other | Admitting: Hematology & Oncology

## 2015-12-25 ENCOUNTER — Other Ambulatory Visit: Payer: Medicare Other

## 2016-01-11 DEATH — deceased

## 2016-10-19 IMAGING — CR DG CHEST 2V
2 series · 2 of 2 positions shown · non-contrast
Comparison: Chest radiograph performed 02/14/2014

CLINICAL DATA: Acute onset of syncope. Status post multiple falls
today. Initial encounter.

EXAM:
CHEST  2 VIEW

[w chest lat]
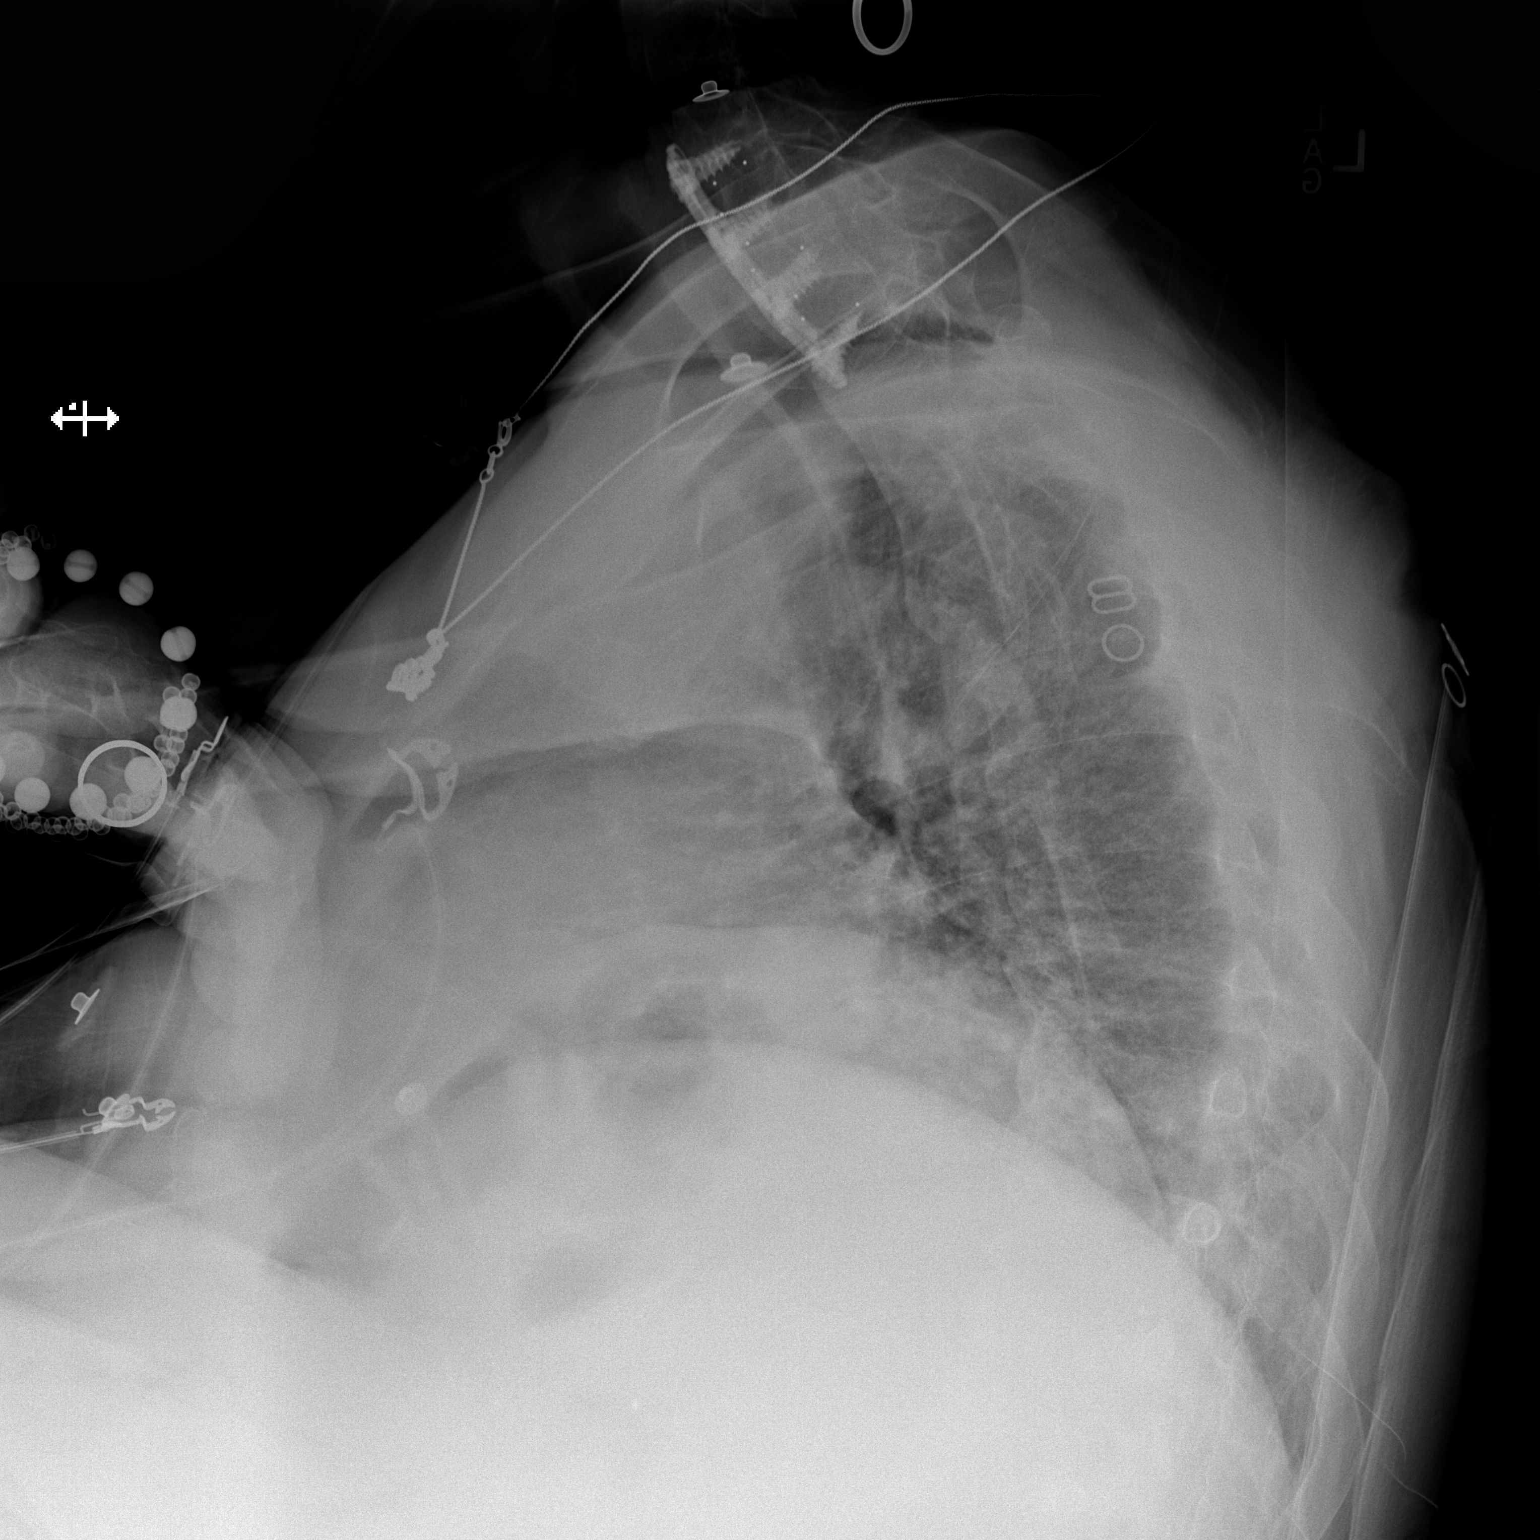

[x chest ap]
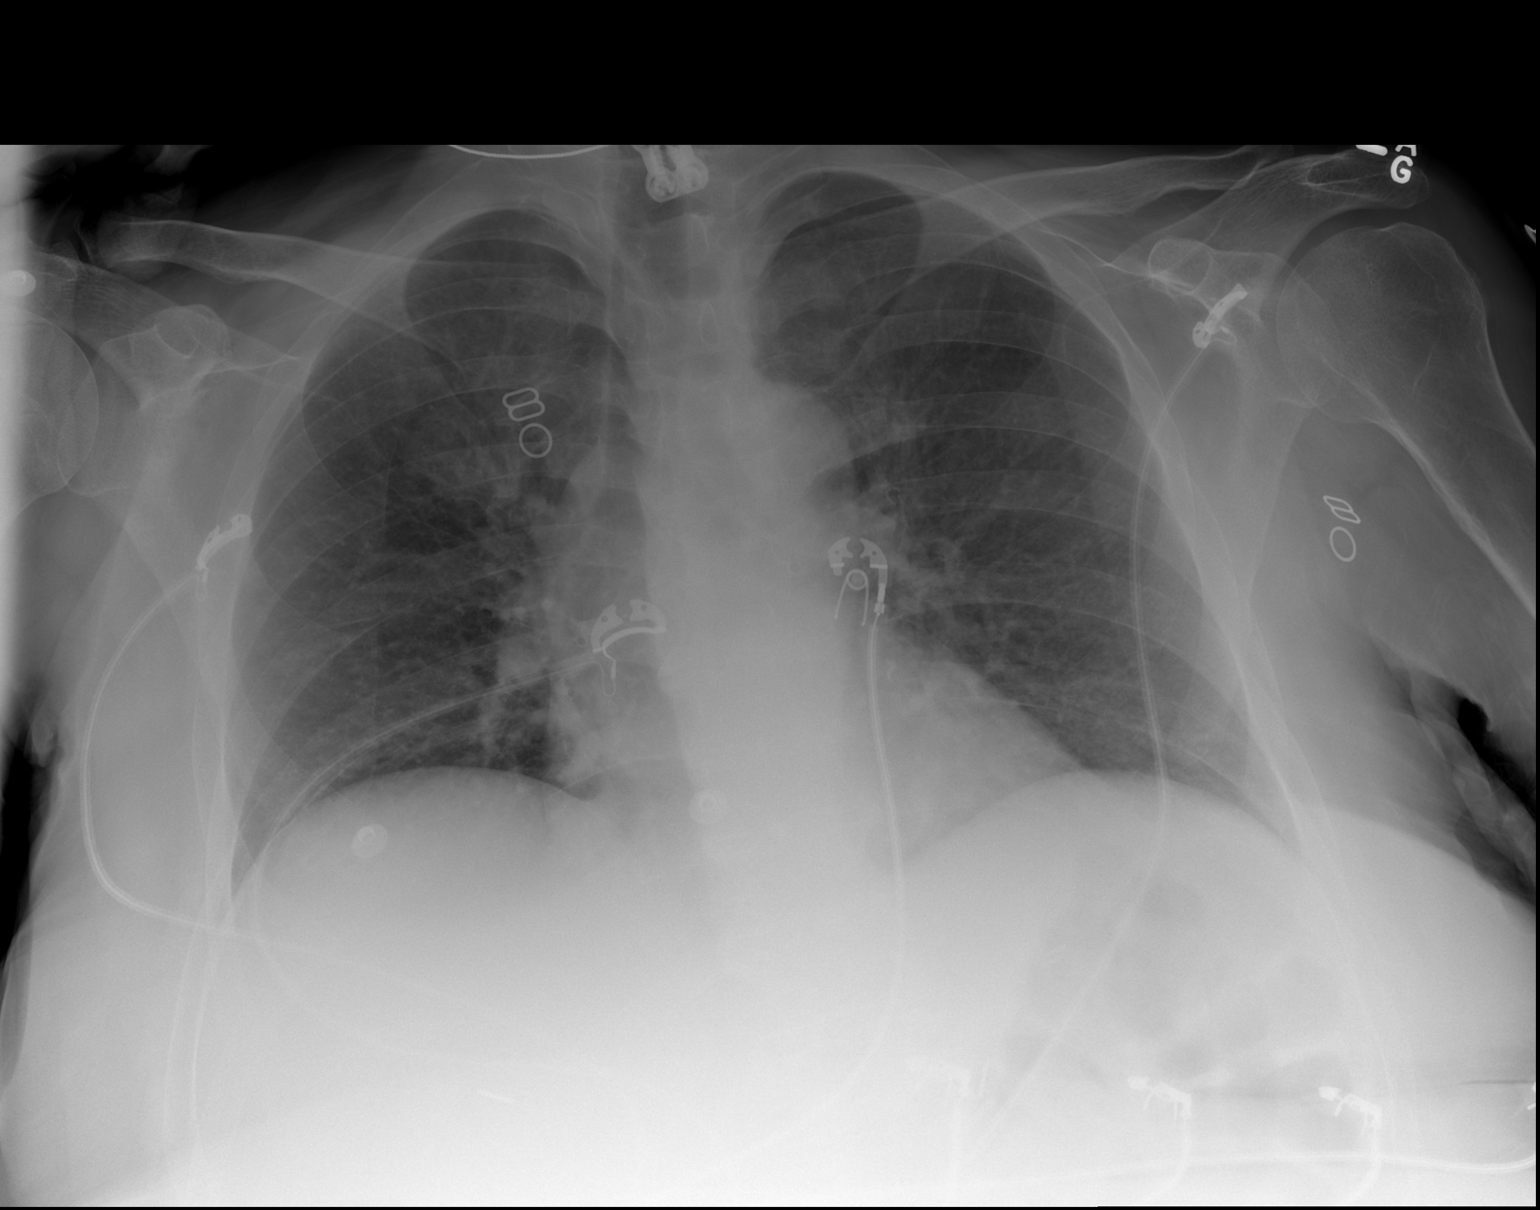

[2 of 2 positions shown; findings below may reference images not displayed]

FINDINGS: The lungs are hypoexpanded. Mild vascular crowding is noted. No
focal consolidation, pleural effusion or pneumothorax is seen.

The cardiomediastinal silhouette is borderline normal in size. No
acute osseous abnormalities are identified. Cervical spinal fusion
hardware is noted.
IMPRESSION: Lungs hypoexpanded but grossly clear. No displaced rib fracture
seen.

## 2016-12-22 IMAGING — CR DG LUMBAR SPINE COMPLETE 4+V
5 series · 5 of 5 positions shown · non-contrast
Comparison: 05/03/2013

CLINICAL DATA: Fell at home earlier this morning, severe low back
pain, RIGHT leg pain, RIGHT anterior side mid rib cage pain, history
osteoporosis

EXAM:
LUMBAR SPINE - COMPLETE 4+ VIEW

[t lumbar spine ap]
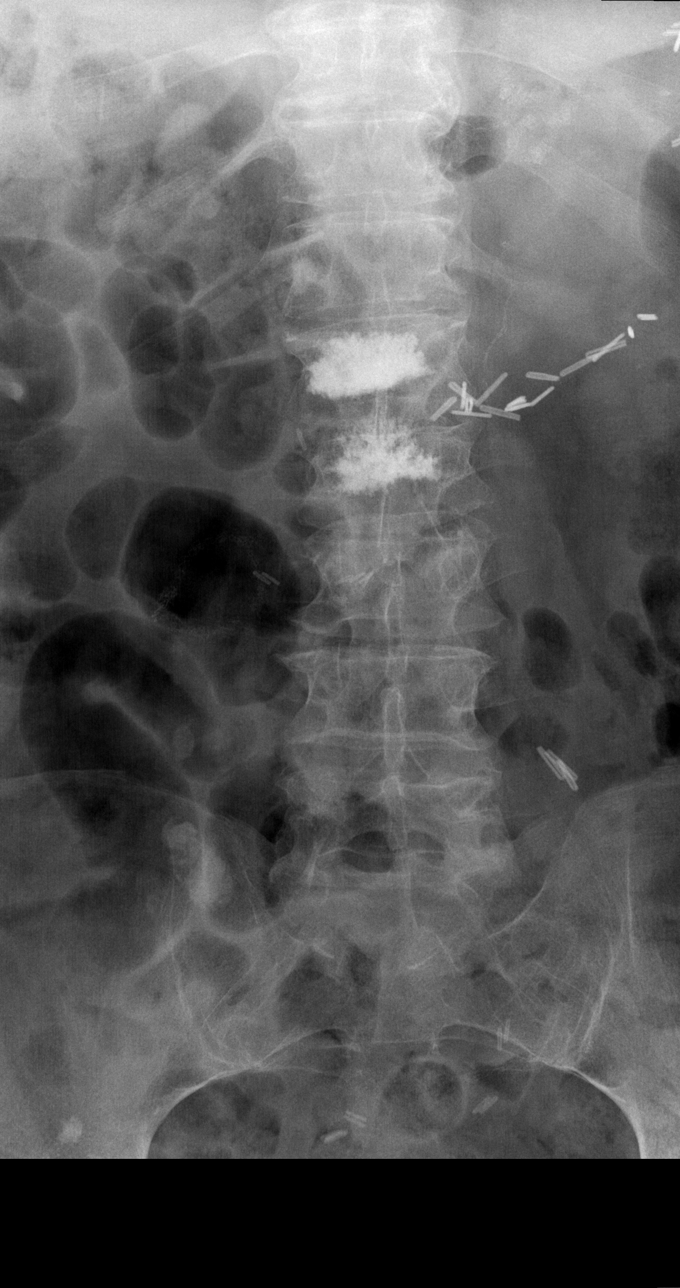

[t lumbar spine obl (1 of 2)]
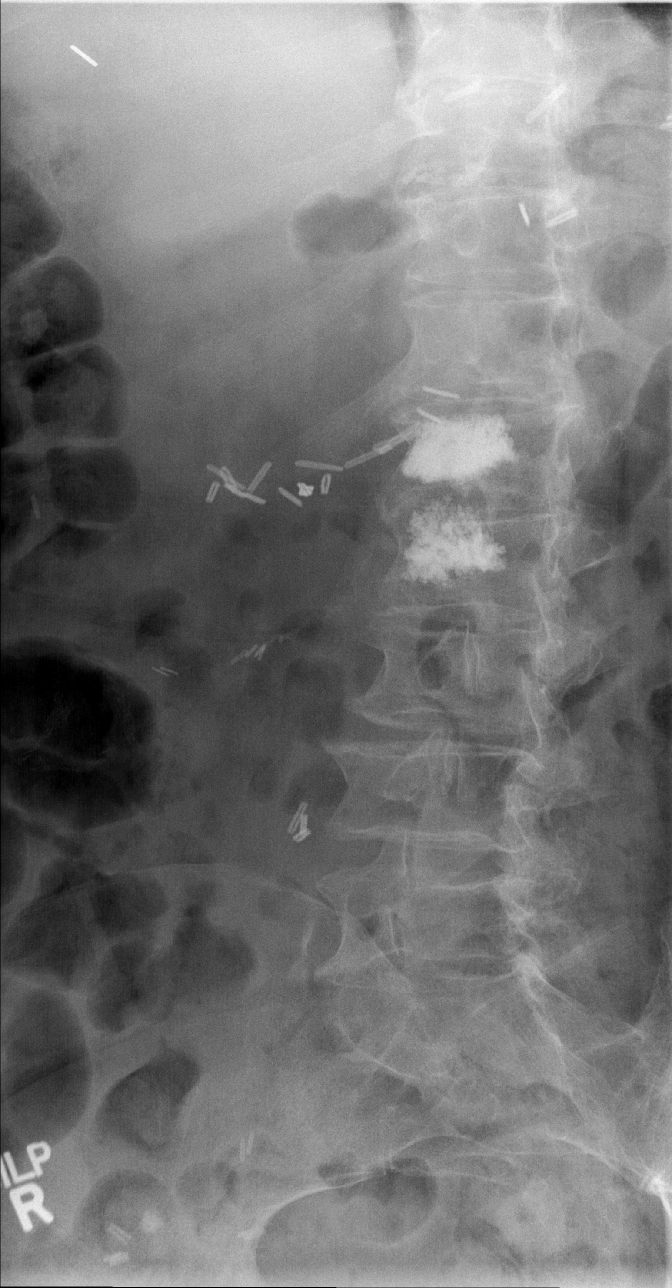

[t lumbar spine obl (2 of 2)]
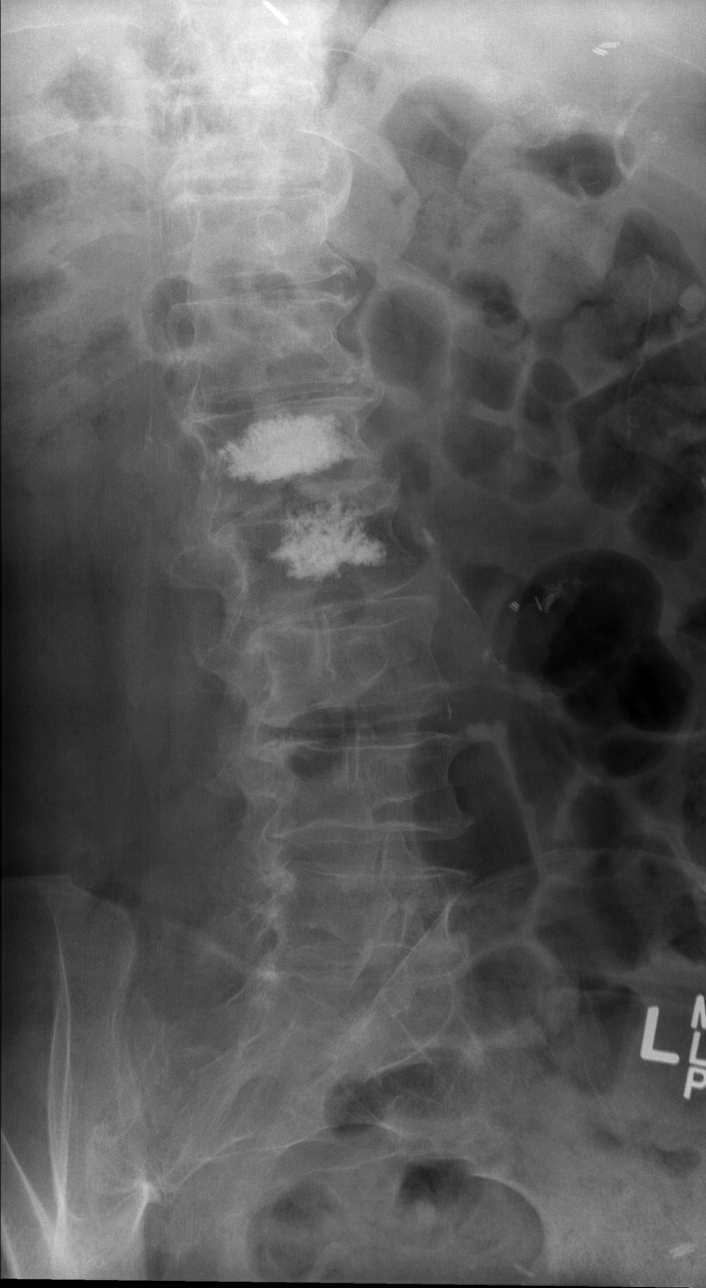

[t lumbar spine lat]
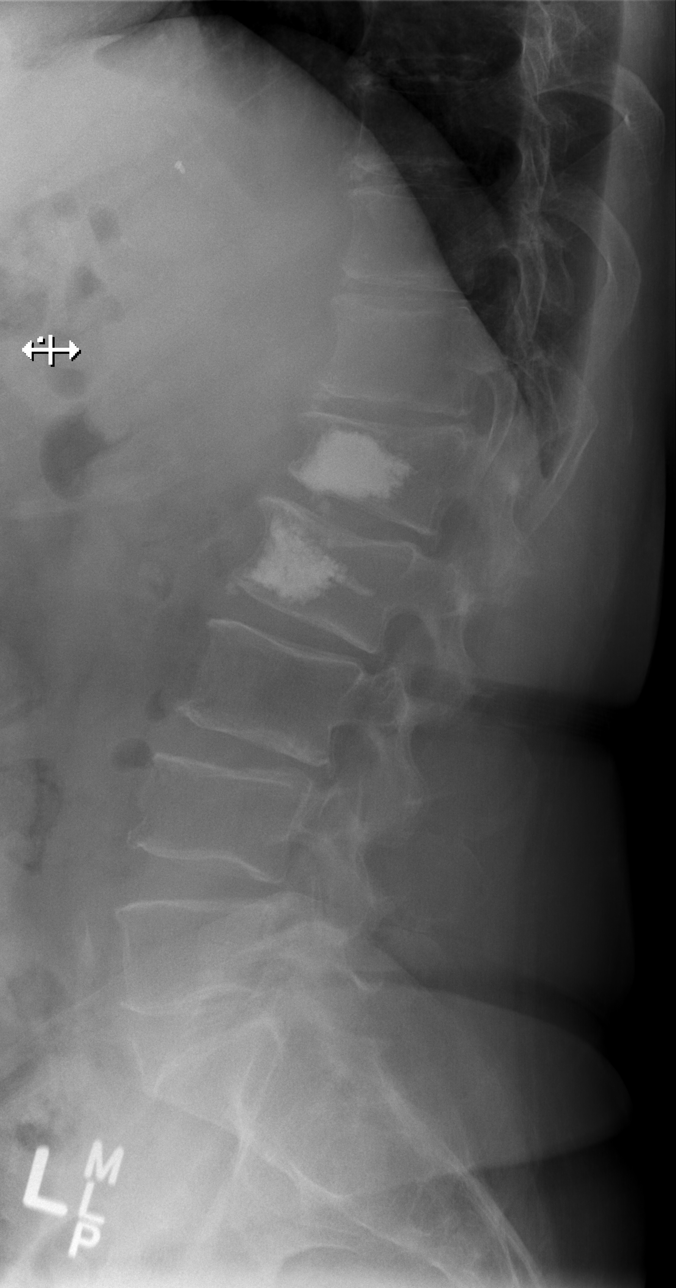

[t lumbar l-5 s-1 spot]
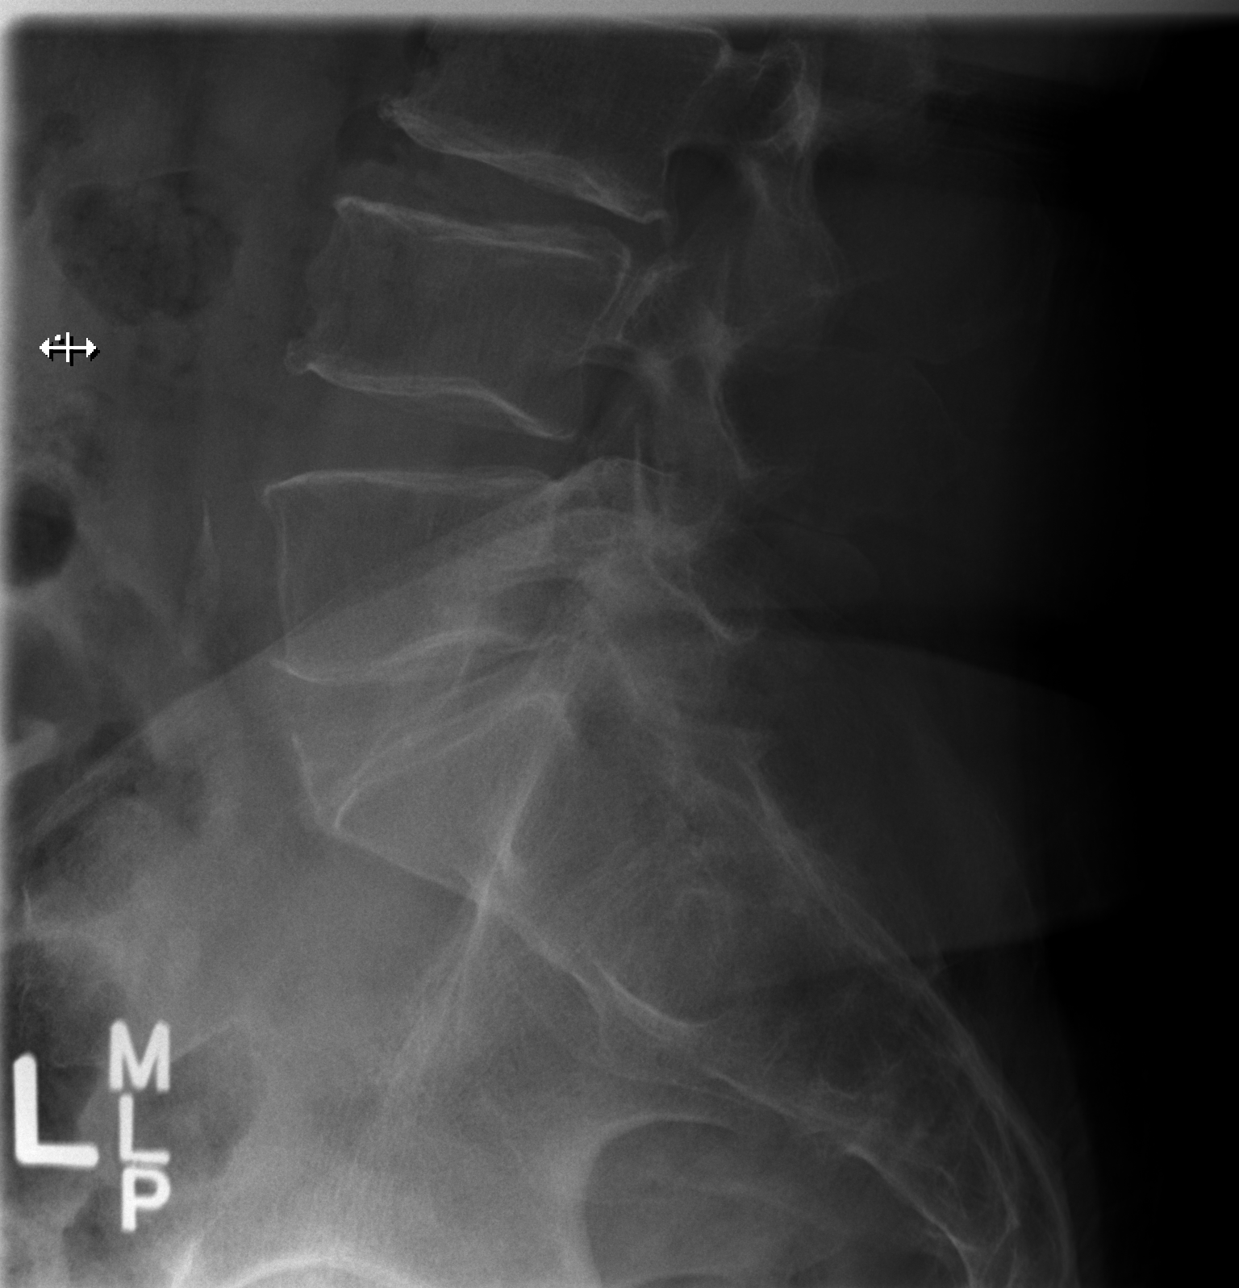

[5 of 5 positions shown; findings below may reference images not displayed]

FINDINGS: Five non-rib-bearing lumbar vertebrae.

Diffuse osseous demineralization.

Prior spinal augmentation procedures at L1 and L2 with mild residual
anterior height loss greater at L1.

Slight disc space narrowing at L2-L3 and L3-L4.

New slight anterior height loss of the L4 vertebral body consistent
with a subtle compression fracture, better appreciated on coned-down
view.

No subluxation, bone destruction or spondylolysis.

SI joints symmetric.

Scattered surgical clips and pelvic phleboliths.
IMPRESSION: Osseous demineralization with prior spinal augmentation procedures
at fractures of L1 and L2.

New subtle compression fracture L4 vertebral body.
